# Patient Record
Sex: Male | Born: 1955 | Race: White | Hispanic: No | Marital: Married | Smoking: Never smoker
Health system: Southern US, Community
[De-identification: ages and names within clinical notes are randomized; demographics above are authoritative.]

## PROBLEM LIST (undated history)

## (undated) DIAGNOSIS — T7840XA Allergy, unspecified, initial encounter: Secondary | ICD-10-CM

## (undated) DIAGNOSIS — C801 Malignant (primary) neoplasm, unspecified: Secondary | ICD-10-CM

## (undated) DIAGNOSIS — E785 Hyperlipidemia, unspecified: Secondary | ICD-10-CM

## (undated) DIAGNOSIS — T8859XA Other complications of anesthesia, initial encounter: Secondary | ICD-10-CM

## (undated) DIAGNOSIS — H9191 Unspecified hearing loss, right ear: Secondary | ICD-10-CM

## (undated) DIAGNOSIS — M5137 Other intervertebral disc degeneration, lumbosacral region: Secondary | ICD-10-CM

## (undated) DIAGNOSIS — K219 Gastro-esophageal reflux disease without esophagitis: Secondary | ICD-10-CM

## (undated) DIAGNOSIS — G8929 Other chronic pain: Secondary | ICD-10-CM

## (undated) DIAGNOSIS — T4145XA Adverse effect of unspecified anesthetic, initial encounter: Secondary | ICD-10-CM

## (undated) DIAGNOSIS — R011 Cardiac murmur, unspecified: Secondary | ICD-10-CM

## (undated) DIAGNOSIS — Z85828 Personal history of other malignant neoplasm of skin: Secondary | ICD-10-CM

## (undated) DIAGNOSIS — M1990 Unspecified osteoarthritis, unspecified site: Secondary | ICD-10-CM

## (undated) DIAGNOSIS — N401 Enlarged prostate with lower urinary tract symptoms: Secondary | ICD-10-CM

## (undated) DIAGNOSIS — H9190 Unspecified hearing loss, unspecified ear: Secondary | ICD-10-CM

## (undated) DIAGNOSIS — I499 Cardiac arrhythmia, unspecified: Secondary | ICD-10-CM

## (undated) DIAGNOSIS — I10 Essential (primary) hypertension: Secondary | ICD-10-CM

## (undated) DIAGNOSIS — M545 Low back pain, unspecified: Secondary | ICD-10-CM

## (undated) DIAGNOSIS — J302 Other seasonal allergic rhinitis: Secondary | ICD-10-CM

## (undated) DIAGNOSIS — E782 Mixed hyperlipidemia: Secondary | ICD-10-CM

## (undated) DIAGNOSIS — Z87442 Personal history of urinary calculi: Secondary | ICD-10-CM

## (undated) DIAGNOSIS — E876 Hypokalemia: Secondary | ICD-10-CM

## (undated) DIAGNOSIS — M2550 Pain in unspecified joint: Secondary | ICD-10-CM

## (undated) DIAGNOSIS — M48062 Spinal stenosis, lumbar region with neurogenic claudication: Secondary | ICD-10-CM

## (undated) DIAGNOSIS — Z7901 Long term (current) use of anticoagulants: Secondary | ICD-10-CM

## (undated) DIAGNOSIS — N1330 Unspecified hydronephrosis: Secondary | ICD-10-CM

## (undated) DIAGNOSIS — M4807 Spinal stenosis, lumbosacral region: Secondary | ICD-10-CM

## (undated) DIAGNOSIS — M51379 Other intervertebral disc degeneration, lumbosacral region without mention of lumbar back pain or lower extremity pain: Secondary | ICD-10-CM

## (undated) HISTORY — DX: Hypokalemia: E87.6

## (undated) HISTORY — PX: LITHOTRIPSY: SUR834

## (undated) HISTORY — PX: JOINT REPLACEMENT: SHX530

## (undated) HISTORY — PX: COLONOSCOPY: SHX5424

## (undated) HISTORY — PX: EXTRACORPOREAL SHOCK WAVE LITHOTRIPSY: SHX1557

## (undated) HISTORY — DX: Spinal stenosis, lumbar region with neurogenic claudication: M48.062

## (undated) HISTORY — PX: LUMBAR DISC SURGERY: SHX700

## (undated) HISTORY — PX: HERNIA REPAIR: SHX51

## (undated) HISTORY — DX: Allergy, unspecified, initial encounter: T78.40XA

## (undated) HISTORY — DX: Hyperlipidemia, unspecified: E78.5

## (undated) HISTORY — DX: Pain in unspecified joint: M25.50

## (undated) HISTORY — PX: BACK SURGERY: SHX140

---
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
## 1990-10-24 HISTORY — PX: UMBILICAL HERNIA REPAIR: SHX196

## 2006-01-02 ENCOUNTER — Other Ambulatory Visit: Payer: Self-pay

## 2006-01-05 ENCOUNTER — Ambulatory Visit: Payer: Self-pay | Admitting: Orthopaedic Surgery

## 2006-09-05 ENCOUNTER — Ambulatory Visit: Payer: Self-pay | Admitting: Family Medicine

## 2006-12-04 ENCOUNTER — Ambulatory Visit: Payer: Self-pay | Admitting: Orthopaedic Surgery

## 2007-03-16 ENCOUNTER — Ambulatory Visit: Payer: Self-pay | Admitting: Family Medicine

## 2007-06-13 ENCOUNTER — Ambulatory Visit: Payer: Self-pay | Admitting: Orthopaedic Surgery

## 2007-07-09 ENCOUNTER — Ambulatory Visit: Payer: Self-pay | Admitting: Orthopaedic Surgery

## 2007-07-12 ENCOUNTER — Ambulatory Visit: Payer: Self-pay | Admitting: Orthopaedic Surgery

## 2007-12-12 ENCOUNTER — Ambulatory Visit: Payer: Self-pay

## 2008-01-02 ENCOUNTER — Other Ambulatory Visit: Payer: Self-pay

## 2008-01-02 ENCOUNTER — Ambulatory Visit: Payer: Self-pay | Admitting: Orthopaedic Surgery

## 2008-01-08 ENCOUNTER — Ambulatory Visit: Payer: Self-pay | Admitting: Orthopaedic Surgery

## 2008-06-02 ENCOUNTER — Ambulatory Visit: Payer: Self-pay | Admitting: Family Medicine

## 2008-09-09 ENCOUNTER — Ambulatory Visit: Payer: Self-pay | Admitting: Unknown Physician Specialty

## 2008-11-03 ENCOUNTER — Ambulatory Visit: Payer: Self-pay | Admitting: General Practice

## 2008-11-18 ENCOUNTER — Inpatient Hospital Stay: Payer: Self-pay | Admitting: General Practice

## 2009-10-09 ENCOUNTER — Ambulatory Visit: Payer: Self-pay | Admitting: General Practice

## 2010-04-05 ENCOUNTER — Ambulatory Visit: Payer: Self-pay | Admitting: Pain Medicine

## 2010-04-13 ENCOUNTER — Ambulatory Visit: Payer: Self-pay | Admitting: Pain Medicine

## 2010-04-29 ENCOUNTER — Ambulatory Visit: Payer: Self-pay | Admitting: Pain Medicine

## 2010-05-17 ENCOUNTER — Ambulatory Visit: Payer: Self-pay | Admitting: Pain Medicine

## 2010-05-26 ENCOUNTER — Ambulatory Visit: Payer: Self-pay | Admitting: Pain Medicine

## 2010-06-14 ENCOUNTER — Ambulatory Visit: Payer: Self-pay | Admitting: Pain Medicine

## 2010-06-15 ENCOUNTER — Ambulatory Visit: Payer: Self-pay | Admitting: Pain Medicine

## 2010-06-30 ENCOUNTER — Ambulatory Visit: Payer: Self-pay | Admitting: Pain Medicine

## 2010-07-01 ENCOUNTER — Ambulatory Visit: Payer: Self-pay | Admitting: Pain Medicine

## 2010-07-15 ENCOUNTER — Other Ambulatory Visit: Payer: Self-pay

## 2010-07-20 ENCOUNTER — Ambulatory Visit: Payer: Self-pay | Admitting: Pain Medicine

## 2010-10-09 ENCOUNTER — Ambulatory Visit: Payer: Self-pay | Admitting: Internal Medicine

## 2010-10-24 HISTORY — PX: TOTAL KNEE ARTHROPLASTY: SHX125

## 2011-02-19 ENCOUNTER — Inpatient Hospital Stay: Payer: Self-pay | Admitting: *Deleted

## 2011-02-28 ENCOUNTER — Ambulatory Visit: Payer: Self-pay | Admitting: Internal Medicine

## 2013-04-29 ENCOUNTER — Ambulatory Visit: Payer: Self-pay

## 2013-07-26 ENCOUNTER — Emergency Department: Payer: Self-pay | Admitting: Emergency Medicine

## 2013-10-24 HISTORY — PX: REVISION TOTAL KNEE ARTHROPLASTY: SUR1280

## 2013-12-04 ENCOUNTER — Ambulatory Visit: Payer: Self-pay | Admitting: Surgery

## 2013-12-04 LAB — CREATININE, SERUM
CREATININE: 1.31 mg/dL — AB (ref 0.60–1.30)
EGFR (African American): >60
GFR CALC NON AF AMER: 59 — AB

## 2013-12-15 DIAGNOSIS — M19049 Primary osteoarthritis, unspecified hand: Secondary | ICD-10-CM | POA: Insufficient documentation

## 2014-06-13 ENCOUNTER — Ambulatory Visit: Payer: Self-pay | Admitting: Unknown Physician Specialty

## 2014-06-16 LAB — PATHOLOGY REPORT

## 2015-04-30 DIAGNOSIS — E782 Mixed hyperlipidemia: Secondary | ICD-10-CM | POA: Insufficient documentation

## 2015-05-01 ENCOUNTER — Other Ambulatory Visit: Payer: Self-pay

## 2015-05-01 DIAGNOSIS — R066 Hiccough: Secondary | ICD-10-CM

## 2015-05-01 MED ORDER — SUCRALFATE 1 G PO TABS: 1.0000 g | ORAL_TABLET | Freq: Four times a day (QID) | ORAL | Status: DC

## 2015-05-26 ENCOUNTER — Other Ambulatory Visit: Payer: Self-pay | Admitting: Family Medicine

## 2015-05-26 DIAGNOSIS — J302 Other seasonal allergic rhinitis: Secondary | ICD-10-CM

## 2015-06-10 DIAGNOSIS — M7742 Metatarsalgia, left foot: Secondary | ICD-10-CM | POA: Insufficient documentation

## 2015-06-14 ENCOUNTER — Ambulatory Visit
Admission: EM | Admit: 2015-06-14 | Discharge: 2015-06-14 | Disposition: A | Discharge status: Home / Self Care | Payer: BC Managed Care – PPO | Attending: Family Medicine | Admitting: Family Medicine

## 2015-06-14 DIAGNOSIS — J189 Pneumonia, unspecified organism: Secondary | ICD-10-CM

## 2015-06-14 HISTORY — DX: Essential (primary) hypertension: I10

## 2015-06-14 HISTORY — DX: Unspecified osteoarthritis, unspecified site: M19.90

## 2015-06-14 MED ORDER — HYDROCOD POLST-CPM POLST ER 10-8 MG/5ML PO SUER: 5.0000 mL | Freq: Every evening | ORAL | Status: DC | PRN

## 2015-06-14 MED ORDER — LEVOFLOXACIN 500 MG PO TABS: 500.0000 mg | ORAL_TABLET | Freq: Every day | ORAL | Status: DC

## 2015-06-14 NOTE — ED Notes (Signed)
Pt has been sick with cough for the past few days. Having lots of chest congestion. Weak and tired.

## 2015-06-14 NOTE — ED Provider Notes (Signed)
CSN: [id number]     Arrival date & time 06/14/15  1247 History   First MD Initiated Contact with Patient 06/14/15 1418     Chief Complaint  Patient presents with  . Cough  . Fever   (Consider location/radiation/quality/duration/timing/severity/associated sxs/prior Treatment) Patient is a 59 y.o. male presenting with URI. The history is provided by the patient.  URI Presenting symptoms: congestion, cough and fever   Severity:  Moderate Onset quality:  Sudden Duration:  1 week Timing:  Constant Progression:  Worsening Chronicity:  New Relieved by:  Nothing   Past Medical History  Diagnosis Date  . Hypertension   . Arthritis    Past Surgical History  Procedure Laterality Date  . Joint replacement     History reviewed. No pertinent family history. Social History  Substance Use Topics  . Smoking status: Never Smoker   . Smokeless tobacco: Never Used  . Alcohol Use: No    Review of Systems  Constitutional: Positive for fever.  HENT: Positive for congestion.   Respiratory: Positive for cough.     Allergies  Mobic  Home Medications   Prior to Admission medications   Medication Sig Start Date End Date Taking? Authorizing Provider  aspirin 325 MG tablet Take 325 mg by mouth daily.   Yes Historical Provider, MD  celecoxib (CELEBREX) 200 MG capsule Take 200 mg by mouth 2 (two) times daily.   Yes Historical Provider, MD  fexofenadine (ALLEGRA) 180 MG tablet Take 180 mg by mouth daily.   Yes Historical Provider, MD  labetalol (NORMODYNE) 200 MG tablet Take 200 mg by mouth 2 (two) times daily.   Yes Historical Provider, MD  lisinopril (PRINIVIL,ZESTRIL) 20 MG tablet Take 20 mg by mouth daily.   Yes Historical Provider, MD  montelukast (SINGULAIR) 10 MG tablet Take 10 mg by mouth at bedtime.   Yes Historical Provider, MD  potassium chloride (K-DUR,KLOR-CON) 10 MEQ tablet Take 10 mEq by mouth 2 (two) times daily.   Yes Historical Provider, MD  spironolactone (ALDACTONE) 25 MG  tablet Take 25 mg by mouth daily.   Yes Historical Provider, MD  chlorpheniramine-HYDROcodone (TUSSIONEX PENNKINETIC ER) 10-8 MG/5ML SUER Take 5 mLs by mouth at bedtime as needed for cough. 06/14/15   Norval Gable, MD  fluticasone (FLONASE) 50 MCG/ACT nasal spray ONE SPRAY IN NOSTRIL ONCE DAILY AS DIRECTED BY PHYSICIAN 05/26/15   Juline Patch, MD  levofloxacin (LEVAQUIN) 500 MG tablet Take 1 tablet (500 mg total) by mouth daily. 06/14/15   Norval Gable, MD  sucralfate (CARAFATE) 1 G tablet Take 1 tablet (1 g total) by mouth 4 (four) times daily. 05/01/15   Juline Patch, MD   BP 133/100 mmHg  Pulse 81  Temp(Src) 98.3 F (36.8 C) (Tympanic)  Resp 20  Ht 6\' 1"  (1.854 m)  Wt 238 lb (107.956 kg)  BMI 31.41 kg/m2  SpO2 96% Physical Exam  Constitutional: He is oriented to person, place, and time. He appears well-developed and well-nourished. No distress.  HENT:  Right Ear: External ear normal.  Left Ear: External ear normal.  Nose: Nose normal.  Mouth/Throat: Oropharynx is clear and moist. No oropharyngeal exudate.  Eyes: Conjunctivae are normal. Right eye exhibits no discharge.  Neck: Normal range of motion. Neck supple.  Cardiovascular: Normal rate, regular rhythm and normal heart sounds.   Pulmonary/Chest: Effort normal. He has rales (left base).  Musculoskeletal: He exhibits no edema.  Lymphadenopathy:    He has no cervical adenopathy.  Neurological: He is  alert and oriented to person, place, and time.  Skin: No rash noted. He is not diaphoretic.  Nursing note and vitals reviewed.   ED Course  Procedures (including critical care time) Labs Review Labs Reviewed - No data to display  Imaging Review No results found.   MDM   1. Community acquired pneumonia    Discharge Medication List as of 06/14/2015  2:36 PM    START taking these medications   Details  chlorpheniramine-HYDROcodone (TUSSIONEX PENNKINETIC ER) 10-8 MG/5ML SUER Take 5 mLs by mouth at bedtime as needed for  cough., Starting 06/14/2015, Until Discontinued, Normal    levofloxacin (LEVAQUIN) 500 MG tablet Take 1 tablet (500 mg total) by mouth daily., Starting 06/14/2015, Until Discontinued, Normal      Plan: 1.  diagnosis reviewed with patient 2. rx as per orders; risks, benefits, potential side effects reviewed with patient 3. Recommend supportive treatment with rest, increased fluids 4. F/u prn if symptoms worsen or don't improve    Norval Gable, MD 06/15/15 1500

## 2015-06-19 ENCOUNTER — Ambulatory Visit
Admission: RE | Admit: 2015-06-19 | Discharge: 2015-06-19 | Disposition: A | Discharge status: Home / Self Care | Payer: BC Managed Care – PPO | Source: Ambulatory Visit | Attending: Family Medicine | Admitting: Family Medicine

## 2015-06-19 ENCOUNTER — Encounter: Payer: Self-pay | Admitting: Family Medicine

## 2015-06-19 ENCOUNTER — Ambulatory Visit (INDEPENDENT_AMBULATORY_CARE_PROVIDER_SITE_OTHER): Payer: BC Managed Care – PPO | Admitting: Family Medicine

## 2015-06-19 VITALS — BP 124/80 | HR 72 | Temp 98.8°F | Ht 73.0 in | Wt 231.0 lb

## 2015-06-19 DIAGNOSIS — J4520 Mild intermittent asthma, uncomplicated: Secondary | ICD-10-CM | POA: Diagnosis not present

## 2015-06-19 DIAGNOSIS — J301 Allergic rhinitis due to pollen: Secondary | ICD-10-CM

## 2015-06-19 DIAGNOSIS — J189 Pneumonia, unspecified organism: Secondary | ICD-10-CM | POA: Insufficient documentation

## 2015-06-19 MED ORDER — PREDNISONE 10 MG PO TABS: 10.0000 mg | ORAL_TABLET | Freq: Every day | ORAL | Status: DC

## 2015-06-19 MED ORDER — AZITHROMYCIN 250 MG PO TABS: ORAL_TABLET | ORAL | Status: DC

## 2015-06-19 MED ORDER — ALBUTEROL SULFATE HFA 108 (90 BASE) MCG/ACT IN AERS: 2.0000 | INHALATION_SPRAY | Freq: Four times a day (QID) | RESPIRATORY_TRACT | Status: DC | PRN

## 2015-06-19 NOTE — Progress Notes (Signed)
Name: [name]   MRN: [id number]    DOB: [birth year]   Date:06/19/2015       Progress Note  Subjective  Chief Complaint  Chief Complaint  Patient presents with  . Follow-up    [facility]- heard pneumonia in L) lung- tomorrow is last day of Levaquin and cough syrup    Cough This is a new problem. The current episode started in the past 7 days. The problem has been unchanged. The problem occurs constantly. The cough is productive of purulent sputum. Associated symptoms include chest pain, a fever, nasal congestion, postnasal drip, shortness of breath and wheezing. Pertinent negatives include no chills, headaches, heartburn or sore throat. The symptoms are aggravated by pollens. Risk factors for lung disease include travel. He has tried prescription cough suppressant (levaquin 500mg ) for the symptoms. The treatment provided no relief. His past medical history is significant for asthma. There is no history of bronchiectasis, bronchitis, COPD, emphysema, environmental allergies or pneumonia.    No problem-specific assessment & plan notes found for this encounter.   Past Medical History  Diagnosis Date  . Hypertension   . Arthritis   . Allergy   . Hyperlipidemia   . Hypokalemia   . Joint pain     Past Surgical History  Procedure Laterality Date  . Joint replacement      Family History  Problem Relation Age of Onset  . Cancer Mother   . Cancer Father   . Cancer Brother   . Hyperlipidemia Brother     Social History   Social History  . Marital Status: Married    Spouse Name: N/A  . Number of Children: N/A  . Years of Education: N/A   Occupational History  . Not on file.   Social History Main Topics  . Smoking status: Never Smoker   . Smokeless tobacco: Never Used  . Alcohol Use: No  . Drug Use: Not on file  . Sexual Activity: Yes   Other Topics Concern  . Not on file   Social History Narrative    Allergies  Allergen Reactions  . Mobic [Meloxicam] Palpitations      Review of Systems  Constitutional: Positive for fever. Negative for chills.  HENT: Positive for postnasal drip. Negative for sore throat.   Respiratory: Positive for cough, shortness of breath and wheezing.   Cardiovascular: Positive for chest pain.  Gastrointestinal: Negative for heartburn.  Neurological: Negative for headaches.  Endo/Heme/Allergies: Negative for environmental allergies.     Objective  Filed Vitals:   06/19/15 1008  BP: 124/80  Pulse: 72  Temp: 98.8 F (37.1 C)  Height: 6\' 1"  (1.854 m)  Weight: 231 lb (104.781 kg)    Physical Exam    Assessment & Plan  Problem List Items Addressed This Visit    None    Visit Diagnoses    Pneumonia, organism unspecified    -  Primary    partially treated    Relevant Medications    azithromycin (ZITHROMAX) 250 MG tablet    albuterol (PROVENTIL HFA;VENTOLIN HFA) 108 (90 BASE) MCG/ACT inhaler    Other Relevant Orders    DG Chest 2 View    Reactive airway disease, mild intermittent, uncomplicated        Relevant Medications    predniSONE (DELTASONE) 10 MG tablet    albuterol (PROVENTIL HFA;VENTOLIN HFA) 108 (90 BASE) MCG/ACT inhaler    Allergic rhinitis due to pollen  Dr. [facility]  06/19/2015

## 2015-06-29 ENCOUNTER — Other Ambulatory Visit: Payer: Self-pay | Admitting: Family Medicine

## 2015-06-29 DIAGNOSIS — E876 Hypokalemia: Secondary | ICD-10-CM

## 2015-07-01 ENCOUNTER — Ambulatory Visit (INDEPENDENT_AMBULATORY_CARE_PROVIDER_SITE_OTHER): Payer: BC Managed Care – PPO | Admitting: Family Medicine

## 2015-07-01 ENCOUNTER — Encounter: Payer: Self-pay | Admitting: Family Medicine

## 2015-07-01 VITALS — BP 140/96 | HR 80 | Temp 98.4°F | Ht 73.0 in | Wt 234.0 lb

## 2015-07-01 DIAGNOSIS — J309 Allergic rhinitis, unspecified: Secondary | ICD-10-CM | POA: Insufficient documentation

## 2015-07-01 DIAGNOSIS — J45909 Unspecified asthma, uncomplicated: Secondary | ICD-10-CM | POA: Insufficient documentation

## 2015-07-01 DIAGNOSIS — J40 Bronchitis, not specified as acute or chronic: Secondary | ICD-10-CM | POA: Diagnosis not present

## 2015-07-01 DIAGNOSIS — I10 Essential (primary) hypertension: Secondary | ICD-10-CM | POA: Insufficient documentation

## 2015-07-01 DIAGNOSIS — I38 Endocarditis, valve unspecified: Secondary | ICD-10-CM

## 2015-07-01 DIAGNOSIS — J4531 Mild persistent asthma with (acute) exacerbation: Secondary | ICD-10-CM

## 2015-07-01 MED ORDER — HYDROCOD POLST-CPM POLST ER 10-8 MG/5ML PO SUER: 5.0000 mL | Freq: Two times a day (BID) | ORAL | Status: DC

## 2015-07-01 MED ORDER — LEVOFLOXACIN 500 MG PO TABS: 500.0000 mg | ORAL_TABLET | Freq: Every day | ORAL | Status: DC

## 2015-07-01 NOTE — Progress Notes (Signed)
Name: [name]   MRN: [id number]    DOB: 1956-10-11   Date:07/01/2015       Progress Note  Subjective  Chief Complaint  Chief Complaint  Patient presents with  . Sinusitis    completed course of ZPAck- still taking 10mg  prednisone daily and still has cough with light green production, congestion and stuffy nose    Sinusitis This is a recurrent problem. The current episode started 1 to 4 weeks ago. The problem has been waxing and waning since onset. There has been no fever. Associated symptoms include congestion, coughing, a hoarse voice and sneezing. Pertinent negatives include no chills, diaphoresis, ear pain, headaches, neck pain, shortness of breath, sinus pressure, sore throat or swollen glands. Past treatments include antibiotics (singulair/alledra/flucticasone/levaquin/azith). The treatment provided no relief.    No problem-specific assessment & plan notes found for this encounter.   Past Medical History  Diagnosis Date  . Hypertension   . Arthritis   . Allergy   . Hyperlipidemia   . Hypokalemia   . Joint pain     Past Surgical History  Procedure Laterality Date  . Joint replacement      Family History  Problem Relation Age of Onset  . Cancer Mother   . Cancer Father   . Cancer Brother   . Hyperlipidemia Brother     Social History   Social History  . Marital Status: Married    Spouse Name: N/A  . Number of Children: N/A  . Years of Education: N/A   Occupational History  . Not on file.   Social History Main Topics  . Smoking status: Never Smoker   . Smokeless tobacco: Never Used  . Alcohol Use: No  . Drug Use: Not on file  . Sexual Activity: Yes   Other Topics Concern  . Not on file   Social History Narrative    Allergies  Allergen Reactions  . Mobic [Meloxicam] Palpitations     Review of Systems  Constitutional: Negative for fever, chills, weight loss, malaise/fatigue and diaphoresis.  HENT: Positive for congestion, hoarse voice  and sneezing. Negative for ear discharge, ear pain, sinus pressure and sore throat.   Eyes: Negative for blurred vision.  Respiratory: Positive for cough. Negative for sputum production, shortness of breath and wheezing.   Cardiovascular: Negative for chest pain, palpitations and leg swelling.  Gastrointestinal: Negative for heartburn, nausea, abdominal pain, diarrhea, constipation, blood in stool and melena.  Genitourinary: Negative for dysuria, urgency, frequency and hematuria.  Musculoskeletal: Negative for myalgias, back pain, joint pain and neck pain.  Skin: Negative for rash.  Neurological: Negative for dizziness, tingling, sensory change, focal weakness and headaches.  Endo/Heme/Allergies: Negative for environmental allergies and polydipsia. Does not bruise/bleed easily.  Psychiatric/Behavioral: Negative for depression and suicidal ideas. The patient is not nervous/anxious and does not have insomnia.      Objective  Filed Vitals:   07/01/15 1116  BP: 140/96  Pulse: 80  Temp: 98.4 F (36.9 C)  TempSrc: Oral  Height: 6\' 1"  (1.854 m)  Weight: 234 lb (106.142 kg)  SpO2: 98%    Physical Exam  Constitutional: He is oriented to person, place, and time and well-developed, well-nourished, and in no distress.  HENT:  Head: Normocephalic.  Right Ear: External ear normal.  Left Ear: External ear normal.  Nose: Nose normal.  Mouth/Throat: Oropharynx is clear and moist.  Eyes: Conjunctivae and EOM are normal. Pupils are equal, round, and reactive to light. Right eye exhibits no discharge.  Left eye exhibits no discharge. No scleral icterus.  Neck: Normal range of motion. Neck supple. No JVD present. No tracheal deviation present. No thyromegaly present.  Cardiovascular: Normal rate, regular rhythm, normal heart sounds and intact distal pulses.  Exam reveals no gallop and no friction rub.   No murmur heard. Pulmonary/Chest: Breath sounds normal. No respiratory distress. He has no  wheezes. He has no rales.  Abdominal: Soft. Bowel sounds are normal. He exhibits no mass. There is no hepatosplenomegaly. There is no tenderness. There is no rebound, no guarding and no CVA tenderness.  Musculoskeletal: Normal range of motion. He exhibits no edema or tenderness.  Lymphadenopathy:    He has no cervical adenopathy.  Neurological: He is alert and oriented to person, place, and time. He has normal sensation, normal strength, normal reflexes and intact cranial nerves. No cranial nerve deficit.  Skin: Skin is warm. No rash noted.  Psychiatric: Mood and affect normal.      Assessment & Plan  Problem List Items Addressed This Visit      Cardiovascular and Mediastinum   Essential hypertension   Valvular heart disease     Respiratory   Bronchitis - Primary   Relevant Medications   levofloxacin (LEVAQUIN) 500 MG tablet   chlorpheniramine-HYDROcodone (TUSSIONEX PENNKINETIC ER) 10-8 MG/5ML SUER   Reactive airway disease   Allergic sinusitis   Relevant Medications   levofloxacin (LEVAQUIN) 500 MG tablet   chlorpheniramine-HYDROcodone (TUSSIONEX PENNKINETIC ER) 10-8 MG/5ML SUER        Dr. Gwenette Wellons [facility]  07/01/2015

## 2015-07-01 NOTE — Patient Instructions (Signed)

## 2015-07-14 ENCOUNTER — Ambulatory Visit: Payer: BC Managed Care – PPO | Admitting: Family Medicine

## 2015-07-28 ENCOUNTER — Other Ambulatory Visit: Payer: Self-pay | Admitting: Family Medicine

## 2015-07-28 ENCOUNTER — Ambulatory Visit: Payer: BC Managed Care – PPO | Admitting: Family Medicine

## 2015-07-28 DIAGNOSIS — J4530 Mild persistent asthma, uncomplicated: Secondary | ICD-10-CM

## 2015-07-30 ENCOUNTER — Encounter: Payer: Self-pay | Admitting: Family Medicine

## 2015-07-30 ENCOUNTER — Ambulatory Visit (INDEPENDENT_AMBULATORY_CARE_PROVIDER_SITE_OTHER): Payer: BC Managed Care – PPO | Admitting: Family Medicine

## 2015-07-30 VITALS — BP 120/64 | HR 76 | Ht 73.0 in | Wt 240.0 lb

## 2015-07-30 DIAGNOSIS — J309 Allergic rhinitis, unspecified: Secondary | ICD-10-CM

## 2015-07-30 DIAGNOSIS — R69 Illness, unspecified: Secondary | ICD-10-CM | POA: Diagnosis not present

## 2015-07-30 DIAGNOSIS — J4531 Mild persistent asthma with (acute) exacerbation: Secondary | ICD-10-CM | POA: Diagnosis not present

## 2015-07-30 DIAGNOSIS — Z23 Encounter for immunization: Secondary | ICD-10-CM | POA: Diagnosis not present

## 2015-07-30 NOTE — Progress Notes (Signed)
Name: RANFERI CLINGAN   MRN: [id number]    DOB: 02-13-1956   Date:07/30/2015       Progress Note  Subjective  Chief Complaint  Chief Complaint  Patient presents with  . Cough    better- no congestion    Cough This is a chronic problem. The current episode started more than 1 month ago. The problem has been gradually improving. The cough is non-productive. Pertinent negatives include no chest pain, chills, ear congestion, ear pain, fever, headaches, heartburn, hemoptysis, myalgias, nasal congestion, postnasal drip, rash, rhinorrhea, sore throat, shortness of breath, sweats, weight loss or wheezing. The symptoms are aggravated by pollens. Risk factors for lung disease include occupational exposure. He has tried a beta-agonist inhaler for the symptoms. There is no history of asthma, bronchiectasis, COPD, environmental allergies or pneumonia.    No problem-specific assessment & plan notes found for this encounter.   Past Medical History  Diagnosis Date  . Hypertension   . Arthritis   . Allergy   . Hyperlipidemia   . Hypokalemia   . Joint pain     Past Surgical History  Procedure Laterality Date  . Joint replacement      Family History  Problem Relation Age of Onset  . Cancer Mother   . Cancer Father   . Cancer Brother   . Hyperlipidemia Brother     Social History   Social History  . Marital Status: Married    Spouse Name: N/A  . Number of Children: N/A  . Years of Education: N/A   Occupational History  . Not on file.   Social History Main Topics  . Smoking status: Never Smoker   . Smokeless tobacco: Never Used  . Alcohol Use: No  . Drug Use: Not on file  . Sexual Activity: Yes   Other Topics Concern  . Not on file   Social History Narrative    Allergies  Allergen Reactions  . Mobic [Meloxicam] Palpitations     Review of Systems  Constitutional: Negative for fever, chills, weight loss and malaise/fatigue.  HENT: Negative for ear discharge, ear  pain, postnasal drip, rhinorrhea and sore throat.   Eyes: Negative for blurred vision.  Respiratory: Positive for cough. Negative for hemoptysis, sputum production, shortness of breath and wheezing.   Cardiovascular: Negative for chest pain, palpitations and leg swelling.  Gastrointestinal: Negative for heartburn, nausea, abdominal pain, diarrhea, constipation, blood in stool and melena.  Genitourinary: Negative for dysuria, urgency, frequency and hematuria.  Musculoskeletal: Negative for myalgias, back pain, joint pain and neck pain.  Skin: Negative for rash.  Neurological: Negative for dizziness, tingling, sensory change, focal weakness and headaches.  Endo/Heme/Allergies: Negative for environmental allergies and polydipsia. Does not bruise/bleed easily.  Psychiatric/Behavioral: Negative for depression and suicidal ideas. The patient is not nervous/anxious and does not have insomnia.      Objective  Filed Vitals:   07/30/15 0758  BP: 120/64  Pulse: 76  Height: 6\' 1"  (1.854 m)  Weight: 240 lb (108.863 kg)    Physical Exam  Constitutional: He is oriented to person, place, and time and well-developed, well-nourished, and in no distress.  HENT:  Head: Normocephalic.  Right Ear: External ear normal.  Left Ear: External ear normal.  Nose: Nose normal.  Mouth/Throat: Oropharynx is clear and moist.  Eyes: Conjunctivae and EOM are normal. Pupils are equal, round, and reactive to light. Right eye exhibits no discharge. Left eye exhibits no discharge. No scleral icterus.  Neck: Normal range of motion.  Neck supple. No JVD present. No tracheal deviation present. No thyromegaly present.  Cardiovascular: Normal rate, regular rhythm, normal heart sounds and intact distal pulses.  Exam reveals no gallop and no friction rub.   No murmur heard. Pulmonary/Chest: Breath sounds normal. No respiratory distress. He has no wheezes. He has no rales.  Abdominal: Soft. Bowel sounds are normal. There is no  hepatosplenomegaly. There is no CVA tenderness.  Musculoskeletal: Normal range of motion.  Lymphadenopathy:    He has no cervical adenopathy.  Neurological: He is alert and oriented to person, place, and time. He has normal sensation, normal strength and intact cranial nerves.  Skin: Skin is warm.  Psychiatric: Mood and affect normal.      Assessment & Plan  Problem List Items Addressed This Visit      Respiratory   Reactive airway disease - Primary   Allergic sinusitis     Other   Taking multiple medications for chronic disease   Relevant Orders   Renal Function Panel        Dr. Otilio Miu [facility]  07/30/2015

## 2015-07-30 NOTE — Addendum Note (Signed)
Addended by: Fredderick Severance on: 07/30/2015 11:28 AM   Modules accepted: Orders

## 2015-07-31 LAB — RENAL FUNCTION PANEL
Albumin: 4.2 g/dL (ref 3.6–4.8)
BUN / CREAT RATIO: 14 (ref 10–22)
BUN: 14 mg/dL (ref 8–27)
CO2: 26 mmol/L (ref 18–29)
CREATININE: 1.02 mg/dL (ref 0.76–1.27)
Calcium: 9.7 mg/dL (ref 8.6–10.2)
Chloride: 102 mmol/L (ref 97–108)
GFR, EST AFRICAN AMERICAN: 91 mL/min/{1.73_m2} (ref >59)
GFR, EST NON AFRICAN AMERICAN: 79 mL/min/{1.73_m2} (ref >59)
Glucose: 100 mg/dL — ABNORMAL HIGH (ref 65–99)
Phosphorus: 4.1 mg/dL (ref 2.5–4.5)
Potassium: 4.7 mmol/L (ref 3.5–5.2)
SODIUM: 142 mmol/L (ref 134–144)

## 2015-08-06 ENCOUNTER — Other Ambulatory Visit: Payer: Self-pay | Admitting: Family Medicine

## 2015-08-06 DIAGNOSIS — E876 Hypokalemia: Secondary | ICD-10-CM

## 2015-09-08 ENCOUNTER — Other Ambulatory Visit: Payer: Self-pay | Admitting: Family Medicine

## 2015-10-05 ENCOUNTER — Other Ambulatory Visit: Payer: Self-pay | Admitting: Family Medicine

## 2015-12-14 ENCOUNTER — Other Ambulatory Visit: Payer: Self-pay

## 2015-12-14 DIAGNOSIS — J302 Other seasonal allergic rhinitis: Secondary | ICD-10-CM

## 2015-12-14 MED ORDER — PREDNISONE 10 MG PO TABS: 10.0000 mg | ORAL_TABLET | Freq: Every day | ORAL | Status: DC

## 2016-01-04 ENCOUNTER — Other Ambulatory Visit: Payer: Self-pay | Admitting: Family Medicine

## 2016-02-02 ENCOUNTER — Other Ambulatory Visit: Payer: Self-pay | Admitting: Family Medicine

## 2016-02-11 ENCOUNTER — Ambulatory Visit (INDEPENDENT_AMBULATORY_CARE_PROVIDER_SITE_OTHER): Payer: BC Managed Care – PPO | Admitting: Family Medicine

## 2016-02-11 ENCOUNTER — Encounter: Payer: Self-pay | Admitting: Family Medicine

## 2016-02-11 VITALS — BP 120/84 | HR 64 | Ht 73.0 in | Wt 235.0 lb

## 2016-02-11 DIAGNOSIS — B029 Zoster without complications: Secondary | ICD-10-CM

## 2016-02-11 MED ORDER — PREDNISONE 10 MG PO TABS: ORAL_TABLET | ORAL | Status: DC

## 2016-02-11 MED ORDER — HYDROCODONE-ACETAMINOPHEN 5-325 MG PO TABS: 1.0000 | ORAL_TABLET | Freq: Four times a day (QID) | ORAL | Status: DC | PRN

## 2016-02-11 MED ORDER — VALACYCLOVIR HCL 1 G PO TABS: 1000.0000 mg | ORAL_TABLET | Freq: Three times a day (TID) | ORAL | Status: DC

## 2016-02-11 NOTE — Progress Notes (Signed)
Name: [name]   MRN: [id number]    DOB: November 06, 1955   Date:02/11/2016       Progress Note  Subjective  Chief Complaint  Chief Complaint  Patient presents with  . Rash    had shingles the beginning of last year. Had aching in L) side and then a small bump came up in that area    Rash This is a new problem. The current episode started in the past 7 days. The problem has been waxing and waning since onset. The affected locations include the torso. The rash is characterized by blistering and pain. He was exposed to nothing. Pertinent negatives include no anorexia, congestion, cough, diarrhea, eye pain, facial edema, fatigue, fever, joint pain, nail changes, rhinorrhea, shortness of breath, sore throat or vomiting. Past treatments include nothing. The treatment provided no relief.    No problem-specific assessment & plan notes found for this encounter.   Past Medical History  Diagnosis Date  . Hypertension   . Arthritis   . Allergy   . Hyperlipidemia   . Hypokalemia   . Joint pain     Past Surgical History  Procedure Laterality Date  . Joint replacement      Family History  Problem Relation Age of Onset  . Cancer Mother   . Cancer Father   . Cancer Brother   . Hyperlipidemia Brother     Social History   Social History  . Marital Status: Married    Spouse Name: N/A  . Number of Children: N/A  . Years of Education: N/A   Occupational History  . Not on file.   Social History Main Topics  . Smoking status: Never Smoker   . Smokeless tobacco: Never Used  . Alcohol Use: No  . Drug Use: Not on file  . Sexual Activity: Yes   Other Topics Concern  . Not on file   Social History Narrative    Allergies  Allergen Reactions  . Mobic [Meloxicam] Palpitations     Review of Systems  Constitutional: Negative for fever, chills, weight loss, malaise/fatigue and fatigue.  HENT: Negative for congestion, ear discharge, ear pain, rhinorrhea and sore throat.    Eyes: Negative for blurred vision and pain.  Respiratory: Negative for cough, sputum production, shortness of breath and wheezing.   Cardiovascular: Negative for chest pain, palpitations and leg swelling.  Gastrointestinal: Negative for heartburn, nausea, vomiting, abdominal pain, diarrhea, constipation, blood in stool, melena and anorexia.  Genitourinary: Negative for dysuria, urgency, frequency and hematuria.  Musculoskeletal: Negative for myalgias, back pain, joint pain and neck pain.  Skin: Positive for rash. Negative for nail changes.  Neurological: Negative for dizziness, tingling, sensory change, focal weakness and headaches.  Endo/Heme/Allergies: Negative for environmental allergies and polydipsia. Does not bruise/bleed easily.  Psychiatric/Behavioral: Negative for depression and suicidal ideas. The patient is not nervous/anxious and does not have insomnia.      Objective  Filed Vitals:   02/11/16 0900  BP: 120/84  Pulse: 64  Height: 6\' 1"  (1.854 m)  Weight: 235 lb (106.595 kg)    Physical Exam  Constitutional: He is oriented to person, place, and time and well-developed, well-nourished, and in no distress.  HENT:  Head: Normocephalic.  Right Ear: External ear normal.  Left Ear: External ear normal.  Nose: Nose normal.  Mouth/Throat: Oropharynx is clear and moist.  Eyes: Conjunctivae and EOM are normal. Pupils are equal, round, and reactive to light. Right eye exhibits no discharge. Left eye exhibits no  discharge. No scleral icterus.  Neck: Normal range of motion. Neck supple. No JVD present. No tracheal deviation present. No thyromegaly present.  Cardiovascular: Normal rate, regular rhythm, normal heart sounds and intact distal pulses.  Exam reveals no gallop and no friction rub.   No murmur heard. Pulmonary/Chest: Breath sounds normal. No respiratory distress. He has no wheezes. He has no rales.  Abdominal: Soft. Bowel sounds are normal. He exhibits no mass. There is  no hepatosplenomegaly. There is no tenderness. There is no rebound, no guarding and no CVA tenderness.  Musculoskeletal: Normal range of motion. He exhibits no edema or tenderness.  Lymphadenopathy:    He has no cervical adenopathy.  Neurological: He is alert and oriented to person, place, and time. He has normal sensation, normal strength and intact cranial nerves. No cranial nerve deficit.  Skin: Skin is warm. No rash noted.  Psychiatric: Mood and affect normal.  Nursing note and vitals reviewed.     Assessment & Plan  Problem List Items Addressed This Visit    None    Visit Diagnoses    Zoster    -  Primary    Relevant Medications    predniSONE (DELTASONE) 10 MG tablet    valACYclovir (VALTREX) 1000 MG tablet    HYDROcodone-acetaminophen (NORCO/VICODIN) 5-325 MG tablet         Dr. [facility]  02/11/2016

## 2016-03-06 ENCOUNTER — Other Ambulatory Visit: Payer: Self-pay | Admitting: Family Medicine

## 2016-03-07 ENCOUNTER — Other Ambulatory Visit: Payer: Self-pay

## 2016-03-14 ENCOUNTER — Other Ambulatory Visit: Payer: Self-pay | Admitting: Family Medicine

## 2016-03-29 ENCOUNTER — Other Ambulatory Visit: Payer: Self-pay | Admitting: Family Medicine

## 2016-04-04 ENCOUNTER — Other Ambulatory Visit: Payer: Self-pay | Admitting: Family Medicine

## 2016-04-08 ENCOUNTER — Encounter: Payer: Self-pay | Admitting: Family Medicine

## 2016-04-08 ENCOUNTER — Ambulatory Visit (INDEPENDENT_AMBULATORY_CARE_PROVIDER_SITE_OTHER): Payer: BC Managed Care – PPO | Admitting: Family Medicine

## 2016-04-08 VITALS — BP 120/80 | HR 84 | Ht 72.0 in | Wt 238.0 lb

## 2016-04-08 DIAGNOSIS — B0229 Other postherpetic nervous system involvement: Secondary | ICD-10-CM | POA: Diagnosis not present

## 2016-04-08 MED ORDER — GABAPENTIN 100 MG PO CAPS: 100.0000 mg | ORAL_CAPSULE | Freq: Every day | ORAL | Status: DC

## 2016-04-08 MED ORDER — VALACYCLOVIR HCL 500 MG PO TABS: 500.0000 mg | ORAL_TABLET | Freq: Every day | ORAL | Status: DC

## 2016-04-08 NOTE — Patient Instructions (Signed)
Postherpetic Neuralgia   Postherpetic neuralgia (PHN) is nerve pain that occurs after a shingles infection. Shingles is a painful rash that appears on one side of the body, usually on your trunk or face. Shingles is caused by the varicella-zoster virus. This is the same virus that causes chickenpox. In people who have had chickenpox, the virus can resurface years later and cause shingles.   You may have PHN if you continue to have pain for 3 months after your shingles rash has gone away. PHN appears in the same area where you had the shingles rash. For most people, PHN goes away within 1 year.   Getting a vaccination for shingles can prevent PHN. This vaccine is recommended for people older than 50. It may prevent shingles and may also lower your risk of PHN if you do get shingles.   CAUSES   PHN is caused by damage to your nerves from the varicella-zoster virus. This damage makes your nerves overly sensitive.   RISK FACTORS   Aging is the biggest risk factor for developing PHN. Most people who get PHN are older than 60. Other risk factors include:   Having very bad pain before your shingles rash starts.   Having a very bad rash.   Having shingles in the nerve that supplies your face and eye (trigeminal nerve).  SIGNS AND SYMPTOMS   Pain is the main symptom of PHN. The pain is often very bad and may be described as stabbing, burning, or feeling like an electric shock. The pain may come and go or may be there all the time. Pain may be triggered by light touches on the skin or changes in temperature. You may have itching along with the pain.   DIAGNOSIS   Your health care provider may diagnose PHN based on your symptoms and your history of shingles. Lab studies and other diagnostic tests are usually not needed.   TREATMENT   There is no cure for PHN. Treatment for PHN will focus on pain relief. Over-the-counter pain relievers do not usually relieve PHN pain. You may need to work with a pain specialist. Treatment may  include:   Antidepressant medicines to help with pain and improve sleep.   Antiseizure medicines to relieve nerve pain.   Strong pain relievers (opioids).   A numbing patch worn on the skin (lidocaine patch).  HOME CARE INSTRUCTIONS   It may take a long time to recover from PHN. Work closely with your health care provider, and have a good support system at home.   Take all medicines as directed by your health care provider.   Wear loose, comfortable clothing.   Cover sensitive areas with a dressing to reduce friction from clothing rubbing on the area.   If cold does not make your pain worse, try applying a cool compress or cooling gel pack to the area.   Talk to your health care provider if you feel depressed or desperate. Living with long-term pain can be depressing.  SEEK MEDICAL CARE IF:   Your medicine is not helping.   You are struggling to manage your pain at home.  This information is not intended to replace advice given to you by your health care provider. Make sure you discuss any questions you have with your health care provider.   Document Released: 12/31/2002 Document Revised: 10/31/2014 Document Reviewed: 10/01/2013   Elsevier Interactive Patient Education 2016 [employer]

## 2016-04-08 NOTE — Progress Notes (Signed)
Name: [name]   MRN: [id number]    DOB: [birth year]   Date:04/08/2016       Progress Note  Subjective  Chief Complaint  Chief Complaint  Patient presents with  . Rash    L) side "blisters"- side is tender    Rash This is a recurrent problem. The current episode started in the past 7 days. The problem has been waxing and waning since onset. The affected locations include the torso. The rash is characterized by burning and redness. Associated with: previous zoster. Pertinent negatives include no anorexia, congestion, cough, diarrhea, eye pain, facial edema, fatigue, fever, joint pain, nail changes, rhinorrhea, shortness of breath, sore throat or vomiting. Past treatments include analgesics and oral steroids. The treatment provided mild relief.    No problem-specific assessment & plan notes found for this encounter.   Past Medical History  Diagnosis Date  . Hypertension   . Arthritis   . Allergy   . Hyperlipidemia   . Hypokalemia   . Joint pain     Past Surgical History  Procedure Laterality Date  . Joint replacement      Family History  Problem Relation Age of Onset  . Cancer Mother   . Cancer Father   . Cancer Brother   . Hyperlipidemia Brother     Social History   Social History  . Marital Status: Married    Spouse Name: N/A  . Number of Children: N/A  . Years of Education: N/A   Occupational History  . Not on file.   Social History Main Topics  . Smoking status: Never Smoker   . Smokeless tobacco: Never Used  . Alcohol Use: No  . Drug Use: Not on file  . Sexual Activity: Yes   Other Topics Concern  . Not on file   Social History Narrative    Allergies  Allergen Reactions  . Mobic [Meloxicam] Palpitations     Review of Systems  Constitutional: Negative for fever, chills, weight loss, malaise/fatigue and fatigue.  HENT: Negative for congestion, ear discharge, ear pain, rhinorrhea and sore throat.   Eyes: Negative for blurred vision and  pain.  Respiratory: Negative for cough, sputum production, shortness of breath and wheezing.   Cardiovascular: Negative for chest pain, palpitations and leg swelling.  Gastrointestinal: Negative for heartburn, nausea, vomiting, abdominal pain, diarrhea, constipation, blood in stool, melena and anorexia.  Genitourinary: Negative for dysuria, urgency, frequency and hematuria.  Musculoskeletal: Negative for myalgias, back pain, joint pain and neck pain.  Skin: Positive for rash. Negative for nail changes.  Neurological: Negative for dizziness, tingling, sensory change, focal weakness and headaches.  Endo/Heme/Allergies: Negative for environmental allergies and polydipsia. Does not bruise/bleed easily.  Psychiatric/Behavioral: Negative for depression and suicidal ideas. The patient is not nervous/anxious and does not have insomnia.      Objective  Filed Vitals:   04/08/16 0802  BP: 120/80  Pulse: 84  Height: 6' (1.829 m)  Weight: 238 lb (107.956 kg)    Physical Exam  Constitutional: He is oriented to person, place, and time and well-developed, well-nourished, and in no distress.  HENT:  Head: Normocephalic.  Right Ear: External ear normal.  Left Ear: External ear normal.  Nose: Nose normal.  Mouth/Throat: Oropharynx is clear and moist.  Eyes: Conjunctivae and EOM are normal. Pupils are equal, round, and reactive to light. Right eye exhibits no discharge. Left eye exhibits no discharge. No scleral icterus.  Neck: Normal range of motion. Neck supple. No JVD present.  No tracheal deviation present. No thyromegaly present.  Cardiovascular: Normal rate, regular rhythm, normal heart sounds and intact distal pulses.  Exam reveals no gallop and no friction rub.   No murmur heard. Pulmonary/Chest: Breath sounds normal. No respiratory distress. He has no wheezes. He has no rales.  Abdominal: Soft. Bowel sounds are normal. He exhibits no mass. There is no hepatosplenomegaly. There is no  tenderness. There is no rebound, no guarding and no CVA tenderness.  Musculoskeletal: Normal range of motion. He exhibits no edema or tenderness.  Lymphadenopathy:    He has no cervical adenopathy.  Neurological: He is alert and oriented to person, place, and time. He has normal sensation, normal strength and intact cranial nerves. No cranial nerve deficit.  Skin: Skin is warm. Rash noted. Rash is vesicular.  Erythematous base  Psychiatric: Mood and affect normal.  Nursing note and vitals reviewed.     Assessment & Plan  Problem List Items Addressed This Visit    None    Visit Diagnoses    Postherpetic neuralgia    -  Primary    Relevant Medications    valACYclovir (VALTREX) 500 MG tablet    gabapentin (NEURONTIN) 100 MG capsule         Dr. Wess [facility]  04/08/2016

## 2016-04-28 ENCOUNTER — Other Ambulatory Visit: Payer: Self-pay | Admitting: Sports Medicine

## 2016-04-28 DIAGNOSIS — M5416 Radiculopathy, lumbar region: Secondary | ICD-10-CM

## 2016-05-09 ENCOUNTER — Ambulatory Visit
Admission: RE | Admit: 2016-05-09 | Discharge: 2016-05-09 | Disposition: A | Discharge status: Home / Self Care | Payer: BC Managed Care – PPO | Source: Ambulatory Visit | Attending: Sports Medicine | Admitting: Sports Medicine

## 2016-05-09 DIAGNOSIS — M5136 Other intervertebral disc degeneration, lumbar region: Secondary | ICD-10-CM | POA: Diagnosis not present

## 2016-05-09 DIAGNOSIS — M4806 Spinal stenosis, lumbar region: Secondary | ICD-10-CM | POA: Diagnosis not present

## 2016-05-09 DIAGNOSIS — M5416 Radiculopathy, lumbar region: Secondary | ICD-10-CM

## 2016-05-10 ENCOUNTER — Other Ambulatory Visit: Payer: Self-pay | Admitting: Family Medicine

## 2016-05-13 ENCOUNTER — Ambulatory Visit: Payer: BC Managed Care – PPO

## 2016-07-05 ENCOUNTER — Other Ambulatory Visit: Payer: Self-pay | Admitting: Family Medicine

## 2016-07-13 ENCOUNTER — Other Ambulatory Visit: Payer: Self-pay | Admitting: Family Medicine

## 2016-07-15 ENCOUNTER — Other Ambulatory Visit: Payer: Self-pay | Admitting: Family Medicine

## 2016-07-26 ENCOUNTER — Ambulatory Visit (INDEPENDENT_AMBULATORY_CARE_PROVIDER_SITE_OTHER): Payer: BC Managed Care – PPO | Admitting: Family Medicine

## 2016-07-26 ENCOUNTER — Encounter: Payer: Self-pay | Admitting: Family Medicine

## 2016-07-26 VITALS — BP 120/82 | HR 64 | Ht 72.0 in | Wt 242.0 lb

## 2016-07-26 DIAGNOSIS — E876 Hypokalemia: Secondary | ICD-10-CM

## 2016-07-26 DIAGNOSIS — Z23 Encounter for immunization: Secondary | ICD-10-CM | POA: Diagnosis not present

## 2016-07-26 DIAGNOSIS — M1712 Unilateral primary osteoarthritis, left knee: Secondary | ICD-10-CM

## 2016-07-26 DIAGNOSIS — J302 Other seasonal allergic rhinitis: Secondary | ICD-10-CM | POA: Diagnosis not present

## 2016-07-26 DIAGNOSIS — M5136 Other intervertebral disc degeneration, lumbar region: Secondary | ICD-10-CM | POA: Diagnosis not present

## 2016-07-26 DIAGNOSIS — J4520 Mild intermittent asthma, uncomplicated: Secondary | ICD-10-CM | POA: Diagnosis not present

## 2016-07-26 MED ORDER — FEXOFENADINE HCL 180 MG PO TABS: 180.0000 mg | ORAL_TABLET | Freq: Every day | ORAL | 8 refills | Status: DC

## 2016-07-26 MED ORDER — POTASSIUM CHLORIDE CRYS ER 20 MEQ PO TBCR: EXTENDED_RELEASE_TABLET | ORAL | 8 refills | Status: DC

## 2016-07-26 MED ORDER — FLUTICASONE PROPIONATE 50 MCG/ACT NA SUSP: NASAL | 8 refills | Status: DC

## 2016-07-26 MED ORDER — CELECOXIB 200 MG PO CAPS: 200.0000 mg | ORAL_CAPSULE | ORAL | 8 refills | Status: DC

## 2016-07-26 MED ORDER — MONTELUKAST SODIUM 10 MG PO TABS: 10.0000 mg | ORAL_TABLET | Freq: Every day | ORAL | 8 refills | Status: DC

## 2016-07-26 NOTE — Progress Notes (Signed)
Name: [name]   MRN: [id number]    DOB: Dec 23, 1955   Date:07/26/2016       Progress Note  Subjective  Chief Complaint  Chief Complaint  Patient presents with  . Allergic Rhinitis   . Arthritis  . hypokalemia    Arthritis  Presents for follow-up visit. He complains of pain. He reports no stiffness, joint swelling or joint warmth. Affected locations include the right knee (thumbs). Pertinent negatives include no diarrhea, dry eyes, dry mouth, dysuria, fatigue, fever, pain at night, pain while resting, rash, Raynaud's syndrome, uveitis or weight loss. Side effects of treatment include headaches.  Back Pain  This is a chronic problem. The current episode started more than 1 year ago. The problem occurs intermittently. The problem has been waxing and waning since onset. The pain is moderate. The symptoms are aggravated by bending and twisting. Pertinent negatives include no abdominal pain, bladder incontinence, bowel incontinence, chest pain, dysuria, fever, headaches, leg pain, numbness, paresis, paresthesias, pelvic pain, perianal numbness, tingling, weakness or weight loss. He has tried NSAIDs for the symptoms.    No problem-specific Assessment & Plan notes found for this encounter.   Past Medical History:  Diagnosis Date  . Allergy   . Arthritis   . Hyperlipidemia   . Hypertension   . Hypokalemia   . Joint pain     Past Surgical History:  Procedure Laterality Date  . JOINT REPLACEMENT      Family History  Problem Relation Age of Onset  . Cancer Mother   . Cancer Father   . Cancer Brother   . Hyperlipidemia Brother     Social History   Social History  . Marital status: Married    Spouse name: N/A  . Number of children: N/A  . Years of education: N/A   Occupational History  . Not on file.   Social History Main Topics  . Smoking status: Never Smoker  . Smokeless tobacco: Never Used  . Alcohol use No  . Drug use: Unknown  . Sexual activity: Yes    Other Topics Concern  . Not on file   Social History Narrative  . No narrative on file    Allergies  Allergen Reactions  . Mobic [Meloxicam] Palpitations     Review of Systems  Constitutional: Negative for chills, fatigue, fever, malaise/fatigue and weight loss.  HENT: Negative for ear discharge, ear pain and sore throat.   Eyes: Negative for blurred vision.  Respiratory: Negative for cough, sputum production, shortness of breath and wheezing.   Cardiovascular: Negative for chest pain, palpitations and leg swelling.  Gastrointestinal: Negative for abdominal pain, blood in stool, bowel incontinence, constipation, diarrhea, heartburn, melena and nausea.  Genitourinary: Negative for bladder incontinence, dysuria, frequency, hematuria, pelvic pain and urgency.  Musculoskeletal: Positive for arthritis and back pain. Negative for joint pain, joint swelling, myalgias, neck pain and stiffness.  Skin: Negative for rash.  Neurological: Negative for dizziness, tingling, sensory change, focal weakness, weakness, numbness, headaches and paresthesias.  Endo/Heme/Allergies: Negative for environmental allergies and polydipsia. Does not bruise/bleed easily.  Psychiatric/Behavioral: Negative for depression and suicidal ideas. The patient is not nervous/anxious and does not have insomnia.      Objective  Vitals:   07/26/16 1603  BP: 120/82  Pulse: 64  Weight: 242 lb (109.8 kg)  Height: 6' (1.829 m)    Physical Exam  Constitutional: He is oriented to person, place, and time and well-developed, well-nourished, and in no distress.  HENT:  Head:  Normocephalic.  Right Ear: External ear normal.  Left Ear: External ear normal.  Nose: Nose normal.  Mouth/Throat: Oropharynx is clear and moist.  Eyes: Conjunctivae and EOM are normal. Pupils are equal, round, and reactive to light. Right eye exhibits no discharge. Left eye exhibits no discharge. No scleral icterus.  Neck: Normal range of  motion. Neck supple. No JVD present. No tracheal deviation present. No thyromegaly present.  Cardiovascular: Normal rate, regular rhythm, normal heart sounds and intact distal pulses.  Exam reveals no gallop and no friction rub.   No murmur heard. Pulmonary/Chest: Breath sounds normal. No respiratory distress. He has no wheezes. He has no rales.  Abdominal: Soft. Bowel sounds are normal. He exhibits no mass. There is no hepatosplenomegaly. There is no tenderness. There is no rebound, no guarding and no CVA tenderness.  Musculoskeletal: Normal range of motion. He exhibits no edema or tenderness.  Lymphadenopathy:    He has no cervical adenopathy.  Neurological: He is alert and oriented to person, place, and time. He has normal sensation, normal strength, normal reflexes and intact cranial nerves. No cranial nerve deficit.  Skin: Skin is warm. No rash noted.  Psychiatric: Mood and affect normal.      Assessment & Plan  Problem List Items Addressed This Visit      Respiratory   Reactive airway disease   Relevant Medications   montelukast (SINGULAIR) 10 MG tablet    Other Visit Diagnoses    Primary osteoarthritis of left knee    -  Primary   Relevant Medications   celecoxib (CELEBREX) 200 MG capsule   Degenerative disc disease, lumbar       Relevant Medications   celecoxib (CELEBREX) 200 MG capsule   Hypokalemia       Relevant Medications   potassium chloride SA (KLOR-CON M20) 20 MEQ tablet   Other seasonal allergic rhinitis       Relevant Medications   fluticasone (FLONASE) 50 MCG/ACT nasal spray   fexofenadine (ALLEGRA) 180 MG tablet   montelukast (SINGULAIR) 10 MG tablet   Flu vaccine need       Relevant Orders   Flu Vaccine QUAD 36+ mos PF IM (Fluarix & Fluzone Quad PF) (Completed)        Dr. Latecia Miler [facility]  07/26/16

## 2016-10-24 HISTORY — PX: LUMBAR DISC SURGERY: SHX700

## 2016-10-24 HISTORY — PX: REPAIR DURAL / CSF LEAK: SUR1169

## 2016-12-01 ENCOUNTER — Other Ambulatory Visit: Payer: Self-pay

## 2016-12-01 DIAGNOSIS — J302 Other seasonal allergic rhinitis: Secondary | ICD-10-CM

## 2016-12-01 MED ORDER — FLUTICASONE PROPIONATE 50 MCG/ACT NA SUSP: NASAL | 2 refills | Status: DC

## 2016-12-14 ENCOUNTER — Other Ambulatory Visit: Payer: Self-pay | Admitting: Family Medicine

## 2016-12-14 ENCOUNTER — Ambulatory Visit (INDEPENDENT_AMBULATORY_CARE_PROVIDER_SITE_OTHER): Payer: BC Managed Care – PPO | Admitting: Family Medicine

## 2016-12-14 ENCOUNTER — Encounter: Payer: Self-pay | Admitting: Family Medicine

## 2016-12-14 VITALS — BP 120/80 | HR 80 | Ht 72.0 in | Wt 231.0 lb

## 2016-12-14 DIAGNOSIS — J029 Acute pharyngitis, unspecified: Secondary | ICD-10-CM

## 2016-12-14 DIAGNOSIS — E876 Hypokalemia: Secondary | ICD-10-CM

## 2016-12-14 DIAGNOSIS — Z1159 Encounter for screening for other viral diseases: Secondary | ICD-10-CM

## 2016-12-14 MED ORDER — POTASSIUM CHLORIDE CRYS ER 20 MEQ PO TBCR: EXTENDED_RELEASE_TABLET | ORAL | 8 refills | Status: DC

## 2016-12-14 MED ORDER — AMOXICILLIN 500 MG PO CAPS: 500.0000 mg | ORAL_CAPSULE | Freq: Three times a day (TID) | ORAL | 0 refills | Status: DC

## 2016-12-14 NOTE — Progress Notes (Signed)
Name: [name]   MRN: [id number]    DOB: [birth year]   Date:12/14/2016       Progress Note  Subjective  Chief Complaint  Chief Complaint  Patient presents with  . Sore Throat    drainage, "have to clear throat"- using chloraceptic spray- helps some  . hypokalemia    Sore Throat   This is a new problem. The current episode started in the past 7 days. The problem has been waxing and waning. Maximum temperature: low grade. Associated symptoms include congestion, a hoarse voice and trouble swallowing. Pertinent negatives include no abdominal pain, coughing, diarrhea, ear discharge, ear pain, headaches, plugged ear sensation, neck pain, shortness of breath, swollen glands or vomiting. He has had no exposure to strep or mono. Treatments tried: chloroceptic. The treatment provided no relief.    No problem-specific Assessment & Plan notes found for this encounter.   Past Medical History:  Diagnosis Date  . Allergy   . Arthritis   . Hyperlipidemia   . Hypertension   . Hypokalemia   . Joint pain     Past Surgical History:  Procedure Laterality Date  . JOINT REPLACEMENT      Family History  Problem Relation Age of Onset  . Cancer Mother   . Cancer Father   . Cancer Brother   . Hyperlipidemia Brother     Social History   Social History  . Marital status: Married    Spouse name: N/A  . Number of children: N/A  . Years of education: N/A   Occupational History  . Not on file.   Social History Main Topics  . Smoking status: Never Smoker  . Smokeless tobacco: Never Used  . Alcohol use No  . Drug use: Unknown  . Sexual activity: Yes   Other Topics Concern  . Not on file   Social History Narrative  . No narrative on file    Allergies  Allergen Reactions  . Mobic [Meloxicam] Palpitations    Outpatient Medications Prior to Visit  Medication Sig Dispense Refill  . aspirin 325 MG tablet Take 325 mg by mouth daily.    . celecoxib (CELEBREX) 200 MG capsule  Take 1 capsule (200 mg total) by mouth 1 day or 1 dose. 30 capsule 8  . fexofenadine (ALLEGRA) 180 MG tablet Take 1 tablet (180 mg total) by mouth daily. 30 tablet 8  . fluticasone (FLONASE) 50 MCG/ACT nasal spray ONE SPRAY IN NOSTRIL ONCE DAILY AS DIRECTED BY PHYSICIAN 16 g 2  . labetalol (NORMODYNE) 200 MG tablet Take 200 mg by mouth 2 (two) times daily. Dr Nehemiah Massed    . lisinopril (PRINIVIL,ZESTRIL) 20 MG tablet Take 20 mg by mouth 2 (two) times daily. Dr Nehemiah Massed    . montelukast (SINGULAIR) 10 MG tablet Take 1 tablet (10 mg total) by mouth daily. 30 tablet 8  . Multiple Vitamins-Minerals (MULTIVITAMIN WITH MINERALS) tablet Take 1 tablet by mouth daily.    . Omega 3 1000 MG CAPS Take 1 capsule by mouth daily at 6 (six) AM.    . spironolactone (ALDACTONE) 25 MG tablet Take 25 mg by mouth daily.    . sucralfate (CARAFATE) 1 G tablet Take 1 tablet (1 g total) by mouth 4 (four) times daily. 60 tablet 2  . potassium chloride SA (KLOR-CON M20) 20 MEQ tablet TAKE THREE TABLETS BY MOUTH ONCE DAILY 30 tablet 8  . valACYclovir (VALTREX) 500 MG tablet Take 1 tablet (500 mg total) by mouth daily. (Patient not  taking: Reported on 07/26/2016) 14 tablet 0   No facility-administered medications prior to visit.     Review of Systems  Constitutional: Positive for malaise/fatigue. Negative for chills, fever and weight loss.  HENT: Positive for congestion, hoarse voice and trouble swallowing. Negative for ear discharge, ear pain, nosebleeds and sore throat.   Eyes: Negative for blurred vision.  Respiratory: Negative for cough, sputum production, shortness of breath and wheezing.   Cardiovascular: Negative for chest pain, palpitations and leg swelling.  Gastrointestinal: Negative for abdominal pain, blood in stool, constipation, diarrhea, heartburn, melena, nausea and vomiting.  Genitourinary: Negative for dysuria, frequency, hematuria and urgency.  Musculoskeletal: Negative for back pain, joint pain, myalgias  and neck pain.  Skin: Negative for itching and rash.  Neurological: Negative for dizziness, tingling, sensory change, focal weakness and headaches.  Endo/Heme/Allergies: Negative for environmental allergies and polydipsia. Does not bruise/bleed easily.  Psychiatric/Behavioral: Negative for depression and suicidal ideas. The patient is not nervous/anxious and does not have insomnia.      Objective  Vitals:   12/14/16 1007  BP: 120/80  Pulse: 80  Weight: 231 lb (104.8 kg)  Height: 6' (1.829 m)    Physical Exam  Constitutional: He is oriented to person, place, and time and well-developed, well-nourished, and in no distress.  HENT:  Head: Normocephalic.  Right Ear: External ear normal.  Left Ear: External ear normal.  Nose: Nose normal.  Mouth/Throat: Oropharynx is clear and moist. No oropharyngeal exudate, posterior oropharyngeal edema, posterior oropharyngeal erythema or tonsillar abscesses.  Eyes: Conjunctivae and EOM are normal. Pupils are equal, round, and reactive to light. Right eye exhibits no discharge. Left eye exhibits no discharge. No scleral icterus.  Neck: Normal range of motion. Neck supple. No JVD present. No tracheal deviation present. No thyromegaly present.  Cardiovascular: Normal rate, regular rhythm, normal heart sounds and intact distal pulses.  Exam reveals no gallop and no friction rub.   No murmur heard. Pulmonary/Chest: Breath sounds normal. No respiratory distress. He has no wheezes. He has no rales.  Abdominal: Soft. Bowel sounds are normal. He exhibits no mass. There is no hepatosplenomegaly. There is no tenderness. There is no rebound, no guarding and no CVA tenderness.  Musculoskeletal: Normal range of motion. He exhibits no edema or tenderness.  Lymphadenopathy:       Head (right side): Submandibular adenopathy present.       Head (left side): Submandibular adenopathy present.    He has no cervical adenopathy.  Neurological: He is alert and oriented to  person, place, and time. He has normal sensation, normal strength, normal reflexes and intact cranial nerves. No cranial nerve deficit.  Skin: Skin is warm. No rash noted.  Psychiatric: Mood and affect normal.  Nursing note and vitals reviewed.     Assessment & Plan  Problem List Items Addressed This Visit    None    Visit Diagnoses    Pharyngitis, unspecified etiology    -  Primary   Relevant Medications   amoxicillin (AMOXIL) 500 MG capsule   Hypokalemia       Relevant Medications   potassium chloride SA (KLOR-CON M20) 20 MEQ tablet   Need for hepatitis C screening test       Relevant Orders   Hepatitis C antibody      Meds ordered this encounter  Medications  . DISCONTD: potassium chloride SA (KLOR-CON M20) 20 MEQ tablet    Sig: TAKE THREE TABLETS BY MOUTH ONCE DAILY    Dispense:  30 tablet    Refill:  8    sched appt for "med refill"  . amoxicillin (AMOXIL) 500 MG capsule    Sig: Take 1 capsule (500 mg total) by mouth 3 (three) times daily.    Dispense:  30 capsule    Refill:  0  . potassium chloride SA (KLOR-CON M20) 20 MEQ tablet    Sig: TAKE THREE TABLETS BY MOUTH ONCE DAILY    Dispense:  90 tablet    Refill:  8      Dr. Babara [facility]  12/14/16

## 2016-12-15 LAB — HEPATITIS C ANTIBODY: Hep C Virus Ab: <0.1 s/co ratio (ref 0.0–0.9)

## 2016-12-19 LAB — HEPATITIS C ANTIBODY

## 2016-12-23 ENCOUNTER — Other Ambulatory Visit: Payer: Self-pay

## 2017-01-13 ENCOUNTER — Encounter: Payer: Self-pay | Admitting: Family Medicine

## 2017-01-13 ENCOUNTER — Ambulatory Visit (INDEPENDENT_AMBULATORY_CARE_PROVIDER_SITE_OTHER): Payer: BC Managed Care – PPO | Admitting: Family Medicine

## 2017-01-13 VITALS — BP 120/80 | HR 68 | Temp 98.4°F | Ht 72.0 in | Wt 239.0 lb

## 2017-01-13 DIAGNOSIS — J0100 Acute maxillary sinusitis, unspecified: Secondary | ICD-10-CM | POA: Diagnosis not present

## 2017-01-13 DIAGNOSIS — J40 Bronchitis, not specified as acute or chronic: Secondary | ICD-10-CM

## 2017-01-13 DIAGNOSIS — K219 Gastro-esophageal reflux disease without esophagitis: Secondary | ICD-10-CM | POA: Diagnosis not present

## 2017-01-13 MED ORDER — GUAIFENESIN-CODEINE 100-10 MG/5ML PO SYRP: 5.0000 mL | ORAL_SOLUTION | Freq: Three times a day (TID) | ORAL | 0 refills | Status: DC | PRN

## 2017-01-13 MED ORDER — RANITIDINE HCL 150 MG PO CAPS: 150.0000 mg | ORAL_CAPSULE | Freq: Every evening | ORAL | 11 refills | Status: DC

## 2017-01-13 MED ORDER — AMOXICILLIN 500 MG PO CAPS: 500.0000 mg | ORAL_CAPSULE | Freq: Three times a day (TID) | ORAL | 1 refills | Status: DC

## 2017-01-13 NOTE — Progress Notes (Signed)
Name: [name]   MRN: [id number]    DOB: 1956/10/10   Date:01/13/2017       Progress Note  Subjective  Chief Complaint  Chief Complaint  Patient presents with  . Sinusitis    taking Sinutab and mucinex D for 5 days- helped with cough during the day but gets worse at night. Cough and cong- green/ yellow production    Sinusitis  This is a recurrent problem. The current episode started in the past 7 days. The problem has been gradually worsening since onset. The pain is mild. Associated symptoms include congestion, coughing and sinus pressure. Pertinent negatives include no chills, diaphoresis, ear pain, headaches, hoarse voice, neck pain, shortness of breath, sneezing, sore throat or swollen glands. Past treatments include nothing. The treatment provided mild relief.  Gastroesophageal Reflux  He complains of coughing and heartburn. He reports no abdominal pain, no chest pain, no dysphagia, no hoarse voice, no nausea, no sore throat or no wheezing. This is a recurrent problem. The problem has been waxing and waning. The symptoms are aggravated by certain foods (cough). Pertinent negatives include no melena or weight loss. He has tried nothing for the symptoms.    No problem-specific Assessment & Plan notes found for this encounter.   Past Medical History:  Diagnosis Date  . Allergy   . Arthritis   . Hyperlipidemia   . Hypertension   . Hypokalemia   . Joint pain     Past Surgical History:  Procedure Laterality Date  . JOINT REPLACEMENT      Family History  Problem Relation Age of Onset  . Cancer Mother   . Cancer Father   . Cancer Brother   . Hyperlipidemia Brother     Social History   Social History  . Marital status: Married    Spouse name: N/A  . Number of children: N/A  . Years of education: N/A   Occupational History  . Not on file.   Social History Main Topics  . Smoking status: Never Smoker  . Smokeless tobacco: Never Used  . Alcohol use No  .  Drug use: Unknown  . Sexual activity: Yes   Other Topics Concern  . Not on file   Social History Narrative  . No narrative on file    Allergies  Allergen Reactions  . Mobic [Meloxicam] Palpitations    Outpatient Medications Prior to Visit  Medication Sig Dispense Refill  . aspirin 325 MG tablet Take 325 mg by mouth daily.    . celecoxib (CELEBREX) 200 MG capsule Take 1 capsule (200 mg total) by mouth 1 day or 1 dose. 30 capsule 8  . fexofenadine (ALLEGRA) 180 MG tablet Take 1 tablet (180 mg total) by mouth daily. 30 tablet 8  . fluticasone (FLONASE) 50 MCG/ACT nasal spray ONE SPRAY IN NOSTRIL ONCE DAILY AS DIRECTED BY PHYSICIAN 16 g 2  . labetalol (NORMODYNE) 200 MG tablet Take 200 mg by mouth 2 (two) times daily. Dr Nehemiah Massed    . lisinopril (PRINIVIL,ZESTRIL) 20 MG tablet Take 20 mg by mouth 2 (two) times daily. Dr Nehemiah Massed    . montelukast (SINGULAIR) 10 MG tablet Take 1 tablet (10 mg total) by mouth daily. 30 tablet 8  . Multiple Vitamins-Minerals (MULTIVITAMIN WITH MINERALS) tablet Take 1 tablet by mouth daily.    . Omega 3 1000 MG CAPS Take 1 capsule by mouth daily at 6 (six) AM.    . potassium chloride SA (KLOR-CON M20) 20 MEQ tablet TAKE THREE  TABLETS BY MOUTH ONCE DAILY 90 tablet 8  . spironolactone (ALDACTONE) 25 MG tablet Take 25 mg by mouth daily.    . sucralfate (CARAFATE) 1 G tablet Take 1 tablet (1 g total) by mouth 4 (four) times daily. 60 tablet 2  . amoxicillin (AMOXIL) 500 MG capsule Take 1 capsule (500 mg total) by mouth 3 (three) times daily. 30 capsule 0   No facility-administered medications prior to visit.     Review of Systems  Constitutional: Negative for chills, diaphoresis, fever, malaise/fatigue and weight loss.  HENT: Positive for congestion and sinus pressure. Negative for ear discharge, ear pain, hoarse voice, sneezing and sore throat.   Eyes: Negative for blurred vision.  Respiratory: Positive for cough. Negative for sputum production, shortness  of breath and wheezing.   Cardiovascular: Negative for chest pain, palpitations and leg swelling.  Gastrointestinal: Positive for heartburn. Negative for abdominal pain, blood in stool, constipation, diarrhea, dysphagia, melena and nausea.  Genitourinary: Negative for dysuria, frequency, hematuria and urgency.  Musculoskeletal: Negative for back pain, joint pain, myalgias and neck pain.  Skin: Negative for rash.  Neurological: Negative for dizziness, tingling, sensory change, focal weakness and headaches.  Endo/Heme/Allergies: Negative for environmental allergies and polydipsia. Does not bruise/bleed easily.  Psychiatric/Behavioral: Negative for depression and suicidal ideas. The patient is not nervous/anxious and does not have insomnia.      Objective  Vitals:   01/13/17 1601  BP: 120/80  Pulse: 68  Temp: 98.4 F (36.9 C)  TempSrc: Oral  Weight: 239 lb (108.4 kg)  Height: 6' (1.829 m)    Physical Exam  Constitutional: He is oriented to person, place, and time and well-developed, well-nourished, and in no distress.  HENT:  Head: Normocephalic.  Right Ear: External ear normal.  Left Ear: External ear normal.  Nose: Nose normal.  Mouth/Throat: Oropharynx is clear and moist.  Eyes: Conjunctivae and EOM are normal. Pupils are equal, round, and reactive to light. Right eye exhibits no discharge. Left eye exhibits no discharge. No scleral icterus.  Neck: Normal range of motion. Neck supple. No JVD present. No tracheal deviation present. No thyromegaly present.  Cardiovascular: Normal rate, regular rhythm, normal heart sounds and intact distal pulses.  Exam reveals no gallop and no friction rub.   No murmur heard. Pulmonary/Chest: Breath sounds normal. No respiratory distress. He has no wheezes. He has no rales.  Abdominal: Soft. Bowel sounds are normal. He exhibits no mass. There is no hepatosplenomegaly. There is no tenderness. There is no rebound, no guarding and no CVA tenderness.   Musculoskeletal: Normal range of motion. He exhibits no edema or tenderness.  Lymphadenopathy:    He has no cervical adenopathy.  Neurological: He is alert and oriented to person, place, and time. He has normal sensation, normal strength, normal reflexes and intact cranial nerves. No cranial nerve deficit.  Skin: Skin is warm. No rash noted.  Psychiatric: Mood and affect normal.  Nursing note and vitals reviewed.     Assessment & Plan  Problem List Items Addressed This Visit      Respiratory   Bronchitis   Relevant Medications   amoxicillin (AMOXIL) 500 MG capsule   guaiFENesin-codeine (ROBITUSSIN AC) 100-10 MG/5ML syrup    Other Visit Diagnoses    Acute maxillary sinusitis, recurrence not specified    -  Primary   Relevant Medications   amoxicillin (AMOXIL) 500 MG capsule   guaiFENesin-codeine (ROBITUSSIN AC) 100-10 MG/5ML syrup   Gastroesophageal reflux disease, esophagitis presence not specified  Relevant Medications   ranitidine (ZANTAC) 150 MG capsule      Meds ordered this encounter  Medications  . amoxicillin (AMOXIL) 500 MG capsule    Sig: Take 1 capsule (500 mg total) by mouth 3 (three) times daily.    Dispense:  30 capsule    Refill:  1  . ranitidine (ZANTAC) 150 MG capsule    Sig: Take 1 capsule (150 mg total) by mouth every evening.    Dispense:  30 capsule    Refill:  11  . guaiFENesin-codeine (ROBITUSSIN AC) 100-10 MG/5ML syrup    Sig: Take 5 mLs by mouth 3 (three) times daily as needed for cough.    Dispense:  150 mL    Refill:  0      Dr. Otilio Miu [facility]  01/13/17

## 2017-01-25 ENCOUNTER — Other Ambulatory Visit: Payer: Self-pay

## 2017-01-25 ENCOUNTER — Telehealth: Payer: Self-pay

## 2017-01-25 DIAGNOSIS — R066 Hiccough: Secondary | ICD-10-CM

## 2017-01-25 MED ORDER — SUCRALFATE 1 G PO TABS: 1.0000 g | ORAL_TABLET | Freq: Four times a day (QID) | ORAL | 2 refills | Status: DC

## 2017-01-25 NOTE — Telephone Encounter (Signed)
E, Vickie called, wanted refill on sucralfate sent to [facility] in [place]- done

## 2017-01-28 ENCOUNTER — Encounter: Payer: Self-pay | Admitting: Emergency Medicine

## 2017-01-28 ENCOUNTER — Emergency Department: Payer: BC Managed Care – PPO

## 2017-01-28 ENCOUNTER — Emergency Department
Admission: EM | Admit: 2017-01-28 | Discharge: 2017-01-29 | Disposition: A | Discharge status: Home / Self Care | Payer: BC Managed Care – PPO | Attending: Emergency Medicine | Admitting: Emergency Medicine

## 2017-01-28 DIAGNOSIS — Z7982 Long term (current) use of aspirin: Secondary | ICD-10-CM | POA: Diagnosis not present

## 2017-01-28 DIAGNOSIS — I10 Essential (primary) hypertension: Secondary | ICD-10-CM | POA: Insufficient documentation

## 2017-01-28 DIAGNOSIS — F17220 Nicotine dependence, chewing tobacco, uncomplicated: Secondary | ICD-10-CM | POA: Insufficient documentation

## 2017-01-28 DIAGNOSIS — I480 Paroxysmal atrial fibrillation: Secondary | ICD-10-CM

## 2017-01-28 DIAGNOSIS — Z79899 Other long term (current) drug therapy: Secondary | ICD-10-CM | POA: Insufficient documentation

## 2017-01-28 DIAGNOSIS — I4891 Unspecified atrial fibrillation: Secondary | ICD-10-CM | POA: Diagnosis not present

## 2017-01-28 DIAGNOSIS — R0789 Other chest pain: Secondary | ICD-10-CM | POA: Diagnosis present

## 2017-01-28 DIAGNOSIS — R079 Chest pain, unspecified: Secondary | ICD-10-CM

## 2017-01-28 HISTORY — DX: Paroxysmal atrial fibrillation: I48.0

## 2017-01-28 LAB — BASIC METABOLIC PANEL
ANION GAP: 7 (ref 5–15)
BUN: 22 mg/dL — ABNORMAL HIGH (ref 6–20)
CO2: 24 mmol/L (ref 22–32)
Calcium: 8.6 mg/dL — ABNORMAL LOW (ref 8.9–10.3)
Chloride: 110 mmol/L (ref 101–111)
Creatinine, Ser: 0.75 mg/dL (ref 0.61–1.24)
GFR calc Af Amer: >60 mL/min (ref >60)
GLUCOSE: 113 mg/dL — AB (ref 65–99)
POTASSIUM: 3.3 mmol/L — AB (ref 3.5–5.1)
Sodium: 141 mmol/L (ref 135–145)

## 2017-01-28 LAB — HEPATIC FUNCTION PANEL
ALBUMIN: 3.7 g/dL (ref 3.5–5.0)
ALK PHOS: 39 U/L (ref 38–126)
ALT: 29 U/L (ref 17–63)
AST: 27 U/L (ref 15–41)
Bilirubin, Direct: <0.1 mg/dL — ABNORMAL LOW (ref 0.1–0.5)
TOTAL PROTEIN: 6.4 g/dL — AB (ref 6.5–8.1)
Total Bilirubin: 0.5 mg/dL (ref 0.3–1.2)

## 2017-01-28 LAB — CBC
HEMATOCRIT: 39.1 % — AB (ref 40.0–52.0)
HEMOGLOBIN: 13.3 g/dL (ref 13.0–18.0)
MCH: 29.7 pg (ref 26.0–34.0)
MCHC: 34.1 g/dL (ref 32.0–36.0)
MCV: 87.3 fL (ref 80.0–100.0)
Platelets: 162 10*3/uL (ref 150–440)
RBC: 4.48 MIL/uL (ref 4.40–5.90)
RDW: 15.2 % — ABNORMAL HIGH (ref 11.5–14.5)
WBC: 6.4 10*3/uL (ref 3.8–10.6)

## 2017-01-28 LAB — TROPONIN I
Troponin I: <0.03 ng/mL (ref <0.03)
Troponin I: <0.03 ng/mL (ref <0.03)

## 2017-01-28 MED ORDER — ASPIRIN 81 MG PO CHEW
162.0000 mg | CHEWABLE_TABLET | Freq: Once | ORAL | Status: AC
  Administered 2017-01-28: 162 mg via ORAL
  Filled 2017-01-28: qty 2

## 2017-01-28 MED ORDER — DILTIAZEM HCL ER COATED BEADS 120 MG PO CP24: 120.0000 mg | ORAL_CAPSULE | Freq: Every day | ORAL | 0 refills | Status: DC

## 2017-01-28 MED ORDER — APIXABAN 5 MG PO TABS
5.0000 mg | ORAL_TABLET | Freq: Once | ORAL | Status: AC
  Administered 2017-01-28: 5 mg via ORAL
  Filled 2017-01-28: qty 1

## 2017-01-28 MED ORDER — DILTIAZEM HCL ER COATED BEADS 120 MG PO CP24
120.0000 mg | ORAL_CAPSULE | Freq: Once | ORAL | Status: AC
  Administered 2017-01-28: 120 mg via ORAL
  Filled 2017-01-28: qty 1

## 2017-01-28 MED ORDER — APIXABAN 5 MG PO TABS: 5.0000 mg | ORAL_TABLET | Freq: Two times a day (BID) | ORAL | 0 refills | Status: DC

## 2017-01-28 NOTE — ED Notes (Signed)
Per EDP ok for patient to ambulate to restroom.

## 2017-01-28 NOTE — ED Triage Notes (Signed)
Pt presents to ED via [facility] from [place] c/o afib. EMS reports pt initial HE 140-180. Given 20mg  cardizem IV with HR reduced to 80-100. Pt states the last time this happened his potassium level was 1.5. States he takes 23mEq potassium every day. Denies chest pain, SOB, lightheadedness, or dizziness.

## 2017-01-28 NOTE — ED Provider Notes (Signed)
[facility] Emergency Department Provider Note  ____________________________________________   First MD Initiated Contact with Patient 01/28/17 1840     (approximate)  I have reviewed the triage vital signs and the nursing notes.   HISTORY  Chief Complaint Irregular Heart Beat    HPI [name] is a 61 y.o. male who comes to the emergency department via EMS for irregular heartbeat and chest pain. He said he was at home watching the Masters when he began to feel his heart flutter and he checked his pulse and noted it was in the 140s. He also began to feel left-sided chest tightness that he initially thought was indigestion but it quickly radiated to his left jaw and became pressure and then drove to the local fire department where they ran an EKG and noted he was in atrial fibrillation in the 140s and so he was given 20 mg of intravenous diltiazem and he was escorted to the emergency department.   Past Medical History:  Diagnosis Date  . Allergy   . Arthritis   . Hyperlipidemia   . Hypertension   . Hypokalemia   . Joint pain     Patient Active Problem List   Diagnosis Date Noted  . Taking multiple medications for chronic disease 07/30/2015  . Bronchitis 07/01/2015  . Reactive airway disease 07/01/2015  . Allergic sinusitis 07/01/2015  . Essential hypertension 07/01/2015  . Valvular heart disease 07/01/2015    Past Surgical History:  Procedure Laterality Date  . JOINT REPLACEMENT      Prior to Admission medications   Medication Sig Start Date End Date Taking? Authorizing Provider  aspirin 325 MG tablet Take 325 mg by mouth at bedtime.    Yes Historical Provider, MD  celecoxib (CELEBREX) 200 MG capsule Take 1 capsule (200 mg total) by mouth 1 day or 1 dose. 07/26/16  Yes Juline Patch, MD  fexofenadine (ALLEGRA) 180 MG tablet Take 1 tablet (180 mg total) by mouth daily. 07/26/16  Yes Juline Patch, MD  fluticasone (FLONASE) 50 MCG/ACT  nasal spray ONE SPRAY IN NOSTRIL ONCE DAILY AS DIRECTED BY PHYSICIAN 12/01/16  Yes Juline Patch, MD  labetalol (NORMODYNE) 200 MG tablet Take 200 mg by mouth 2 (two) times daily. Dr Nehemiah Massed   Yes Historical Provider, MD  lisinopril (PRINIVIL,ZESTRIL) 20 MG tablet Take 20 mg by mouth 2 (two) times daily. Dr Nehemiah Massed   Yes Historical Provider, MD  montelukast (SINGULAIR) 10 MG tablet Take 1 tablet (10 mg total) by mouth daily. 07/26/16  Yes Juline Patch, MD  Multiple Vitamins-Minerals (MULTIVITAMIN WITH MINERALS) tablet Take 1 tablet by mouth daily.   Yes Historical Provider, MD  Omega 3 1000 MG CAPS Take 1 capsule by mouth every evening.    Yes Historical Provider, MD  potassium chloride SA (KLOR-CON M20) 20 MEQ tablet TAKE THREE TABLETS BY MOUTH ONCE DAILY 12/14/16  Yes Juline Patch, MD  predniSONE (STERAPRED UNI-PAK 21 TAB) 10 MG (21) TBPK tablet Take 10 mg by mouth daily. 6, 5, 4, 3,  2, 1   Yes Historical Provider, MD  spironolactone (ALDACTONE) 25 MG tablet Take 25 mg by mouth daily.   Yes Historical Provider, MD  sucralfate (CARAFATE) 1 g tablet Take 1 tablet (1 g total) by mouth 4 (four) times daily. 01/25/17  Yes Juline Patch, MD  apixaban (ELIQUIS) 5 MG TABS tablet Take 1 tablet (5 mg total) by mouth 2 (two) times daily. 01/28/17   Milta Deiters  Karis Rilling, MD  diltiazem (CARDIZEM CD) 120 MG 24 hr capsule Take 1 capsule (120 mg total) by mouth daily. 01/28/17   Darel Hong, MD  ranitidine (ZANTAC) 150 MG capsule Take 1 capsule (150 mg total) by mouth every evening. Patient not taking: Reported on 01/28/2017 01/13/17   Juline Patch, MD    Allergies Mobic [meloxicam]  Family History  Problem Relation Age of Onset  . Cancer Mother   . Cancer Father   . Cancer Brother   . Hyperlipidemia Brother     Social History Social History  Substance Use Topics  . Smoking status: Never Smoker  . Smokeless tobacco: Current User    Types: Chew  . Alcohol use No    Review of Systems Constitutional:  No fever/chills Eyes: No visual changes. ENT: No sore throat. Cardiovascular: Positive chest pain. Respiratory: Denies shortness of breath. Gastrointestinal: No abdominal pain.  No nausea, no vomiting.  No diarrhea.  No constipation. Genitourinary: Negative for dysuria. Musculoskeletal: Negative for back pain. Skin: Negative for rash. Neurological: Negative for headaches, focal weakness or numbness.  10-point ROS otherwise negative.  ____________________________________________   PHYSICAL EXAM:  VITAL SIGNS: ED Triage Vitals [01/28/17 1820]  Enc Vitals Group     BP (!) 128/96     Pulse Rate 93     Resp 20     Temp 98 F (36.7 C)     Temp Source Oral     SpO2 97 %     Weight      Height      Head Circumference      Peak Flow      Pain Score      Pain Loc      Pain Edu?      Excl. in [place]?     Constitutional: Alert and oriented x 4 well appearing nontoxic no diaphoresis speaks in full, clear sentences Eyes: PERRL EOMI. Head: Atraumatic. Nose: No congestion/rhinnorhea. Mouth/Throat: No trismus Neck: No stridor.   Cardiovascular: Normal rate and irregularly irregular rhythm. Grossly normal heart sounds.  Good peripheral circulation. Respiratory: Normal respiratory effort.  No retractions. Lungs CTAB and moving good air Gastrointestinal: Soft nondistended nontender no rebound no guarding no peritonitis no McBurney's tenderness negative Rovsing's no costovertebral tenderness negative Murphy's Musculoskeletal: No lower extremity edema   Neurologic:  Normal speech and language. No gross focal neurologic deficits are appreciated. Skin:  Skin is warm, dry and intact. No rash noted. Psychiatric: Mood and affect are normal. Speech and behavior are normal.    ____________________________________________   DIFFERENTIAL  Atrial fibrillation, acute coronary syndrome, musculoskeletal chest pain ____________________________________________   LABS (all labs ordered are  listed, but only abnormal results are displayed)  Labs Reviewed  BASIC METABOLIC PANEL - Abnormal; Notable for the following:       Result Value   Potassium 3.3 (*)    Glucose, Bld 113 (*)    BUN 22 (*)    Calcium 8.6 (*)    All other components within normal limits  CBC - Abnormal; Notable for the following:    HCT 39.1 (*)    RDW 15.2 (*)    All other components within normal limits  HEPATIC FUNCTION PANEL - Abnormal; Notable for the following:    Total Protein 6.4 (*)    Bilirubin, Direct <0.1 (*)    All other components within normal limits  TROPONIN I  TROPONIN I    Troponin negative 2 __________________________________________  EKG  ED ECG REPORT I,  Darel Hong, the attending physician, personally viewed and interpreted this ECG.  Date: 01/29/2017 Rate: 83 Rhythm: Atrial fibrillation rate controlled QRS Axis: normal Intervals: normal ST/T Wave abnormalities: normal Conduction Disturbances: none Narrative Interpretation: Abnormal  ____________________________________________  RADIOLOGY  Chest x-ray with no acute disease ____________________________________________   PROCEDURES  Procedure(s) performed: no  Procedures  Critical Care performed: no  ____________________________________________   INITIAL IMPRESSION / ASSESSMENT AND PLAN / ED COURSE  Pertinent labs & imaging results that were available during my care of the patient were reviewed by me and considered in my medical decision making (see chart for details).  On arrival the patient is rate controlled after receiving 20 mg of diltiazem the field. I will touch base with his cardiologist Dr. Nehemiah Massed once his first set of labs is back regarding possible anticoagulation and oral nodal blockers.Marland Kitchen     ----------------------------------------- 7:37 PM on 01/28/2017 -----------------------------------------  Fortunately the patient's first troponin is negative. He is still in atrial  fibrillation although rate controlled. I will touch base with Dr. Nehemiah Massed regarding medication selection. ____________________________________________   ----------------------------------------- 7:45 PM on 01/28/2017 -----------------------------------------  Spoke with Dr. Nehemiah Massed who recommends 120 mg of long-acting diltiazem daily, 5 mg of Eliquis twice a day, and if the second troponin is negative he can follow up at 3:30 PM on Monday, April 9.  The patient's second troponin is negative and she has already received his oral dose of diltiazem and Eliquis. At this point he is medically stable for outpatient management understands and agrees with the plan.  FINAL CLINICAL IMPRESSION(S) / ED DIAGNOSES  Final diagnoses:  Atrial fibrillation, unspecified type ([place])  Chest pain, unspecified type      NEW MEDICATIONS STARTED DURING THIS VISIT:  Discharge Medication List as of 01/28/2017 11:45 PM    START taking these medications   Details  apixaban (ELIQUIS) 5 MG TABS tablet Take 1 tablet (5 mg total) by mouth 2 (two) times daily., Starting Sat 01/28/2017, Print    diltiazem (CARDIZEM CD) 120 MG 24 hr capsule Take 1 capsule (120 mg total) by mouth daily., Starting Sat 01/28/2017, Print         Note:  This document was prepared using Dragon voice recognition software and may include unintentional dictation errors.     Darel Hong, MD 01/29/17 1438

## 2017-01-28 NOTE — Discharge Instructions (Signed)
Please keep your appointment on Monday at 3:30 PM with Dr. Nehemiah Massed.  Return to the emergency department sooner for any new or worsening symptoms such as chest pain, shortness of breath, or for any other concerns.  It was a pleasure to take care of you today, and thank you for coming to our emergency department.  If you have any questions or concerns before leaving please ask the nurse to grab me and I'm more than happy to go through your aftercare instructions again.  If you were prescribed any opioid pain medication today such as Norco, Vicodin, Percocet, morphine, hydrocodone, or oxycodone please make sure you do not drive when you are taking this medication as it can alter your ability to drive safely.  If you have any concerns once you are home that you are not improving or are in fact getting worse before you can make it to your follow-up appointment, please do not hesitate to call 911 and come back for further evaluation.  Darel Hong MD  Results for orders placed or performed during the hospital encounter of 96/78/93  Basic metabolic panel  Result Value Ref Range   Sodium 141 135 - 145 mmol/L   Potassium 3.3 (L) 3.5 - 5.1 mmol/L   Chloride 110 101 - 111 mmol/L   CO2 24 22 - 32 mmol/L   Glucose, Bld 113 (H) 65 - 99 mg/dL   BUN 22 (H) 6 - 20 mg/dL   Creatinine, Ser 0.75 0.61 - 1.24 mg/dL   Calcium 8.6 (L) 8.9 - 10.3 mg/dL   GFR calc non Af Amer >60 >60 mL/min   GFR calc Af Amer >60 >60 mL/min   Anion gap 7 5 - 15  CBC  Result Value Ref Range   WBC 6.4 3.8 - 10.6 K/uL   RBC 4.48 4.40 - 5.90 MIL/uL   Hemoglobin 13.3 13.0 - 18.0 g/dL   HCT 39.1 (L) 40.0 - 52.0 %   MCV 87.3 80.0 - 100.0 fL   MCH 29.7 26.0 - 34.0 pg   MCHC 34.1 32.0 - 36.0 g/dL   RDW 15.2 (H) 11.5 - 14.5 %   Platelets 162 150 - 440 K/uL  Troponin I  Result Value Ref Range   Troponin I <0.03 <0.03 ng/mL  Hepatic function panel  Result Value Ref Range   Total Protein 6.4 (L) 6.5 - 8.1 g/dL   Albumin 3.7 3.5  - 5.0 g/dL   AST 27 15 - 41 U/L   ALT 29 17 - 63 U/L   Alkaline Phosphatase 39 38 - 126 U/L   Total Bilirubin 0.5 0.3 - 1.2 mg/dL   Bilirubin, Direct <0.1 (L) 0.1 - 0.5 mg/dL   Indirect Bilirubin NOT CALCULATED 0.3 - 0.9 mg/dL  Troponin I  Result Value Ref Range   Troponin I <0.03 <0.03 ng/mL   Dg Chest Port 1 View  Result Date: 01/28/2017 CLINICAL DATA:  Chest pain, atrial fibrillation EXAM: PORTABLE CHEST 1 VIEW COMPARISON:  06/19/2015 FINDINGS: Lungs are clear.  No pleural effusion or pneumothorax. The heart is normal in size. IMPRESSION: No evidence of acute cardiopulmonary disease. Electronically Signed   By: Julian Hy M.D.   On: 01/28/2017 19:02

## 2017-02-08 ENCOUNTER — Ambulatory Visit
Admission: RE | Admit: 2017-02-08 | Payer: BC Managed Care – PPO | Source: Ambulatory Visit | Admitting: Internal Medicine

## 2017-02-08 ENCOUNTER — Encounter: Admission: RE | Payer: Self-pay | Source: Ambulatory Visit

## 2017-02-08 SURGERY — CARDIOVERSION (CATH LAB)
Anesthesia: General

## 2017-04-04 ENCOUNTER — Ambulatory Visit
Admission: EM | Admit: 2017-04-04 | Discharge: 2017-04-04 | Discharge status: Absent without leave | Payer: BC Managed Care – PPO

## 2017-06-15 ENCOUNTER — Other Ambulatory Visit: Payer: Self-pay | Admitting: Sports Medicine

## 2017-06-15 DIAGNOSIS — M5416 Radiculopathy, lumbar region: Secondary | ICD-10-CM

## 2017-06-20 ENCOUNTER — Ambulatory Visit
Admission: RE | Admit: 2017-06-20 | Discharge: 2017-06-20 | Disposition: A | Discharge status: Home / Self Care | Payer: BC Managed Care – PPO | Source: Ambulatory Visit | Attending: Sports Medicine | Admitting: Sports Medicine

## 2017-06-20 DIAGNOSIS — M48061 Spinal stenosis, lumbar region without neurogenic claudication: Secondary | ICD-10-CM | POA: Insufficient documentation

## 2017-06-20 DIAGNOSIS — M5116 Intervertebral disc disorders with radiculopathy, lumbar region: Secondary | ICD-10-CM | POA: Diagnosis not present

## 2017-06-20 DIAGNOSIS — M5416 Radiculopathy, lumbar region: Secondary | ICD-10-CM

## 2017-06-27 ENCOUNTER — Ambulatory Visit: Payer: BC Managed Care – PPO

## 2017-07-03 ENCOUNTER — Other Ambulatory Visit: Payer: Self-pay | Admitting: Family Medicine

## 2017-07-18 ENCOUNTER — Other Ambulatory Visit: Payer: Self-pay | Admitting: Family Medicine

## 2017-07-18 DIAGNOSIS — J302 Other seasonal allergic rhinitis: Secondary | ICD-10-CM

## 2017-07-31 ENCOUNTER — Other Ambulatory Visit: Payer: Self-pay | Admitting: Family Medicine

## 2017-07-31 DIAGNOSIS — M1712 Unilateral primary osteoarthritis, left knee: Secondary | ICD-10-CM

## 2017-07-31 DIAGNOSIS — M5136 Other intervertebral disc degeneration, lumbar region: Secondary | ICD-10-CM

## 2017-09-01 ENCOUNTER — Other Ambulatory Visit: Payer: Self-pay | Admitting: Family Medicine

## 2017-09-01 DIAGNOSIS — M1712 Unilateral primary osteoarthritis, left knee: Secondary | ICD-10-CM

## 2017-09-01 DIAGNOSIS — M5136 Other intervertebral disc degeneration, lumbar region: Secondary | ICD-10-CM

## 2017-09-05 ENCOUNTER — Other Ambulatory Visit: Payer: Self-pay

## 2017-09-09 ENCOUNTER — Other Ambulatory Visit: Payer: Self-pay | Admitting: Family Medicine

## 2017-09-25 ENCOUNTER — Other Ambulatory Visit (HOSPITAL_COMMUNITY): Payer: Self-pay | Admitting: Neurosurgery

## 2017-09-25 ENCOUNTER — Other Ambulatory Visit: Payer: Self-pay | Admitting: Neurosurgery

## 2017-09-25 DIAGNOSIS — M5126 Other intervertebral disc displacement, lumbar region: Secondary | ICD-10-CM

## 2017-10-04 ENCOUNTER — Other Ambulatory Visit: Payer: Self-pay | Admitting: Family Medicine

## 2017-10-04 ENCOUNTER — Ambulatory Visit
Admission: RE | Admit: 2017-10-04 | Discharge: 2017-10-04 | Disposition: A | Discharge status: Home / Self Care | Payer: BC Managed Care – PPO | Source: Ambulatory Visit | Attending: Neurosurgery | Admitting: Neurosurgery

## 2017-10-04 ENCOUNTER — Other Ambulatory Visit
Admission: RE | Admit: 2017-10-04 | Discharge: 2017-10-04 | Disposition: A | Discharge status: Home / Self Care | Payer: BC Managed Care – PPO | Source: Ambulatory Visit | Attending: Neurosurgery | Admitting: Neurosurgery

## 2017-10-04 DIAGNOSIS — M5127 Other intervertebral disc displacement, lumbosacral region: Secondary | ICD-10-CM | POA: Diagnosis not present

## 2017-10-04 DIAGNOSIS — M48061 Spinal stenosis, lumbar region without neurogenic claudication: Secondary | ICD-10-CM | POA: Diagnosis not present

## 2017-10-04 DIAGNOSIS — M5126 Other intervertebral disc displacement, lumbar region: Secondary | ICD-10-CM | POA: Insufficient documentation

## 2017-10-04 DIAGNOSIS — J302 Other seasonal allergic rhinitis: Secondary | ICD-10-CM

## 2017-10-04 LAB — CREATININE, SERUM
Creatinine, Ser: 1.1 mg/dL (ref 0.61–1.24)
GFR calc Af Amer: >60 mL/min (ref >60)
GFR calc non Af Amer: >60 mL/min (ref >60)

## 2017-10-04 LAB — BUN: BUN: 15 mg/dL (ref 6–20)

## 2017-10-04 MED ORDER — GADOBENATE DIMEGLUMINE 529 MG/ML IV SOLN
20.0000 mL | Freq: Once | INTRAVENOUS | Status: AC | PRN
  Administered 2017-10-04: 20 mL via INTRAVENOUS

## 2017-10-08 ENCOUNTER — Other Ambulatory Visit: Payer: Self-pay | Admitting: Family Medicine

## 2017-10-08 DIAGNOSIS — M1712 Unilateral primary osteoarthritis, left knee: Secondary | ICD-10-CM

## 2017-10-08 DIAGNOSIS — M5136 Other intervertebral disc degeneration, lumbar region: Secondary | ICD-10-CM

## 2017-10-12 DIAGNOSIS — M1711 Unilateral primary osteoarthritis, right knee: Secondary | ICD-10-CM | POA: Insufficient documentation

## 2017-11-07 ENCOUNTER — Other Ambulatory Visit: Payer: Self-pay | Admitting: Family Medicine

## 2017-11-07 DIAGNOSIS — M1712 Unilateral primary osteoarthritis, left knee: Secondary | ICD-10-CM

## 2017-11-07 DIAGNOSIS — M5136 Other intervertebral disc degeneration, lumbar region: Secondary | ICD-10-CM

## 2017-11-14 DIAGNOSIS — M5440 Lumbago with sciatica, unspecified side: Secondary | ICD-10-CM | POA: Insufficient documentation

## 2017-12-02 ENCOUNTER — Other Ambulatory Visit: Payer: Self-pay | Admitting: Family Medicine

## 2017-12-02 DIAGNOSIS — J302 Other seasonal allergic rhinitis: Secondary | ICD-10-CM

## 2017-12-05 ENCOUNTER — Encounter: Payer: BC Managed Care – PPO | Admitting: Family Medicine

## 2017-12-14 ENCOUNTER — Encounter: Payer: Self-pay | Admitting: Family Medicine

## 2017-12-14 ENCOUNTER — Ambulatory Visit (INDEPENDENT_AMBULATORY_CARE_PROVIDER_SITE_OTHER): Payer: BC Managed Care – PPO | Admitting: Family Medicine

## 2017-12-14 VITALS — BP 140/100 | HR 76 | Ht 72.0 in | Wt 236.0 lb

## 2017-12-14 DIAGNOSIS — R351 Nocturia: Secondary | ICD-10-CM

## 2017-12-14 DIAGNOSIS — J302 Other seasonal allergic rhinitis: Secondary | ICD-10-CM | POA: Diagnosis not present

## 2017-12-14 DIAGNOSIS — M51369 Other intervertebral disc degeneration, lumbar region without mention of lumbar back pain or lower extremity pain: Secondary | ICD-10-CM

## 2017-12-14 DIAGNOSIS — E876 Hypokalemia: Secondary | ICD-10-CM

## 2017-12-14 DIAGNOSIS — Z0000 Encounter for general adult medical examination without abnormal findings: Secondary | ICD-10-CM | POA: Diagnosis not present

## 2017-12-14 DIAGNOSIS — M1712 Unilateral primary osteoarthritis, left knee: Secondary | ICD-10-CM | POA: Diagnosis not present

## 2017-12-14 DIAGNOSIS — N401 Enlarged prostate with lower urinary tract symptoms: Secondary | ICD-10-CM

## 2017-12-14 DIAGNOSIS — K219 Gastro-esophageal reflux disease without esophagitis: Secondary | ICD-10-CM

## 2017-12-14 DIAGNOSIS — J069 Acute upper respiratory infection, unspecified: Secondary | ICD-10-CM

## 2017-12-14 DIAGNOSIS — M48062 Spinal stenosis, lumbar region with neurogenic claudication: Secondary | ICD-10-CM | POA: Diagnosis present

## 2017-12-14 DIAGNOSIS — M5136 Other intervertebral disc degeneration, lumbar region: Secondary | ICD-10-CM

## 2017-12-14 MED ORDER — MONTELUKAST SODIUM 10 MG PO TABS: 10.0000 mg | ORAL_TABLET | Freq: Every day | ORAL | 1 refills | Status: DC

## 2017-12-14 MED ORDER — RANITIDINE HCL 150 MG PO CAPS: 150.0000 mg | ORAL_CAPSULE | Freq: Every evening | ORAL | 1 refills | Status: DC

## 2017-12-14 MED ORDER — POTASSIUM CHLORIDE CRYS ER 20 MEQ PO TBCR: EXTENDED_RELEASE_TABLET | ORAL | 1 refills | Status: DC

## 2017-12-14 MED ORDER — CELECOXIB 200 MG PO CAPS: 200.0000 mg | ORAL_CAPSULE | Freq: Every day | ORAL | 1 refills | Status: DC

## 2017-12-14 NOTE — Progress Notes (Signed)
Name: [name]   MRN: [id number]    DOB: [birth year]   Date:12/14/2017       Progress Note  Subjective  Chief Complaint  Chief Complaint  Patient presents with  . Gastroesophageal Reflux  . Allergic Rhinitis   . Arthritis  . hypokalemia  . Annual Exam    Patient presents for annual physical exam.   Gastroesophageal Reflux  He reports no abdominal pain, no belching, no chest pain, no choking, no coughing, no early satiety, no heartburn, no hoarse voice, no nausea, no sore throat, no water brash or no wheezing. This is a recurrent problem. The current episode started more than 1 year ago. The problem occurs occasionally. The problem has been gradually worsening. The symptoms are aggravated by certain foods. Pertinent negatives include no anemia, fatigue, melena, muscle weakness, orthopnea or weight loss. There are no known risk factors. He has tried a histamine-2 antagonist for the symptoms. The treatment provided moderate relief.  Arthritis  Presents for follow-up visit. He complains of pain and stiffness. The symptoms have been stable. Affected locations include the neck (back). Pertinent negatives include no diarrhea, dysuria, fatigue, fever, rash or weight loss.  Hyperlipidemia  This is a chronic problem. The problem is uncontrolled. Recent lipid tests were reviewed and are low. Pertinent negatives include no chest pain, focal weakness, myalgias or shortness of breath. The current treatment provides mild improvement of lipids.  Benign Prostatic Hypertrophy  This is a recurrent problem. The problem has been waxing and waning since onset. Irritative symptoms include frequency and nocturia. Irritative symptoms do not include urgency. occupation expsure/ prostatitis . Obstructive symptoms include dribbling, a slower stream and a weak stream. Obstructive symptoms do not include incomplete emptying, an intermittent stream or straining. Pertinent negatives include no chills, dysuria,  genital pain, hematuria, hesitancy or nausea.    No problem-specific Assessment & Plan notes found for this encounter.   Past Medical History:  Diagnosis Date  . Allergy   . Arthritis   . Hyperlipidemia   . Hypertension   . Hypokalemia   . Joint pain     Past Surgical History:  Procedure Laterality Date  . JOINT REPLACEMENT      Family History  Problem Relation Age of Onset  . Cancer Mother   . Cancer Father   . Cancer Brother   . Hyperlipidemia Brother     Social History   Socioeconomic History  . Marital status: Married    Spouse name: Not on file  . Number of children: Not on file  . Years of education: Not on file  . Highest education level: Not on file  Social Needs  . Financial resource strain: Not on file  . Food insecurity - worry: Not on file  . Food insecurity - inability: Not on file  . Transportation needs - medical: Not on file  . Transportation needs - non-medical: Not on file  Occupational History  . Not on file  Tobacco Use  . Smoking status: Never Smoker  . Smokeless tobacco: Current User    Types: Chew  Substance and Sexual Activity  . Alcohol use: No  . Drug use: Not on file  . Sexual activity: Yes  Other Topics Concern  . Not on file  Social History Narrative  . Not on file    Allergies  Allergen Reactions  . Mobic [Meloxicam] Palpitations    Outpatient Medications Prior to Visit  Medication Sig Dispense Refill  . aspirin 325 MG tablet Take  325 mg by mouth at bedtime.     Marland Kitchen diltiazem (CARDIZEM CD) 120 MG 24 hr capsule Take 1 capsule (120 mg total) by mouth daily. 30 capsule 0  . fexofenadine (ALLEGRA) 180 MG tablet Take 1 tablet (180 mg total) by mouth daily. 30 tablet 8  . fluticasone (FLONASE) 50 MCG/ACT nasal spray 1 SPRAY IN EACH NOSTRIL ONCE DAILY 16 g 0  . lisinopril (PRINIVIL,ZESTRIL) 20 MG tablet Take 20 mg by mouth 2 (two) times daily. Dr Nehemiah Massed    . Multiple Vitamins-Minerals (MULTIVITAMIN WITH MINERALS) tablet  Take 1 tablet by mouth daily.    Marland Kitchen spironolactone (ALDACTONE) 25 MG tablet Take 25 mg by mouth daily.    . sucralfate (CARAFATE) 1 g tablet Take 1 tablet (1 g total) by mouth 4 (four) times daily. 60 tablet 2  . vitamin C (ASCORBIC ACID) 500 MG tablet Take 500 mg by mouth daily.    . celecoxib (CELEBREX) 200 MG capsule TAKE 1 CAPSULE BY MOUTH ONCE DAILY 15 capsule 0  . montelukast (SINGULAIR) 10 MG tablet TAKE 1 TABLET BY MOUTH ONCE DAILY 30 tablet 0  . potassium chloride SA (KLOR-CON M20) 20 MEQ tablet TAKE THREE TABLETS BY MOUTH ONCE DAILY 90 tablet 8  . ranitidine (ZANTAC) 150 MG capsule Take 1 capsule (150 mg total) by mouth every evening. 30 capsule 11  . Omega 3 1000 MG CAPS Take 1 capsule by mouth every evening.     Marland Kitchen apixaban (ELIQUIS) 5 MG TABS tablet Take 1 tablet (5 mg total) by mouth 2 (two) times daily. 60 tablet 0  . labetalol (NORMODYNE) 200 MG tablet Take 200 mg by mouth 2 (two) times daily. Dr Nehemiah Massed    . montelukast (SINGULAIR) 10 MG tablet Take 1 tablet (10 mg total) by mouth daily. 30 tablet 8  . predniSONE (STERAPRED UNI-PAK 21 TAB) 10 MG (21) TBPK tablet Take 10 mg by mouth daily. 6, 5, 4, 3,  2, 1     No facility-administered medications prior to visit.     Review of Systems  Constitutional: Negative for chills, fatigue, fever, malaise/fatigue and weight loss.  HENT: Negative for ear discharge, ear pain, hoarse voice and sore throat.   Eyes: Negative for blurred vision.  Respiratory: Negative for cough, sputum production, choking, shortness of breath and wheezing.   Cardiovascular: Negative for chest pain, palpitations and leg swelling.  Gastrointestinal: Negative for abdominal pain, blood in stool, constipation, diarrhea, heartburn, melena and nausea.  Genitourinary: Positive for frequency and nocturia. Negative for dysuria, hematuria, hesitancy, incomplete emptying and urgency.  Musculoskeletal: Positive for arthritis and stiffness. Negative for back pain, joint  pain, myalgias, muscle weakness and neck pain.  Skin: Negative for rash.  Neurological: Negative for dizziness, tingling, sensory change, focal weakness and headaches.  Endo/Heme/Allergies: Negative for environmental allergies and polydipsia. Does not bruise/bleed easily.  Psychiatric/Behavioral: Negative for depression and suicidal ideas. The patient is not nervous/anxious and does not have insomnia.      Objective  Vitals:   12/14/17 0954  BP: (!) 140/100  Pulse: 76  Weight: 236 lb (107 kg)  Height: 6' (1.829 m)    Physical Exam  Constitutional: He is oriented to person, place, and time and well-developed, well-nourished, and in no distress. Vital signs are normal.  HENT:  Head: Normocephalic and atraumatic.  Right Ear: Tympanic membrane, external ear and ear canal normal.  Left Ear: Tympanic membrane, external ear and ear canal normal.  Nose: Nose normal.  Mouth/Throat: Uvula is  midline, oropharynx is clear and moist and mucous membranes are normal. Abnormal dentition. No oropharyngeal exudate, posterior oropharyngeal edema or posterior oropharyngeal erythema.  Eyes: Conjunctivae, EOM and lids are normal. Pupils are equal, round, and reactive to light. Lids are everted and swept, no foreign bodies found. Right eye exhibits no discharge. Left eye exhibits no discharge. No scleral icterus.  Fundoscopic exam:      The right eye shows no arteriolar narrowing and no AV nicking.       The left eye shows no arteriolar narrowing and no AV nicking.  Neck: Trachea normal, normal range of motion and full passive range of motion without pain. Neck supple. Normal carotid pulses, no hepatojugular reflux and no JVD present. Carotid bruit is not present. No tracheal deviation present. No thyromegaly present.  Cardiovascular: Normal rate, regular rhythm, S1 normal, S2 normal, normal heart sounds and intact distal pulses. PMI is not displaced. Exam reveals no gallop, no S3, no S4, no friction rub and  no decreased pulses.  No murmur heard. Pulmonary/Chest: Breath sounds normal. No respiratory distress. He has no wheezes. He has no rales. Right breast exhibits no mass. Left breast exhibits no mass.  Abdominal: Soft. Normal aorta and bowel sounds are normal. He exhibits no mass. There is no hepatosplenomegaly. There is no tenderness. There is no rigidity, no rebound, no guarding and no CVA tenderness.  Genitourinary: Prostate normal, testes/scrotum normal and penis normal. Rectal exam shows no tenderness and guaiac negative stool. No discharge found.  Musculoskeletal: Normal range of motion. He exhibits no edema or tenderness.  Lymphadenopathy:       Head (right side): No submental and no submandibular adenopathy present.       Head (left side): No submental and no submandibular adenopathy present.    He has no cervical adenopathy.  Neurological: He is alert and oriented to person, place, and time. He has normal sensation, normal strength, normal reflexes and intact cranial nerves. No cranial nerve deficit.  Skin: Skin is warm. No rash noted.  Psychiatric: Mood and affect normal.  Nursing note and vitals reviewed.     Assessment & Plan  Problem List Items Addressed This Visit    None    Visit Diagnoses    Annual physical exam    -  Primary   Relevant Orders   Lipid Panel With LDL/HDL Ratio   Primary osteoarthritis of left knee       Relevant Medications   celecoxib (CELEBREX) 200 MG capsule   Degenerative disc disease, lumbar       Relevant Medications   celecoxib (CELEBREX) 200 MG capsule   Gastroesophageal reflux disease, esophagitis presence not specified       Relevant Medications   ranitidine (ZANTAC) 150 MG capsule   Hypokalemia       Relevant Medications   potassium chloride SA (KLOR-CON M20) 20 MEQ tablet   Other Relevant Orders   Renal Function Panel   Seasonal allergic rhinitis, unspecified trigger       Relevant Medications   montelukast (SINGULAIR) 10 MG tablet    Viral upper respiratory tract infection       otc suggested   Benign prostatic hyperplasia with nocturia       Relevant Orders   PSA      Meds ordered this encounter  Medications  . celecoxib (CELEBREX) 200 MG capsule    Sig: Take 1 capsule (200 mg total) by mouth daily.    Dispense:  90 capsule  Refill:  1  . montelukast (SINGULAIR) 10 MG tablet    Sig: Take 1 tablet (10 mg total) by mouth daily.    Dispense:  90 tablet    Refill:  1  . ranitidine (ZANTAC) 150 MG capsule    Sig: Take 1 capsule (150 mg total) by mouth every evening.    Dispense:  90 capsule    Refill:  1  . potassium chloride SA (KLOR-CON M20) 20 MEQ tablet    Sig: TAKE THREE TABLETS BY MOUTH ONCE DAILY    Dispense:  270 tablet    Refill:  1      Dr. Joc[facility]  12/14/17

## 2017-12-15 LAB — LIPID PANEL WITH LDL/HDL RATIO
CHOLESTEROL TOTAL: 218 mg/dL — AB (ref 100–199)
HDL: 50 mg/dL (ref >39)
LDL Calculated: 143 mg/dL — ABNORMAL HIGH (ref 0–99)
LDL/HDL RATIO: 2.9 ratio (ref 0.0–3.6)
Triglycerides: 123 mg/dL (ref 0–149)
VLDL Cholesterol Cal: 25 mg/dL (ref 5–40)

## 2017-12-15 LAB — RENAL FUNCTION PANEL
ALBUMIN: 4.4 g/dL (ref 3.6–4.8)
BUN / CREAT RATIO: 12 (ref 10–24)
BUN: 13 mg/dL (ref 8–27)
CHLORIDE: 103 mmol/L (ref 96–106)
CO2: 24 mmol/L (ref 20–29)
CREATININE: 1.06 mg/dL (ref 0.76–1.27)
Calcium: 9.4 mg/dL (ref 8.6–10.2)
GFR calc Af Amer: 86 mL/min/{1.73_m2} (ref >59)
GFR calc non Af Amer: 74 mL/min/{1.73_m2} (ref >59)
Glucose: 77 mg/dL (ref 65–99)
PHOSPHORUS: 3.5 mg/dL (ref 2.5–4.5)
Potassium: 4.8 mmol/L (ref 3.5–5.2)
Sodium: 142 mmol/L (ref 134–144)

## 2017-12-15 LAB — PSA: Prostate Specific Ag, Serum: 1.5 ng/mL (ref 0.0–4.0)

## 2017-12-26 ENCOUNTER — Telehealth: Payer: Self-pay

## 2017-12-26 ENCOUNTER — Other Ambulatory Visit: Payer: Self-pay

## 2017-12-26 NOTE — Telephone Encounter (Signed)
Wife called requesting something "for nerves to help him fly"- called in Alprazolam 0.25mg  take 1 qday PRN anxiety # 6 with no refills to [name]

## 2018-01-26 ENCOUNTER — Ambulatory Visit: Payer: BC Managed Care – PPO | Admitting: Family Medicine

## 2018-01-26 ENCOUNTER — Encounter: Payer: Self-pay | Admitting: Family Medicine

## 2018-01-26 VITALS — BP 136/88 | HR 100 | Temp 98.6°F | Ht 72.0 in | Wt 239.0 lb

## 2018-01-26 DIAGNOSIS — J40 Bronchitis, not specified as acute or chronic: Secondary | ICD-10-CM

## 2018-01-26 DIAGNOSIS — J0100 Acute maxillary sinusitis, unspecified: Secondary | ICD-10-CM | POA: Diagnosis not present

## 2018-01-26 MED ORDER — AMOXICILLIN-POT CLAVULANATE 875-125 MG PO TABS: 1.0000 | ORAL_TABLET | Freq: Two times a day (BID) | ORAL | 0 refills | Status: DC

## 2018-01-26 MED ORDER — GUAIFENESIN-CODEINE 100-10 MG/5ML PO SYRP: 5.0000 mL | ORAL_SOLUTION | Freq: Three times a day (TID) | ORAL | 0 refills | Status: DC | PRN

## 2018-01-26 NOTE — Progress Notes (Signed)
Name: [name]   MRN: [id number]    DOB: [birth year]   Date:01/26/2018       Progress Note  Subjective  Chief Complaint  Chief Complaint  Patient presents with  . Sinusitis    started with a tickle in the throat, felt feverish and has productive cough    Sinusitis  This is a new problem. The current episode started in the past 7 days (sinus discharge/yellow/green). The problem has been gradually worsening since onset. There has been no fever. The fever has been present for 1 to 2 days. Associated symptoms include congestion, coughing, ear pain, headaches, a hoarse voice, sinus pressure, sneezing and a sore throat. Pertinent negatives include no chills, diaphoresis, neck pain, shortness of breath or swollen glands. Past treatments include oral decongestants (allegra). The treatment provided moderate relief.    No problem-specific Assessment & Plan notes found for this encounter.   Past Medical History:  Diagnosis Date  . Allergy   . Arthritis   . Hyperlipidemia   . Hypertension   . Hypokalemia   . Joint pain     Past Surgical History:  Procedure Laterality Date  . JOINT REPLACEMENT      Family History  Problem Relation Age of Onset  . Cancer Mother   . Cancer Father   . Cancer Brother   . Hyperlipidemia Brother     Social History   Socioeconomic History  . Marital status: Married    Spouse name: Not on file  . Number of children: Not on file  . Years of education: Not on file  . Highest education level: Not on file  Occupational History  . Not on file  Social Needs  . Financial resource strain: Not on file  . Food insecurity:    Worry: Not on file    Inability: Not on file  . Transportation needs:    Medical: Not on file    Non-medical: Not on file  Tobacco Use  . Smoking status: Never Smoker  . Smokeless tobacco: Current User    Types: Chew  Substance and Sexual Activity  . Alcohol use: No  . Drug use: Not on file  . Sexual activity: Yes   Lifestyle  . Physical activity:    Days per week: Not on file    Minutes per session: Not on file  . Stress: Not on file  Relationships  . Social connections:    Talks on phone: Not on file    Gets together: Not on file    Attends religious service: Not on file    Active member of club or organization: Not on file    Attends meetings of clubs or organizations: Not on file    Relationship status: Not on file  . Intimate partner violence:    Fear of current or ex partner: Not on file    Emotionally abused: Not on file    Physically abused: Not on file    Forced sexual activity: Not on file  Other Topics Concern  . Not on file  Social History Narrative  . Not on file    Allergies  Allergen Reactions  . Mobic [Meloxicam] Palpitations    Outpatient Medications Prior to Visit  Medication Sig Dispense Refill  . aspirin 325 MG tablet Take 325 mg by mouth at bedtime.     . celecoxib (CELEBREX) 200 MG capsule Take 1 capsule (200 mg total) by mouth daily. 90 capsule 1  . diltiazem (CARDIZEM CD) 120 MG  24 hr capsule Take 1 capsule (120 mg total) by mouth daily. 30 capsule 0  . fexofenadine (ALLEGRA) 180 MG tablet Take 1 tablet (180 mg total) by mouth daily. 30 tablet 8  . fluticasone (FLONASE) 50 MCG/ACT nasal spray 1 SPRAY IN EACH NOSTRIL ONCE DAILY 16 g 0  . lisinopril (PRINIVIL,ZESTRIL) 20 MG tablet Take 20 mg by mouth 2 (two) times daily. Dr Nehemiah Massed    . montelukast (SINGULAIR) 10 MG tablet Take 1 tablet (10 mg total) by mouth daily. 90 tablet 1  . Multiple Vitamins-Minerals (MULTIVITAMIN WITH MINERALS) tablet Take 1 tablet by mouth daily.    . potassium chloride SA (KLOR-CON M20) 20 MEQ tablet TAKE THREE TABLETS BY MOUTH ONCE DAILY 270 tablet 1  . ranitidine (ZANTAC) 150 MG capsule Take 1 capsule (150 mg total) by mouth every evening. 90 capsule 1  . spironolactone (ALDACTONE) 25 MG tablet Take 25 mg by mouth daily.    . sucralfate (CARAFATE) 1 g tablet Take 1 tablet (1 g total)  by mouth 4 (four) times daily. 60 tablet 2  . vitamin C (ASCORBIC ACID) 500 MG tablet Take 500 mg by mouth daily.    . Omega 3 1000 MG CAPS Take 1 capsule by mouth every evening.      No facility-administered medications prior to visit.     Review of Systems  Constitutional: Negative for chills, diaphoresis, fever, malaise/fatigue and weight loss.  HENT: Positive for congestion, ear pain, hoarse voice, sinus pressure, sneezing and sore throat. Negative for ear discharge.   Eyes: Negative for blurred vision.  Respiratory: Positive for cough. Negative for sputum production, shortness of breath and wheezing.   Cardiovascular: Negative for chest pain, palpitations and leg swelling.  Gastrointestinal: Negative for abdominal pain, blood in stool, constipation, diarrhea, heartburn, melena and nausea.  Genitourinary: Negative for dysuria, frequency, hematuria and urgency.  Musculoskeletal: Negative for back pain, joint pain, myalgias and neck pain.  Skin: Negative for rash.  Neurological: Positive for headaches. Negative for dizziness, tingling, sensory change and focal weakness.  Endo/Heme/Allergies: Negative for environmental allergies and polydipsia. Does not bruise/bleed easily.  Psychiatric/Behavioral: Negative for depression and suicidal ideas. The patient is not nervous/anxious and does not have insomnia.      Objective  Vitals:   01/26/18 1437  BP: 136/88  Pulse: 100  Temp: 98.6 F (37 C)  TempSrc: Oral  Weight: 239 lb (108.4 kg)  Height: 6' (1.829 m)    Physical Exam  Constitutional: He is oriented to person, place, and time and well-developed, well-nourished, and in no distress.  HENT:  Head: Normocephalic.  Right Ear: External ear normal.  Left Ear: External ear normal.  Nose: Nose normal.  Mouth/Throat: Oropharynx is clear and moist.  Eyes: Pupils are equal, round, and reactive to light. Conjunctivae and EOM are normal. Right eye exhibits no discharge. Left eye  exhibits no discharge. No scleral icterus.  Neck: Normal range of motion. Neck supple. No JVD present. No tracheal deviation present. No thyromegaly present.  Cardiovascular: Normal rate, regular rhythm, normal heart sounds and intact distal pulses. Exam reveals no gallop and no friction rub.  No murmur heard. Pulmonary/Chest: Breath sounds normal. No respiratory distress. He has no wheezes. He has no rales.  Abdominal: Soft. Bowel sounds are normal. He exhibits no mass. There is no hepatosplenomegaly. There is no tenderness. There is no rebound, no guarding and no CVA tenderness.  Musculoskeletal: Normal range of motion. He exhibits no edema or tenderness.  Lymphadenopathy:  He has no cervical adenopathy.  Neurological: He is alert and oriented to person, place, and time. He has normal sensation, normal strength, normal reflexes and intact cranial nerves. No cranial nerve deficit.  Skin: Skin is warm. No rash noted.  Psychiatric: Mood and affect normal.  Nursing note and vitals reviewed.     Assessment & Plan  Problem List Items Addressed This Visit      Respiratory   Bronchitis    Other Visit Diagnoses    Acute maxillary sinusitis, recurrence not specified    -  Primary   Relevant Medications   amoxicillin-clavulanate (AUGMENTIN) 875-125 MG tablet   guaiFENesin-codeine (ROBITUSSIN AC) 100-10 MG/5ML syrup      Meds ordered this encounter  Medications  . amoxicillin-clavulanate (AUGMENTIN) 875-125 MG tablet    Sig: Take 1 tablet by mouth 2 (two) times daily.    Dispense:  20 tablet    Refill:  0  . guaiFENesin-codeine (ROBITUSSIN AC) 100-10 MG/5ML syrup    Sig: Take 5 mLs by mouth 3 (three) times daily as needed for cough.    Dispense:  150 mL    Refill:  0      Dr. [facility]  01/26/18

## 2018-02-19 ENCOUNTER — Other Ambulatory Visit: Payer: Self-pay | Admitting: Family Medicine

## 2018-02-19 DIAGNOSIS — J302 Other seasonal allergic rhinitis: Secondary | ICD-10-CM

## 2018-06-03 ENCOUNTER — Other Ambulatory Visit: Payer: Self-pay | Admitting: Family Medicine

## 2018-06-03 DIAGNOSIS — M5136 Other intervertebral disc degeneration, lumbar region: Secondary | ICD-10-CM

## 2018-06-03 DIAGNOSIS — M1712 Unilateral primary osteoarthritis, left knee: Secondary | ICD-10-CM

## 2018-06-10 ENCOUNTER — Other Ambulatory Visit: Payer: Self-pay | Admitting: Family Medicine

## 2018-06-10 DIAGNOSIS — J302 Other seasonal allergic rhinitis: Secondary | ICD-10-CM

## 2018-06-10 DIAGNOSIS — K219 Gastro-esophageal reflux disease without esophagitis: Secondary | ICD-10-CM

## 2018-06-27 ENCOUNTER — Other Ambulatory Visit: Payer: Self-pay | Admitting: Family Medicine

## 2018-06-27 DIAGNOSIS — E876 Hypokalemia: Secondary | ICD-10-CM

## 2018-07-27 DIAGNOSIS — M7521 Bicipital tendinitis, right shoulder: Secondary | ICD-10-CM | POA: Insufficient documentation

## 2018-07-28 ENCOUNTER — Ambulatory Visit
Admission: EM | Admit: 2018-07-28 | Discharge: 2018-07-28 | Disposition: A | Discharge status: Home / Self Care | Payer: BC Managed Care – PPO | Attending: Family Medicine | Admitting: Family Medicine

## 2018-07-28 ENCOUNTER — Other Ambulatory Visit: Payer: Self-pay

## 2018-07-28 DIAGNOSIS — R350 Frequency of micturition: Secondary | ICD-10-CM | POA: Diagnosis not present

## 2018-07-28 DIAGNOSIS — R300 Dysuria: Secondary | ICD-10-CM | POA: Diagnosis not present

## 2018-07-28 DIAGNOSIS — N410 Acute prostatitis: Secondary | ICD-10-CM

## 2018-07-28 LAB — URINALYSIS, COMPLETE (UACMP) WITH MICROSCOPIC
BACTERIA UA: NONE SEEN
BILIRUBIN URINE: NEGATIVE
Glucose, UA: NEGATIVE mg/dL
Ketones, ur: NEGATIVE mg/dL
LEUKOCYTES UA: NEGATIVE
Nitrite: NEGATIVE
Protein, ur: NEGATIVE mg/dL
SPECIFIC GRAVITY, URINE: 1.015 (ref 1.005–1.030)
Squamous Epithelial / LPF: NONE SEEN (ref 0–5)
pH: 6 (ref 5.0–8.0)

## 2018-07-28 MED ORDER — SULFAMETHOXAZOLE-TRIMETHOPRIM 800-160 MG PO TABS: 1.0000 | ORAL_TABLET | Freq: Two times a day (BID) | ORAL | 0 refills | Status: DC

## 2018-07-28 MED ORDER — ONDANSETRON 8 MG PO TBDP: 8.0000 mg | ORAL_TABLET | Freq: Three times a day (TID) | ORAL | 0 refills | Status: DC | PRN

## 2018-07-28 NOTE — ED Provider Notes (Signed)
[facility]    CSN: [id number] Arrival date & time: 07/28/18  1258     History   Chief Complaint Chief Complaint  Patient presents with  . Urinary Frequency    HPI [name] is a 62 y.o. male.   62 yo male with a c/o urinary frequency, weak stream,mild discomfort when urinating and intermittent right flank pain for the past 2-3 days. States he felt feverish yesterday and nauseated today.   The history is provided by the patient.    Past Medical History:  Diagnosis Date  . Allergy   . Arthritis   . Hyperlipidemia   . Hypertension   . Hypokalemia   . Joint pain     Patient Active Problem List   Diagnosis Date Noted  . Taking multiple medications for chronic disease 07/30/2015  . Bronchitis 07/01/2015  . Reactive airway disease 07/01/2015  . Allergic sinusitis 07/01/2015  . Essential hypertension 07/01/2015  . Valvular heart disease 07/01/2015    Past Surgical History:  Procedure Laterality Date  . BACK SURGERY    . JOINT REPLACEMENT         Home Medications    Prior to Admission medications   Medication Sig Start Date End Date Taking? Authorizing Provider  amoxicillin-clavulanate (AUGMENTIN) 875-125 MG tablet Take 1 tablet by mouth 2 (two) times daily. 01/26/18   Juline Patch, MD  aspirin 325 MG tablet Take 325 mg by mouth at bedtime.     [provider]  celecoxib (CELEBREX) 200 MG capsule TAKE 1 CAPSULE BY MOUTH ONCE DAILY 06/04/18   Juline Patch, MD  diltiazem (CARDIZEM CD) 120 MG 24 hr capsule Take 1 capsule (120 mg total) by mouth daily. 01/28/17   Darel Hong, MD  fexofenadine (ALLEGRA) 180 MG tablet Take 1 tablet (180 mg total) by mouth daily. 07/26/16   Juline Patch, MD  fluticasone (FLONASE) 50 MCG/ACT nasal spray INSTILL 1 SPRAY INTO EACH NOSTRIL ONCE DAILY 02/20/18   Juline Patch, MD  guaiFENesin-codeine (ROBITUSSIN AC) 100-10 MG/5ML syrup Take 5 mLs by mouth 3 (three) times daily as needed for cough. 01/26/18    Juline Patch, MD  lisinopril (PRINIVIL,ZESTRIL) 20 MG tablet Take 20 mg by mouth 2 (two) times daily. Dr Nehemiah Massed    [provider]  montelukast (SINGULAIR) 10 MG tablet TAKE 1 TABLET BY MOUTH ONCE DAILY 06/11/18   Juline Patch, MD  Multiple Vitamins-Minerals (MULTIVITAMIN WITH MINERALS) tablet Take 1 tablet by mouth daily.    [provider]  ondansetron (ZOFRAN ODT) 8 MG disintegrating tablet Take 1 tablet (8 mg total) by mouth every 8 (eight) hours as needed. 07/28/18   Norval Gable, MD  potassium chloride SA (K-DUR,KLOR-CON) 20 MEQ tablet TAKE 3 TABLETS BY MOUTH ONCE DAILY 06/28/18   Juline Patch, MD  ranitidine (ZANTAC) 150 MG tablet TAKE 1 TABLET BY MOUTH IN THE EVENING 06/11/18   Juline Patch, MD  spironolactone (ALDACTONE) 25 MG tablet Take 25 mg by mouth daily.    [provider]  sucralfate (CARAFATE) 1 g tablet Take 1 tablet (1 g total) by mouth 4 (four) times daily. 01/25/17   Juline Patch, MD  sulfamethoxazole-trimethoprim (BACTRIM DS,SEPTRA DS) 800-160 MG tablet Take 1 tablet by mouth 2 (two) times daily. 07/28/18   Norval Gable, MD  vitamin C (ASCORBIC ACID) 500 MG tablet Take 500 mg by mouth daily.    [provider]    Family History Family History  Problem Relation Age of Onset  . Cancer Mother   . Cancer Father   . Cancer Brother   . Hyperlipidemia Brother     Social History Social History   Tobacco Use  . Smoking status: Never Smoker  . Smokeless tobacco: Current User    Types: Chew  Substance Use Topics  . Alcohol use: No  . Drug use: Not on file     Allergies   Mobic [meloxicam]   Review of Systems Review of Systems   Physical Exam Triage Vital Signs ED Triage Vitals  Enc Vitals Group     BP 07/28/18 1313 (!) 110/91     Pulse Rate 07/28/18 1313 77     Resp 07/28/18 1313 18     Temp 07/28/18 1313 99.1 F (37.3 C)     Temp Source 07/28/18 1313 Oral     SpO2 07/28/18 1313 100 %     Weight  07/28/18 1314 235 lb (106.6 kg)     Height 07/28/18 1314 6\' 1"  (1.854 m)     Head Circumference --      Peak Flow --      Pain Score 07/28/18 1314 4     Pain Loc --      Pain Edu? --      Excl. in [place]? --    No data found.  Updated Vital Signs BP (!) 110/91 (BP Location: Left Arm)   Pulse 77   Temp 99.1 F (37.3 C) (Oral)   Resp 18   Ht 6\' 1"  (1.854 m)   Wt 106.6 kg   SpO2 100%   BMI 31.00 kg/m   Visual Acuity Right Eye Distance:   Left Eye Distance:   Bilateral Distance:    Right Eye Near:   Left Eye Near:    Bilateral Near:     Physical Exam  Constitutional: He appears well-developed and well-nourished. No distress.  Abdominal: Soft. Bowel sounds are normal. He exhibits no distension and no mass. There is no tenderness. There is no rebound and no guarding. No hernia.  Genitourinary: Prostate is enlarged and tender.  Skin: He is not diaphoretic.  Nursing note and vitals reviewed.    UC Treatments / Results  Labs (all labs ordered are listed, but only abnormal results are displayed) Labs Reviewed  URINALYSIS, COMPLETE (UACMP) WITH MICROSCOPIC - Abnormal; Notable for the following components:      Result Value   Hgb urine dipstick TRACE (*)    All other components within normal limits    EKG None  Radiology No results found.  Procedures Procedures (including critical care time)  Medications Ordered in UC Medications - No data to display  Initial Impression / Assessment and Plan / UC Course  I have reviewed the triage vital signs and the nursing notes.  Pertinent labs & imaging results that were available during my care of the patient were reviewed by me and considered in my medical decision making (see chart for details).      Final Clinical Impressions(s) / UC Diagnoses   Final diagnoses:  Acute prostatitis     Discharge Instructions     Increase water intake    ED Prescriptions    Medication Sig Dispense Auth. Provider    sulfamethoxazole-trimethoprim (BACTRIM DS,SEPTRA DS) 800-160 MG tablet Take 1 tablet by mouth 2 (two) times daily. 56 tablet Kengo Sturges, MD   ondansetron (ZOFRAN ODT) 8 MG disintegrating tablet Take 1 tablet (8 mg total) by mouth every 8 (eight)  hours as needed. 6 tablet Norval Gable, MD     1. Lab results and diagnosis reviewed with patient 2. rx as per orders above; reviewed possible side effects, interactions, risks and benefits  3. Recommend supportive treatment as above 4. Follow-up prn if symptoms worsen or don't improve  Controlled Substance Prescriptions [place] Controlled Substance Registry consulted? Not Applicable   Norval Gable, MD 07/28/18 [phone]

## 2018-07-28 NOTE — Discharge Instructions (Signed)
Increase water intake

## 2018-07-28 NOTE — ED Triage Notes (Signed)
Pt with right flank pain and urinary frequency x past few days. "I think I had a fever last night." Pain 4/10

## 2018-08-01 ENCOUNTER — Ambulatory Visit: Payer: BC Managed Care – PPO | Admitting: Family Medicine

## 2018-08-01 ENCOUNTER — Encounter: Payer: Self-pay | Admitting: Family Medicine

## 2018-08-01 VITALS — BP 122/83 | HR 78 | Temp 98.3°F | Resp 16 | Ht 73.0 in | Wt 237.5 lb

## 2018-08-01 DIAGNOSIS — N410 Acute prostatitis: Secondary | ICD-10-CM

## 2018-08-01 LAB — POCT URINALYSIS DIPSTICK
Bilirubin, UA: NEGATIVE
Blood, UA: NEGATIVE
GLUCOSE UA: NEGATIVE
Ketones, UA: NEGATIVE
Nitrite, UA: NEGATIVE
Protein, UA: NEGATIVE
SPEC GRAV UA: 1.01 (ref 1.010–1.025)
Urobilinogen, UA: 0.2 E.U./dL
pH, UA: 6 (ref 5.0–8.0)

## 2018-08-01 MED ORDER — CIPROFLOXACIN HCL 500 MG PO TABS: 500.0000 mg | ORAL_TABLET | Freq: Two times a day (BID) | ORAL | 0 refills | Status: DC

## 2018-08-01 NOTE — Progress Notes (Signed)
Date:  08/01/2018   Name:  [name]   DOB:  April 27, 1956   MRN:  [id number]   Chief Complaint: Prostatitis (seen [place] Saturday and given Bactrim but no better )  Urinary Tract Infection   This is a new problem. The current episode started in the past 7 days (8 days ago). The problem occurs every urination. The problem has been unchanged. The quality of the pain is described as aching and burning. The pain is at a severity of 2/10. The pain is mild. There has been no fever. He is not sexually active. Associated symptoms include chills, flank pain, frequency and sweats. Pertinent negatives include no discharge, hematuria, hesitancy, nausea, urgency or vomiting. He has tried nothing for the symptoms. The treatment provided mild relief. His past medical history is significant for kidney stones. previouas prostatitis     Review of Systems  Constitutional: Positive for chills. Negative for fever.  HENT: Negative for drooling, ear discharge, ear pain and sore throat.   Respiratory: Negative for cough, shortness of breath and wheezing.   Cardiovascular: Negative for chest pain, palpitations and leg swelling.  Gastrointestinal: Negative for abdominal pain, blood in stool, constipation, diarrhea, nausea and vomiting.  Endocrine: Negative for polydipsia.  Genitourinary: Positive for flank pain and frequency. Negative for difficulty urinating, dysuria, hematuria, hesitancy, testicular pain and urgency.  Musculoskeletal: Negative for back pain, myalgias and neck pain.  Skin: Negative for rash.  Allergic/Immunologic: Negative for environmental allergies.  Neurological: Negative for dizziness and headaches.  Hematological: Does not bruise/bleed easily.  Psychiatric/Behavioral: Negative for suicidal ideas. The patient is not nervous/anxious.     Patient Active Problem List   Diagnosis Date Noted  . Taking multiple medications for chronic disease 07/30/2015  . Bronchitis 07/01/2015  . Reactive  airway disease 07/01/2015  . Allergic sinusitis 07/01/2015  . Essential hypertension 07/01/2015  . Valvular heart disease 07/01/2015    Allergies  Allergen Reactions  . Mobic [Meloxicam] Palpitations    Past Surgical History:  Procedure Laterality Date  . BACK SURGERY    . JOINT REPLACEMENT      Social History   Tobacco Use  . Smoking status: Never Smoker  . Smokeless tobacco: Current User    Types: Chew  Substance Use Topics  . Alcohol use: No  . Drug use: Not on file     Medication list has been reviewed and updated.  Current Meds  Medication Sig  . aspirin 325 MG tablet Take 325 mg by mouth at bedtime.   . celecoxib (CELEBREX) 200 MG capsule TAKE 1 CAPSULE BY MOUTH ONCE DAILY  . diltiazem (CARDIZEM CD) 120 MG 24 hr capsule Take 1 capsule (120 mg total) by mouth daily.  . fexofenadine (ALLEGRA) 180 MG tablet Take 1 tablet (180 mg total) by mouth daily.  . fluticasone (FLONASE) 50 MCG/ACT nasal spray INSTILL 1 SPRAY INTO EACH NOSTRIL ONCE DAILY  . lisinopril (PRINIVIL,ZESTRIL) 20 MG tablet Take 20 mg by mouth 2 (two) times daily. Dr Nehemiah Massed  . montelukast (SINGULAIR) 10 MG tablet TAKE 1 TABLET BY MOUTH ONCE DAILY  . Multiple Vitamins-Minerals (MULTIVITAMIN WITH MINERALS) tablet Take 1 tablet by mouth daily.  . ondansetron (ZOFRAN ODT) 8 MG disintegrating tablet Take 1 tablet (8 mg total) by mouth every 8 (eight) hours as needed.  . potassium chloride SA (K-DUR,KLOR-CON) 20 MEQ tablet TAKE 3 TABLETS BY MOUTH ONCE DAILY  . ranitidine (ZANTAC) 150 MG tablet TAKE 1 TABLET BY MOUTH IN THE EVENING  .  spironolactone (ALDACTONE) 25 MG tablet Take 25 mg by mouth daily.  . sucralfate (CARAFATE) 1 g tablet Take 1 tablet (1 g total) by mouth 4 (four) times daily.  Marland Kitchen sulfamethoxazole-trimethoprim (BACTRIM DS,SEPTRA DS) 800-160 MG tablet Take 1 tablet by mouth 2 (two) times daily.  . [DISCONTINUED] vitamin C (ASCORBIC ACID) 500 MG tablet Take 500 mg by mouth daily.    PHQ 2/9  Scores 12/14/2017 04/08/2016 02/11/2016 06/19/2015  PHQ - 2 Score 0 0 0 0  PHQ- 9 Score 0 - - -    Physical Exam  Constitutional: He is oriented to person, place, and time.  HENT:  Head: Normocephalic.  Right Ear: External ear normal.  Left Ear: External ear normal.  Nose: Nose normal.  Mouth/Throat: Oropharynx is clear and moist.  Eyes: Pupils are equal, round, and reactive to light. Conjunctivae and EOM are normal. Right eye exhibits no discharge. Left eye exhibits no discharge. No scleral icterus.  Neck: Normal range of motion. Neck supple. No JVD present. No tracheal deviation present. No thyromegaly present.  Cardiovascular: Normal rate, regular rhythm, normal heart sounds and intact distal pulses. Exam reveals no gallop and no friction rub.  No murmur heard. Pulmonary/Chest: Breath sounds normal. No respiratory distress. He has no wheezes. He has no rales.  Abdominal: Soft. Bowel sounds are normal. He exhibits no mass. There is no hepatosplenomegaly. There is no tenderness. There is no rebound, no guarding and no CVA tenderness.  Genitourinary: Rectum normal. Prostate is enlarged and tender.  Genitourinary Comments: boggy  Musculoskeletal: Normal range of motion. He exhibits no edema or tenderness.  Lymphadenopathy:    He has no cervical adenopathy.  Neurological: He is alert and oriented to person, place, and time. He has normal strength and normal reflexes. No cranial nerve deficit.  Skin: Skin is warm. No rash noted.  Nursing note and vitals reviewed.   BP 122/83   Pulse 78   Temp 98.3 F (36.8 C)   Resp 16   Ht 6\' 1"  (1.854 m)   Wt 237 lb 8 oz (107.7 kg)   SpO2 98%   BMI 31.33 kg/m   Assessment and Plan:  1. Acute prostatitis Acute. Persistent vs partially treated on Septra DS. Will initiate Cipro 500mg  bid for 2 weeks. Discuss monitir for tendon discomfort. If not improved by Thursday, next step Urology consult - ciprofloxacin (CIPRO) 500 MG tablet; Take 1 tablet  (500 mg total) by mouth 2 (two) times daily.  Dispense: 28 tablet; Refill: 0 - POCT urinalysis dipstick   Dr. Otilio Miu [facility]  08/01/2018

## 2018-08-07 ENCOUNTER — Encounter: Payer: Self-pay | Admitting: Family Medicine

## 2018-08-07 ENCOUNTER — Ambulatory Visit: Payer: BC Managed Care – PPO | Admitting: Family Medicine

## 2018-08-07 VITALS — BP 120/80 | HR 76 | Ht 73.0 in | Wt 236.0 lb

## 2018-08-07 DIAGNOSIS — N410 Acute prostatitis: Secondary | ICD-10-CM | POA: Diagnosis not present

## 2018-08-07 DIAGNOSIS — K219 Gastro-esophageal reflux disease without esophagitis: Secondary | ICD-10-CM

## 2018-08-07 MED ORDER — PANTOPRAZOLE SODIUM 40 MG PO TBEC: 40.0000 mg | DELAYED_RELEASE_TABLET | Freq: Every day | ORAL | 3 refills | Status: DC

## 2018-08-07 NOTE — Progress Notes (Signed)
Date:  08/07/2018   Name:  [name]   DOB:  [birth year]   MRN:  [id number]   Chief Complaint: Follow-up (prostatitis recheck) Male GU Problem  The patient's pertinent negatives include no genital injury, genital itching, genital lesions, pelvic pain, penile discharge, penile pain, priapism, scrotal swelling or testicular pain. This is a new problem. The current episode started in the past 7 days. The problem occurs daily. The problem has been gradually improving (plateau). The pain is mild. Pertinent negatives include no abdominal pain, anorexia, chest pain, chills, constipation, coughing, diarrhea, discolored urine, dysuria, fever, flank pain, frequency, headaches, hematuria, hesitancy, joint pain, joint swelling, nausea, painful intercourse, rash, shortness of breath, sore throat, urgency, urinary retention or vomiting. He has tried antibiotics (cipro) for the symptoms. The treatment provided moderate relief. His past medical history is significant for prostatitis. There is no history of BPH, chlamydia or HIV.     Review of Systems  Constitutional: Negative for appetite change, chills, fatigue, fever and unexpected weight change.  HENT: Negative for ear pain, facial swelling, hearing loss, nosebleeds, sneezing, sore throat and trouble swallowing.   Eyes: Negative for photophobia, pain, discharge, redness, itching and visual disturbance.  Respiratory: Negative for cough, choking, chest tightness, shortness of breath and wheezing.   Cardiovascular: Negative for chest pain, palpitations and leg swelling.  Gastrointestinal: Negative for abdominal pain, anorexia, blood in stool, constipation, diarrhea, nausea, rectal pain and vomiting.  Endocrine: Negative for cold intolerance, heat intolerance, polydipsia, polyphagia and polyuria.  Genitourinary: Negative for decreased urine volume, difficulty urinating, discharge, dysuria, flank pain, frequency, hematuria, hesitancy, pelvic pain, penile  pain, penile swelling, scrotal swelling, testicular pain and urgency.  Musculoskeletal: Negative for arthralgias, back pain, joint pain, joint swelling, myalgias, neck pain and neck stiffness.  Skin: Negative for color change and rash.  Allergic/Immunologic: Negative for immunocompromised state.  Neurological: Negative for dizziness, tremors, seizures, syncope, speech difficulty, weakness, light-headedness, numbness and headaches.  Hematological: Does not bruise/bleed easily.  Psychiatric/Behavioral: Negative for agitation, behavioral problems, confusion, dysphoric mood, hallucinations, self-injury and suicidal ideas. The patient is not nervous/anxious.     Patient Active Problem List   Diagnosis Date Noted  . Taking multiple medications for chronic disease 07/30/2015  . Bronchitis 07/01/2015  . Reactive airway disease 07/01/2015  . Allergic sinusitis 07/01/2015  . Essential hypertension 07/01/2015  . Valvular heart disease 07/01/2015    Allergies  Allergen Reactions  . Mobic [Meloxicam] Palpitations    Past Surgical History:  Procedure Laterality Date  . BACK SURGERY    . JOINT REPLACEMENT      Social History   Tobacco Use  . Smoking status: Never Smoker  . Smokeless tobacco: Current User    Types: Chew  Substance Use Topics  . Alcohol use: No  . Drug use: Not on file     Medication list has been reviewed and updated.  Current Meds  Medication Sig  . aspirin 325 MG tablet Take 325 mg by mouth at bedtime.   . celecoxib (CELEBREX) 200 MG capsule TAKE 1 CAPSULE BY MOUTH ONCE DAILY  . diltiazem (CARDIZEM CD) 120 MG 24 hr capsule Take 1 capsule (120 mg total) by mouth daily.  . fexofenadine (ALLEGRA) 180 MG tablet Take 1 tablet (180 mg total) by mouth daily.  . fluticasone (FLONASE) 50 MCG/ACT nasal spray INSTILL 1 SPRAY INTO EACH NOSTRIL ONCE DAILY  . lisinopril (PRINIVIL,ZESTRIL) 20 MG tablet Take 20 mg by mouth 2 (two) times daily. Dr Nehemiah Massed  .  montelukast  (SINGULAIR) 10 MG tablet TAKE 1 TABLET BY MOUTH ONCE DAILY  . Multiple Vitamins-Minerals (MULTIVITAMIN WITH MINERALS) tablet Take 1 tablet by mouth daily.  . potassium chloride SA (K-DUR,KLOR-CON) 20 MEQ tablet TAKE 3 TABLETS BY MOUTH ONCE DAILY  . spironolactone (ALDACTONE) 25 MG tablet Take 25 mg by mouth daily.  . sucralfate (CARAFATE) 1 g tablet Take 1 tablet (1 g total) by mouth 4 (four) times daily.  . [DISCONTINUED] ciprofloxacin (CIPRO) 500 MG tablet Take 1 tablet (500 mg total) by mouth 2 (two) times daily.    PHQ 2/9 Scores 12/14/2017 04/08/2016 02/11/2016 06/19/2015  PHQ - 2 Score 0 0 0 0  PHQ- 9 Score 0 - - -    Physical Exam  Constitutional: He is oriented to person, place, and time.  HENT:  Head: Normocephalic.  Right Ear: External ear normal.  Left Ear: External ear normal.  Nose: Nose normal.  Mouth/Throat: Oropharynx is clear and moist.  Eyes: Pupils are equal, round, and reactive to light. Conjunctivae and EOM are normal. Right eye exhibits no discharge. Left eye exhibits no discharge. No scleral icterus.  Neck: Normal range of motion. Neck supple. No JVD present. No tracheal deviation present. No thyromegaly present.  Cardiovascular: Normal rate, regular rhythm, normal heart sounds and intact distal pulses. Exam reveals no gallop and no friction rub.  No murmur heard. Pulmonary/Chest: Breath sounds normal. No respiratory distress. He has no wheezes. He has no rales.  Abdominal: Soft. Bowel sounds are normal. He exhibits no mass. There is no hepatosplenomegaly. There is no tenderness. There is no rebound, no guarding and no CVA tenderness.  Genitourinary: Rectum normal and prostate normal. Rectal exam shows no mass and no tenderness. Prostate is not enlarged and not tender.  Musculoskeletal: Normal range of motion. He exhibits no edema or tenderness.  Lymphadenopathy:    He has no cervical adenopathy.  Neurological: He is alert and oriented to person, place, and time. He  has normal strength and normal reflexes. No cranial nerve deficit.  Skin: Skin is warm. No rash noted.  Nursing note and vitals reviewed.   BP 120/80   Pulse 76   Ht 6\' 1"  (1.854 m)   Wt 236 lb (107 kg)   BMI 31.14 kg/m   Assessment and Plan: 1. Acute prostatitis Continue Cipro 500 mg/ bid (error on discontiuance)/Exan c/w improvement on cipro  2. Gastroesophageal reflux disease, esophagitis presence not specified Chronic. Recurrence since stoppage of zantac. Will discontinue ranitidine and temporarily replace with pantoprazol 40 mg daily for 30 days then resume Ranitidine. - pantoprazole (PROTONIX) 40 MG tablet; Take 1 tablet (40 mg total) by mouth daily.  Dispense: 30 tablet; Refill: 3  Dr. Mac[facility]  08/07/2018

## 2018-09-03 ENCOUNTER — Other Ambulatory Visit: Payer: Self-pay | Admitting: Family Medicine

## 2018-09-03 DIAGNOSIS — M1712 Unilateral primary osteoarthritis, left knee: Secondary | ICD-10-CM

## 2018-09-03 DIAGNOSIS — M51369 Other intervertebral disc degeneration, lumbar region without mention of lumbar back pain or lower extremity pain: Secondary | ICD-10-CM

## 2018-09-03 DIAGNOSIS — M5136 Other intervertebral disc degeneration, lumbar region: Secondary | ICD-10-CM

## 2018-09-16 ENCOUNTER — Other Ambulatory Visit: Payer: Self-pay | Admitting: Family Medicine

## 2018-09-16 DIAGNOSIS — J302 Other seasonal allergic rhinitis: Secondary | ICD-10-CM

## 2018-09-18 ENCOUNTER — Encounter: Payer: Self-pay | Admitting: Family Medicine

## 2018-09-18 ENCOUNTER — Ambulatory Visit: Payer: BC Managed Care – PPO | Admitting: Family Medicine

## 2018-09-18 VITALS — BP 130/80 | HR 76 | Ht 73.0 in | Wt 237.0 lb

## 2018-09-18 DIAGNOSIS — Z23 Encounter for immunization: Secondary | ICD-10-CM | POA: Diagnosis not present

## 2018-09-18 DIAGNOSIS — J0100 Acute maxillary sinusitis, unspecified: Secondary | ICD-10-CM

## 2018-09-18 MED ORDER — PREDNISONE 10 MG PO TABS: ORAL_TABLET | ORAL | 0 refills | Status: DC

## 2018-09-18 MED ORDER — AMOXICILLIN-POT CLAVULANATE 875-125 MG PO TABS: 1.0000 | ORAL_TABLET | Freq: Two times a day (BID) | ORAL | 0 refills | Status: DC

## 2018-09-18 NOTE — Progress Notes (Signed)
Date:  09/18/2018   Name:  [name]   DOB:  09/10/1956   MRN:  [id number]   Chief Complaint: Sinusitis (sinus pressure and drainage, no cough. Taking allergy meds and Nyquil at night- helps to keep it from getting worse, but it isn't better. ) and Flu Vaccine  Sinusitis  This is a new problem. The current episode started 1 to 4 weeks ago. The problem has been gradually worsening since onset. There has been no fever. The pain is mild. Associated symptoms include congestion, sinus pressure, sneezing and a sore throat. Pertinent negatives include no chills, coughing, diaphoresis, ear pain, headaches, hoarse voice, neck pain, shortness of breath or swollen glands. Past treatments include oral decongestants. The treatment provided no relief.     Review of Systems  Constitutional: Negative for chills, diaphoresis and fever.  HENT: Positive for congestion, sinus pressure, sneezing and sore throat. Negative for drooling, ear discharge, ear pain and hoarse voice.   Respiratory: Negative for cough, shortness of breath and wheezing.   Cardiovascular: Negative for chest pain, palpitations and leg swelling.  Gastrointestinal: Negative for abdominal pain, blood in stool, constipation, diarrhea and nausea.  Endocrine: Negative for polydipsia.  Genitourinary: Negative for dysuria, frequency, hematuria and urgency.  Musculoskeletal: Negative for back pain, myalgias and neck pain.  Skin: Negative for rash.  Allergic/Immunologic: Negative for environmental allergies.  Neurological: Negative for dizziness and headaches.  Hematological: Does not bruise/bleed easily.  Psychiatric/Behavioral: Negative for suicidal ideas. The patient is not nervous/anxious.     Patient Active Problem List   Diagnosis Date Noted  . Taking multiple medications for chronic disease 07/30/2015  . Bronchitis 07/01/2015  . Reactive airway disease 07/01/2015  . Allergic sinusitis 07/01/2015  . Essential hypertension  07/01/2015  . Valvular heart disease 07/01/2015    Allergies  Allergen Reactions  . Mobic [Meloxicam] Palpitations    Past Surgical History:  Procedure Laterality Date  . BACK SURGERY    . JOINT REPLACEMENT      Social History   Tobacco Use  . Smoking status: Never Smoker  . Smokeless tobacco: Current User    Types: Chew  Substance Use Topics  . Alcohol use: No  . Drug use: Not on file     Medication list has been reviewed and updated.  Current Meds  Medication Sig  . aspirin 325 MG tablet Take 325 mg by mouth at bedtime.   . celecoxib (CELEBREX) 200 MG capsule TAKE 1 CAPSULE BY MOUTH ONCE DAILY  . diltiazem (CARDIZEM CD) 120 MG 24 hr capsule Take 1 capsule (120 mg total) by mouth daily.  . fexofenadine (ALLEGRA) 180 MG tablet Take 1 tablet (180 mg total) by mouth daily.  . fluticasone (FLONASE) 50 MCG/ACT nasal spray INSTILL 1 SPRAY INTO EACH NOSTRIL ONCE DAILY  . lisinopril (PRINIVIL,ZESTRIL) 20 MG tablet Take 20 mg by mouth 2 (two) times daily. Dr [name]  . montelukast (SINGULAIR) 10 MG tablet TAKE 1 TABLET BY MOUTH ONCE DAILY  . Multiple Vitamins-Minerals (MULTIVITAMIN WITH MINERALS) tablet Take 1 tablet by mouth daily.  . pantoprazole (PROTONIX) 40 MG tablet Take 1 tablet (40 mg total) by mouth daily.  . potassium chloride SA (K-DUR,KLOR-CON) 20 MEQ tablet TAKE 3 TABLETS BY MOUTH ONCE DAILY  . spironolactone (ALDACTONE) 25 MG tablet Take 25 mg by mouth daily.  . sucralfate (CARAFATE) 1 g tablet Take 1 tablet (1 g total) by mouth 4 (four) times daily.    PHQ 2/9 Scores 12/14/2017 04/08/2016 02/11/2016  06/19/2015  PHQ - 2 Score 0 0 0 0  PHQ- 9 Score 0 - - -    Physical Exam  Constitutional: He is oriented to person, place, and time.  HENT:  Head: Normocephalic.  Right Ear: External ear normal.  Left Ear: External ear normal.  Nose: Nose normal.  Mouth/Throat: Oropharynx is clear and moist.  Eyes: Pupils are equal, round, and reactive to light. Conjunctivae  and EOM are normal. Right eye exhibits no discharge. Left eye exhibits no discharge. No scleral icterus.  Neck: Normal range of motion. Neck supple. No JVD present. No tracheal deviation present. No thyromegaly present.  Cardiovascular: Normal rate, regular rhythm, normal heart sounds and intact distal pulses. Exam reveals no gallop and no friction rub.  No murmur heard. Pulmonary/Chest: Breath sounds normal. No respiratory distress. He has no wheezes. He has no rales.  Abdominal: Soft. Bowel sounds are normal. He exhibits no mass. There is no hepatosplenomegaly. There is no tenderness. There is no rebound, no guarding and no CVA tenderness.  Musculoskeletal: Normal range of motion. He exhibits no edema or tenderness.  Lymphadenopathy:    He has no cervical adenopathy.  Neurological: He is alert and oriented to person, place, and time. He has normal strength and normal reflexes. No cranial nerve deficit.  Skin: Skin is warm. No rash noted.  Nursing note and vitals reviewed.   BP 130/80   Pulse 76   Ht 6\' 1"  (1.854 m)   Wt 237 lb (107.5 kg)   BMI 31.27 kg/m   Assessment and Plan:  1. Acute maxillary sinusitis, recurrence not specified Acute/ start Augmentin- RTC as needed - amoxicillin-clavulanate (AUGMENTIN) 875-125 MG tablet; Take 1 tablet by mouth 2 (two) times daily.  Dispense: 20 tablet; Refill: 0  2. Flu vaccine need Discussed and administered - Flu Vaccine QUAD 6+ mos PF IM (Fluarix Quad PF)  Dr. Otilio York [facility]  09/18/2018

## 2018-10-05 ENCOUNTER — Other Ambulatory Visit: Payer: Self-pay | Admitting: Family Medicine

## 2018-10-05 DIAGNOSIS — E876 Hypokalemia: Secondary | ICD-10-CM

## 2018-11-27 ENCOUNTER — Ambulatory Visit: Payer: BC Managed Care – PPO | Admitting: Family Medicine

## 2018-11-27 ENCOUNTER — Other Ambulatory Visit: Payer: Self-pay

## 2018-11-27 ENCOUNTER — Encounter: Payer: Self-pay | Admitting: Family Medicine

## 2018-11-27 VITALS — BP 128/81 | HR 77 | Temp 99.2°F | Resp 16 | Ht 73.0 in | Wt 233.0 lb

## 2018-11-27 DIAGNOSIS — R05 Cough: Secondary | ICD-10-CM | POA: Diagnosis not present

## 2018-11-27 DIAGNOSIS — R059 Cough, unspecified: Secondary | ICD-10-CM

## 2018-11-27 DIAGNOSIS — J0100 Acute maxillary sinusitis, unspecified: Secondary | ICD-10-CM | POA: Diagnosis not present

## 2018-11-27 MED ORDER — GUAIFENESIN-CODEINE 100-10 MG/5ML PO SYRP: 5.0000 mL | ORAL_SOLUTION | Freq: Four times a day (QID) | ORAL | 0 refills | Status: DC | PRN

## 2018-11-27 MED ORDER — AMOXICILLIN-POT CLAVULANATE 875-125 MG PO TABS: 1.0000 | ORAL_TABLET | Freq: Two times a day (BID) | ORAL | 0 refills | Status: DC

## 2018-11-27 NOTE — Progress Notes (Signed)
Date:  11/27/2018   Name:  [name]   DOB:  24-Dec-1955   MRN:  [id number]   Chief Complaint: Cough (hot and cold a lot but coughing and feels like bronchitis is here. )  Cough  This is a new problem. The current episode started yesterday. The problem has been waxing and waning. The cough is productive of purulent sputum (yellow/green). Associated symptoms include chest pain, chills, a fever, nasal congestion, postnasal drip, rhinorrhea, a sore throat and sweats. Pertinent negatives include no ear congestion, ear pain, headaches, heartburn, hemoptysis, myalgias, rash, shortness of breath, weight loss or wheezing. He has tried nothing for the symptoms. His past medical history is significant for bronchitis. There is no history of environmental allergies.  Sinusitis  This is a new problem. The current episode started in the past 7 days. The maximum temperature recorded prior to his arrival was 100.4 - 100.9 F. The pain is mild. Associated symptoms include chills, congestion, coughing and a sore throat. Pertinent negatives include no ear pain, headaches, neck pain or shortness of breath.  Fever   This is a new problem. The current episode started yesterday. The maximum temperature noted was 100 to 100.9 F. The temperature was taken using an oral thermometer. Associated symptoms include chest pain, congestion, coughing, muscle aches and a sore throat. Pertinent negatives include no abdominal pain, diarrhea, ear pain, headaches, nausea, rash, sleepiness, urinary pain, vomiting or wheezing. The treatment provided mild relief.    Review of Systems  Constitutional: Positive for chills and fever. Negative for weight loss.  HENT: Positive for congestion, postnasal drip, rhinorrhea and sore throat. Negative for drooling, ear discharge and ear pain.   Respiratory: Positive for cough. Negative for hemoptysis, shortness of breath and wheezing.   Cardiovascular: Positive for chest pain. Negative for  palpitations and leg swelling.  Gastrointestinal: Negative for abdominal pain, blood in stool, constipation, diarrhea, heartburn, nausea and vomiting.  Endocrine: Negative for polydipsia.  Genitourinary: Negative for dysuria, frequency, hematuria and urgency.  Musculoskeletal: Negative for back pain, myalgias and neck pain.  Skin: Negative for rash.  Allergic/Immunologic: Negative for environmental allergies.  Neurological: Negative for dizziness and headaches.  Hematological: Does not bruise/bleed easily.  Psychiatric/Behavioral: Negative for suicidal ideas. The patient is not nervous/anxious.     Patient Active Problem List   Diagnosis Date Noted  . Taking multiple medications for chronic disease 07/30/2015  . Bronchitis 07/01/2015  . Reactive airway disease 07/01/2015  . Allergic sinusitis 07/01/2015  . Essential hypertension 07/01/2015  . Valvular heart disease 07/01/2015    Allergies  Allergen Reactions  . Mobic [Meloxicam] Palpitations    Past Surgical History:  Procedure Laterality Date  . BACK SURGERY    . JOINT REPLACEMENT      Social History   Tobacco Use  . Smoking status: Never Smoker  . Smokeless tobacco: Current User    Types: Chew  Substance Use Topics  . Alcohol use: No  . Drug use: Not on file     Medication list has been reviewed and updated.  Current Meds  Medication Sig  . aspirin 325 MG tablet Take 325 mg by mouth at bedtime.   . celecoxib (CELEBREX) 200 MG capsule TAKE 1 CAPSULE BY MOUTH ONCE DAILY  . diltiazem (CARDIZEM CD) 120 MG 24 hr capsule Take 1 capsule (120 mg total) by mouth daily.  . fexofenadine (ALLEGRA) 180 MG tablet Take 1 tablet (180 mg total) by mouth daily.  . fluticasone (FLONASE) 50  MCG/ACT nasal spray INSTILL 1 SPRAY INTO EACH NOSTRIL ONCE DAILY  . lisinopril (PRINIVIL,ZESTRIL) 20 MG tablet Take 20 mg by mouth 2 (two) times daily. Dr Nehemiah Massed  . montelukast (SINGULAIR) 10 MG tablet TAKE 1 TABLET BY MOUTH ONCE DAILY  .  Multiple Vitamins-Minerals (MULTIVITAMIN WITH MINERALS) tablet Take 1 tablet by mouth daily.  . potassium chloride SA (K-DUR,KLOR-CON) 20 MEQ tablet TAKE 3 TABLETS BY MOUTH ONCE DAILY  . spironolactone (ALDACTONE) 25 MG tablet Take 25 mg by mouth daily.  . [DISCONTINUED] predniSONE (DELTASONE) 10 MG tablet Taper 6,6,6,5,5,5,4,4,3,3,2,2,1,1    PHQ 2/9 Scores 12/14/2017 04/08/2016 02/11/2016 06/19/2015  PHQ - 2 Score 0 0 0 0  PHQ- 9 Score 0 - - -    Physical Exam Vitals signs and nursing note reviewed.  HENT:     Head: Normocephalic.     Jaw: There is normal jaw occlusion.     Right Ear: Hearing, tympanic membrane, ear canal and external ear normal.     Left Ear: Hearing, tympanic membrane, ear canal and external ear normal.     Nose: Nose normal.     Right Turbinates: Swollen.     Left Turbinates: Swollen.     Mouth/Throat:     Pharynx: Posterior oropharyngeal erythema present.  Eyes:     General: No scleral icterus.       Right eye: No discharge.        Left eye: No discharge.     Conjunctiva/sclera: Conjunctivae normal.     Pupils: Pupils are equal, round, and reactive to light.  Neck:     Musculoskeletal: Normal range of motion and neck supple.     Thyroid: No thyromegaly.     Vascular: No JVD.     Trachea: No tracheal deviation.  Cardiovascular:     Rate and Rhythm: Normal rate and regular rhythm.     Heart sounds: Normal heart sounds. No murmur. No friction rub. No gallop.   Pulmonary:     Effort: No respiratory distress.     Breath sounds: Normal breath sounds. No transmitted upper airway sounds. No decreased breath sounds, wheezing, rhonchi or rales.  Abdominal:     General: Bowel sounds are normal.     Palpations: Abdomen is soft. There is no mass.     Tenderness: There is no abdominal tenderness. There is no guarding or rebound.  Musculoskeletal: Normal range of motion.        General: No tenderness.  Lymphadenopathy:     Cervical: No cervical adenopathy.  Skin:     General: Skin is warm.     Findings: No rash.  Neurological:     Mental Status: He is alert and oriented to person, place, and time.     Cranial Nerves: No cranial nerve deficit.     Deep Tendon Reflexes: Reflexes are normal and symmetric.     BP 128/81   Pulse 77   Temp 99.2 F (37.3 C) (Oral)   Resp 16   Ht 6\' 1"  (1.854 m)   Wt 233 lb (105.7 kg)   SpO2 98%   BMI 30.74 kg/m   Assessment and Plan: 1. Acute maxillary sinusitis, recurrence not specified Patient had upper respiratory symptoms consistent with an exam consistent with maxillary sinusitis.  Patient was started on Augmentin 875-125 mg a day.  Because of the fever patient was checked for influenza which was negative. - amoxicillin-clavulanate (AUGMENTIN) 875-125 MG tablet; Take 1 tablet by mouth 2 (two) times daily.  Dispense:  20 tablet; Refill: 0  2. Cough Acute.  Persistent.  Initiated Robitussin-AC teaspoon 4 times a day as needed cough. - guaiFENesin-codeine (ROBITUSSIN AC) 100-10 MG/5ML syrup; Take 5 mLs by mouth 4 (four) times daily as needed for cough.  Dispense: 118 mL; Refill: 0

## 2018-12-03 ENCOUNTER — Ambulatory Visit
Admission: RE | Admit: 2018-12-03 | Discharge: 2018-12-03 | Disposition: A | Discharge status: Home / Self Care | Payer: BC Managed Care – PPO | Source: Ambulatory Visit | Attending: Family Medicine | Admitting: Family Medicine

## 2018-12-03 ENCOUNTER — Ambulatory Visit
Admission: RE | Admit: 2018-12-03 | Discharge: 2018-12-03 | Disposition: A | Discharge status: Home / Self Care | Payer: BC Managed Care – PPO | Attending: Family Medicine | Admitting: Family Medicine

## 2018-12-03 ENCOUNTER — Other Ambulatory Visit: Payer: Self-pay | Admitting: Family Medicine

## 2018-12-03 ENCOUNTER — Ambulatory Visit: Payer: BC Managed Care – PPO | Admitting: Family Medicine

## 2018-12-03 ENCOUNTER — Encounter: Payer: Self-pay | Admitting: Family Medicine

## 2018-12-03 VITALS — BP 100/60 | HR 84 | Temp 98.3°F | Ht 73.0 in | Wt 231.0 lb

## 2018-12-03 DIAGNOSIS — R05 Cough: Secondary | ICD-10-CM

## 2018-12-03 DIAGNOSIS — J0100 Acute maxillary sinusitis, unspecified: Secondary | ICD-10-CM | POA: Diagnosis not present

## 2018-12-03 DIAGNOSIS — M5136 Other intervertebral disc degeneration, lumbar region: Secondary | ICD-10-CM

## 2018-12-03 DIAGNOSIS — M1712 Unilateral primary osteoarthritis, left knee: Secondary | ICD-10-CM

## 2018-12-03 DIAGNOSIS — E860 Dehydration: Secondary | ICD-10-CM

## 2018-12-03 DIAGNOSIS — R053 Chronic cough: Secondary | ICD-10-CM

## 2018-12-03 NOTE — Progress Notes (Signed)
Date:  12/03/2018   Name:  [name]   DOB:  09-27-1956   MRN:  [id number]   Chief Complaint: Follow-up (on day 6 of Augmentin- not feeling any better/ not having any energy, gets dizzy with cough- green production, no fever)  Cough  This is a recurrent problem. The current episode started in the past 7 days. The problem has been unchanged. The problem occurs every few minutes. The cough is productive of purulent sputum (yellow/green). Associated symptoms include a fever, weight loss and wheezing. Pertinent negatives include no chest pain, chills, ear congestion, ear pain, headaches, heartburn, hemoptysis, myalgias, nasal congestion, postnasal drip, rash, rhinorrhea, sore throat, shortness of breath or sweats. Associated symptoms comments: "felt warm". Risk factors: deep inspiration. Treatments tried: augmentin  The treatment provided no relief. His past medical history is significant for bronchitis and pneumonia. There is no history of asthma, bronchiectasis, COPD, emphysema or environmental allergies.    Review of Systems  Constitutional: Positive for fever and weight loss. Negative for chills.  HENT: Negative for drooling, ear discharge, ear pain, postnasal drip, rhinorrhea and sore throat.   Respiratory: Positive for cough and wheezing. Negative for hemoptysis and shortness of breath.   Cardiovascular: Negative for chest pain, palpitations and leg swelling.  Gastrointestinal: Negative for abdominal pain, blood in stool, constipation, diarrhea, heartburn and nausea.  Endocrine: Negative for polydipsia.  Genitourinary: Negative for dysuria, frequency, hematuria and urgency.  Musculoskeletal: Negative for back pain, myalgias and neck pain.  Skin: Negative for rash.  Allergic/Immunologic: Negative for environmental allergies.  Neurological: Negative for dizziness and headaches.  Hematological: Does not bruise/bleed easily.  Psychiatric/Behavioral: Negative for suicidal ideas. The  patient is not nervous/anxious.     Patient Active Problem List   Diagnosis Date Noted  . Taking multiple medications for chronic disease 07/30/2015  . Bronchitis 07/01/2015  . Reactive airway disease 07/01/2015  . Allergic sinusitis 07/01/2015  . Essential hypertension 07/01/2015  . Valvular heart disease 07/01/2015    Allergies  Allergen Reactions  . Mobic [Meloxicam] Palpitations    Past Surgical History:  Procedure Laterality Date  . BACK SURGERY    . JOINT REPLACEMENT      Social History   Tobacco Use  . Smoking status: Never Smoker  . Smokeless tobacco: Current User    Types: Chew  Substance Use Topics  . Alcohol use: No  . Drug use: Not on file     Medication list has been reviewed and updated.  Current Meds  Medication Sig  . amoxicillin-clavulanate (AUGMENTIN) 875-125 MG tablet Take 1 tablet by mouth 2 (two) times daily.  Marland Kitchen aspirin 325 MG tablet Take 325 mg by mouth at bedtime.   . celecoxib (CELEBREX) 200 MG capsule TAKE 1 CAPSULE BY MOUTH ONCE DAILY  . diltiazem (CARDIZEM CD) 120 MG 24 hr capsule Take 1 capsule (120 mg total) by mouth daily.  . fexofenadine (ALLEGRA) 180 MG tablet Take 1 tablet (180 mg total) by mouth daily.  . fluticasone (FLONASE) 50 MCG/ACT nasal spray INSTILL 1 SPRAY INTO EACH NOSTRIL ONCE DAILY  . guaiFENesin-codeine (ROBITUSSIN AC) 100-10 MG/5ML syrup Take 5 mLs by mouth 4 (four) times daily as needed for cough.  Marland Kitchen lisinopril (PRINIVIL,ZESTRIL) 20 MG tablet Take 20 mg by mouth 2 (two) times daily. Dr Nehemiah Massed  . montelukast (SINGULAIR) 10 MG tablet TAKE 1 TABLET BY MOUTH ONCE DAILY  . Multiple Vitamins-Minerals (MULTIVITAMIN WITH MINERALS) tablet Take 1 tablet by mouth daily.  . pantoprazole (PROTONIX)  40 MG tablet Take 1 tablet (40 mg total) by mouth daily.  . potassium chloride SA (K-DUR,KLOR-CON) 20 MEQ tablet TAKE 3 TABLETS BY MOUTH ONCE DAILY  . spironolactone (ALDACTONE) 25 MG tablet Take 25 mg by mouth daily.    PHQ 2/9  Scores 12/14/2017 04/08/2016 02/11/2016 06/19/2015  PHQ - 2 Score 0 0 0 0  PHQ- 9 Score 0 - - -    Physical Exam Vitals signs and nursing note reviewed.  HENT:     Head: Normocephalic.     Right Ear: External ear normal.     Left Ear: External ear normal.     Nose: Nose normal.     Mouth/Throat:     Mouth: Mucous membranes are dry.  Eyes:     General: No scleral icterus.       Right eye: No discharge.        Left eye: No discharge.     Conjunctiva/sclera: Conjunctivae normal.     Pupils: Pupils are equal, round, and reactive to light.  Neck:     Musculoskeletal: Normal range of motion and neck supple.     Thyroid: No thyromegaly.     Vascular: No JVD.     Trachea: No tracheal deviation.  Cardiovascular:     Rate and Rhythm: Normal rate and regular rhythm.     Heart sounds: Normal heart sounds. No murmur. No friction rub. No gallop.   Pulmonary:     Effort: No respiratory distress.     Breath sounds: Normal breath sounds. No wheezing or rales.  Abdominal:     General: Bowel sounds are normal.     Palpations: Abdomen is soft. There is no mass.     Tenderness: There is no abdominal tenderness. There is no guarding or rebound.  Musculoskeletal: Normal range of motion.        General: No tenderness.  Lymphadenopathy:     Cervical: No cervical adenopathy.  Skin:    General: Skin is warm.     Findings: No rash.  Neurological:     Mental Status: He is alert and oriented to person, place, and time.     Cranial Nerves: No cranial nerve deficit.     Deep Tendon Reflexes: Reflexes are normal and symmetric.     BP 100/60   Pulse 84   Temp 98.3 F (36.8 C) (Oral)   Ht 6\' 1"  (1.854 m)   Wt 231 lb (104.8 kg)   SpO2 97%   BMI 30.48 kg/m   Assessment and Plan: 1. Acute maxillary sinusitis, recurrence not specified Persistent, without relief on current antibiotic regimen.  Patient is having  fatigue for the past several days with continued cough.  Patient does have dehydration  at this time.  Will check a CBC at this time to see if there is increased of her white count. - CBC  2. Persistent cough And has had a persistent cough and will continue his current cough regimen will check a CBC and rule out any bacterial progression will check a chest film at this time to make sure there is not a pneumonia involvement - CBC - DG Chest 2 View; Future  3. Dehydration Patient's been instructed to significantly increase his fluid  oral intake.

## 2018-12-04 LAB — CBC
HEMATOCRIT: 43.9 % (ref 37.5–51.0)
HEMOGLOBIN: 15.2 g/dL (ref 13.0–17.7)
MCH: 28.8 pg (ref 26.6–33.0)
MCHC: 34.6 g/dL (ref 31.5–35.7)
MCV: 83 fL (ref 79–97)
Platelets: 165 10*3/uL (ref 150–450)
RBC: 5.27 x10E6/uL (ref 4.14–5.80)
RDW: 14 % (ref 11.6–15.4)
WBC: 5.3 10*3/uL (ref 3.4–10.8)

## 2018-12-06 ENCOUNTER — Other Ambulatory Visit: Payer: Self-pay | Admitting: Sports Medicine

## 2018-12-06 ENCOUNTER — Other Ambulatory Visit (HOSPITAL_COMMUNITY): Payer: Self-pay | Admitting: Sports Medicine

## 2018-12-06 DIAGNOSIS — M5416 Radiculopathy, lumbar region: Secondary | ICD-10-CM

## 2018-12-18 ENCOUNTER — Ambulatory Visit
Admission: RE | Admit: 2018-12-18 | Discharge: 2018-12-18 | Disposition: A | Discharge status: Home / Self Care | Payer: BC Managed Care – PPO | Source: Ambulatory Visit | Attending: Sports Medicine | Admitting: Sports Medicine

## 2018-12-18 DIAGNOSIS — M5416 Radiculopathy, lumbar region: Secondary | ICD-10-CM | POA: Diagnosis present

## 2018-12-18 LAB — POCT I-STAT CREATININE: Creatinine, Ser: 1.1 mg/dL (ref 0.61–1.24)

## 2018-12-18 MED ORDER — GADOBUTROL 1 MMOL/ML IV SOLN
10.0000 mL | Freq: Once | INTRAVENOUS | Status: AC | PRN
  Administered 2018-12-18: 10 mL via INTRAVENOUS

## 2018-12-22 ENCOUNTER — Other Ambulatory Visit: Payer: Self-pay | Admitting: Family Medicine

## 2018-12-22 DIAGNOSIS — E876 Hypokalemia: Secondary | ICD-10-CM

## 2019-01-05 ENCOUNTER — Other Ambulatory Visit: Payer: Self-pay | Admitting: Family Medicine

## 2019-01-05 DIAGNOSIS — K219 Gastro-esophageal reflux disease without esophagitis: Secondary | ICD-10-CM

## 2019-02-04 ENCOUNTER — Other Ambulatory Visit: Payer: Self-pay

## 2019-02-04 ENCOUNTER — Ambulatory Visit
Admission: RE | Admit: 2019-02-04 | Discharge: 2019-02-04 | Disposition: A | Discharge status: Home / Self Care | Payer: BC Managed Care – PPO | Source: Ambulatory Visit | Attending: Neurosurgery | Admitting: Neurosurgery

## 2019-02-04 ENCOUNTER — Other Ambulatory Visit: Payer: Self-pay | Admitting: Neurosurgery

## 2019-02-04 DIAGNOSIS — M5126 Other intervertebral disc displacement, lumbar region: Secondary | ICD-10-CM

## 2019-02-04 MED ORDER — GADOBENATE DIMEGLUMINE 529 MG/ML IV SOLN
20.0000 mL | Freq: Once | INTRAVENOUS | Status: AC | PRN
  Administered 2019-02-04: 17:00:00 20 mL via INTRAVENOUS

## 2019-02-11 MED ORDER — SODIUM CHLORIDE 0.9 % IV SOLN
125.00 | INTRAVENOUS | Status: DC
Start: ? — End: 2019-02-11

## 2019-02-12 ENCOUNTER — Encounter: Payer: Self-pay | Admitting: Family Medicine

## 2019-02-12 ENCOUNTER — Ambulatory Visit (INDEPENDENT_AMBULATORY_CARE_PROVIDER_SITE_OTHER): Payer: BC Managed Care – PPO | Admitting: Family Medicine

## 2019-02-12 ENCOUNTER — Other Ambulatory Visit: Payer: Self-pay

## 2019-02-12 VITALS — Temp 98.6°F | Ht 73.0 in | Wt 231.0 lb

## 2019-02-12 DIAGNOSIS — N390 Urinary tract infection, site not specified: Secondary | ICD-10-CM

## 2019-02-12 DIAGNOSIS — T83511A Infection and inflammatory reaction due to indwelling urethral catheter, initial encounter: Secondary | ICD-10-CM

## 2019-02-12 DIAGNOSIS — N401 Enlarged prostate with lower urinary tract symptoms: Secondary | ICD-10-CM

## 2019-02-12 DIAGNOSIS — R3914 Feeling of incomplete bladder emptying: Secondary | ICD-10-CM | POA: Diagnosis not present

## 2019-02-12 MED ORDER — CEPHALEXIN 500 MG PO CAPS: 500.0000 mg | ORAL_CAPSULE | Freq: Three times a day (TID) | ORAL | 0 refills | Status: DC

## 2019-02-12 NOTE — Progress Notes (Signed)
Date:  02/12/2019   Name:  [name]   DOB:  04-Aug-1956   MRN:  [id number]   Chief Complaint: Follow-up (has a foley- feels like he has to go all the time, but it will go away. Had a fever 100.1 yesterday. Has been back on Bactrim since Friday with the exception of 1 day. Has about 7 days of med left)  I connected withthis patient, [name], by telephoneat the patient's home.  I verified that I am speaking with the correct person using two identifiers. This visit was conducted via telephone due to the Covid-19 outbreak from my office at [facility] in [place], [place]. I discussed the limitations, risks, security and privacy concerns of performing an evaluation and management service by telephone. I also discussed with the patient that there may be a patient responsible charge related to this service. The patient expressed understanding and agreed to proceed.  Benign Prostatic Hypertrophy  This is a new problem. The problem has been waxing and waning since onset. Irritative symptoms include frequency, nocturia and urgency. 'bashful kidney". Obstructive symptoms include dribbling, incomplete emptying, an intermittent stream, a slower stream, straining and a weak stream. diaphoresis/fever/chills. Associated symptoms include chills. Pertinent negatives include no dysuria, genital pain, hematuria, hesitancy, nausea or vomiting. Exacerbated by: no opiate,bendryl/  taking allegra. Treatments tried: cath in place.    Review of Systems  Constitutional: Positive for chills, diaphoresis and fever.  Gastrointestinal: Negative for nausea and vomiting.  Genitourinary: Positive for frequency, incomplete emptying, nocturia and urgency. Negative for dysuria, flank pain, hematuria, hesitancy and scrotal swelling.    Patient Active Problem List   Diagnosis Date Noted  . Taking multiple medications for chronic disease 07/30/2015  . Bronchitis 07/01/2015  . Reactive airway disease 07/01/2015   . Allergic sinusitis 07/01/2015  . Essential hypertension 07/01/2015  . Valvular heart disease 07/01/2015    Allergies  Allergen Reactions  . Mobic [Meloxicam] Palpitations    Past Surgical History:  Procedure Laterality Date  . BACK SURGERY    . JOINT REPLACEMENT      Social History   Tobacco Use  . Smoking status: Never Smoker  . Smokeless tobacco: Current User    Types: Chew  Substance Use Topics  . Alcohol use: No  . Drug use: Not on file     Medication list has been reviewed and updated.  Current Meds  Medication Sig  . aspirin 325 MG tablet Take 325 mg by mouth at bedtime.   . celecoxib (CELEBREX) 200 MG capsule TAKE 1 CAPSULE BY MOUTH ONCE DAILY  . diltiazem (CARDIZEM CD) 120 MG 24 hr capsule Take 1 capsule (120 mg total) by mouth daily. (Patient taking differently: Take 120 mg by mouth 2 (two) times daily. Dr Nehemiah Massed)  . fexofenadine (ALLEGRA) 180 MG tablet Take 1 tablet (180 mg total) by mouth daily.  . fluticasone (FLONASE) 50 MCG/ACT nasal spray INSTILL 1 SPRAY INTO EACH NOSTRIL ONCE DAILY  . lisinopril (PRINIVIL,ZESTRIL) 20 MG tablet Take 20 mg by mouth daily. Dr Nehemiah Massed  . montelukast (SINGULAIR) 10 MG tablet TAKE 1 TABLET BY MOUTH ONCE DAILY  . Multiple Vitamins-Minerals (MULTIVITAMIN WITH MINERALS) tablet Take 1 tablet by mouth daily.  . pantoprazole (PROTONIX) 40 MG tablet Take 1 tablet by mouth once daily  . potassium chloride SA (K-DUR,KLOR-CON) 20 MEQ tablet Take 3 tablets by mouth once daily  . spironolactone (ALDACTONE) 25 MG tablet Take 25 mg by mouth daily.    PHQ 2/9  Scores 12/14/2017 04/08/2016 02/11/2016 06/19/2015  PHQ - 2 Score 0 0 0 0  PHQ- 9 Score 0 - - -    BP Readings from Last 3 Encounters:  12/03/18 100/60  11/27/18 128/81  09/18/18 130/80    Physical Exam Vitals signs and nursing note reviewed.  Constitutional:      General: He is not in acute distress.    Appearance: Normal appearance.     Comments: limited  Pulmonary:      Breath sounds: No wheezing.  Neurological:     Mental Status: He is alert.     Wt Readings from Last 3 Encounters:  02/12/19 231 lb (104.8 kg)  12/03/18 231 lb (104.8 kg)  11/27/18 233 lb (105.7 kg)    Temp 98.6 F (37 C) (Oral)   Ht 6\' 1"  (1.854 m)   Wt 231 lb (104.8 kg)   BMI 30.48 kg/m   Assessment and Plan: 1. Urinary tract infection associated with indwelling urethral catheter, initial encounter [facility]) Relatively new onset but this is a recheck from a recent evaluation in [place].  Patient was treated with catheter placement, antibiotics, and alpha-blocker.  Patient recently had surgery on his back.  Therefore we will avoid using a Malone.  Patient was started on cephalexin 500 mg 3 times a day for 10 days until he is to be evaluated by his urologist.  Patient was instructed if there is any fever chills diaphoresis the patient is to call back and at that time will be seeking early evaluation for change of catheter and possible urine culture. - cephALEXin (KEFLEX) 500 MG capsule; Take 1 capsule (500 mg total) by mouth 3 (three) times daily.  Dispense: 30 capsule; Refill: 0  2. Benign prostatic hyperplasia with incomplete bladder emptying Patient has been having earlier symptoms of BPH which is been relatively controlled by the patient.  This may have been exacerbated by medication dehydration and/or recent inactivity from the surgery.  Patient will be seen by urology and evaluated for further treatment if necessary.  I spent 10 minutes with this patient, More than 50% of that time was spent in voice to voice education, counseling and care coordination.

## 2019-02-17 NOTE — Progress Notes (Signed)
02/18/2019 8:56 AM   [name] 05/29/1956 [id number]  Referring provider: Juline Patch, MD [address] [place], [place] [address]  Chief Complaint  Patient presents with  . Urinary Retention    HPI: [name] is a 63 year old Caucasian male with BPH with LU TS who went into urinary retention after lumbar surgery on 02/07/2019.    He was seen in [facility] ED and had a Foley placed with the return of 1L of urine on 02/10/2019.    He states prior to this he has not had issues with urinary retention.  He had seen Dr. Madelin Headings in the past for kidney stones and had two bouts of prostatitis last year which cleared with antibiotics.    His baseline urinary symptoms consist of nocturia x 1.  Patient denies any gross hematuria, dysuria or suprapubic/flank pain.  Patient denies any fevers, chills, nausea or vomiting.     PMH: Past Medical History:  Diagnosis Date  . Allergy   . Arthritis   . Hyperlipidemia   . Hypertension   . Hypokalemia   . Joint pain     Surgical History: Past Surgical History:  Procedure Laterality Date  . BACK SURGERY    . JOINT REPLACEMENT      Home Medications:  Allergies as of 02/18/2019      Reactions   Mobic [meloxicam] Palpitations      Medication List       Accurate as of February 18, 2019  8:56 AM. Always use your most recent med list.        aspirin 325 MG tablet Take 325 mg by mouth at bedtime.   celecoxib 200 MG capsule Commonly known as:  CELEBREX TAKE 1 CAPSULE BY MOUTH ONCE DAILY   cephALEXin 500 MG capsule Commonly known as:  KEFLEX Take 1 capsule (500 mg total) by mouth 3 (three) times daily.   diltiazem 120 MG 24 hr capsule Commonly known as:  Cardizem CD Take 1 capsule (120 mg total) by mouth daily.   doxycycline 100 MG capsule Commonly known as:  VIBRAMYCIN   fexofenadine 180 MG tablet Commonly known as:  ALLEGRA Take 1 tablet (180 mg total) by mouth daily.   fluorouracil 5 % cream Commonly  known as:  EFUDEX APPLY TOPICALLY TO THE AFFECTED AREA EVERY WEEK   fluticasone 50 MCG/ACT nasal spray Commonly known as:  FLONASE INSTILL 1 SPRAY INTO EACH NOSTRIL ONCE DAILY   HYDROcodone-acetaminophen 5-325 MG tablet Commonly known as:  NORCO/VICODIN   lisinopril 20 MG tablet Commonly known as:  ZESTRIL Take 20 mg by mouth daily. Dr Nehemiah Massed   methocarbamol 750 MG tablet Commonly known as:  ROBAXIN   methylPREDNISolone 4 MG Tbpk tablet Commonly known as:  [place] See admin instructions.   montelukast 10 MG tablet Commonly known as:  SINGULAIR TAKE 1 TABLET BY MOUTH ONCE DAILY   multivitamin with minerals tablet Take 1 tablet by mouth daily.   pantoprazole 40 MG tablet Commonly known as:  PROTONIX Take 1 tablet by mouth once daily   potassium chloride SA 20 MEQ tablet Commonly known as:  K-DUR Take 3 tablets by mouth once daily   spironolactone 25 MG tablet Commonly known as:  ALDACTONE Take 25 mg by mouth daily.   sucralfate 1 g tablet Commonly known as:  Carafate Take 1 tablet (1 g total) by mouth 4 (four) times daily.       Allergies:  Allergies  Allergen Reactions  .  Mobic [Meloxicam] Palpitations    Family History: Family History  Problem Relation Age of Onset  . Cancer Mother   . Cancer Father   . Cancer Brother   . Hyperlipidemia Brother     Social History:  reports that he has never smoked. His smokeless tobacco use includes chew. He reports that he does not drink alcohol or use drugs.  ROS: UROLOGY Frequent Urination?: No Hard to postpone urination?: No Burning/pain with urination?: No Get up at night to urinate?: No Leakage of urine?: No Urine stream starts and stops?: No Trouble starting stream?: No Do you have to strain to urinate?: No Blood in urine?: No Urinary tract infection?: No Sexually transmitted disease?: No Injury to kidneys or bladder?: No Painful intercourse?: No Weak stream?: No Erection problems?: No  Penile pain?: No  Gastrointestinal Nausea?: No Vomiting?: No Indigestion/heartburn?: No Diarrhea?: No Constipation?: No  Constitutional Fever: No Night sweats?: No Weight loss?: No Fatigue?: No  Skin Skin rash/lesions?: No Itching?: No  Eyes Blurred vision?: No Double vision?: No  Ears/Nose/Throat Sore throat?: No Sinus problems?: No  Hematologic/Lymphatic Swollen glands?: No Easy bruising?: No  Cardiovascular Leg swelling?: No Chest pain?: No  Respiratory Cough?: No Shortness of breath?: No  Endocrine Excessive thirst?: No  Musculoskeletal Back pain?: Yes Joint pain?: Yes  Neurological Headaches?: No Dizziness?: No  Psychologic Depression?: No Anxiety?: No  Physical Exam: BP 116/81 (BP Location: Left Arm, Patient Position: Sitting, Cuff Size: Normal)   Pulse 99   Ht 6\' 1"  (1.854 m)   Wt 230 lb (104.3 kg)   BMI 30.34 kg/m   Constitutional:  Well nourished. Alert and oriented, No acute distress. HEENT: [place] AT, moist mucus membranes.  Trachea midline, no masses. Cardiovascular: No clubbing, cyanosis, or edema. Respiratory: Normal respiratory effort, no increased work of breathing. GI: Abdomen is soft, non tender, non distended, no abdominal masses. Liver and spleen not palpable.  No hernias appreciated.  Stool sample for occult testing is not indicated.   GU: No CVA tenderness.  No bladder fullness or masses.  Patient with circumcised phallus.  Urethral meatus is patent.  No penile discharge. No penile lesions or rashes. Scrotum without lesions, cysts, rashes and/or edema.  Testicles are located scrotally bilaterally. No masses are appreciated in the testicles. Left and right epididymis are normal. Rectal: Patient with  normal sphincter tone. Anus and perineum without scarring or rashes. No rectal masses are appreciated. Prostate is approximately 55 grams, right lobe firmer than left lobe, no nodules are appreciated. Seminal vesicles are normal. Skin:  No rashes, bruises or suspicious lesions. Lymph: No cervical or inguinal adenopathy. Neurologic: Grossly intact, no focal deficits, moving all 4 extremities. Psychiatric: Normal mood and affect.  Laboratory Data: PSA 1.5 in 11/2017 Lab Results  Component Value Date   WBC 5.3 12/03/2018   HGB 15.2 12/03/2018   HCT 43.9 12/03/2018   MCV 83 12/03/2018   PLT 165 12/03/2018    Lab Results  Component Value Date   CREATININE 1.10 12/18/2018    No results found for: PSA  No results found for: TESTOSTERONE  No results found for: HGBA1C  No results found for: TSH     Component Value Date/Time   CHOL 218 (H) 12/14/2017 1110   HDL 50 12/14/2017 1110   LDLCALC 143 (H) 12/14/2017 1110    Lab Results  Component Value Date   AST 27 01/28/2017   Lab Results  Component Value Date   ALT 29 01/28/2017   No  components found for: ALKALINEPHOPHATASE No components found for: BILIRUBINTOTAL  No results found for: ESTRADIOL  Urinalysis    Component Value Date/Time   COLORURINE YELLOW 07/28/2018 [place] 07/28/2018 1319   LABSPEC 1.015 07/28/2018 1319   PHURINE 6.0 07/28/2018 1319   GLUCOSEU NEGATIVE 07/28/2018 1319   HGBUR TRACE (A) 07/28/2018 1319   BILIRUBINUR NEG 08/01/2018 [place] 07/28/2018 1319   PROTEINUR Negative 08/01/2018 1218   PROTEINUR NEGATIVE 07/28/2018 1319   UROBILINOGEN 0.2 08/01/2018 1218   NITRITE NEG 08/01/2018 1218   NITRITE NEGATIVE 07/28/2018 1319   LEUKOCYTESUR Trace (A) 08/01/2018 1218    I have reviewed the labs.   Pertinent Imaging: - n/a  Catheter Removal Patient is present today for a catheter removal.  10 ml of water was drained from the balloon. A 16 FR foley cath was removed from the bladder no complications were noted . Patient tolerated well.  Performed by: Zara Council, PA-C   Assessment & Plan:    1. Acute urinary retention:    - foley catheter removed - voiding trial today   - patient  instructed on self cathing procedures today - he is to call if he needs to CIC - otherwise, he is to follow-up in one month for I PSS score, PVR, PSA and exam.  2. Abnormal prostate exam - right lobe is firmer on today's exam, may be secondary to catheter irritation, will RTC in one month for repeat exam and PSA   3. BPH with LUTS Continue conservative management, avoiding bladder irritants and timed voiding's Most bothersome symptoms is/are urinary retention Initiate alpha-blocker (tamsulosin 0.4 mg daily) RTC in one month for IPSS, PSA, PVR and exam   Return in about 1 month (around 03/20/2019) for IPSS, exam, PSA and PVR.  These notes generated with voice recognition software. I apologize for typographical errors.  Zara Council, PA-C  [facility] [address]  [place], [place] [address] [phone]

## 2019-02-18 ENCOUNTER — Ambulatory Visit (INDEPENDENT_AMBULATORY_CARE_PROVIDER_SITE_OTHER): Payer: BC Managed Care – PPO | Admitting: Urology

## 2019-02-18 ENCOUNTER — Other Ambulatory Visit: Payer: Self-pay

## 2019-02-18 ENCOUNTER — Encounter: Payer: Self-pay | Admitting: Urology

## 2019-02-18 VITALS — BP 116/81 | HR 99 | Ht 73.0 in | Wt 230.0 lb

## 2019-02-18 DIAGNOSIS — N401 Enlarged prostate with lower urinary tract symptoms: Secondary | ICD-10-CM

## 2019-02-18 DIAGNOSIS — N138 Other obstructive and reflux uropathy: Secondary | ICD-10-CM

## 2019-02-18 DIAGNOSIS — Z87898 Personal history of other specified conditions: Secondary | ICD-10-CM | POA: Diagnosis not present

## 2019-02-18 DIAGNOSIS — R3989 Other symptoms and signs involving the genitourinary system: Secondary | ICD-10-CM | POA: Diagnosis not present

## 2019-02-18 MED ORDER — TAMSULOSIN HCL 0.4 MG PO CAPS: 0.4000 mg | ORAL_CAPSULE | Freq: Every day | ORAL | 3 refills | Status: DC

## 2019-02-18 NOTE — Patient Instructions (Signed)
    Step 1 Get all of your supplies ready and place near you. Step 2 Wash your hands, or put on gloves. Step 3 Wash around the tip of your penis with warm antibacterial soapy water. Step 4 Take catheter out of package and drain the lubricant over toilet. Step 5 While holding the penis at a 45 degree angle from the stomach in one hand and the catheter in the other hand  Step 6 Insert the catheter slowly into your urethra. If there is resistance when the catheter reaches the sphincter muscle,              take a deep breath and gently apply steady pressure.              DO NOT FORCE THE CATHETER Step 7 When the urine begins to flow insert another inch and lower penis. Allow the urine to flow into the toilet. Step 8 When the flow of urine stops, slowly remove the catheter.  Acute Urinary Retention, Male  Acute urinary retention means that you cannot pee (urinate) at all, or that you pee too little and your bladder is not emptied completely. If it is not treated, it can lead to kidney damage or other serious problems. Follow these instructions at home:  Take over-the-counter and prescription medicines only as told by your doctor. Ask your doctor what medicines you should stay away from. Do not take any medicine unless your doctor says it is okay to do so.  If you were sent home with a tube that drains the bladder (catheter), take care of it as told by your doctor.  Drink enough fluid to keep your pee clear or pale yellow.  If you were given an antibiotic, take it as told by your doctor. Do not stop taking the antibiotic even if you start to feel better.  Do not use any products that contain nicotine or tobacco, such as cigarettes and e-cigarettes. If you need help quitting, ask your doctor.  Watch for changes in your symptoms. Tell your doctor about them.  If told, track changes in your blood pressure at home. Tell your doctor about them.  Keep all follow-up visits as told by your doctor.  This is important. Contact a doctor if:  You have spasms or you leak pee when you have spasms. Get help right away if:  You have chills or a fever.  You have a tube that drains the bladder and: ? The tube stops draining pee. ? The tube falls out.  You have blood in your pee. Summary  Acute urinary retention means that you cannot pee at all, or that you pee too little and your bladder is not emptied completely. If it is not treated, it can result in kidney damage or other serious problems.  If you were sent home with a tube that drains the bladder, take care of it as told by your doctor.  Monitor any changes in your symptoms. Tell your doctor about any changes. This information is not intended to replace advice given to you by your health care provider. Make sure you discuss any questions you have with your health care provider. Document Released: 03/28/2008 Document Revised: 11/11/2016 Document Reviewed: 11/11/2016 Elsevier Interactive Patient Education  2019 [employer].

## 2019-02-18 NOTE — Progress Notes (Signed)
Continuous Intermittent Catheterization  Due to urinary retention patient is present today for a teaching of self I & O Catheterization. Patient was given detailed verbal and printed instructions of self catheterization. Patient was cleaned and prepped in a sterile fashion.  With instruction and assistance patient inserted a 14FR and urine return was noted 100 ml, urine was pale yellow in color. Patient tolerated well, no complications were noted Patient was given a sample bag with supplies to take home.  Instructions were given per Zara Council patient is only to self cath when and if necessary.    Preformed by: Gordy Clement, [place] ([place])

## 2019-02-21 ENCOUNTER — Telehealth: Payer: Self-pay | Admitting: Urology

## 2019-02-21 DIAGNOSIS — R399 Unspecified symptoms and signs involving the genitourinary system: Secondary | ICD-10-CM

## 2019-02-21 MED ORDER — SULFAMETHOXAZOLE-TRIMETHOPRIM 800-160 MG PO TABS: 1.0000 | ORAL_TABLET | Freq: Two times a day (BID) | ORAL | 0 refills | Status: AC

## 2019-02-21 NOTE — Telephone Encounter (Signed)
Called pt informed him of the information below. Pt gave verbal understanding. RX sent in.  °

## 2019-02-21 NOTE — Telephone Encounter (Signed)
Pt saw [name] this past Monday and is still having burning w/urination and frequency.  Please give pt a call to discuss.

## 2019-02-21 NOTE — Telephone Encounter (Signed)
We can send in a prescription for Bactrim DS, BID x seven days.  He needs to seek treatment in the ED if he should develop fevers, chills, gross hematuria or pain becomes intolerable.  If he does not feel better by Sunday, he should call Monday morning so that we can get him on the schedule to be seen.

## 2019-02-21 NOTE — Telephone Encounter (Signed)
Called pt he states that he is urinating every hour, he states that the burning is severe with urination. Also notes that he is uncomfortable while sitting. He believes that this is due to his prostate which he was told likely was inflamed. Please advise.

## 2019-02-21 NOTE — Telephone Encounter (Signed)
He was in urinary retention and we took his catheter out on Monday.  Is he able to urinate and does it feel like his bladder is emptying?  If not, he has catheters with him and he should go ahead and cath himself and let us know how much he gets out.   If he is urinating well on his own, we can send in a script for Septra DS, BID x seven days.  If he is still having symptoms after he completes his antibiotic, he needs to be seen in the office.

## 2019-02-21 NOTE — Telephone Encounter (Signed)
Spoke with patient he has been having burning with urination since Monday-he feels like it has increased.

## 2019-02-25 ENCOUNTER — Ambulatory Visit (INDEPENDENT_AMBULATORY_CARE_PROVIDER_SITE_OTHER): Payer: Medicare Other | Admitting: Urology

## 2019-02-25 ENCOUNTER — Encounter: Payer: Self-pay | Admitting: Urology

## 2019-02-25 ENCOUNTER — Other Ambulatory Visit: Payer: Self-pay

## 2019-02-25 VITALS — BP 104/73 | HR 106 | Ht 73.0 in | Wt 225.0 lb

## 2019-02-25 DIAGNOSIS — N401 Enlarged prostate with lower urinary tract symptoms: Secondary | ICD-10-CM

## 2019-02-25 DIAGNOSIS — R3989 Other symptoms and signs involving the genitourinary system: Secondary | ICD-10-CM

## 2019-02-25 DIAGNOSIS — N410 Acute prostatitis: Secondary | ICD-10-CM

## 2019-02-25 DIAGNOSIS — N138 Other obstructive and reflux uropathy: Secondary | ICD-10-CM | POA: Diagnosis not present

## 2019-02-25 DIAGNOSIS — Z87898 Personal history of other specified conditions: Secondary | ICD-10-CM | POA: Diagnosis not present

## 2019-02-25 LAB — URINALYSIS, COMPLETE
Bilirubin, UA: NEGATIVE
Glucose, UA: NEGATIVE
Ketones, UA: NEGATIVE
Leukocytes,UA: NEGATIVE
Nitrite, UA: POSITIVE — AB
Protein,UA: NEGATIVE
RBC, UA: NEGATIVE
Specific Gravity, UA: 1.02 (ref 1.005–1.030)
Urobilinogen, Ur: 0.2 mg/dL (ref 0.2–1.0)
pH, UA: 6 (ref 5.0–7.5)

## 2019-02-25 LAB — MICROSCOPIC EXAMINATION
Bacteria, UA: NONE SEEN
Epithelial Cells (non renal): NONE SEEN /hpf (ref 0–10)
RBC: NONE SEEN /hpf (ref 0–2)

## 2019-02-25 LAB — BLADDER SCAN AMB NON-IMAGING

## 2019-02-25 MED ORDER — CEFTRIAXONE SODIUM 1 G IJ SOLR
1.0000 g | Freq: Once | INTRAMUSCULAR | Status: AC
  Administered 2019-02-25: 1 g via INTRAMUSCULAR

## 2019-02-25 MED ORDER — AMOXICILLIN-POT CLAVULANATE 875-125 MG PO TABS: 1.0000 | ORAL_TABLET | Freq: Two times a day (BID) | ORAL | 0 refills | Status: DC

## 2019-02-25 NOTE — Progress Notes (Signed)
IM Injection  Patient is present today for an IM Injection for treatment of prostatitis Drug: Rocephin Dose:1GM Location:Right upper outer buttocks Lot: [id number] Exp:07/2019 Patient tolerated well, no complications were noted  Preformed by: Fonnie Jarvis , CMA

## 2019-02-25 NOTE — Progress Notes (Signed)
02/25/2019 4:29 PM   [name] 08/18/1956 [id number]  Referring provider: Juline Patch, MD [address] [place], [place] [address]  Chief Complaint  Patient presents with   Prostatitis    HPI: [name] is a 63 year old Caucasian male with a history of BPH with LU TS, urinary retention and prostatitis who presents today for symptoms of prostatitis.    He was seen in our office on 02/18/2019 for catheter removal after experiencing urinary retention after lumbar surgery on 02/10/2019.  He contacted our office on 02/21/2019 with symptoms of dysuria and frequency.  A prescription for Bactrim was sent in and he was instructed to contact us if his symptoms did not improve.    His symptoms did not improve and he presents today for further evaluation.  He is complaining of dysuria, perineal pain and urinating small amount every hour day and night.  He did even cath himself at home as he thought he might not be emptying his bladder and he did not have any residual.  He states he has not had a high fever or chills.  He denies any nausea or vomiting.    His UA today is clear and his PVR is 10 mL.    He was seen in [facility] ED and had a Foley placed with the return of 1L of urine on 02/10/2019.    He states prior to this he has not had issues with urinary retention.  He had seen Dr. Madelin Headings in the past for kidney stones and had two bouts of prostatitis last year which cleared with antibiotics.    His baseline urinary symptoms consist of nocturia x 1.    PMH: Past Medical History:  Diagnosis Date   Allergy    Arthritis    Hyperlipidemia    Hypertension    Hypokalemia    Joint pain     Surgical History: Past Surgical History:  Procedure Laterality Date   BACK SURGERY     JOINT REPLACEMENT      Home Medications:  Allergies as of 02/25/2019      Reactions   Mobic [meloxicam] Palpitations      Medication List       Accurate as of Feb 25, 2019  4:29  PM. Always use your most recent med list.        amoxicillin-clavulanate 875-125 MG tablet Commonly known as:  AUGMENTIN Take 1 tablet by mouth every 12 (twelve) hours.   aspirin 325 MG tablet Take 325 mg by mouth at bedtime.   celecoxib 200 MG capsule Commonly known as:  CELEBREX TAKE 1 CAPSULE BY MOUTH ONCE DAILY   cephALEXin 500 MG capsule Commonly known as:  KEFLEX Take 1 capsule (500 mg total) by mouth 3 (three) times daily.   diltiazem 120 MG 24 hr capsule Commonly known as:  Cardizem CD Take 1 capsule (120 mg total) by mouth daily.   doxycycline 100 MG capsule Commonly known as:  VIBRAMYCIN   fexofenadine 180 MG tablet Commonly known as:  ALLEGRA Take 1 tablet (180 mg total) by mouth daily.   fluorouracil 5 % cream Commonly known as:  EFUDEX APPLY TOPICALLY TO THE AFFECTED AREA EVERY WEEK   fluticasone 50 MCG/ACT nasal spray Commonly known as:  FLONASE INSTILL 1 SPRAY INTO EACH NOSTRIL ONCE DAILY   HYDROcodone-acetaminophen 5-325 MG tablet Commonly known as:  NORCO/VICODIN   lisinopril 20 MG tablet Commonly known as:  ZESTRIL Take 20 mg by mouth  daily. Dr Nehemiah Massed   methocarbamol 750 MG tablet Commonly known as:  ROBAXIN   methylPREDNISolone 4 MG Tbpk tablet Commonly known as:  [place]afael Capo See admin instructions.   montelukast 10 MG tablet Commonly known as:  SINGULAIR TAKE 1 TABLET BY MOUTH ONCE DAILY   multivitamin with minerals tablet Take 1 tablet by mouth daily.   pantoprazole 40 MG tablet Commonly known as:  PROTONIX Take 1 tablet by mouth once daily   potassium chloride SA 20 MEQ tablet Commonly known as:  K-DUR Take 3 tablets by mouth once daily   spironolactone 25 MG tablet Commonly known as:  ALDACTONE Take 25 mg by mouth daily.   sucralfate 1 g tablet Commonly known as:  Carafate Take 1 tablet (1 g total) by mouth 4 (four) times daily.   sulfamethoxazole-trimethoprim 800-160 MG tablet Commonly known as:  BACTRIM DS Take  1 tablet by mouth 2 (two) times daily for 7 days.   tamsulosin 0.4 MG Caps capsule Commonly known as:  FLOMAX Take 1 capsule (0.4 mg total) by mouth daily.       Allergies:  Allergies  Allergen Reactions   Mobic [Meloxicam] Palpitations    Family History: Family History  Problem Relation Age of Onset   Cancer Mother    Cancer Father    Cancer Brother    Hyperlipidemia Brother     Social History:  reports that he has never smoked. His smokeless tobacco use includes chew. He reports that he does not drink alcohol or use drugs.  ROS: UROLOGY Frequent Urination?: Yes Hard to postpone urination?: No Burning/pain with urination?: Yes Get up at night to urinate?: Yes Leakage of urine?: No Urine stream starts and stops?: No Trouble starting stream?: No Do you have to strain to urinate?: No Blood in urine?: No Urinary tract infection?: No Sexually transmitted disease?: No Injury to kidneys or bladder?: No Painful intercourse?: No Weak stream?: No Erection problems?: No Penile pain?: No  Gastrointestinal Nausea?: Yes Vomiting?: No Indigestion/heartburn?: No Diarrhea?: No Constipation?: No  Constitutional Fever: No Night sweats?: Yes Weight loss?: No Fatigue?: No  Skin Skin rash/lesions?: No Itching?: No  Eyes Blurred vision?: No Double vision?: No  Ears/Nose/Throat Sore throat?: No Sinus problems?: No  Hematologic/Lymphatic Swollen glands?: No Easy bruising?: No  Cardiovascular Leg swelling?: No Chest pain?: No  Respiratory Cough?: No Shortness of breath?: No  Endocrine Excessive thirst?: No  Musculoskeletal Back pain?: No Joint pain?: No  Neurological Headaches?: No Dizziness?: No  Psychologic Depression?: No Anxiety?: No  Physical Exam: BP 104/73    Pulse (!) 106    Ht 6\' 1"  (1.854 m)    Wt 225 lb (102.1 kg)    BMI 29.69 kg/m   Constitutional:  Well nourished. Alert and oriented, No acute distress. HEENT: [place] AT, moist  mucus membranes.  Trachea midline, no masses. Cardiovascular: No clubbing, cyanosis, or edema. Respiratory: Normal respiratory effort, no increased work of breathing. GI: Abdomen is soft, non tender, non distended, no abdominal masses. Liver and spleen not palpable.  No hernias appreciated.  Stool sample for occult testing is not indicated.   GU: No CVA tenderness.  No bladder fullness or masses.  Patient with circumcised phallus.  Urethral meatus is patent.  No penile discharge. No penile lesions or rashes. Scrotum without lesions, cysts, rashes and/or edema.  Testicles are located scrotally bilaterally. No masses are appreciated in the testicles. Left and right epididymis are normal. Rectal: Patient with  normal sphincter tone. Anus and  perineum without scarring or rashes. No rectal masses are appreciated. Prostate is approximately 55 grams, tender, right lobe firmer than the left lobe, no nodules are appreciated. Seminal vesicles are normal. Skin: No rashes, bruises or suspicious lesions. Lymph: No cervical or inguinal adenopathy. Neurologic: Grossly intact, no focal deficits, moving all 4 extremities. Psychiatric: Normal mood and affect.  Laboratory Data: PSA 1.5 in 11/2017 Lab Results  Component Value Date   WBC 5.3 12/03/2018   HGB 15.2 12/03/2018   HCT 43.9 12/03/2018   MCV 83 12/03/2018   PLT 165 12/03/2018    Lab Results  Component Value Date   CREATININE 1.10 12/18/2018    No results found for: PSA  No results found for: TESTOSTERONE  No results found for: HGBA1C  No results found for: TSH     Component Value Date/Time   CHOL 218 (H) 12/14/2017 1110   HDL 50 12/14/2017 1110   LDLCALC 143 (H) 12/14/2017 1110    Lab Results  Component Value Date   AST 27 01/28/2017   Lab Results  Component Value Date   ALT 29 01/28/2017   No components found for: ALKALINEPHOPHATASE No components found for: BILIRUBINTOTAL  No results found for: ESTRADIOL  Urinalysis      Component Value Date/Time   COLORURINE YELLOW 07/28/2018 [place] 07/28/2018 1319   LABSPEC 1.015 07/28/2018 1319   PHURINE 6.0 07/28/2018 1319   GLUCOSEU NEGATIVE 07/28/2018 1319   HGBUR TRACE (A) 07/28/2018 1319   BILIRUBINUR NEG 08/01/2018 [place] 07/28/2018 1319   PROTEINUR Negative 08/01/2018 [place] 07/28/2018 1319   UROBILINOGEN 0.2 08/01/2018 1218   NITRITE NEG 08/01/2018 1218   NITRITE NEGATIVE 07/28/2018 1319   LEUKOCYTESUR Trace (A) 08/01/2018 1218    I have reviewed the labs.   Pertinent Imaging: Results for WLADYSLAW, HENRICHS (MRN [id number]) as of 02/25/2019 16:24  Ref. Range 02/25/2019 15:10  Scan Result Unknown 70ml    Assessment & Plan:    1. Prostatitis - given 1 gram of Rocephin in the office - script sent for Augmentin 875/125, # 60 BID to pharmacy - instructed to call by Wednesday if not improved Patient is advised that if they should start to experience pain that is not able to be controlled with pain medication, intractable nausea and/or vomiting and/or fevers greater than 103 or shaking chills to contact the office immediately or seek treatment in the emergency department for emergent intervention.    2. Acute urinary retention:    - resolved   3. Abnormal prostate exam - will RTC in one month for repeat exam and PSA   3. BPH with LUTS - Continue conservative management, avoiding bladder irritants and timed voiding's - Most bothersome symptoms is/are urinary retention - Continue alpha-blocker (tamsulosin 0.4 mg daily) - RTC in one month for IPSS, PSA, PVR and exam   Return for patient to check in on Wednesday .  These notes generated with voice recognition software. I apologize for typographical errors.  Zara Council, PA-C  [facility] [address]  [place], [place] [address] [phone]

## 2019-02-28 ENCOUNTER — Telehealth: Payer: Self-pay | Admitting: Urology

## 2019-02-28 NOTE — Telephone Encounter (Signed)
Please have him continue the Augmentin and have him follow up in the office with me on Monday.  If he should start to experience fevers/chills or worsening of his symptoms, he should be seen in the ED.

## 2019-02-28 NOTE — Telephone Encounter (Signed)
Patient was seen in the office on Monday and told to call the office today with an update:  Patient reports that he is feeling a little better.  He is still experiencing frequent urination, no fever, and still feels like he is sitting on a golf ball.  Please advise.

## 2019-03-09 ENCOUNTER — Other Ambulatory Visit: Payer: Self-pay | Admitting: Family Medicine

## 2019-03-09 DIAGNOSIS — J302 Other seasonal allergic rhinitis: Secondary | ICD-10-CM

## 2019-03-14 ENCOUNTER — Other Ambulatory Visit: Payer: Self-pay

## 2019-03-14 DIAGNOSIS — N138 Other obstructive and reflux uropathy: Secondary | ICD-10-CM

## 2019-03-14 DIAGNOSIS — N401 Enlarged prostate with lower urinary tract symptoms: Secondary | ICD-10-CM

## 2019-03-19 ENCOUNTER — Other Ambulatory Visit: Payer: Medicare Other

## 2019-03-19 ENCOUNTER — Other Ambulatory Visit: Payer: Self-pay

## 2019-03-19 DIAGNOSIS — N138 Other obstructive and reflux uropathy: Secondary | ICD-10-CM

## 2019-03-19 DIAGNOSIS — N401 Enlarged prostate with lower urinary tract symptoms: Secondary | ICD-10-CM

## 2019-03-20 ENCOUNTER — Ambulatory Visit: Payer: BC Managed Care – PPO | Admitting: Urology

## 2019-03-20 LAB — PSA: Prostate Specific Ag, Serum: 11.2 ng/mL — ABNORMAL HIGH (ref 0.0–4.0)

## 2019-03-24 ENCOUNTER — Other Ambulatory Visit: Payer: Self-pay | Admitting: Family Medicine

## 2019-03-24 DIAGNOSIS — K219 Gastro-esophageal reflux disease without esophagitis: Secondary | ICD-10-CM

## 2019-03-24 DIAGNOSIS — J302 Other seasonal allergic rhinitis: Secondary | ICD-10-CM

## 2019-03-27 NOTE — Progress Notes (Signed)
03/28/2019 4:32 PM   [name] September 29, 1956 [id number]  Referring provider: Juline Patch, MD [address] [place], [place] [address]  Chief Complaint  Patient presents with  . Benign Prostatic Hypertrophy    HPI: [name] is a 63 year old Caucasian male with a history of BPH with LU TS, urinary retention and prostatitis who presents today for follow up.    He was seen in our office on 02/18/2019 for catheter removal after experiencing urinary retention after lumbar surgery on 02/10/2019.  He presented to [facility] ED and had a Foley placed with the return of 1L of urine on 02/10/2019.    He contacted our office on 02/21/2019 with symptoms of dysuria and frequency.  A prescription for Bactrim was sent in and he was instructed to contact us if his symptoms did not improve.    His symptoms did not improve and he presents today for further evaluation.  He is complaining of dysuria, perineal pain and urinating small amount every hour day and night.  He did even cath himself at home as he thought he might not be emptying his bladder and he did not have any residual.  He states he has not had a high fever or chills.  He denies any nausea or vomiting.    On 02/25/2019, his symptoms did not improve.  He was given 1 gram of Rocephin in the office and given a script for Augmentin.     He states prior to this he has not had issues with urinary retention.  He had seen Dr. Madelin Headings in the past for kidney stones and had two bouts of prostatitis last year which cleared with antibiotics.    His baseline urinary symptoms consist of nocturia x 1.    BPH WITH LUTS  (prostate and/or bladder) IPSS score: 16/4       PVR: 32 mL    Major complaint(s):  Frequency, urgency and nocturia, since Foley was placed on 02/10/2019.  Denies any dysuria, hematuria or suprapubic pain.   Currently taking: tamsulosin 0.4 mg   Denies any recent fevers, chills, nausea or vomiting.  IPSS    Row Name  03/28/19 1000         International Prostate Symptom Score   How often have you had the sensation of not emptying your bladder?  Not at All     How often have you had to urinate less than every two hours?  More than half the time     How often have you found you stopped and started again several times when you urinated?  About half the time     How often have you found it difficult to postpone urination?  Less than half the time     How often have you had a weak urinary stream?  Less than half the time     How often have you had to strain to start urination?  Not at All     How many times did you typically get up at night to urinate?  4 Times     Total IPSS Score  15       Quality of Life due to urinary symptoms   If you were to spend the rest of your life with your urinary condition just the way it is now how would you feel about that?  Mostly Disatisfied        Score:  1-7 Mild 8-19 Moderate 20-35 Severe  PMH: Past Medical History:  Diagnosis Date  . Allergy   . Arthritis   . Hyperlipidemia   . Hypertension   . Hypokalemia   . Joint pain     Surgical History: Past Surgical History:  Procedure Laterality Date  . BACK SURGERY    . JOINT REPLACEMENT      Home Medications:  Allergies as of 03/28/2019      Reactions   Mobic [meloxicam] Palpitations      Medication List       Accurate as of March 28, 2019 11:59 PM. If you have any questions, ask your nurse or doctor.        STOP taking these medications   cephALEXin 500 MG capsule Commonly known as:  KEFLEX Stopped by:  Roxsana Riding, PA-C   HYDROcodone-acetaminophen 5-325 MG tablet Commonly known as:  NORCO/VICODIN Stopped by:  Katelynn Heidler, PA-C   methylPREDNISolone 4 MG Tbpk tablet Commonly known as:  MEDROL DOSEPAK Stopped by:  Zara Council, PA-C     TAKE these medications   amoxicillin-clavulanate 875-125 MG tablet Commonly known as:  AUGMENTIN Take 1 tablet by mouth every 12 (twelve)  hours.   aspirin 325 MG tablet Take 325 mg by mouth at bedtime.   celecoxib 200 MG capsule Commonly known as:  CELEBREX TAKE 1 CAPSULE BY MOUTH ONCE DAILY   diltiazem 120 MG 24 hr capsule Commonly known as:  Cardizem CD Take 1 capsule (120 mg total) by mouth daily. What changed:    when to take this  additional instructions   doxycycline 100 MG capsule Commonly known as:  VIBRAMYCIN   fexofenadine 180 MG tablet Commonly known as:  ALLEGRA Take 1 tablet (180 mg total) by mouth daily.   fluorouracil 5 % cream Commonly known as:  EFUDEX APPLY TOPICALLY TO THE AFFECTED AREA EVERY WEEK   fluticasone 50 MCG/ACT nasal spray Commonly known as:  FLONASE Use 1 spray(s) in each nostril once daily   lisinopril 20 MG tablet Commonly known as:  ZESTRIL Take 20 mg by mouth daily. Dr Nehemiah Massed   methocarbamol 750 MG tablet Commonly known as:  ROBAXIN   montelukast 10 MG tablet Commonly known as:  SINGULAIR Take 1 tablet by mouth once daily   multivitamin with minerals tablet Take 1 tablet by mouth daily.   pantoprazole 40 MG tablet Commonly known as:  PROTONIX Take 1 tablet by mouth once daily   potassium chloride SA 20 MEQ tablet Commonly known as:  K-DUR Take 3 tablets by mouth once daily   spironolactone 25 MG tablet Commonly known as:  ALDACTONE Take 25 mg by mouth daily.   sucralfate 1 g tablet Commonly known as:  Carafate Take 1 tablet (1 g total) by mouth 4 (four) times daily.   tamsulosin 0.4 MG Caps capsule Commonly known as:  FLOMAX Take 1 capsule (0.4 mg total) by mouth daily.       Allergies:  Allergies  Allergen Reactions  . Mobic [Meloxicam] Palpitations    Family History: Family History  Problem Relation Age of Onset  . Cancer Mother   . Cancer Father   . Cancer Brother   . Hyperlipidemia Brother     Social History:  reports that he has never smoked. His smokeless tobacco use includes chew. He reports that he does not drink alcohol or  use drugs.  ROS: UROLOGY Frequent Urination?: Yes Hard to postpone urination?: Yes Burning/pain with urination?: No Get up at night to urinate?: Yes Leakage of urine?:  No Urine stream starts and stops?: No Trouble starting stream?: No Do you have to strain to urinate?: No Blood in urine?: No Urinary tract infection?: Yes Sexually transmitted disease?: No Injury to kidneys or bladder?: No Painful intercourse?: No Weak stream?: No Erection problems?: No Penile pain?: No  Gastrointestinal Nausea?: No Vomiting?: No Indigestion/heartburn?: No Diarrhea?: No Constipation?: No  Constitutional Fever: No Night sweats?: No Weight loss?: No Fatigue?: No  Skin Skin rash/lesions?: No Itching?: No  Eyes Blurred vision?: No Double vision?: No  Ears/Nose/Throat Sore throat?: No Sinus problems?: No  Hematologic/Lymphatic Swollen glands?: No Easy bruising?: No  Cardiovascular Leg swelling?: No Chest pain?: No  Respiratory Cough?: No Shortness of breath?: No  Endocrine Excessive thirst?: No  Musculoskeletal Back pain?: No Joint pain?: No  Neurological Headaches?: No Dizziness?: No  Psychologic Depression?: No Anxiety?: No  Physical Exam: BP 118/77 (BP Location: Left Arm, Patient Position: Sitting, Cuff Size: Normal)   Pulse (!) 106   Ht 6\' 1"  (1.854 m)   Wt 233 lb (105.7 kg)   BMI 30.74 kg/m   Constitutional:  Well nourished. Alert and oriented, No acute distress. HEENT: [place] AT, moist mucus membranes.  Trachea midline, no masses. Cardiovascular: No clubbing, cyanosis, or edema. Respiratory: Normal respiratory effort, no increased work of breathing. GI: Abdomen is soft, non tender, non distended, no abdominal masses. Liver and spleen not palpable.  No hernias appreciated.  Stool sample for occult testing is not indicated.   GU: No CVA tenderness.  No bladder fullness or masses.  Patient with circumcised phallus. Urethral meatus is patent.  No penile  discharge. No penile lesions or rashes. Scrotum without lesions, cysts, rashes and/or edema.  Testicles are located scrotally bilaterally. No masses are appreciated in the testicles. Left and right epididymis are normal. Rectal: Patient with  normal sphincter tone. Anus and perineum without scarring or rashes. No rectal masses are appreciated. Prostate is approximately 50 grams, right lobe very firm and indurated, no nodules are appreciated. Skin: No rashes, bruises or suspicious lesions. Lymph: No inguinal adenopathy. Neurologic: Grossly intact, no focal deficits, moving all 4 extremities. Psychiatric: Normal mood and affect.  Laboratory Data: PSA 1.5 in 11/2017 PSA 11.2 in 02/2019 Lab Results  Component Value Date   WBC 5.3 12/03/2018   HGB 15.2 12/03/2018   HCT 43.9 12/03/2018   MCV 83 12/03/2018   PLT 165 12/03/2018    Lab Results  Component Value Date   CREATININE 1.10 12/18/2018    No results found for: PSA  No results found for: TESTOSTERONE  No results found for: HGBA1C  No results found for: TSH     Component Value Date/Time   CHOL 218 (H) 12/14/2017 1110   HDL 50 12/14/2017 1110   LDLCALC 143 (H) 12/14/2017 1110    Lab Results  Component Value Date   AST 27 01/28/2017   Lab Results  Component Value Date   ALT 29 01/28/2017   No components found for: ALKALINEPHOPHATASE No components found for: BILIRUBINTOTAL  No results found for: ESTRADIOL  Urinalysis 6-10 WBC's and 0-5 RBC's. See Epic.   I have reviewed the labs.   Pertinent Imaging: Results for BOY, DELAMATER (MRN [id number]) as of 03/28/2019 13:15  Ref. Range 03/28/2019 13:15  Scan Result Unknown 32 mL     Assessment & Plan:    1. Prostatitis Will continue Augmentin for another 30 days Patient is advised that if they should start to experience pain that is not able to be  controlled with pain medication, intractable nausea and/or vomiting and/or fevers greater than 103 or shaking chills  to contact the office immediately or seek treatment in the emergency department for emergent intervention.    2. Acute urinary retention:    - resolved   3. Elevated PSA Likely the result of prostatitis Will have patient RTC in one month for repeat PSA   4. Abnormal prostate exam - will RTC in one month for repeat exam and PSA   5. BPH with LU TS I PSS 15/4 - Continue conservative management, avoiding bladder irritants and timed voiding's - Continue tamsulosin 0.4 mg daily - RTC in one month for IPSS, PSA, PVR and exam   Return in about 1 month (around 04/27/2019) for PSA and exam .  These notes generated with voice recognition software. I apologize for typographical errors.  Zara Council, PA-C  [facility] [address]  [place], [place] [address] [phone]

## 2019-03-28 ENCOUNTER — Encounter: Payer: Self-pay | Admitting: Urology

## 2019-03-28 ENCOUNTER — Other Ambulatory Visit: Payer: Self-pay

## 2019-03-28 ENCOUNTER — Other Ambulatory Visit
Admission: RE | Admit: 2019-03-28 | Discharge: 2019-03-28 | Disposition: A | Discharge status: Home / Self Care | Payer: Medicare Other | Attending: Urology | Admitting: Urology

## 2019-03-28 ENCOUNTER — Ambulatory Visit (INDEPENDENT_AMBULATORY_CARE_PROVIDER_SITE_OTHER): Payer: Medicare Other | Admitting: Urology

## 2019-03-28 VITALS — BP 118/77 | HR 106 | Ht 73.0 in | Wt 233.0 lb

## 2019-03-28 DIAGNOSIS — Z87898 Personal history of other specified conditions: Secondary | ICD-10-CM

## 2019-03-28 DIAGNOSIS — N410 Acute prostatitis: Secondary | ICD-10-CM | POA: Insufficient documentation

## 2019-03-28 DIAGNOSIS — R972 Elevated prostate specific antigen [PSA]: Secondary | ICD-10-CM

## 2019-03-28 DIAGNOSIS — R3989 Other symptoms and signs involving the genitourinary system: Secondary | ICD-10-CM | POA: Diagnosis not present

## 2019-03-28 DIAGNOSIS — N401 Enlarged prostate with lower urinary tract symptoms: Secondary | ICD-10-CM

## 2019-03-28 DIAGNOSIS — N138 Other obstructive and reflux uropathy: Secondary | ICD-10-CM

## 2019-03-28 LAB — URINALYSIS, COMPLETE (UACMP) WITH MICROSCOPIC
Bacteria, UA: NONE SEEN
Bilirubin Urine: NEGATIVE
Glucose, UA: NEGATIVE mg/dL
Ketones, ur: NEGATIVE mg/dL
Leukocytes,Ua: NEGATIVE
Nitrite: NEGATIVE
Protein, ur: NEGATIVE mg/dL
Specific Gravity, Urine: 1.01 (ref 1.005–1.030)
Squamous Epithelial / LPF: NONE SEEN (ref 0–5)
pH: 5.5 (ref 5.0–8.0)

## 2019-03-28 LAB — BLADDER SCAN AMB NON-IMAGING: Scan Result: 32

## 2019-03-29 MED ORDER — AMOXICILLIN-POT CLAVULANATE 875-125 MG PO TABS: 1.0000 | ORAL_TABLET | Freq: Two times a day (BID) | ORAL | 0 refills | Status: DC

## 2019-03-30 LAB — URINE CULTURE: Culture: NO GROWTH

## 2019-04-02 ENCOUNTER — Other Ambulatory Visit: Payer: Self-pay | Admitting: Family Medicine

## 2019-04-02 DIAGNOSIS — E876 Hypokalemia: Secondary | ICD-10-CM

## 2019-04-05 ENCOUNTER — Ambulatory Visit: Payer: BC Managed Care – PPO | Admitting: Urology

## 2019-04-08 ENCOUNTER — Ambulatory Visit: Payer: BC Managed Care – PPO | Admitting: Urology

## 2019-04-14 ENCOUNTER — Other Ambulatory Visit: Payer: Self-pay | Admitting: Family Medicine

## 2019-04-14 DIAGNOSIS — M5136 Other intervertebral disc degeneration, lumbar region: Secondary | ICD-10-CM

## 2019-04-14 DIAGNOSIS — J302 Other seasonal allergic rhinitis: Secondary | ICD-10-CM

## 2019-04-14 DIAGNOSIS — M1712 Unilateral primary osteoarthritis, left knee: Secondary | ICD-10-CM

## 2019-04-17 ENCOUNTER — Other Ambulatory Visit: Payer: Self-pay

## 2019-04-17 ENCOUNTER — Ambulatory Visit: Payer: Medicare Other | Admitting: Family Medicine

## 2019-04-17 ENCOUNTER — Encounter: Payer: Self-pay | Admitting: Family Medicine

## 2019-04-17 VITALS — BP 120/70 | HR 100 | Ht 73.0 in | Wt 238.0 lb

## 2019-04-17 DIAGNOSIS — L739 Follicular disorder, unspecified: Secondary | ICD-10-CM | POA: Diagnosis not present

## 2019-04-17 DIAGNOSIS — B349 Viral infection, unspecified: Secondary | ICD-10-CM | POA: Diagnosis not present

## 2019-04-17 MED ORDER — MUPIROCIN 2 % EX OINT: 1.0000 "application " | TOPICAL_OINTMENT | Freq: Two times a day (BID) | CUTANEOUS | 0 refills | Status: DC

## 2019-04-17 MED ORDER — VALACYCLOVIR HCL 1 G PO TABS: 1000.0000 mg | ORAL_TABLET | Freq: Three times a day (TID) | ORAL | 0 refills | Status: DC

## 2019-04-17 NOTE — Progress Notes (Signed)
Date:  04/17/2019   Name:  [name]   DOB:  10/09/1956   MRN:  [id number]   Chief Complaint: Herpes Zoster (place on R) side of back that is red and sore- is currently on a pred taper started yesterday)  Rash This is a new problem. The current episode started yesterday. The problem has been waxing and waning since onset. The affected locations include the torso. The rash is characterized by redness. Associated with: prednisone. Pertinent negatives include no anorexia, congestion, cough, diarrhea, eye pain, facial edema, fatigue, fever, joint pain, nail changes, rhinorrhea, shortness of breath, sore throat or vomiting.    Review of Systems  Constitutional: Negative for chills, fatigue and fever.  HENT: Negative for congestion, drooling, ear discharge, ear pain, rhinorrhea and sore throat.   Eyes: Negative for pain.  Respiratory: Negative for cough, shortness of breath and wheezing.   Cardiovascular: Negative for chest pain, palpitations and leg swelling.  Gastrointestinal: Negative for abdominal pain, anorexia, blood in stool, constipation, diarrhea, nausea and vomiting.  Endocrine: Negative for polydipsia.  Genitourinary: Negative for dysuria, frequency, hematuria and urgency.  Musculoskeletal: Negative for back pain, joint pain, myalgias and neck pain.  Skin: Positive for rash. Negative for nail changes.  Allergic/Immunologic: Negative for environmental allergies.  Neurological: Negative for dizziness and headaches.  Hematological: Does not bruise/bleed easily.  Psychiatric/Behavioral: Negative for suicidal ideas. The patient is not nervous/anxious.     Patient Active Problem List   Diagnosis Date Noted  . Biceps tendonitis on right 07/27/2018  . Primary osteoarthritis of right knee 10/12/2017  . Taking multiple medications for chronic disease 07/30/2015  . Bronchitis 07/01/2015  . Reactive airway disease 07/01/2015  . Allergic sinusitis 07/01/2015  . Essential  hypertension 07/01/2015  . Valvular heart disease 07/01/2015  . Metatarsalgia of left foot 06/10/2015  . Roundup arthritis 12/15/2013    Allergies  Allergen Reactions  . Mobic [Meloxicam] Palpitations    Past Surgical History:  Procedure Laterality Date  . BACK SURGERY    . JOINT REPLACEMENT      Social History   Tobacco Use  . Smoking status: Never Smoker  . Smokeless tobacco: Current User    Types: Chew  Substance Use Topics  . Alcohol use: No  . Drug use: Never     Medication list has been reviewed and updated.  Current Meds  Medication Sig  . aspirin 325 MG tablet Take 325 mg by mouth at bedtime.   . celecoxib (CELEBREX) 200 MG capsule Take 1 capsule by mouth once daily  . diltiazem (CARDIZEM CD) 120 MG 24 hr capsule Take 1 capsule (120 mg total) by mouth daily. (Patient taking differently: Take 120 mg by mouth 2 (two) times daily. Dr Nehemiah Massed)  . fexofenadine (ALLEGRA) 180 MG tablet Take 1 tablet (180 mg total) by mouth daily.  . fluorouracil (EFUDEX) 5 % cream APPLY TOPICALLY TO THE AFFECTED AREA EVERY WEEK  . fluticasone (FLONASE) 50 MCG/ACT nasal spray Use 1 spray(s) in each nostril once daily  . lisinopril (PRINIVIL,ZESTRIL) 20 MG tablet Take 20 mg by mouth daily. Dr Nehemiah Massed  . methocarbamol (ROBAXIN) 750 MG tablet   . montelukast (SINGULAIR) 10 MG tablet Take 1 tablet by mouth once daily  . Multiple Vitamins-Minerals (MULTIVITAMIN WITH MINERALS) tablet Take 1 tablet by mouth daily.  . pantoprazole (PROTONIX) 40 MG tablet Take 1 tablet by mouth once daily  . potassium chloride SA (K-DUR) 20 MEQ tablet Take 3 tablets by mouth once daily  .  spironolactone (ALDACTONE) 25 MG tablet Take 25 mg by mouth daily.  . sucralfate (CARAFATE) 1 g tablet Take 1 tablet (1 g total) by mouth 4 (four) times daily.  . tamsulosin (FLOMAX) 0.4 MG CAPS capsule Take 1 capsule (0.4 mg total) by mouth daily.  . [DISCONTINUED] amoxicillin-clavulanate (AUGMENTIN) 875-125 MG tablet Take 1  tablet by mouth every 12 (twelve) hours.    PHQ 2/9 Scores 12/14/2017 04/08/2016 02/11/2016 06/19/2015  PHQ - 2 Score 0 0 0 0  PHQ- 9 Score 0 - - -    BP Readings from Last 3 Encounters:  04/17/19 120/70  03/28/19 118/77  02/25/19 104/73    Physical Exam Vitals signs and nursing note reviewed.  HENT:     Head: Normocephalic.     Right Ear: External ear normal.     Left Ear: External ear normal.     Nose: Nose normal.  Eyes:     General: No scleral icterus.       Right eye: No discharge.        Left eye: No discharge.     Conjunctiva/sclera: Conjunctivae normal.     Pupils: Pupils are equal, round, and reactive to light.  Neck:     Musculoskeletal: Normal range of motion and neck supple.     Thyroid: No thyromegaly.     Vascular: No JVD.     Trachea: No tracheal deviation.  Cardiovascular:     Rate and Rhythm: Normal rate and regular rhythm.     Heart sounds: Normal heart sounds. No murmur. No friction rub. No gallop.   Pulmonary:     Effort: No respiratory distress.     Breath sounds: Normal breath sounds. No wheezing or rales.  Abdominal:     General: Bowel sounds are normal.     Palpations: Abdomen is soft. There is no mass.     Tenderness: There is no abdominal tenderness. There is no guarding or rebound.  Musculoskeletal: Normal range of motion.        General: No tenderness.  Lymphadenopathy:     Cervical: No cervical adenopathy.  Skin:    General: Skin is warm and dry.     Coloration: Skin is not jaundiced or pale.     Findings: Erythema present. No bruising, lesion or rash.  Neurological:     Mental Status: He is alert and oriented to person, place, and time.     Cranial Nerves: No cranial nerve deficit.     Deep Tendon Reflexes: Reflexes are normal and symmetric.     Wt Readings from Last 3 Encounters:  04/17/19 238 lb (108 kg)  03/28/19 233 lb (105.7 kg)  02/25/19 225 lb (102.1 kg)    BP 120/70   Pulse 100   Ht 6\' 1"  (1.854 m)   Wt 238 lb (108  kg)   BMI 31.40 kg/m   Assessment and Plan: 1. Viral syndrome Acute.  New onset.  Patient has early evidence of possible zoster.  Will initiate Valtrex 1 g 3 times a day for 7 days. - valACYclovir (VALTREX) 1000 MG tablet; Take 1 tablet (1,000 mg total) by mouth 3 (three) times daily.  Dispense: 21 tablet; Refill: 0  2. Folliculitis There is an area that is suggestive of an early folliculitis and we will treat this with Bactroban ointment twice a day. - mupirocin ointment (BACTROBAN) 2 %; Apply 1 application topically 2 (two) times daily.  Dispense: 22 g; Refill: 0

## 2019-04-18 ENCOUNTER — Ambulatory Visit: Payer: Medicare Other | Admitting: Family Medicine

## 2019-04-23 ENCOUNTER — Other Ambulatory Visit: Payer: Self-pay | Admitting: Family Medicine

## 2019-04-23 DIAGNOSIS — K219 Gastro-esophageal reflux disease without esophagitis: Secondary | ICD-10-CM

## 2019-04-24 ENCOUNTER — Other Ambulatory Visit: Payer: Self-pay

## 2019-04-24 DIAGNOSIS — R972 Elevated prostate specific antigen [PSA]: Secondary | ICD-10-CM

## 2019-04-25 ENCOUNTER — Other Ambulatory Visit: Payer: Medicare Other

## 2019-04-25 ENCOUNTER — Other Ambulatory Visit: Payer: Self-pay

## 2019-04-25 DIAGNOSIS — R972 Elevated prostate specific antigen [PSA]: Secondary | ICD-10-CM

## 2019-04-26 LAB — PSA: Prostate Specific Ag, Serum: 10.4 ng/mL — ABNORMAL HIGH (ref 0.0–4.0)

## 2019-05-01 NOTE — Progress Notes (Signed)
05/02/2019 9:12 AM   [name] Apr 18, 1956 [id number]  Referring provider: Juline Patch, MD [address] [place],  [place] [address]  Chief Complaint  Patient presents with  . Benign Prostatic Hypertrophy    HPI: Mr. Goynes is a 63 year old Caucasian male with a history of BPH with LU TS, urinary retention, prostatitis and an elevated PSA who presents today for follow up.    He was seen in our office on 02/18/2019 for catheter removal after experiencing urinary retention after lumbar surgery on 02/10/2019.  He presented to [facility] ED and had a Foley placed with the return of 1L of urine on 02/10/2019.    He contacted our office on 02/21/2019 with symptoms of dysuria and frequency.  A prescription for Bactrim was sent in and he was instructed to contact us if his symptoms did not improve.    His symptoms did not improve and he presents today for further evaluation.  He is complaining of dysuria, perineal pain and urinating small amount every hour day and night.  He did even cath himself at home as he thought he might not be emptying his bladder and he did not have any residual.  He states he has not had a high fever or chills.  He denies any nausea or vomiting.    On 02/25/2019, his symptoms did not improve.  He was given 1 gram of Rocephin in the office and given a script for Augmentin.     He states prior to this he has not had issues with urinary retention.  He had seen Dr. Madelin Headings in the past for kidney stones and had two bouts of prostatitis last year which cleared with antibiotics.    His baseline urinary symptoms consist of nocturia x 1.    Today, he states he is having a burning with urination.  He describes it as one spot inside his urethra that gets hot when urine hits it.  He had finished his antibiotic about one week ago.  Patient denies any gross hematuria or suprapubic/flank pain.  Patient denies any fevers, chills, nausea or vomiting.   BPH WITH LUTS   (prostate and/or bladder) I PSS score:   6/2     PVR: 7 mL            Previous  IPSS score: 16/4       Previous PVR: 32 mL      Major complaint(s):  Frequency, dysuria and nocturia, since Foley was placed on 02/10/2019.  Denies any  hematuria or suprapubic pain.   Currently taking: tamsulosin 0.4 mg    Denies any recent fevers, chills, nausea or vomiting. IPSS    Row Name 05/02/19 0800         International Prostate Symptom Score   How often have you had the sensation of not emptying your bladder?  Less than 1 in 5     How often have you had to urinate less than every two hours?  Less than half the time     How often have you found you stopped and started again several times when you urinated?  Not at All     How often have you found it difficult to postpone urination?  Not at All     How often have you had a weak urinary stream?  Not at All     How often have you had to strain to start urination?  Not at All  How many times did you typically get up at night to urinate?  3 Times     Total IPSS Score  6       Quality of Life due to urinary symptoms   If you were to spend the rest of your life with your urinary condition just the way it is now how would you feel about that?  Mostly Satisfied        Score:  1-7 Mild 8-19 Moderate 20-35 Severe   PMH: Past Medical History:  Diagnosis Date  . Allergy   . Arthritis   . Hyperlipidemia   . Hypertension   . Hypokalemia   . Joint pain     Surgical History: Past Surgical History:  Procedure Laterality Date  . BACK SURGERY    . JOINT REPLACEMENT      Home Medications:  Allergies as of 05/02/2019      Reactions   Mobic [meloxicam] Palpitations      Medication List       Accurate as of May 02, 2019  9:12 AM. If you have any questions, ask your nurse or doctor.        STOP taking these medications   mupirocin ointment 2 % Commonly known as: Bactroban Stopped by: Zara Council, PA-C   valACYclovir 1000 MG tablet  Commonly known as: VALTREX Stopped by: Zara Council, PA-C     TAKE these medications   aspirin 325 MG tablet Take 325 mg by mouth at bedtime.   celecoxib 200 MG capsule Commonly known as: CELEBREX Take 1 capsule by mouth once daily   ciprofloxacin 500 MG tablet Commonly known as: CIPRO Take 1 tablet (500 mg total) by mouth every 12 (twelve) hours. Started by: Zara Council, PA-C   diltiazem 120 MG 24 hr capsule Commonly known as: Cardizem CD Take 1 capsule (120 mg total) by mouth daily. What changed:   when to take this  additional instructions   fexofenadine 180 MG tablet Commonly known as: ALLEGRA Take 1 tablet (180 mg total) by mouth daily.   fluorouracil 5 % cream Commonly known as: EFUDEX APPLY TOPICALLY TO THE AFFECTED AREA EVERY WEEK   fluticasone 50 MCG/ACT nasal spray Commonly known as: FLONASE Use 1 spray(s) in each nostril once daily   lisinopril 20 MG tablet Commonly known as: ZESTRIL Take 20 mg by mouth daily. Dr Nehemiah Massed   methocarbamol 750 MG tablet Commonly known as: ROBAXIN   montelukast 10 MG tablet Commonly known as: SINGULAIR Take 1 tablet by mouth once daily   multivitamin with minerals tablet Take 1 tablet by mouth daily.   pantoprazole 40 MG tablet Commonly known as: PROTONIX Take 1 tablet by mouth once daily   potassium chloride SA 20 MEQ tablet Commonly known as: K-DUR Take 3 tablets by mouth once daily   spironolactone 25 MG tablet Commonly known as: ALDACTONE Take 25 mg by mouth daily.   sucralfate 1 g tablet Commonly known as: Carafate Take 1 tablet (1 g total) by mouth 4 (four) times daily.   tamsulosin 0.4 MG Caps capsule Commonly known as: FLOMAX Take 1 capsule (0.4 mg total) by mouth daily.       Allergies:  Allergies  Allergen Reactions  . Mobic [Meloxicam] Palpitations    Family History: Family History  Problem Relation Age of Onset  . Cancer Mother   . Cancer Father   . Cancer Brother   .  Hyperlipidemia Brother     Social History:  reports that he  has never smoked. His smokeless tobacco use includes chew. He reports that he does not drink alcohol or use drugs.  ROS: UROLOGY Frequent Urination?: Yes Hard to postpone urination?: No Burning/pain with urination?: Yes Get up at night to urinate?: Yes Leakage of urine?: No Urine stream starts and stops?: No Trouble starting stream?: No Do you have to strain to urinate?: No Blood in urine?: No Urinary tract infection?: No Sexually transmitted disease?: No Injury to kidneys or bladder?: No Painful intercourse?: No Weak stream?: No Erection problems?: No Penile pain?: No  Gastrointestinal Nausea?: No Vomiting?: No Indigestion/heartburn?: No Diarrhea?: No Constipation?: No  Constitutional Fever: No Night sweats?: No Weight loss?: No Fatigue?: No  Skin Skin rash/lesions?: No Itching?: No  Eyes Blurred vision?: No Double vision?: No  Ears/Nose/Throat Sore throat?: No Sinus problems?: No  Hematologic/Lymphatic Swollen glands?: No Easy bruising?: No  Cardiovascular Leg swelling?: No Chest pain?: No  Respiratory Cough?: No Shortness of breath?: No  Endocrine Excessive thirst?: No  Musculoskeletal Back pain?: No Joint pain?: No  Neurological Headaches?: No Dizziness?: No  Psychologic Depression?: No Anxiety?: No  Physical Exam: BP 130/83 (BP Location: Left Arm, Patient Position: Sitting, Cuff Size: Normal)   Pulse 87   Ht 6\' 1"  (1.854 m)   Wt 235 lb (106.6 kg)   BMI 31.00 kg/m   Constitutional:  Well nourished. Alert and oriented, No acute distress. HEENT: [place] AT, moist mucus membranes.  Trachea midline, no masses. Cardiovascular: No clubbing, cyanosis, or edema. Respiratory: Normal respiratory effort, no increased work of breathing. GI: Abdomen is soft, non tender, non distended, no abdominal masses. Liver and spleen not palpable.  No hernias appreciated.  Stool sample for occult  testing is not indicated.   GU: No CVA tenderness.  No bladder fullness or masses.  Patient with circumcised phallus. Urethral meatus is patent.  No penile discharge. No penile lesions or rashes. Scrotum without lesions, cysts, rashes and/or edema.  Testicles are located scrotally bilaterally. No masses are appreciated in the testicles. Left and right epididymis are normal. Rectal: Patient with  normal sphincter tone. Anus and perineum without scarring or rashes. No rectal masses are appreciated. Prostate is approximately 50 grams, right lobe is indurated but non tender, no nodules are appreciated.  Neurologic: Grossly intact, no focal deficits, moving all 4 extremities. Psychiatric: Normal mood and affect.  Laboratory Data: PSA 1.5 in 11/2017 PSA 11.2 in 02/2019 PSA 10.4 in 04/2019 Lab Results  Component Value Date   WBC 5.3 12/03/2018   HGB 15.2 12/03/2018   HCT 43.9 12/03/2018   MCV 83 12/03/2018   PLT 165 12/03/2018    Lab Results  Component Value Date   CREATININE 1.10 12/18/2018    No results found for: PSA  No results found for: TESTOSTERONE  No results found for: HGBA1C  No results found for: TSH     Component Value Date/Time   CHOL 218 (H) 12/14/2017 1110   HDL 50 12/14/2017 1110   LDLCALC 143 (H) 12/14/2017 1110    Lab Results  Component Value Date   AST 27 01/28/2017   Lab Results  Component Value Date   ALT 29 01/28/2017   No components found for: ALKALINEPHOPHATASE No components found for: BILIRUBINTOTAL  No results found for: ESTRADIOL  Urinalysis > 30 WBC's, > 30 RBC's, many bacteria and nitrite positive.  See Epic.  I have reviewed the labs.   Pertinent Imaging: Results for NILO, FALLIN "TIM" (MRN [id number]) as of 05/02/2019 08:35  Ref. Range 05/02/2019 08:26  Scan Result Unknown 7      Assessment & Plan:    1. Prostatitis/Possible prostate abscess  Prostate ultrasound did not demonstrate an abscess ~ prostate volume 70 cc   2.  Acute urinary retention:    - resolved   3. Elevated PSA Likely the result of prostatitis - will need to repeat once infection has cleared    4. Abnormal prostate exam See above  5. BPH with LU TS I PSS 6/2, it is improved Continue conservative management, avoiding bladder irritants and timed voiding's Continue tamsulosin 0.4 mg daily  6. UTI Positive UA - will send for culture - start Cipro 500 mg - may need to adjust once cultures are available  7. Microscopic hematuria Will need to recheck UA once infection has been treated to ensure microscopic hematuria has cleared   Return for pending urine culture .  These notes generated with voice recognition software. I apologize for typographical errors.  Zara Council, PA-C  [facility] [address]  [place], [place] [address] [phone]

## 2019-05-02 ENCOUNTER — Ambulatory Visit: Payer: Medicare Other | Admitting: Urology

## 2019-05-02 ENCOUNTER — Encounter: Payer: Self-pay | Admitting: Urology

## 2019-05-02 ENCOUNTER — Other Ambulatory Visit: Payer: Self-pay

## 2019-05-02 VITALS — BP 130/83 | HR 87 | Ht 73.0 in | Wt 235.0 lb

## 2019-05-02 DIAGNOSIS — N411 Chronic prostatitis: Secondary | ICD-10-CM | POA: Diagnosis not present

## 2019-05-02 DIAGNOSIS — R972 Elevated prostate specific antigen [PSA]: Secondary | ICD-10-CM | POA: Diagnosis not present

## 2019-05-02 DIAGNOSIS — R3989 Other symptoms and signs involving the genitourinary system: Secondary | ICD-10-CM

## 2019-05-02 DIAGNOSIS — Z8042 Family history of malignant neoplasm of prostate: Secondary | ICD-10-CM

## 2019-05-02 DIAGNOSIS — Z87898 Personal history of other specified conditions: Secondary | ICD-10-CM

## 2019-05-02 LAB — URINALYSIS, COMPLETE
Bilirubin, UA: NEGATIVE
Glucose, UA: NEGATIVE
Ketones, UA: NEGATIVE
Nitrite, UA: POSITIVE — AB
Specific Gravity, UA: 1.02 (ref 1.005–1.030)
Urobilinogen, Ur: 0.2 mg/dL (ref 0.2–1.0)
pH, UA: 6.5 (ref 5.0–7.5)

## 2019-05-02 LAB — BLADDER SCAN AMB NON-IMAGING: Scan Result: 7

## 2019-05-02 LAB — MICROSCOPIC EXAMINATION
RBC, Urine: >30 /hpf — AB (ref 0–2)
WBC, UA: >30 /hpf — AB (ref 0–5)

## 2019-05-02 MED ORDER — CIPROFLOXACIN HCL 500 MG PO TABS: 500.0000 mg | ORAL_TABLET | Freq: Two times a day (BID) | ORAL | 0 refills | Status: DC

## 2019-05-05 LAB — CULTURE, URINE COMPREHENSIVE

## 2019-05-06 ENCOUNTER — Telehealth: Payer: Self-pay

## 2019-05-06 NOTE — Telephone Encounter (Signed)
-----   Message from Nori Riis, PA-C sent at 05/06/2019  6:09 AM EDT ----- Please let [name] know his urine culture was positive and that Cipro is the correct antibiotic.  I would like to check an UA next week to make sure he doesn't continue to have any microscopic blood after being on the antibiotic.

## 2019-05-06 NOTE — Telephone Encounter (Signed)
mychart sent.

## 2019-05-08 ENCOUNTER — Other Ambulatory Visit: Payer: Self-pay

## 2019-05-08 DIAGNOSIS — N390 Urinary tract infection, site not specified: Secondary | ICD-10-CM | POA: Insufficient documentation

## 2019-05-08 DIAGNOSIS — B9689 Other specified bacterial agents as the cause of diseases classified elsewhere: Secondary | ICD-10-CM | POA: Insufficient documentation

## 2019-05-08 MED ORDER — SULFAMETHOXAZOLE-TRIMETHOPRIM 800-160 MG PO TABS: 1.0000 | ORAL_TABLET | Freq: Two times a day (BID) | ORAL | 0 refills | Status: DC

## 2019-05-08 NOTE — Progress Notes (Signed)
.  mychart

## 2019-05-17 ENCOUNTER — Other Ambulatory Visit: Payer: Self-pay

## 2019-05-17 DIAGNOSIS — R3129 Other microscopic hematuria: Secondary | ICD-10-CM

## 2019-05-20 ENCOUNTER — Other Ambulatory Visit: Payer: Self-pay

## 2019-05-20 ENCOUNTER — Other Ambulatory Visit: Payer: Medicare Other

## 2019-05-20 ENCOUNTER — Other Ambulatory Visit: Payer: Self-pay | Admitting: Urology

## 2019-05-20 DIAGNOSIS — R3129 Other microscopic hematuria: Secondary | ICD-10-CM

## 2019-05-20 DIAGNOSIS — R109 Unspecified abdominal pain: Secondary | ICD-10-CM

## 2019-05-20 DIAGNOSIS — Z87442 Personal history of urinary calculi: Secondary | ICD-10-CM

## 2019-05-20 LAB — URINALYSIS, COMPLETE
Bilirubin, UA: NEGATIVE
Glucose, UA: NEGATIVE
Ketones, UA: NEGATIVE
Leukocytes,UA: NEGATIVE
Nitrite, UA: NEGATIVE
Protein,UA: NEGATIVE
RBC, UA: NEGATIVE
Specific Gravity, UA: 1.01 (ref 1.005–1.030)
Urobilinogen, Ur: 0.2 mg/dL (ref 0.2–1.0)
pH, UA: 5.5 (ref 5.0–7.5)

## 2019-05-20 LAB — MICROSCOPIC EXAMINATION
Bacteria, UA: NONE SEEN
Epithelial Cells (non renal): NONE SEEN /hpf (ref 0–10)
RBC, Urine: NONE SEEN /hpf (ref 0–2)

## 2019-05-24 ENCOUNTER — Telehealth: Payer: Self-pay

## 2019-05-24 MED ORDER — CIPROFLOXACIN HCL 500 MG PO TABS: 500.0000 mg | ORAL_TABLET | Freq: Two times a day (BID) | ORAL | 0 refills | Status: DC

## 2019-05-24 NOTE — Telephone Encounter (Signed)
Script refill sent per [employer]

## 2019-05-28 ENCOUNTER — Other Ambulatory Visit: Payer: Self-pay | Admitting: Neurosurgery

## 2019-05-31 ENCOUNTER — Other Ambulatory Visit: Payer: Self-pay | Admitting: Neurosurgery

## 2019-05-31 DIAGNOSIS — M5126 Other intervertebral disc displacement, lumbar region: Secondary | ICD-10-CM

## 2019-06-03 ENCOUNTER — Other Ambulatory Visit: Payer: Self-pay

## 2019-06-03 ENCOUNTER — Ambulatory Visit
Admission: RE | Admit: 2019-06-03 | Discharge: 2019-06-03 | Disposition: A | Discharge status: Home / Self Care | Payer: Medicare Other | Source: Ambulatory Visit | Attending: Urology | Admitting: Urology

## 2019-06-03 DIAGNOSIS — Z87442 Personal history of urinary calculi: Secondary | ICD-10-CM

## 2019-06-03 DIAGNOSIS — R109 Unspecified abdominal pain: Secondary | ICD-10-CM | POA: Insufficient documentation

## 2019-06-04 ENCOUNTER — Other Ambulatory Visit: Payer: Self-pay | Admitting: Family Medicine

## 2019-06-04 DIAGNOSIS — R972 Elevated prostate specific antigen [PSA]: Secondary | ICD-10-CM

## 2019-06-04 DIAGNOSIS — R3129 Other microscopic hematuria: Secondary | ICD-10-CM

## 2019-06-05 ENCOUNTER — Other Ambulatory Visit: Payer: Self-pay

## 2019-06-05 ENCOUNTER — Other Ambulatory Visit: Payer: Medicare Other

## 2019-06-05 ENCOUNTER — Ambulatory Visit
Admission: RE | Admit: 2019-06-05 | Discharge: 2019-06-05 | Disposition: A | Discharge status: Home / Self Care | Payer: BC Managed Care – PPO | Source: Ambulatory Visit | Attending: Neurosurgery | Admitting: Neurosurgery

## 2019-06-05 DIAGNOSIS — R3129 Other microscopic hematuria: Secondary | ICD-10-CM

## 2019-06-05 DIAGNOSIS — M5126 Other intervertebral disc displacement, lumbar region: Secondary | ICD-10-CM

## 2019-06-05 DIAGNOSIS — R972 Elevated prostate specific antigen [PSA]: Secondary | ICD-10-CM

## 2019-06-06 ENCOUNTER — Telehealth: Payer: Self-pay | Admitting: Urology

## 2019-06-06 LAB — URINALYSIS, COMPLETE
Bilirubin, UA: NEGATIVE
Glucose, UA: NEGATIVE
Ketones, UA: NEGATIVE
Leukocytes,UA: NEGATIVE
Nitrite, UA: NEGATIVE
Protein,UA: NEGATIVE
Specific Gravity, UA: 1.01 (ref 1.005–1.030)
Urobilinogen, Ur: 0.2 mg/dL (ref 0.2–1.0)
pH, UA: 5.5 (ref 5.0–7.5)

## 2019-06-06 LAB — MICROSCOPIC EXAMINATION
Bacteria, UA: NONE SEEN
Epithelial Cells (non renal): NONE SEEN /hpf (ref 0–10)
RBC, Urine: NONE SEEN /hpf (ref 0–2)

## 2019-06-06 LAB — PSA: Prostate Specific Ag, Serum: 8.1 ng/mL — ABNORMAL HIGH (ref 0.0–4.0)

## 2019-06-06 NOTE — Telephone Encounter (Signed)
Labs reviewed, see additional documentation.

## 2019-06-06 NOTE — Telephone Encounter (Signed)
Would you check on [name] UA and PSA results?  He came in yesterday, but the results have not posted.

## 2019-06-08 LAB — CULTURE, URINE COMPREHENSIVE

## 2019-06-10 ENCOUNTER — Encounter (HOSPITAL_COMMUNITY): Payer: Self-pay

## 2019-06-10 ENCOUNTER — Other Ambulatory Visit: Payer: Self-pay

## 2019-06-10 ENCOUNTER — Other Ambulatory Visit (HOSPITAL_COMMUNITY)
Admission: RE | Admit: 2019-06-10 | Discharge: 2019-06-10 | Disposition: A | Discharge status: Home / Self Care | Payer: Medicare Other | Source: Ambulatory Visit | Attending: Neurosurgery | Admitting: Neurosurgery

## 2019-06-10 ENCOUNTER — Encounter (HOSPITAL_COMMUNITY)
Admission: RE | Admit: 2019-06-10 | Discharge: 2019-06-10 | Disposition: A | Discharge status: Home / Self Care | Payer: Medicare Other | Source: Ambulatory Visit | Attending: Neurosurgery | Admitting: Neurosurgery

## 2019-06-10 DIAGNOSIS — Z01812 Encounter for preprocedural laboratory examination: Secondary | ICD-10-CM | POA: Insufficient documentation

## 2019-06-10 DIAGNOSIS — Z20828 Contact with and (suspected) exposure to other viral communicable diseases: Secondary | ICD-10-CM | POA: Insufficient documentation

## 2019-06-10 DIAGNOSIS — M5126 Other intervertebral disc displacement, lumbar region: Secondary | ICD-10-CM | POA: Insufficient documentation

## 2019-06-10 HISTORY — DX: Other complications of anesthesia, initial encounter: T88.59XA

## 2019-06-10 HISTORY — DX: Unspecified hearing loss, unspecified ear: H91.90

## 2019-06-10 HISTORY — DX: Personal history of urinary calculi: Z87.442

## 2019-06-10 LAB — BASIC METABOLIC PANEL
Anion gap: 8 (ref 5–15)
BUN: 11 mg/dL (ref 8–23)
CO2: 26 mmol/L (ref 22–32)
Calcium: 9 mg/dL (ref 8.9–10.3)
Chloride: 105 mmol/L (ref 98–111)
Creatinine, Ser: 1.08 mg/dL (ref 0.61–1.24)
GFR calc Af Amer: >60 mL/min (ref >60)
GFR calc non Af Amer: >60 mL/min (ref >60)
Glucose, Bld: 108 mg/dL — ABNORMAL HIGH (ref 70–99)
Potassium: 3.8 mmol/L (ref 3.5–5.1)
Sodium: 139 mmol/L (ref 135–145)

## 2019-06-10 LAB — SURGICAL PCR SCREEN
MRSA, PCR: NEGATIVE
Staphylococcus aureus: NEGATIVE

## 2019-06-10 LAB — CBC
HCT: 42 % (ref 39.0–52.0)
Hemoglobin: 13.3 g/dL (ref 13.0–17.0)
MCH: 26.4 pg (ref 26.0–34.0)
MCHC: 31.7 g/dL (ref 30.0–36.0)
MCV: 83.3 fL (ref 80.0–100.0)
Platelets: 197 10*3/uL (ref 150–400)
RBC: 5.04 MIL/uL (ref 4.22–5.81)
RDW: 13.4 % (ref 11.5–15.5)
WBC: 6.1 10*3/uL (ref 4.0–10.5)
nRBC: 0 % (ref 0.0–0.2)

## 2019-06-10 LAB — ABO/RH: ABO/RH(D): O NEG

## 2019-06-10 LAB — SARS CORONAVIRUS 2 (TAT 6-24 HRS): SARS Coronavirus 2: NEGATIVE

## 2019-06-10 NOTE — Progress Notes (Signed)
PCP - Dr. Richmond Campbell  Cardiologist - Dr Idamae Lusher  Chest x-ray - 12/03/2018  EKG - 01/09/2019  Stress Test -   ECHO -   Cardiac Cath -   Sleep Study - no CPAP - no  LABS-CBC, BMP, T/S, PCR  ASA- stopped 06/05/2019-"PCP prescribed years ago"  ERAS-no  HA1C-na Fasting Blood Sugar - na Checks Blood Sugar na__ times a day  Anesthesia- Mr Beaver went into Afib, some years ago, patient reported that it was caused by a cold milkshake; Cardizem was increased for 2 days and patient converted to NSR- per patient  Pt denies having chest pain, sob, or fever at this time. All instructions explained to the pt, with a verbal understanding of the material. Pt agrees to go over the instructions while at home for a better understanding. Pt also instructed to self quarantine after being tested for COVID-19. The opportunity to ask questions was provided.  I instructed patient the cell phone should be turned off when staff is with patient; to avoid distraction of patient of staff.

## 2019-06-10 NOTE — Pre-Procedure Instructions (Signed)
DAYMEN HASSEBROCK  06/10/2019     Your procedure is scheduled on Wednesday,. August 19..  Report to [facility], Main Entrance or Entrance "A" at 11:30 AM                Your surgery or procedure is scheduled for 1:30 PM   Call this number if you have problems the morning of surgery:[phone]  This is the number for the Pre- Surgical Desk.   Remember:  Do not eat or drink after midnight Tuesday, August  18                   Take these medicines the morning of surgery with A SIP OF WATER:             diltiazem (CARDIZEM CD)             Docusate Sodium (COLACE PO)             tamsulosin (FLOMAX)             Take if needed:           fexofenadine (ALLEGRA)           fluticasone (FLONASE)                Follow your surgeon's Instructions regarding holding or continuing Aspirin  STOP taking Aspirin, Aspirin Products (Goody Powder, Excedrin Migraine), Ibuprofen (Advil), Naproxen (Aleve), celecoxib (CELEBREX) Vitamins) Vitamins and Herbal Products (ie Fish Oil).     Special instructions:   [facility]- Preparing For Surgery  Before surgery, you can play an important role. Because skin is not sterile, your skin needs to be as free of germs as possible. You can reduce the number of germs on your skin by washing with CHG (chlorahexidine gluconate) Soap before surgery.  CHG is an antiseptic cleaner which kills germs and bonds with the skin to continue killing germs even after washing.    Oral Hygiene is also important to reduce your risk of infection.  Remember - BRUSH YOUR TEETH THE MORNING OF SURGERY WITH YOUR REGULAR TOOTHPASTE  Please do not use if you have an allergy to CHG or antibacterial soaps. If your skin becomes reddened/irritated stop using the CHG.  Do not shave (including legs and underarms) for at least 48 hours prior to first CHG shower. It is OK to shave your face.  Please follow these instructions carefully.   1. Shower the NIGHT BEFORE SURGERY and the  MORNING OF SURGERY with CHG.   2. If you chose to wash your hair, wash your hair first as usual with your normal shampoo. 3.  3.   After you shampoo, wash your face and private area with the soap you use at home, then rinse your hair and body thoroughly to remove the shampoo and soap.  Use CHG as you would any other liquid soap. You can apply CHG directly  Apply the CHG Soap to your body ONLY FROM THE NECK DOWN.  Do not use on open wounds or open sores. Avoid contact with your eyes, ears, mouth and genitals (private parts).   4. Wash thoroughly, paying special attention to the area where your surgery will be performed.  5. Thoroughly rinse your body with warm water from the neck down.  6. DO NOT shower/wash with your normal soap after using and rinsing off the CHG Soap.  7. Pat yourself dry with a CLEAN TOWEL.  8. Wear CLEAN PAJAMAS to  bed the night before surgery, wear comfortable clothes the morning of surgery  9. Place CLEAN SHEETS on your bed the night of your first shower and DO NOT SLEEP WITH PETS.  Day of Surgery: Shower as instructed above. Do not wear lotions, powders, or perfumes, or deodorant. Please wear clean clothes to the hospital/surgery center.   Remember to brush your teeth WITH YOUR REGULAR TOOTHPASTE.  Do not wear jewelry, make-up or nail polish.             Men may shave face and neck.  Do not bring valuables to the hospital.  [facility] is not responsible for any belongings or valuables.  Contacts, dentures or bridgework may not be worn into surgery.  Leave your suitcase in the car.  After surgery it may be brought to your room.  For patients admitted to the hospital, discharge time will be determined by your treatment team.  Patients discharged the day of surgery will not be allowed to drive home.   Please read over the following fact sheets that you were given. Pain Booklet, Coughing and Deep Breathing, Surgical Site Infections.

## 2019-06-11 LAB — TYPE AND SCREEN
ABO/RH(D): O NEG
Antibody Screen: NEGATIVE

## 2019-06-11 NOTE — Anesthesia Preprocedure Evaluation (Addendum)
Anesthesia Evaluation  Patient identified by MRN, date of birth, ID band Patient awake    Reviewed: Allergy & Precautions, NPO status , Patient's Chart, lab work & pertinent test results  Airway Mallampati: II  TM Distance: >3 FB Neck ROM: Full    Dental  (+) Chipped,    Pulmonary neg pulmonary ROS,    Pulmonary exam normal breath sounds clear to auscultation       Cardiovascular hypertension, Pt. on medications Normal cardiovascular exam+ dysrhythmias Atrial Fibrillation  Rhythm:Regular Rate:Normal     Neuro/Psych negative neurological ROS  negative psych ROS   GI/Hepatic negative GI ROS, Neg liver ROS,   Endo/Other  negative endocrine ROS  Renal/GU negative Renal ROS     Musculoskeletal negative musculoskeletal ROS (+)   Abdominal (+) + obese,   Peds  Hematology negative hematology ROS (+) HLD   Anesthesia Other Findings HNP  Reproductive/Obstetrics                           Anesthesia Physical Anesthesia Plan  ASA: II  Anesthesia Plan: General   Post-op Pain Management:    Induction: Intravenous  PONV Risk Score and Plan: 3 and Ondansetron, Dexamethasone, Midazolam and Treatment may vary due to age or medical condition  Airway Management Planned: Oral ETT  Additional Equipment:   Intra-op Plan:   Post-operative Plan: Extubation in OR  Informed Consent: I have reviewed the patients History and Physical, chart, labs and discussed the procedure including the risks, benefits and alternatives for the proposed anesthesia with the patient or authorized representative who has indicated his/her understanding and acceptance.     Dental advisory given  Plan Discussed with: CRNA  Anesthesia Plan Comments: (Per PA-C: Follows with [facility]ology at [facility], Dr. Nehemiah Massed, for paroxysmal afib and HTN. He was seen and cleared 01/09/19. Per note "Proceed to surgery and/or invasive procedure  without restriction to pre or post operative and/or procedural care. The patient is at lowest risk possible for cardiovascular complications with surgical intervention and/or invasive procedure. Currently has no evidence active and/or significant angina and/or congestive heart failure. The patient may discontinue aspirin 7 days prior to procedure and restart at a safe period thereafter."  )      Anesthesia Quick Evaluation

## 2019-06-12 ENCOUNTER — Encounter (HOSPITAL_COMMUNITY)
Admission: RE | Disposition: A | Discharge status: Home / Self Care | Payer: Self-pay | Source: Home / Self Care | Attending: Neurosurgery

## 2019-06-12 ENCOUNTER — Inpatient Hospital Stay (HOSPITAL_COMMUNITY): Payer: Medicare Other | Admitting: Physician Assistant

## 2019-06-12 ENCOUNTER — Inpatient Hospital Stay (HOSPITAL_COMMUNITY): Payer: Medicare Other

## 2019-06-12 ENCOUNTER — Inpatient Hospital Stay (HOSPITAL_COMMUNITY)
Admission: RE | Admit: 2019-06-12 | Discharge: 2019-06-13 | DRG: 460 | Disposition: A | Discharge status: Home / Self Care | Payer: Medicare Other | Attending: Neurosurgery | Admitting: Neurosurgery

## 2019-06-12 ENCOUNTER — Inpatient Hospital Stay (HOSPITAL_COMMUNITY): Payer: Medicare Other | Admitting: Anesthesiology

## 2019-06-12 ENCOUNTER — Encounter (HOSPITAL_COMMUNITY): Payer: Self-pay | Admitting: Certified Registered Nurse Anesthetist

## 2019-06-12 ENCOUNTER — Other Ambulatory Visit: Payer: Self-pay

## 2019-06-12 DIAGNOSIS — M5116 Intervertebral disc disorders with radiculopathy, lumbar region: Secondary | ICD-10-CM | POA: Diagnosis present

## 2019-06-12 DIAGNOSIS — Z87891 Personal history of nicotine dependence: Secondary | ICD-10-CM | POA: Diagnosis not present

## 2019-06-12 DIAGNOSIS — Z419 Encounter for procedure for purposes other than remedying health state, unspecified: Secondary | ICD-10-CM

## 2019-06-12 DIAGNOSIS — I4891 Unspecified atrial fibrillation: Secondary | ICD-10-CM | POA: Diagnosis present

## 2019-06-12 DIAGNOSIS — E785 Hyperlipidemia, unspecified: Secondary | ICD-10-CM | POA: Diagnosis present

## 2019-06-12 DIAGNOSIS — I10 Essential (primary) hypertension: Secondary | ICD-10-CM | POA: Diagnosis present

## 2019-06-12 DIAGNOSIS — Z7982 Long term (current) use of aspirin: Secondary | ICD-10-CM | POA: Diagnosis not present

## 2019-06-12 DIAGNOSIS — M5126 Other intervertebral disc displacement, lumbar region: Secondary | ICD-10-CM | POA: Diagnosis present

## 2019-06-12 HISTORY — PX: TRANSFORAMINAL LUMBAR INTERBODY FUSION (TLIF) WITH PEDICLE SCREW FIXATION 1 LEVEL: SHX6141

## 2019-06-12 SURGERY — TRANSFORAMINAL LUMBAR INTERBODY FUSION (TLIF) WITH PEDICLE SCREW FIXATION 1 LEVEL
Anesthesia: General | Site: Spine Lumbar

## 2019-06-12 MED ORDER — ONDANSETRON HCL 4 MG/2ML IJ SOLN: 4.0000 mg | Freq: Four times a day (QID) | INTRAMUSCULAR | Status: DC | PRN

## 2019-06-12 MED ORDER — GLYCOPYRROLATE 0.2 MG/ML IJ SOLN
INTRAMUSCULAR | Status: DC | PRN
  Administered 2019-06-12: 0.2 mg via INTRAVENOUS

## 2019-06-12 MED ORDER — POTASSIUM CHLORIDE CRYS ER 20 MEQ PO TBCR: 20.0000 meq | EXTENDED_RELEASE_TABLET | ORAL | Status: DC

## 2019-06-12 MED ORDER — ALBUMIN HUMAN 5 % IV SOLN
INTRAVENOUS | Status: DC | PRN
  Administered 2019-06-12 (×2): via INTRAVENOUS

## 2019-06-12 MED ORDER — PROMETHAZINE HCL 25 MG/ML IJ SOLN: 6.2500 mg | INTRAMUSCULAR | Status: DC | PRN

## 2019-06-12 MED ORDER — BUPIVACAINE LIPOSOME 1.3 % IJ SUSP
20.0000 mL | INTRAMUSCULAR | Status: DC
  Filled 2019-06-12: qty 20

## 2019-06-12 MED ORDER — THROMBIN 20000 UNITS EX KIT
PACK | CUTANEOUS | Status: AC
  Filled 2019-06-12: qty 1

## 2019-06-12 MED ORDER — LIDOCAINE-EPINEPHRINE 1 %-1:100000 IJ SOLN
INTRAMUSCULAR | Status: DC | PRN
  Administered 2019-06-12: 10 mL

## 2019-06-12 MED ORDER — CEFAZOLIN SODIUM-DEXTROSE 2-4 GM/100ML-% IV SOLN
2.0000 g | INTRAVENOUS | Status: AC
  Administered 2019-06-12: 2 g via INTRAVENOUS
  Filled 2019-06-12: qty 100

## 2019-06-12 MED ORDER — DEXAMETHASONE SODIUM PHOSPHATE 10 MG/ML IJ SOLN
INTRAMUSCULAR | Status: DC | PRN
  Administered 2019-06-12: 10 mg via INTRAVENOUS

## 2019-06-12 MED ORDER — DOCUSATE SODIUM 100 MG PO CAPS
100.0000 mg | ORAL_CAPSULE | Freq: Every day | ORAL | Status: DC
  Administered 2019-06-12 – 2019-06-13 (×2): 100 mg via ORAL
  Filled 2019-06-12 (×2): qty 1

## 2019-06-12 MED ORDER — EPHEDRINE SULFATE-NACL 50-0.9 MG/10ML-% IV SOSY
PREFILLED_SYRINGE | INTRAVENOUS | Status: DC | PRN
  Administered 2019-06-12: 10 mg via INTRAVENOUS
  Administered 2019-06-12: 5 mg via INTRAVENOUS

## 2019-06-12 MED ORDER — PANTOPRAZOLE SODIUM 40 MG PO TBEC
40.0000 mg | DELAYED_RELEASE_TABLET | Freq: Every evening | ORAL | Status: DC
  Administered 2019-06-12: 40 mg via ORAL
  Filled 2019-06-12: qty 1

## 2019-06-12 MED ORDER — SODIUM CHLORIDE 0.9% FLUSH: 3.0000 mL | Freq: Two times a day (BID) | INTRAVENOUS | Status: DC

## 2019-06-12 MED ORDER — BUPIVACAINE LIPOSOME 1.3 % IJ SUSP
INTRAMUSCULAR | Status: DC | PRN
  Administered 2019-06-12: 20 mL

## 2019-06-12 MED ORDER — OXYCODONE HCL 5 MG/5ML PO SOLN: 5.0000 mg | Freq: Once | ORAL | Status: AC | PRN

## 2019-06-12 MED ORDER — OXYCODONE HCL 5 MG PO TABS
ORAL_TABLET | ORAL | Status: AC
  Filled 2019-06-12: qty 1

## 2019-06-12 MED ORDER — FLUTICASONE PROPIONATE 50 MCG/ACT NA SUSP
1.0000 | Freq: Every day | NASAL | Status: DC | PRN
  Filled 2019-06-12: qty 16

## 2019-06-12 MED ORDER — LORATADINE 10 MG PO TABS: 10.0000 mg | ORAL_TABLET | Freq: Every day | ORAL | Status: DC

## 2019-06-12 MED ORDER — CEFAZOLIN SODIUM-DEXTROSE 2-4 GM/100ML-% IV SOLN
2.0000 g | Freq: Three times a day (TID) | INTRAVENOUS | Status: AC
  Administered 2019-06-12 – 2019-06-13 (×2): 2 g via INTRAVENOUS
  Filled 2019-06-12 (×2): qty 100

## 2019-06-12 MED ORDER — FENTANYL CITRATE (PF) 250 MCG/5ML IJ SOLN
INTRAMUSCULAR | Status: AC
  Filled 2019-06-12: qty 5

## 2019-06-12 MED ORDER — DEXAMETHASONE SODIUM PHOSPHATE 10 MG/ML IJ SOLN
10.0000 mg | Freq: Once | INTRAMUSCULAR | Status: DC
  Filled 2019-06-12: qty 1

## 2019-06-12 MED ORDER — CHLORHEXIDINE GLUCONATE CLOTH 2 % EX PADS: 6.0000 | MEDICATED_PAD | Freq: Once | CUTANEOUS | Status: DC

## 2019-06-12 MED ORDER — PHENYLEPHRINE 40 MCG/ML (10ML) SYRINGE FOR IV PUSH (FOR BLOOD PRESSURE SUPPORT)
PREFILLED_SYRINGE | INTRAVENOUS | Status: DC | PRN
  Administered 2019-06-12: 80 ug via INTRAVENOUS

## 2019-06-12 MED ORDER — ROCURONIUM BROMIDE 10 MG/ML (PF) SYRINGE
PREFILLED_SYRINGE | INTRAVENOUS | Status: AC
  Filled 2019-06-12: qty 10

## 2019-06-12 MED ORDER — PANTOPRAZOLE SODIUM 40 MG IV SOLR: 40.0000 mg | Freq: Every day | INTRAVENOUS | Status: DC

## 2019-06-12 MED ORDER — LACTATED RINGERS IV SOLN
INTRAVENOUS | Status: DC
  Administered 2019-06-12 (×2): via INTRAVENOUS

## 2019-06-12 MED ORDER — THROMBIN 20000 UNITS EX SOLR
CUTANEOUS | Status: AC
  Filled 2019-06-12: qty 20000

## 2019-06-12 MED ORDER — TAMSULOSIN HCL 0.4 MG PO CAPS
0.4000 mg | ORAL_CAPSULE | Freq: Every day | ORAL | Status: DC
  Administered 2019-06-13: 0.4 mg via ORAL
  Filled 2019-06-12: qty 1

## 2019-06-12 MED ORDER — CIPROFLOXACIN HCL 500 MG PO TABS
500.0000 mg | ORAL_TABLET | Freq: Two times a day (BID) | ORAL | Status: DC
  Administered 2019-06-12 – 2019-06-13 (×2): 500 mg via ORAL
  Filled 2019-06-12 (×2): qty 1

## 2019-06-12 MED ORDER — SPIRONOLACTONE 12.5 MG HALF TABLET
25.0000 mg | ORAL_TABLET | Freq: Every day | ORAL | Status: DC
  Administered 2019-06-12 – 2019-06-13 (×2): 25 mg via ORAL
  Filled 2019-06-12 (×2): qty 2

## 2019-06-12 MED ORDER — POTASSIUM CHLORIDE CRYS ER 20 MEQ PO TBCR
40.0000 meq | EXTENDED_RELEASE_TABLET | Freq: Every day | ORAL | Status: DC
  Administered 2019-06-12: 40 meq via ORAL
  Filled 2019-06-12: qty 2

## 2019-06-12 MED ORDER — CYCLOBENZAPRINE HCL 10 MG PO TABS
10.0000 mg | ORAL_TABLET | Freq: Three times a day (TID) | ORAL | Status: DC | PRN
  Administered 2019-06-12 – 2019-06-13 (×2): 10 mg via ORAL
  Filled 2019-06-12 (×2): qty 1

## 2019-06-12 MED ORDER — SUGAMMADEX SODIUM 200 MG/2ML IV SOLN
INTRAVENOUS | Status: DC | PRN
  Administered 2019-06-12: 450 mg via INTRAVENOUS

## 2019-06-12 MED ORDER — PROPOFOL 10 MG/ML IV BOLUS
INTRAVENOUS | Status: AC
  Filled 2019-06-12: qty 20

## 2019-06-12 MED ORDER — LIDOCAINE-EPINEPHRINE 1 %-1:100000 IJ SOLN
INTRAMUSCULAR | Status: AC
  Filled 2019-06-12: qty 1

## 2019-06-12 MED ORDER — ACETAMINOPHEN 650 MG RE SUPP: 650.0000 mg | RECTAL | Status: DC | PRN

## 2019-06-12 MED ORDER — MIDAZOLAM HCL 2 MG/2ML IJ SOLN
INTRAMUSCULAR | Status: DC | PRN
  Administered 2019-06-12: 2 mg via INTRAVENOUS

## 2019-06-12 MED ORDER — ASPIRIN 325 MG PO TABS
325.0000 mg | ORAL_TABLET | Freq: Every evening | ORAL | Status: DC
  Administered 2019-06-12: 325 mg via ORAL
  Filled 2019-06-12: qty 1

## 2019-06-12 MED ORDER — PHENOL 1.4 % MT LIQD: 1.0000 | OROMUCOSAL | Status: DC | PRN

## 2019-06-12 MED ORDER — OXYCODONE HCL 5 MG PO TABS
5.0000 mg | ORAL_TABLET | Freq: Once | ORAL | Status: AC | PRN
  Administered 2019-06-12: 5 mg via ORAL

## 2019-06-12 MED ORDER — CELECOXIB 200 MG PO CAPS
200.0000 mg | ORAL_CAPSULE | Freq: Every evening | ORAL | Status: DC
  Administered 2019-06-12: 200 mg via ORAL
  Filled 2019-06-12: qty 1

## 2019-06-12 MED ORDER — FLUOROURACIL 5 % EX CREA: 1.0000 "application " | TOPICAL_CREAM | Freq: Every day | CUTANEOUS | Status: DC | PRN

## 2019-06-12 MED ORDER — MULTI-VITAMIN/MINERALS PO TABS: 1.0000 | ORAL_TABLET | Freq: Every day | ORAL | Status: DC

## 2019-06-12 MED ORDER — HYDROMORPHONE HCL 1 MG/ML IJ SOLN: 0.5000 mg | INTRAMUSCULAR | Status: DC | PRN

## 2019-06-12 MED ORDER — FENTANYL CITRATE (PF) 250 MCG/5ML IJ SOLN
INTRAMUSCULAR | Status: DC | PRN
  Administered 2019-06-12: 50 ug via INTRAVENOUS
  Administered 2019-06-12: 100 ug via INTRAVENOUS
  Administered 2019-06-12: 50 ug via INTRAVENOUS
  Administered 2019-06-12: 150 ug via INTRAVENOUS

## 2019-06-12 MED ORDER — MONTELUKAST SODIUM 10 MG PO TABS
10.0000 mg | ORAL_TABLET | Freq: Every day | ORAL | Status: DC
  Administered 2019-06-12 – 2019-06-13 (×2): 10 mg via ORAL
  Filled 2019-06-12 (×2): qty 1

## 2019-06-12 MED ORDER — MIDAZOLAM HCL 2 MG/2ML IJ SOLN
INTRAMUSCULAR | Status: AC
  Filled 2019-06-12: qty 2

## 2019-06-12 MED ORDER — SPIRONOLACTONE 25 MG PO TABS
25.0000 mg | ORAL_TABLET | Freq: Every day | ORAL | Status: DC
  Filled 2019-06-12: qty 1

## 2019-06-12 MED ORDER — DILTIAZEM HCL ER COATED BEADS 120 MG PO CP24
120.0000 mg | ORAL_CAPSULE | Freq: Every day | ORAL | Status: DC
  Administered 2019-06-13: 120 mg via ORAL
  Filled 2019-06-12: qty 1

## 2019-06-12 MED ORDER — HYDROMORPHONE HCL 1 MG/ML IJ SOLN
INTRAMUSCULAR | Status: AC
  Administered 2019-06-12: 0.25 mg via INTRAVENOUS
  Filled 2019-06-12: qty 1

## 2019-06-12 MED ORDER — ROCURONIUM BROMIDE 10 MG/ML (PF) SYRINGE
PREFILLED_SYRINGE | INTRAVENOUS | Status: DC | PRN
  Administered 2019-06-12: 40 mg via INTRAVENOUS
  Administered 2019-06-12: 20 mg via INTRAVENOUS
  Administered 2019-06-12 (×2): 30 mg via INTRAVENOUS

## 2019-06-12 MED ORDER — ACETAMINOPHEN 500 MG PO TABS
1000.0000 mg | ORAL_TABLET | Freq: Once | ORAL | Status: AC
  Administered 2019-06-12: 1000 mg via ORAL
  Filled 2019-06-12: qty 2

## 2019-06-12 MED ORDER — HYDROMORPHONE HCL 1 MG/ML IJ SOLN
0.2500 mg | INTRAMUSCULAR | Status: DC | PRN
  Administered 2019-06-12 (×2): 0.25 mg via INTRAVENOUS

## 2019-06-12 MED ORDER — LISINOPRIL 20 MG PO TABS
20.0000 mg | ORAL_TABLET | Freq: Every evening | ORAL | Status: DC
  Administered 2019-06-12: 20 mg via ORAL
  Filled 2019-06-12: qty 1

## 2019-06-12 MED ORDER — MENTHOL 3 MG MT LOZG: 1.0000 | LOZENGE | OROMUCOSAL | Status: DC | PRN

## 2019-06-12 MED ORDER — THROMBIN 20000 UNITS EX SOLR
CUTANEOUS | Status: DC | PRN
  Administered 2019-06-12: 20 mL via TOPICAL

## 2019-06-12 MED ORDER — PROPOFOL 10 MG/ML IV BOLUS
INTRAVENOUS | Status: DC | PRN
  Administered 2019-06-12: 50 mg via INTRAVENOUS
  Administered 2019-06-12: 100 mg via INTRAVENOUS
  Administered 2019-06-12: 50 mg via INTRAVENOUS

## 2019-06-12 MED ORDER — SODIUM CHLORIDE 0.9 % IV SOLN
INTRAVENOUS | Status: DC | PRN
  Administered 2019-06-12: 20 ug/min via INTRAVENOUS

## 2019-06-12 MED ORDER — ONDANSETRON HCL 4 MG/2ML IJ SOLN
INTRAMUSCULAR | Status: DC | PRN
  Administered 2019-06-12: 4 mg via INTRAVENOUS

## 2019-06-12 MED ORDER — GLYCOPYRROLATE PF 0.2 MG/ML IJ SOSY
PREFILLED_SYRINGE | INTRAMUSCULAR | Status: AC
  Filled 2019-06-12: qty 1

## 2019-06-12 MED ORDER — ALUM & MAG HYDROXIDE-SIMETH 200-200-20 MG/5ML PO SUSP: 30.0000 mL | Freq: Four times a day (QID) | ORAL | Status: DC | PRN

## 2019-06-12 MED ORDER — ACETAMINOPHEN 325 MG PO TABS
650.0000 mg | ORAL_TABLET | ORAL | Status: DC | PRN
  Filled 2019-06-12: qty 2

## 2019-06-12 MED ORDER — ADULT MULTIVITAMIN W/MINERALS CH
1.0000 | ORAL_TABLET | Freq: Every day | ORAL | Status: DC
  Administered 2019-06-13: 1 via ORAL
  Filled 2019-06-12: qty 1

## 2019-06-12 MED ORDER — OXYCODONE HCL 5 MG PO TABS
10.0000 mg | ORAL_TABLET | ORAL | Status: DC | PRN
  Administered 2019-06-12 – 2019-06-13 (×5): 10 mg via ORAL
  Filled 2019-06-12 (×5): qty 2

## 2019-06-12 MED ORDER — LIDOCAINE 2% (20 MG/ML) 5 ML SYRINGE
INTRAMUSCULAR | Status: DC | PRN
  Administered 2019-06-12: 60 mg via INTRAVENOUS

## 2019-06-12 MED ORDER — THROMBIN 5000 UNITS EX SOLR
CUTANEOUS | Status: AC
  Filled 2019-06-12: qty 5000

## 2019-06-12 MED ORDER — POTASSIUM CHLORIDE CRYS ER 20 MEQ PO TBCR
20.0000 meq | EXTENDED_RELEASE_TABLET | Freq: Every morning | ORAL | Status: DC
  Administered 2019-06-13: 20 meq via ORAL
  Filled 2019-06-12: qty 1

## 2019-06-12 MED ORDER — ONDANSETRON HCL 4 MG PO TABS: 4.0000 mg | ORAL_TABLET | Freq: Four times a day (QID) | ORAL | Status: DC | PRN

## 2019-06-12 MED ORDER — SODIUM CHLORIDE 0.9% FLUSH: 3.0000 mL | INTRAVENOUS | Status: DC | PRN

## 2019-06-12 MED ORDER — SODIUM CHLORIDE 0.9 % IV SOLN: 250.0000 mL | INTRAVENOUS | Status: DC

## 2019-06-12 MED ORDER — SODIUM CHLORIDE 0.9 % IV SOLN
INTRAVENOUS | Status: DC | PRN
  Administered 2019-06-12: 500 mL

## 2019-06-12 SURGICAL SUPPLY — 70 items
BAG DECANTER FOR FLEXI CONT (MISCELLANEOUS) ×2 IMPLANT
BENZOIN TINCTURE PRP APPL 2/3 (GAUZE/BANDAGES/DRESSINGS) ×2 IMPLANT
BLADE CLIPPER SURG (BLADE) IMPLANT
BLADE SURG 11 STRL SS (BLADE) ×2 IMPLANT
BONE VIVIGEN FORMABLE 5.4CC (Bone Implant) ×2 IMPLANT
BUR MATCHSTICK NEURO 3.0 LAGG (BURR) ×2 IMPLANT
BUR PRECISION FLUTE 6.0 (BURR) ×2 IMPLANT
CANISTER SUCT 3000ML PPV (MISCELLANEOUS) ×2 IMPLANT
CAP LOCKING (Cap) ×4 IMPLANT
CAP LOCKING 5.5 CREO (Cap) ×4 IMPLANT
CARTRIDGE OIL MAESTRO DRILL (MISCELLANEOUS) ×1 IMPLANT
CONT SPEC 4OZ CLIKSEAL STRL BL (MISCELLANEOUS) ×2 IMPLANT
COVER BACK TABLE 60X90IN (DRAPES) ×2 IMPLANT
COVER WAND RF STERILE (DRAPES) ×2 IMPLANT
DECANTER SPIKE VIAL GLASS SM (MISCELLANEOUS) ×2 IMPLANT
DERMABOND ADVANCED (GAUZE/BANDAGES/DRESSINGS) ×1
DERMABOND ADVANCED .7 DNX12 (GAUZE/BANDAGES/DRESSINGS) ×1 IMPLANT
DIFFUSER DRILL AIR PNEUMATIC (MISCELLANEOUS) ×2 IMPLANT
DRAPE C-ARM 42X72 X-RAY (DRAPES) ×2 IMPLANT
DRAPE C-ARMOR (DRAPES) ×2 IMPLANT
DRAPE HALF SHEET 40X57 (DRAPES) IMPLANT
DRAPE LAPAROTOMY 100X72X124 (DRAPES) ×2 IMPLANT
DRAPE POUCH INSTRU U-SHP 10X18 (DRAPES) ×4 IMPLANT
DRAPE SURG 17X23 STRL (DRAPES) ×2 IMPLANT
DRSG OPSITE POSTOP 4X6 (GAUZE/BANDAGES/DRESSINGS) ×2 IMPLANT
DURAPREP 26ML APPLICATOR (WOUND CARE) ×2 IMPLANT
ELECT BLADE 4.0 EZ CLEAN MEGAD (MISCELLANEOUS) ×2
ELECT REM PT RETURN 9FT ADLT (ELECTROSURGICAL) ×2
ELECTRODE BLDE 4.0 EZ CLN MEGD (MISCELLANEOUS) ×1 IMPLANT
ELECTRODE REM PT RTRN 9FT ADLT (ELECTROSURGICAL) ×1 IMPLANT
EVACUATOR 1/8 PVC DRAIN (DRAIN) ×2 IMPLANT
EVACUATOR 3/16  PVC DRAIN (DRAIN) ×1
EVACUATOR 3/16 PVC DRAIN (DRAIN) ×1 IMPLANT
GAUZE 4X4 16PLY RFD (DISPOSABLE) IMPLANT
GAUZE SPONGE 4X4 12PLY STRL (GAUZE/BANDAGES/DRESSINGS) ×2 IMPLANT
GLOVE BIO SURGEON STRL SZ7 (GLOVE) IMPLANT
GLOVE BIO SURGEON STRL SZ8 (GLOVE) ×4 IMPLANT
GLOVE BIOGEL PI IND STRL 7.0 (GLOVE) IMPLANT
GLOVE BIOGEL PI INDICATOR 7.0 (GLOVE)
GLOVE ECLIPSE 7.5 STRL STRAW (GLOVE) IMPLANT
GLOVE EXAM NITRILE XL STR (GLOVE) IMPLANT
GLOVE INDICATOR 8.5 STRL (GLOVE) ×4 IMPLANT
GOWN STRL REUS W/ TWL LRG LVL3 (GOWN DISPOSABLE) IMPLANT
GOWN STRL REUS W/ TWL XL LVL3 (GOWN DISPOSABLE) ×2 IMPLANT
GOWN STRL REUS W/TWL 2XL LVL3 (GOWN DISPOSABLE) IMPLANT
GOWN STRL REUS W/TWL LRG LVL3 (GOWN DISPOSABLE)
GOWN STRL REUS W/TWL XL LVL3 (GOWN DISPOSABLE) ×2
KIT BASIN OR (CUSTOM PROCEDURE TRAY) ×2 IMPLANT
KIT INFUSE XX SMALL 0.7CC (Orthopedic Implant) ×2 IMPLANT
KIT TURNOVER KIT B (KITS) ×2 IMPLANT
NEEDLE HYPO 25X1 1.5 SAFETY (NEEDLE) ×2 IMPLANT
NS IRRIG 1000ML POUR BTL (IV SOLUTION) ×2 IMPLANT
OIL CARTRIDGE MAESTRO DRILL (MISCELLANEOUS) ×2
PACK LAMINECTOMY NEURO (CUSTOM PROCEDURE TRAY) ×2 IMPLANT
PAD ARMBOARD 7.5X6 YLW CONV (MISCELLANEOUS) ×6 IMPLANT
ROD 40MM SPINAL (Rod) ×4 IMPLANT
SHAFT CREO 30MM (Neuro Prosthesis/Implant) ×8 IMPLANT
SPACER ALTERA 10X31-15 (Spacer) ×2 IMPLANT
SPONGE LAP 4X18 RFD (DISPOSABLE) IMPLANT
SPONGE SURGIFOAM ABS GEL 100 (HEMOSTASIS) ×2 IMPLANT
STRIP CLOSURE SKIN 1/2X4 (GAUZE/BANDAGES/DRESSINGS) ×2 IMPLANT
SUT VIC AB 0 CT1 18XCR BRD8 (SUTURE) ×2 IMPLANT
SUT VIC AB 0 CT1 8-18 (SUTURE) ×2
SUT VIC AB 2-0 CT1 18 (SUTURE) ×2 IMPLANT
SUT VIC AB 4-0 PS2 27 (SUTURE) ×2 IMPLANT
TOWEL GREEN STERILE (TOWEL DISPOSABLE) ×2 IMPLANT
TOWEL GREEN STERILE FF (TOWEL DISPOSABLE) ×2 IMPLANT
TRAY FOLEY MTR SLVR 16FR STAT (SET/KITS/TRAYS/PACK) ×2 IMPLANT
TULIP CREP AMP 5.5MM (Orthopedic Implant) ×8 IMPLANT
WATER STERILE IRR 1000ML POUR (IV SOLUTION) ×2 IMPLANT

## 2019-06-12 NOTE — H&P (Signed)
[name] is an 63 y.o. male.   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  Past Medical History:  Diagnosis Date  . Allergy   . Arthritis   . Complication of anesthesia    "Woke up with knee surgery"  . History of kidney stones    passed 1  . HOH (hard of hearing)    right ear  . Hyperlipidemia   . Hypertension   . Hypokalemia   . Joint pain     Past Surgical History:  Procedure Laterality Date  . BACK SURGERY  2018  . COLONOSCOPY    . JOINT REPLACEMENT Left     x2  . LITHOTRIPSY    . [place] SURGERY  47/14/2020  . Lumbar disectomy  01/18/2019    Family History  Problem Relation Age of Onset  . Cancer Mother   . Cancer Father   . Cancer Brother   . Hyperlipidemia Brother    Social History:  reports that he has never smoked. He quit smokeless tobacco use about 5 months ago.  His smokeless tobacco use included chew. He reports current alcohol use. He reports that he does not use drugs.  Allergies:  Allergies  Allergen Reactions  . Mobic [Meloxicam] Palpitations    Medications Prior to Admission  Medication Sig Dispense Refill  . aspirin 325 MG tablet Take 325 mg by mouth every evening.     . celecoxib (CELEBREX) 200 MG capsule Take 1 capsule by  mouth once daily (Patient taking differently: Take 200 mg by mouth every evening. ) 90 capsule 0  . ciprofloxacin (CIPRO) 500 MG tablet Take 1 tablet (500 mg total) by mouth every 12 (twelve) hours. 28 tablet 0  . diltiazem (CARDIZEM CD) 120 MG 24 hr capsule Take 1 capsule (120 mg total) by mouth daily. (Patient taking differently: Take 120 mg by mouth daily. Dr Nehemiah Massed) 30 capsule 0  . Docusate Sodium (COLACE PO) Take 1 capsule by mouth daily.    . fluorouracil (EFUDEX) 5 % cream Apply 1 application topically daily as needed (precancerous spots).     . fluticasone (FLONASE) 50 MCG/ACT nasal spray Use 1 spray(s) in each nostril once daily (Patient taking differently: Place 1 spray into both nostrils daily as needed for allergies. ) 16 g 0  . lisinopril (PRINIVIL,ZESTRIL) 20 MG tablet Take 20 mg by mouth every evening. Dr Nehemiah Massed    . montelukast (SINGULAIR) 10 MG tablet Take 1 tablet by mouth once daily (Patient taking differently: Take 10 mg by mouth daily. ) 30 tablet 1  . Multiple Vitamins-Minerals (MULTIVITAMIN WITH MINERALS) tablet Take 1 tablet by mouth daily.    . pantoprazole (PROTONIX) 40 MG tablet Take 1 tablet by mouth once daily (Patient taking differently: Take 40 mg by mouth every evening. ) 30 tablet 1  . potassium chloride SA (K-DUR) 20 MEQ tablet Take 3 tablets by mouth once  daily (Patient taking differently: Take 20-40 mEq by mouth See admin instructions. Take 20 meq in the morning and 40 meq in the evening) 270 tablet 0  . spironolactone (ALDACTONE) 25 MG tablet Take 25 mg by mouth daily.    . tamsulosin (FLOMAX) 0.4 MG CAPS capsule Take 1 capsule (0.4 mg total) by mouth daily. 90 capsule 3  . fexofenadine (ALLEGRA) 180 MG tablet Take 1 tablet (180 mg total) by mouth daily. (Patient taking differently: Take 180 mg by mouth daily as needed for allergies. ) 30 tablet 8  . sulfamethoxazole-trimethoprim (BACTRIM DS) 800-160 MG tablet Take 1 tablet by mouth every 12 (twelve) hours.  (Patient not taking: Reported on 06/04/2019) 14 tablet 0    No results found for this or any previous visit (from the past 48 hour(s)). No results found.  Review of Systems  Musculoskeletal: Positive for back pain.  Neurological: Positive for tingling and sensory change.    Blood pressure 133/83, pulse 81, temperature 98.2 F (36.8 C), temperature source Oral, resp. rate 20, height 6\' 1"  (1.854 m), weight 108.9 kg, SpO2 97 %. Physical Exam  Constitutional: He is oriented to person, place, and time. He appears well-developed.  HENT:  Head: Normocephalic.  Eyes: Pupils are equal, round, and reactive to light.  Neck: Normal range of motion.  Cardiovascular: Normal rate.  Respiratory: Effort normal.  GI: Soft. Bowel sounds are normal.  Musculoskeletal: Normal range of motion.  Neurological: He is alert and oriented to person, place, and time. He has normal strength. GCS eye subscore is 4. GCS verbal subscore is 5. GCS motor subscore is 6.  Strength 5 out of 5 iliopsoas, quads, hamstrings, gastrocs, into tibialis, EHL on the right is weak at 4 out of 5 left is 5 out of 5  Skin: Skin is warm and dry.     Assessment/Plan 63 years old presents for decompression stabilization procedure at L4-5  Corvin Sorbo P, MD 06/12/2019, 1:48 PM

## 2019-06-12 NOTE — Transfer of Care (Signed)
Immediate Anesthesia Transfer of Care Note  Patient: [name]  Procedure(s) Performed: LUMBAR FOUR-FIVE TRANSFORAMINAL LUMBAR INTERBODY FUSION (TLIF) WITH PEDICLE SCREW FIXATION (N/A Spine Lumbar)  Patient Location: PACU  Anesthesia Type:Regional  Level of Consciousness: awake, alert , oriented and patient cooperative  Airway & Oxygen Therapy: Patient Spontanous Breathing and Patient connected to nasal cannula oxygen  Post-op Assessment: Report given to RN and Post -op Vital signs reviewed and stable  Post vital signs: Reviewed and stable  Last Vitals:  Vitals Value Taken Time  BP 119/94 06/12/19 1720  Temp 36.8 C 06/12/19 1720  Pulse 90 06/12/19 1722  Resp 13 06/12/19 1722  SpO2 95 % 06/12/19 1722  Vitals shown include unvalidated device data.  Last Pain:  Vitals:   06/12/19 1720  TempSrc:   PainSc: 7       Patients Stated Pain Goal: 2 (16/10/96 0454)  Complications: No apparent anesthesia complications

## 2019-06-12 NOTE — Anesthesia Procedure Notes (Signed)
Procedure Name: Intubation Date/Time: 06/12/2019 2:21 PM Performed by: Julieta Bellini, CRNA Pre-anesthesia Checklist: Patient identified, Emergency Drugs available, Suction available and Patient being monitored Patient Re-evaluated:Patient Re-evaluated prior to induction Oxygen Delivery Method: Circle system utilized Preoxygenation: Pre-oxygenation with 100% oxygen Induction Type: IV induction Ventilation: Mask ventilation without difficulty Laryngoscope Size: Mac and 4 Grade View: Grade I Tube type: Oral Tube size: 7.5 mm Number of attempts: 1 Airway Equipment and Method: Stylet Placement Confirmation: ETT inserted through vocal cords under direct vision,  positive ETCO2 and breath sounds checked- equal and bilateral Secured at: 23 cm Tube secured with: Tape Dental Injury: Teeth and Oropharynx as per pre-operative assessment

## 2019-06-12 NOTE — Op Note (Signed)
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  2.  Transforaminal interbody fusion L4-5 utilizing the globus Altera expandable cage system packed with locally harvested autograft mixed with vivigen and BMP  3.  Cortical screw fixation utilizing the globus Creo amp modular cortical screw set  4.  Open reduction spinal deformity  Surgeon: Dominica Severin Lucilla Petrenko  Assistant: Nash Shearer  Anesthesia: General  EBL: Minimal  HPI: Patient is a very pleasant 62 year old gentleman previously undergone laminectomy microdiscectomy at L4-5 on the right and initially did fairly well however the last several weeks months is a progression worsening back and recurrent right leg pain is been refractory to all forms of conservative treatment with anti-inflammatories physical therapy and epidural steroid injections.  Repeat imaging showed recurrent disc herniation severe degenerative collapse and instability at L4-5 on the right.  Due to this I recommended decompression redo discectomy and fusion at L4-5 extensive 1 of the risks and benefits of the operation with him as well as perioperative course expectations of outcome and alternatives of surgery and he understood and agreed to proceed forward.  Operative procedure: Patient brought into the OR was used in general anesthesia positioned prone on the Wilson frame his back was prepped and draped in routine sterile fashion.  His old incision was opened up and extended slightly cephalad caudally after infiltration of 10 cc lidocaine with epi the scar tissue was dissected free and subperiosteal dissection was carried out lamina of L4 and L5  bilaterally exposing the cortical screw entry points at L4 and L5.  There is extensive scar tissue this was teased off of the residual of the lamina of L4 and the superior aspect lamina of L5.  I remove the spinous process at L4 and began a central and leftward decompression due to complete medial facetectomy and radical foraminotomies of the left side of L4 and L5 nerve root.  Then working underneath the scar tissue dissecting the scar tissue off of the dura I ellipticized around where he had a previous spinal fluid leak by first tracking out the L4 nerve root performing a complete facetectomy and dissecting the L5 nerve root off of the pedicle and marching superiorly to the undersurface of the forward.  There was a large recurrent disc underneath the L4 nerve root at the level of the space this was all removed.  Then this case was cleaned out bilaterally and utilizing sequential distractors with 11 distractor in place prepared and performed a complete central lateral discectomy prepared all the endplates I sized up a 93-81 expandable Altera cage packed with locally harvested autograft mixed and inserted across the midline and kicked it into position and expanded.  I remove the distractor immediately prior to passing the implant across the midline.  Postop fluoroscopy confirmed good positioning implant then placed all 4 cortical screws in routine sterile fashion with 30 mm cortical screws.  Then assembled the heads placed the screws further and after fluoroscopy confirmed good position of the implants.  I then put 40 rods in place anchored down at L5 compressed the L4 screw against L5 inspected the foramina to confirm patency and decompression.  Leg Gelfoam in the dura and then injected Exparel in the fascia placed a medium Hemovac drain and  closed the wound in layers with interrupted Vicryl in a running 4 subcuticular Dermabond benzoin Steri-Strips and a sterile dressing was applied patient recovery room in stable  condition.  At the end the case all needle count sponge counts were correct.

## 2019-06-13 ENCOUNTER — Encounter (HOSPITAL_COMMUNITY): Payer: Self-pay | Admitting: Neurosurgery

## 2019-06-13 MED ORDER — OXYCODONE-ACETAMINOPHEN 5-325 MG PO TABS: 1.0000 | ORAL_TABLET | Freq: Four times a day (QID) | ORAL | 0 refills | Status: DC | PRN

## 2019-06-13 MED ORDER — CYCLOBENZAPRINE HCL 10 MG PO TABS: 10.0000 mg | ORAL_TABLET | Freq: Three times a day (TID) | ORAL | 0 refills | Status: DC | PRN

## 2019-06-13 NOTE — Evaluation (Signed)
Physical Therapy Evaluation Patient Details Name: ARVIND MEXICANO MRN: [id number] DOB: 1955-11-25 Today's Date: 06/13/2019   History of Present Illness  Pt is a 63 year old male presenting s/p TLIF w/ pedicle screw fixation at L4-L5. Relevant PMH includes arthritis, HLD, HTN, and hypokalemia. PSH of extraforaminal and intraforaminal discectomy/laminectomy in March 2020.  Clinical Impression  Pt presents with functional mobility deficits following TLIF including requiring increased time to perform tasks and decreased standing balance. Performed mobility tasks at the min G level. Pt did require extra steadying time for initial sit to stand as pt demonstrated sway in posture. Provided pt education on spinal precautions, car transfers, and exercise program. Pt will benefit from skilled acute physical therapy to promote functional independence and safety upon discharge home with wife.    Follow Up Recommendations No PT follow up    Equipment Recommendations  None recommended by PT    Recommendations for Other Services       Precautions / Restrictions Precautions Precautions: Back Precaution Booklet Issued: Yes (comment) Required Braces or Orthoses: Spinal Brace Spinal Brace: Lumbar corset;Applied in sitting position Restrictions Weight Bearing Restrictions: No      Mobility  Bed Mobility Overal bed mobility: Needs Assistance Bed Mobility: Sidelying to Sit;Rolling;Sit to Sidelying Rolling: Min guard Sidelying to sit: Min guard Supine to sit: Min guard   Sit to sidelying: Min guard General bed mobility comments: Pt requires increased time to complete bed mobility. Pt demonstrates understanding of log roll technique.  Transfers Overall transfer level: Needs assistance   Transfers: Sit to/from Stand Sit to Stand: Min guard         General transfer comment: Pt requires increased time to complete sit to stand transfer. Pt requires two attempts to reach standing, to steady self  prior to ambulation.  Ambulation/Gait Ambulation/Gait assistance: Min guard Gait Distance (Feet): 200 Feet   Gait Pattern/deviations: Step-through pattern;Decreased step length - right;Decreased step length - left Gait velocity: decreased Gait velocity interpretation: 1.31 - 2.62 ft/sec, indicative of limited community ambulator General Gait Details: Pt ambulates with a step through pattern and demonstrates B decreased step length. Pt requires VCs for upright posture.  Stairs Stairs: Yes Stairs assistance: Min guard Stair Management: One rail Left;Alternating pattern;Step to pattern Number of Stairs: 10 General stair comments: Pt completes stairs with min G assist. Pt requires VCs to descend stairs one at a time.  Wheelchair Mobility    Modified Rankin (Stroke Patients Only)       Balance Overall balance assessment: Needs assistance Sitting-balance support: No upper extremity supported;Feet supported Sitting balance-Leahy Scale: Fair     Standing balance support: No upper extremity supported Standing balance-Leahy Scale: Fair Standing balance comment: Pt demonstrates unsteadiness initally with standing, however, once he gains his balance he is able to maintain it.                             Pertinent Vitals/Pain Pain Assessment: Faces Faces Pain Scale: Hurts a little bit Pain Location: low back Pain Descriptors / Indicators: Grimacing Pain Intervention(s): Monitored during session    Home Living Family/patient expects to be discharged to:: Private residence Living Arrangements: Spouse/significant other Available Help at Discharge: Family;Available 24 hours/day Type of Home: House Home Access: Stairs to enter Entrance Stairs-Rails: Left Entrance Stairs-Number of Steps: 5 Home Layout: Two level Home Equipment: Walker - 2 wheels;Cane - single point;Shower seat Additional Comments: Pt must use 2nd level to reach bedroom.  Prior Function Level of  Independence: Independent with assistive device(s)         Comments: Pt was ambulating independently prior to surgery, however, used walking stick for steadying during gait.     Hand Dominance        Extremity/Trunk Assessment   Upper Extremity Assessment Upper Extremity Assessment: Overall WFL for tasks assessed    Lower Extremity Assessment Lower Extremity Assessment: Overall WFL for tasks assessed    Cervical / Trunk Assessment Cervical / Trunk Assessment: Normal  Communication   Communication: HOH  Cognition Arousal/Alertness: Awake/alert Behavior During Therapy: WFL for tasks assessed/performed Overall Cognitive Status: Within Functional Limits for tasks assessed                                        General Comments General comments (skin integrity, edema, etc.): Pt has a surgical drain in low back.    Exercises     Assessment/Plan    PT Assessment Patient needs continued PT services  PT Problem List Decreased strength;Decreased range of motion;Decreased activity tolerance;Decreased balance;Decreased mobility;Decreased knowledge of use of DME;Decreased knowledge of precautions       PT Treatment Interventions DME instruction;Gait training;Stair training;Functional mobility training;Therapeutic activities;Therapeutic exercise;Balance training;Patient/family education    PT Goals (Current goals can be found in the Care Plan section)  Acute Rehab PT Goals Patient Stated Goal: walk without pain PT Goal Formulation: With patient Time For Goal Achievement: 06/27/19 Potential to Achieve Goals: Good    Frequency Min 5X/week   Barriers to discharge        Co-evaluation               AM-PAC PT "6 Clicks" Mobility  Outcome Measure Help needed turning from your back to your side while in a flat bed without using bedrails?: A Little Help needed moving from lying on your back to sitting on the side of a flat bed without using bedrails?:  A Little Help needed moving to and from a bed to a chair (including a wheelchair)?: A Little Help needed standing up from a chair using your arms (e.g., wheelchair or bedside chair)?: A Little Help needed to walk in hospital room?: A Little Help needed climbing 3-5 steps with a railing? : A Little 6 Click Score: 18    End of Session Equipment Utilized During Treatment: Gait belt Activity Tolerance: Patient tolerated treatment well Patient left: in bed;with call bell/phone within reach Nurse Communication: Mobility status PT Visit Diagnosis: Unsteadiness on feet (R26.81);Muscle weakness (generalized) (M62.81)    Time: 1610-9604 PT Time Calculation (min) (ACUTE ONLY): 24 min   Charges:   PT Evaluation $PT Eval Moderate Complexity: 1 Mod PT Treatments $Gait Training: 8-22 mins        Christophe Louis, SPT  Jazzmin Newbold 06/13/2019, 10:27 AM

## 2019-06-13 NOTE — Progress Notes (Signed)
Orthopedic Tech Progress Note Patient Details:  [name] 12-Mar-1956 [id number] Patient has brace was supplied to before surgery Patient ID: [name], male   DOB: [birth year], 63 y.o.   MRN: [id number]   Janit Pagan 06/13/2019, 7:04 AM

## 2019-06-13 NOTE — Discharge Summary (Signed)
Physician Discharge Summary  Patient ID: [name] MRN: [id number] DOB/AGE: 1956-08-31 63 y.o.  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 The patient tolerated the procedure well and was taken to the recovery room and then to the floor in stable condition. The hospital course was routine. There were no complications. The wound remained clean dry and intact. Pt had appropriate back soreness. No complaints of leg pain or new N/T/W. The patient remained afebrile with stable vital signs, and tolerated a regular diet. The patient continued to increase activities, and pain was well controlled with oral pain medications.   Consults: None  Significant Diagnostic Studies:  Results for orders placed or performed during the hospital encounter of 06/10/19  SARS CORONAVIRUS 2 Nasal Swab Aptima Multi Swab   Specimen: Aptima Multi Swab; Nasal Swab  Result Value Ref Range   SARS Coronavirus 2 NEGATIVE NEGATIVE    Dg Lumbar Spine 2-3 Views  Result Date: 06/12/2019 CLINICAL DATA:  Elective lumbar spine surgery EXAM: DG C-ARM 61-120 MIN; LUMBAR SPINE - 2-3 VIEW COMPARISON:  06/05/2019 lumbar spine CT. FINDINGS: Fluoroscopy time 0 minutes 37 seconds. Spot fluoroscopic nondiagnostic intraoperative radiographs of the lower lumbar spine demonstrate postsurgical changes from bilateral posterior spinal fusion at L4-5 with bilateral L4 and L5 pedicle screws and bone cage in the L4-5 disc space. IMPRESSION: Intraoperative fluoroscopic guidance for elective lumbar spine surgery. Electronically Signed   By: Ilona Sorrel M.D.   On: 06/12/2019 17:07   Ct Lumbar Spine Wo Contrast  Result Date: 06/06/2019 CLINICAL DATA:   64 year old male status post lumbar surgery earlier this year. Continued low back pain. EXAM: CT LUMBAR SPINE WITHOUT CONTRAST TECHNIQUE: Multidetector CT imaging of the lumbar spine was performed without intravenous contrast administration. Multiplanar CT image reconstructions were also generated. COMPARISON:  Lumbar MRI 02/04/2019 and earlier. FINDINGS: Segmentation: Normal, the same numbering system used on the April MRI. Alignment: Stable. Relatively preserved lumbar lordosis with mild multilevel retrolisthesis, including L2-L3 and L5-S1. mild dextroconvex lumbar scoliosis. Vertebrae: Postoperative changes to the right lamina at L4-L5. No acute osseous abnormality identified. Intact visible sacrum and SI joints. Paraspinal and other soft tissues: Postoperative changes to the paraspinal soft tissues. Mild Calcified aortic atherosclerosis. Negative visible noncontrast abdominal viscera. Disc levels: There is a degree of congenital spinal canal narrowing related to short pedicle distance from the visible thoracic through the lumbar spine. This also affects the bilateral neural foramina. The following superimposed degenerative changes are noted: T11-T12: Negative. T12-L1:  Negative. L1-L2:  Mild disc bulge. L2-L3: Moderate circumferential disc bulge. Mild to moderate overall spinal stenosis and moderate left L2 foraminal stenosis appears stable from the April MRI. L3-L4: Vacuum disc and disc space loss. Bulky circumferential disc bulge with endplate spurring. Moderate spinal, left lateral recess, and bilateral L3 foraminal stenosis appears stable. L4-L5: Vacuum disc and disc space loss. Bulky circumferential disc bulge. There is a small focus of gas in the ventral canal situated between the midline and the right lateral recess on series 3, image 96. Sagittal images suggest a right paracentral disc extrusion associated with the gas (sagittal image 45). There is also a small amount of gas in the right neural foramen  on series 3, image 92. Previous right laminectomy with moderate posterior  element hypertrophy. Overall moderate spinal stenosis and severe right L4 foraminal stenosis. Moderate left foraminal stenosis appears stable. L5-S1: Disc space loss and vacuum disc. Circumferential disc bulge and endplate spurring. Mild to moderate posterior element hypertrophy. Similar effaced bilateral lateral recesses without significant spinal stenosis. Moderate bilateral L5 foraminal stenosis is stable. IMPRESSION: 1. 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 Multilevel superimposed advanced lumbar disc degeneration and mild retrolisthesis. 2. Postoperative changes on the right at L4-L5 where small right paracentral and right foraminal foci of gas suggest persistent or recurrent disc extrusion. Suspected moderate spinal and moderate to severe right lateral recess and right foraminal stenosis. 3. Other lumbar levels appear stable from the April MRI, including mild to moderate multifactorial spinal stenosis at L2-L3 and L3-L4. Electronically Signed   By: Genevie Ann M.D.   On: 06/06/2019 00:13   Dg C-arm 1-60 Min  Result Date: 06/12/2019 CLINICAL DATA:  Elective lumbar spine surgery EXAM: DG C-ARM 61-120 MIN; LUMBAR SPINE - 2-3 VIEW COMPARISON:  06/05/2019 lumbar spine CT. FINDINGS: Fluoroscopy time 0 minutes 37 seconds. Spot fluoroscopic nondiagnostic intraoperative radiographs of the lower lumbar spine demonstrate postsurgical changes from bilateral posterior spinal fusion at L4-5 with bilateral L4 and L5 pedicle screws and bone cage in the L4-5 disc space. IMPRESSION: Intraoperative fluoroscopic guidance for elective lumbar spine surgery. Electronically Signed   By: Ilona Sorrel M.D.   On: 06/12/2019 17:07   Ct Renal Stone Study  Result Date: 06/03/2019 CLINICAL DATA:  Recurrent urinary tract infections EXAM: CT ABDOMEN AND PELVIS WITHOUT CONTRAST TECHNIQUE: Multidetector CT imaging of the abdomen and pelvis  was performed following the standard protocol without IV contrast. COMPARISON:  None FINDINGS: Lower chest: Lung bases are clear. Hepatobiliary: Small cysts in the RIGHT hepatic lobe. No biliary duct dilatation. Gallbladder normal. Pancreas: Pancreas is normal. No ductal dilatation. No pancreatic inflammation. Spleen: Normal spleen Adrenals/urinary tract: Adrenal glands normal. No nephrolithiasis or ureterolithiasis. No bladder calculi. No obstructive uropathy Stomach/Bowel: Stomach, small bowel, appendix, and cecum are normal. The colon and rectosigmoid colon are normal. Vascular/Lymphatic: Abdominal aorta is normal caliber. Mild intimal calcification of the aorta. No periportal or retroperitoneal adenopathy. No pelvic adenopathy. Reproductive: Prostate normal Other: No free fluid. Musculoskeletal: No aggressive osseous lesion. IMPRESSION: 1. No nephrolithiasis, ureterolithiasis or obstructive uropathy. 2. No bladder calculi. 3.  Aortic Atherosclerosis (ICD10-I70.0). Electronically Signed   By: Suzy Bouchard M.D.   On: 06/03/2019 14:11    Antibiotics:  Anti-infectives (From admission, onward)   Start     Dose/Rate Route Frequency Ordered Stop   06/12/19 2200  ciprofloxacin (CIPRO) tablet 500 mg     500 mg Oral Every 12 hours 06/12/19 1822     06/12/19 1830  ceFAZolin (ANCEF) IVPB 2g/100 mL premix     2 g 200 mL/hr over 30 Minutes Intravenous Every 8 hours 06/12/19 1822 06/13/19 0400   06/12/19 1448  bacitracin 50,000 Units in sodium chloride 0.9 % 500 mL irrigation  Status:  Discontinued       As needed 06/12/19 1448 06/12/19 1715   06/12/19 1145  ceFAZolin (ANCEF) IVPB 2g/100 mL premix     2 g 200 mL/hr over 30 Minutes Intravenous On call to O.R. 06/12/19 1134 06/12/19 1421      Discharge Exam: Blood pressure 108/72, pulse 80, temperature 97.7 F (36.5 C), temperature source Oral, resp. rate 18, height 6\' 1"  (1.854 m), weight 108.9 kg, SpO2 99 %. Neurologic: Grossly normal Ambulating and  voiding well, incision cdi  Discharge Medications:   Allergies as of 06/13/2019      Reactions   Mobic [meloxicam] Palpitations      Medication List    TAKE these medications   aspirin 325 MG tablet Take 325 mg by mouth every evening.   celecoxib 200 MG capsule Commonly known as: CELEBREX Take 1 capsule by mouth once daily What changed: when to take this   ciprofloxacin 500 MG tablet Commonly known as: CIPRO Take 1 tablet (500 mg total) by mouth every 12 (twelve) hours.   COLACE PO Take 1 capsule by mouth daily.   cyclobenzaprine 10 MG tablet Commonly known as: FLEXERIL Take 1 tablet (10 mg total) by mouth 3 (three) times daily as needed for muscle spasms.   diltiazem 120 MG 24 hr capsule Commonly known as: Cardizem CD Take 1 capsule (120 mg total) by mouth daily. What changed: additional instructions   fexofenadine 180 MG tablet Commonly known as: ALLEGRA Take 1 tablet (180 mg total) by mouth daily. What changed:   when to take this  reasons to take this   fluorouracil 5 % cream Commonly known as: EFUDEX Apply 1 application topically daily as needed (precancerous spots).   fluticasone 50 MCG/ACT nasal spray Commonly known as: FLONASE Use 1 spray(s) in each nostril once daily What changed: See the new instructions.   lisinopril 20 MG tablet Commonly known as: ZESTRIL Take 20 mg by mouth every evening. Dr Nehemiah Massed   montelukast 10 MG tablet Commonly known as: SINGULAIR Take 1 tablet by mouth once daily   multivitamin with minerals tablet Take 1 tablet by mouth daily.   oxyCODONE-acetaminophen 5-325 MG tablet Commonly known as: Percocet Take 1-2 tablets by mouth every 6 (six) hours as needed for severe pain.   pantoprazole 40 MG tablet Commonly known as: PROTONIX Take 1 tablet by mouth once daily What changed: when to take this   potassium chloride SA 20 MEQ tablet Commonly known as: K-DUR Take 3 tablets by mouth once daily What changed:    how much to take  when to take this  additional instructions   spironolactone 25 MG tablet Commonly known as: ALDACTONE Take 25 mg by mouth daily.   sulfamethoxazole-trimethoprim 800-160 MG tablet Commonly known as: BACTRIM DS Take 1 tablet by mouth every 12 (twelve) hours.   tamsulosin 0.4 MG Caps capsule Commonly known as: FLOMAX Take 1 capsule (0.4 mg total) by mouth daily.       Disposition: home   Final Dx: PLI fL4-5  Discharge Instructions     Remove dressing in 72 hours   Complete by: As directed    Call MD for:  difficulty breathing, headache or visual disturbances   Complete by: As directed    Call MD for:  hives   Complete by: As directed    Call MD for:  persistant nausea and vomiting   Complete by: As directed    Call MD for:  redness, tenderness, or signs of infection (pain, swelling, redness, odor or green/yellow discharge around incision site)   Complete by: As directed    Call MD for:  severe uncontrolled pain   Complete by: As directed    Call MD for:  temperature >100.4   Complete by: As directed    Diet - low sodium heart healthy   Complete by: As directed    Driving Restrictions   Complete by: As directed    No driving for 2 weeks, no riding in the car for 1 week  Increase activity slowly   Complete by: As directed    Lifting restrictions   Complete by: As directed    No lifting more than 8 lbs\         Signed: Ocie Cornfield Shereese Bonnie 06/13/2019, 7:49 AM

## 2019-06-13 NOTE — Progress Notes (Signed)
Patient is discharged from room 3C03 at this time. Alert and in stable condition. IV site d/c'd and instructions read to patient with understanding verbalized. Left unit via wheelchair with all belongings at side. 

## 2019-06-13 NOTE — Discharge Instructions (Signed)

## 2019-06-13 NOTE — Anesthesia Postprocedure Evaluation (Signed)
Anesthesia Post Note  Patient: LAVARIS SEXSON  Procedure(s) Performed: LUMBAR FOUR-FIVE TRANSFORAMINAL LUMBAR INTERBODY FUSION (TLIF) WITH PEDICLE SCREW FIXATION (N/A Spine Lumbar)     Patient location during evaluation: PACU Anesthesia Type: General Level of consciousness: awake and alert Pain management: pain level controlled Vital Signs Assessment: post-procedure vital signs reviewed and stable Respiratory status: spontaneous breathing, nonlabored ventilation, respiratory function stable and patient connected to nasal cannula oxygen Cardiovascular status: blood pressure returned to baseline and stable Postop Assessment: no apparent nausea or vomiting Anesthetic complications: no    Last Vitals:  Vitals:   06/13/19 0401 06/13/19 0720  BP: 116/79 108/72  Pulse: 88 80  Resp: 20 18  Temp: 36.7 C 36.5 C  SpO2: 96% 99%    Last Pain:  Vitals:   06/13/19 1046  TempSrc:   PainSc: 5                  Takako Minckler DAVID

## 2019-06-22 ENCOUNTER — Other Ambulatory Visit: Payer: Self-pay | Admitting: Family Medicine

## 2019-06-22 DIAGNOSIS — J302 Other seasonal allergic rhinitis: Secondary | ICD-10-CM

## 2019-06-25 DIAGNOSIS — M5126 Other intervertebral disc displacement, lumbar region: Secondary | ICD-10-CM | POA: Insufficient documentation

## 2019-07-04 ENCOUNTER — Other Ambulatory Visit: Payer: Self-pay | Admitting: Family Medicine

## 2019-07-04 DIAGNOSIS — E876 Hypokalemia: Secondary | ICD-10-CM

## 2019-07-08 ENCOUNTER — Other Ambulatory Visit
Admission: RE | Admit: 2019-07-08 | Discharge: 2019-07-08 | Disposition: A | Discharge status: Home / Self Care | Payer: Medicare Other | Attending: Urology | Admitting: Urology

## 2019-07-08 ENCOUNTER — Other Ambulatory Visit: Payer: Self-pay | Admitting: Family Medicine

## 2019-07-08 ENCOUNTER — Other Ambulatory Visit: Payer: Self-pay

## 2019-07-08 ENCOUNTER — Other Ambulatory Visit: Payer: Medicare Other

## 2019-07-08 DIAGNOSIS — R972 Elevated prostate specific antigen [PSA]: Secondary | ICD-10-CM | POA: Diagnosis present

## 2019-07-08 LAB — PSA: Prostatic Specific Antigen: 7.09 ng/mL — ABNORMAL HIGH (ref 0.00–4.00)

## 2019-07-09 ENCOUNTER — Other Ambulatory Visit: Payer: Self-pay | Admitting: Family Medicine

## 2019-07-09 DIAGNOSIS — M1712 Unilateral primary osteoarthritis, left knee: Secondary | ICD-10-CM

## 2019-07-09 DIAGNOSIS — J302 Other seasonal allergic rhinitis: Secondary | ICD-10-CM

## 2019-07-09 DIAGNOSIS — M5136 Other intervertebral disc degeneration, lumbar region: Secondary | ICD-10-CM

## 2019-07-10 ENCOUNTER — Other Ambulatory Visit: Payer: Self-pay | Admitting: Urology

## 2019-07-10 DIAGNOSIS — R972 Elevated prostate specific antigen [PSA]: Secondary | ICD-10-CM

## 2019-07-10 NOTE — Progress Notes (Unsigned)
I have placed an order for [name] to have a prostate MRI for elevated PSA.

## 2019-07-10 NOTE — Telephone Encounter (Signed)
They should be calling him this week to schedule

## 2019-07-23 ENCOUNTER — Ambulatory Visit: Payer: Medicare Other

## 2019-07-25 ENCOUNTER — Other Ambulatory Visit: Payer: Self-pay | Admitting: Family Medicine

## 2019-07-25 ENCOUNTER — Ambulatory Visit
Admission: RE | Admit: 2019-07-25 | Discharge: 2019-07-25 | Disposition: A | Discharge status: Home / Self Care | Payer: Medicare Other | Source: Ambulatory Visit | Attending: Urology | Admitting: Urology

## 2019-07-25 ENCOUNTER — Other Ambulatory Visit: Payer: Self-pay | Admitting: Urology

## 2019-07-25 ENCOUNTER — Other Ambulatory Visit: Payer: Self-pay

## 2019-07-25 DIAGNOSIS — R972 Elevated prostate specific antigen [PSA]: Secondary | ICD-10-CM

## 2019-07-25 LAB — POCT I-STAT CREATININE: Creatinine, Ser: 1 mg/dL (ref 0.61–1.24)

## 2019-07-25 MED ORDER — GADOBUTROL 1 MMOL/ML IV SOLN
10.0000 mL | Freq: Once | INTRAVENOUS | Status: AC | PRN
  Administered 2019-07-25: 09:00:00 10 mL via INTRAVENOUS

## 2019-07-25 NOTE — Progress Notes (Unsigned)
I have placed an referral for a fusion biopsy.

## 2019-08-07 ENCOUNTER — Encounter: Payer: Self-pay | Admitting: Family Medicine

## 2019-08-07 ENCOUNTER — Other Ambulatory Visit: Payer: Self-pay

## 2019-08-07 ENCOUNTER — Ambulatory Visit: Payer: Medicare Other | Admitting: Family Medicine

## 2019-08-07 VITALS — BP 120/80 | HR 64 | Ht 73.0 in | Wt 246.0 lb

## 2019-08-07 DIAGNOSIS — Z1211 Encounter for screening for malignant neoplasm of colon: Secondary | ICD-10-CM

## 2019-08-07 DIAGNOSIS — E876 Hypokalemia: Secondary | ICD-10-CM | POA: Diagnosis not present

## 2019-08-07 DIAGNOSIS — E7801 Familial hypercholesterolemia: Secondary | ICD-10-CM

## 2019-08-07 DIAGNOSIS — J302 Other seasonal allergic rhinitis: Secondary | ICD-10-CM

## 2019-08-07 DIAGNOSIS — K219 Gastro-esophageal reflux disease without esophagitis: Secondary | ICD-10-CM

## 2019-08-07 DIAGNOSIS — I10 Essential (primary) hypertension: Secondary | ICD-10-CM | POA: Diagnosis not present

## 2019-08-07 DIAGNOSIS — R351 Nocturia: Secondary | ICD-10-CM

## 2019-08-07 DIAGNOSIS — Z23 Encounter for immunization: Secondary | ICD-10-CM

## 2019-08-07 DIAGNOSIS — N401 Enlarged prostate with lower urinary tract symptoms: Secondary | ICD-10-CM

## 2019-08-07 MED ORDER — PANTOPRAZOLE SODIUM 40 MG PO TBEC: 40.0000 mg | DELAYED_RELEASE_TABLET | Freq: Every day | ORAL | 1 refills | Status: DC

## 2019-08-07 MED ORDER — DILTIAZEM HCL ER COATED BEADS 120 MG PO CP24: 120.0000 mg | ORAL_CAPSULE | Freq: Every day | ORAL | 1 refills | Status: DC

## 2019-08-07 MED ORDER — MONTELUKAST SODIUM 10 MG PO TABS: 10.0000 mg | ORAL_TABLET | Freq: Every day | ORAL | 1 refills | Status: DC

## 2019-08-07 MED ORDER — LISINOPRIL 20 MG PO TABS: 20.0000 mg | ORAL_TABLET | Freq: Every evening | ORAL | 1 refills | Status: DC

## 2019-08-07 MED ORDER — FEXOFENADINE HCL 180 MG PO TABS: 180.0000 mg | ORAL_TABLET | Freq: Every day | ORAL | 1 refills | Status: DC

## 2019-08-07 MED ORDER — TAMSULOSIN HCL 0.4 MG PO CAPS: 0.4000 mg | ORAL_CAPSULE | Freq: Every day | ORAL | 3 refills | Status: DC

## 2019-08-07 MED ORDER — FLUTICASONE PROPIONATE 50 MCG/ACT NA SUSP: NASAL | 11 refills | Status: DC

## 2019-08-07 MED ORDER — POTASSIUM CHLORIDE CRYS ER 20 MEQ PO TBCR: 60.0000 meq | EXTENDED_RELEASE_TABLET | Freq: Every day | ORAL | 0 refills | Status: DC

## 2019-08-07 MED ORDER — SPIRONOLACTONE 25 MG PO TABS: 25.0000 mg | ORAL_TABLET | Freq: Every day | ORAL | 1 refills | Status: DC

## 2019-08-07 NOTE — Progress Notes (Signed)
Date:  08/07/2019   Name:  [name]   DOB:  1955/11/29   MRN:  [id number]   Chief Complaint: Hypertension, hypokalemia, Gastroesophageal Reflux, Allergic Rhinitis , Asthma, Benign Prostatic Hypertrophy, and prevnar 13  Hypertension This is a chronic problem. The current episode started more than 1 year ago. The problem is unchanged. The problem is controlled. Pertinent negatives include no anxiety, blurred vision, chest pain, headaches, malaise/fatigue, neck pain, orthopnea, palpitations, peripheral edema, PND, shortness of breath or sweats. There are no associated agents to hypertension. There are no known risk factors for coronary artery disease. Past treatments include calcium channel blockers, diuretics and ACE inhibitors. The current treatment provides moderate improvement. There are no compliance problems.  There is no history of angina, kidney disease, CAD/MI, CVA, heart failure, left ventricular hypertrophy, PVD or retinopathy. There is no history of chronic renal disease, a hypertension causing med or renovascular disease.  Gastroesophageal Reflux He reports no abdominal pain, no belching, no chest pain, no choking, no coughing, no dysphagia, no early satiety, no globus sensation, no heartburn, no hoarse voice, no nausea, no sore throat, no stridor, no tooth decay, no water brash or no wheezing. This is a chronic problem. The current episode started more than 1 year ago. The problem occurs constantly. The problem has been waxing and waning. The symptoms are aggravated by certain foods. Pertinent negatives include no anemia, fatigue, melena, muscle weakness, orthopnea or weight loss. He has tried a PPI for the symptoms. The treatment provided moderate relief.  Asthma There is no chest tightness, cough, difficulty breathing, frequent throat clearing, hemoptysis, hoarse voice, shortness of breath, sputum production or wheezing. This is a chronic problem. The problem occurs  intermittently. Pertinent negatives include no chest pain, ear pain, fever, headaches, heartburn, malaise/fatigue, myalgias, orthopnea, PND, sore throat, sweats or weight loss. His past medical history is significant for asthma.  Benign Prostatic Hypertrophy This is a chronic problem. The current episode started more than 1 year ago. The problem has been waxing and waning since onset. Irritative symptoms do not include frequency, nocturia or urgency. Obstructive symptoms do not include dribbling, incomplete emptying, an intermittent stream, a slower stream, straining or a weak stream. Pertinent negatives include no chills, dysuria, hematuria or nausea. Past treatments include tamsulosin. The treatment provided moderate relief.  Hyperlipidemia This is a chronic problem. The current episode started more than 1 year ago. The problem is controlled. Recent lipid tests were reviewed and are normal. He has no history of chronic renal disease. Factors aggravating his hyperlipidemia include thiazides. Pertinent negatives include no chest pain, myalgias or shortness of breath. Treatments tried: unable tolerate statin.    Review of Systems  Constitutional: Negative for chills, fatigue, fever, malaise/fatigue and weight loss.  HENT: Negative for drooling, ear discharge, ear pain, hoarse voice and sore throat.   Eyes: Negative for blurred vision.  Respiratory: Negative for cough, hemoptysis, sputum production, choking, shortness of breath and wheezing.   Cardiovascular: Negative for chest pain, palpitations, orthopnea, leg swelling and PND.  Gastrointestinal: Negative for abdominal pain, blood in stool, constipation, diarrhea, dysphagia, heartburn, melena and nausea.  Endocrine: Negative for polydipsia.  Genitourinary: Negative for dysuria, frequency, hematuria, incomplete emptying, nocturia and urgency.  Musculoskeletal: Negative for back pain, myalgias, muscle weakness and neck pain.  Skin: Negative for rash.   Allergic/Immunologic: Negative for environmental allergies.  Neurological: Negative for dizziness and headaches.  Hematological: Does not bruise/bleed easily.  Psychiatric/Behavioral: Negative for suicidal ideas. The patient is  not nervous/anxious.     Patient Active Problem List   Diagnosis Date Noted  . HNP (herniated nucleus pulposus), lumbar 06/12/2019  . Biceps tendonitis on right 07/27/2018  . Primary osteoarthritis of right knee 10/12/2017  . Taking multiple medications for chronic disease 07/30/2015  . Bronchitis 07/01/2015  . Reactive airway disease 07/01/2015  . Allergic sinusitis 07/01/2015  . Essential hypertension 07/01/2015  . Valvular heart disease 07/01/2015  . Metatarsalgia of left foot 06/10/2015  . [place] arthritis 12/15/2013    Allergies  Allergen Reactions  . Mobic [Meloxicam] Palpitations    Past Surgical History:  Procedure Laterality Date  . BACK SURGERY  2018  . COLONOSCOPY    . JOINT REPLACEMENT Left     x2  . LITHOTRIPSY    . [place] SURGERY  47/14/2020  . Lumbar disectomy  01/18/2019  . TRANSFORAMINAL LUMBAR INTERBODY FUSION (TLIF) WITH PEDICLE SCREW FIXATION 1 LEVEL N/A 06/12/2019   Procedure: LUMBAR FOUR-FIVE TRANSFORAMINAL LUMBAR INTERBODY FUSION (TLIF) WITH PEDICLE SCREW FIXATION;  Surgeon: Kary Kos, MD;  Location: [facility];  Service: Neurosurgery;  Laterality: N/A;    Social History   Tobacco Use  . Smoking status: Never Smoker  . Smokeless tobacco: Former Systems developer    Types: Chew  Substance Use Topics  . Alcohol use: Yes    Comment: 2 drinks a month  . Drug use: Never     Medication list has been reviewed and updated.  Current Meds  Medication Sig  . aspirin 325 MG tablet Take 325 mg by mouth every evening.   . celecoxib (CELEBREX) 200 MG capsule Take 1 capsule by mouth once daily  . diltiazem (CARDIZEM CD) 120 MG 24 hr capsule Take 1 capsule (120 mg total) by mouth daily. (Patient taking differently: Take 120 mg by mouth daily.  Dr Nehemiah Massed)  . Docusate Sodium (COLACE PO) Take 1 capsule by mouth daily.  . fexofenadine (ALLEGRA) 180 MG tablet Take 1 tablet (180 mg total) by mouth daily. (Patient taking differently: Take 180 mg by mouth daily as needed for allergies. )  . fluorouracil (EFUDEX) 5 % cream Apply 1 application topically daily as needed (precancerous spots).   . fluticasone (FLONASE) 50 MCG/ACT nasal spray Use 1 spray(s) in each nostril once daily (Patient taking differently: Place 1 spray into both nostrils daily as needed for allergies. )  . lisinopril (PRINIVIL,ZESTRIL) 20 MG tablet Take 20 mg by mouth every evening. Dr Nehemiah Massed  . montelukast (SINGULAIR) 10 MG tablet Take 1 tablet by mouth once daily  . Multiple Vitamins-Minerals (MULTIVITAMIN WITH MINERALS) tablet Take 1 tablet by mouth daily.  . pantoprazole (PROTONIX) 40 MG tablet Take 1 tablet by mouth once daily (Patient taking differently: Take 40 mg by mouth every evening. )  . potassium chloride SA (K-DUR) 20 MEQ tablet Take 3 tablets by mouth once daily  . spironolactone (ALDACTONE) 25 MG tablet Take 25 mg by mouth daily.  . tamsulosin (FLOMAX) 0.4 MG CAPS capsule Take 1 capsule (0.4 mg total) by mouth daily.    PHQ 2/9 Scores 12/14/2017 04/08/2016 02/11/2016 06/19/2015  PHQ - 2 Score 0 0 0 0  PHQ- 9 Score 0 - - -    BP Readings from Last 3 Encounters:  08/07/19 120/80  06/13/19 108/72  06/10/19 133/84    Physical Exam Vitals signs and nursing note reviewed.  HENT:     Head: Normocephalic.     Right Ear: Tympanic membrane, ear canal and external ear normal.  Left Ear: Tympanic membrane, ear canal and external ear normal.     Nose: Nose normal.  Eyes:     General: No scleral icterus.       Right eye: No discharge.        Left eye: No discharge.     Conjunctiva/sclera: Conjunctivae normal.     Pupils: Pupils are equal, round, and reactive to light.  Neck:     Musculoskeletal: Normal range of motion and neck supple.     Thyroid: No  thyromegaly.     Vascular: No JVD.     Trachea: No tracheal deviation.  Cardiovascular:     Rate and Rhythm: Normal rate and regular rhythm.     Heart sounds: Normal heart sounds. No murmur. No friction rub. No gallop.   Pulmonary:     Effort: No respiratory distress.     Breath sounds: Normal breath sounds. No wheezing, rhonchi or rales.  Abdominal:     General: Bowel sounds are normal.     Palpations: Abdomen is soft. There is no mass.     Tenderness: There is no abdominal tenderness. There is no guarding or rebound.  Musculoskeletal: Normal range of motion.        General: No tenderness.  Lymphadenopathy:     Cervical: No cervical adenopathy.  Skin:    General: Skin is warm.     Findings: No rash.  Neurological:     Mental Status: He is alert and oriented to person, place, and time.     Cranial Nerves: No cranial nerve deficit.     Deep Tendon Reflexes: Reflexes are normal and symmetric.     Wt Readings from Last 3 Encounters:  08/07/19 246 lb (111.6 kg)  06/12/19 240 lb (108.9 kg)  06/10/19 241 lb 6.4 oz (109.5 kg)    BP 120/80   Pulse 64   Ht 6\' 1"  (1.854 m)   Wt 246 lb (111.6 kg)   BMI 32.46 kg/m   Assessment and Plan: 1. Gastroesophageal reflux disease, unspecified whether esophagitis present Chronic.  Controlled.  Stable.  Continue pantoprazole 40 mg once a day. - pantoprazole (PROTONIX) 40 MG tablet; Take 1 tablet (40 mg total) by mouth daily.  Dispense: 90 tablet; Refill: 1  2. Hypokalemia History of elevated potassium patient is currently on renal lactone for blood pressure control in the low kalemia however has noticed some breast tenderness this is probably contributing ABD to by the decrease in testosterone by the Spironolactone.  We will continue KCl 20 mEq daily - potassium chloride SA (KLOR-CON) 20 MEQ tablet; Take 3 tablets (60 mEq total) by mouth daily.  Dispense: 270 tablet; Refill: 0  3. Other seasonal allergic rhinitis Patient has seasonal  allergies which are controlled on fluticasone nasal spray and fexofenadine 180 mg daily - fluticasone (FLONASE) 50 MCG/ACT nasal spray; Use 1 spray(s) in each nostril once daily  Dispense: 16 g; Refill: 11 - fexofenadine (ALLEGRA) 180 MG tablet; Take 1 tablet (180 mg total) by mouth daily.  Dispense: 90 tablet; Refill: 1  4. Seasonal allergic rhinitis, unspecified trigger As noted above.  Will also include Singulair 10 mg once a day - montelukast (SINGULAIR) 10 MG tablet; Take 1 tablet (10 mg total) by mouth daily.  Dispense: 90 tablet; Refill: 1  5. Benign prostatic hyperplasia with nocturia It was noted on MRI of the prostate that there was some area of concern and this is being further investigated by urology.  In the meantime we will  continue tamsulosin 0.4 mg once a day. - tamsulosin (FLOMAX) 0.4 MG CAPS capsule; Take 1 capsule (0.4 mg total) by mouth daily.  Dispense: 90 capsule; Refill: 3 - PSA  6. Essential hypertension Chronic.  Controlled.  Continue diltiazem CD 120 mg once a day, lisinopril 20 mg once a day, Spironolactone 25 mg once a day.  Will check renal function panel.  Will recheck blood pressure in 6 months - diltiazem (CARDIZEM CD) 120 MG 24 hr capsule; Take 1 capsule (120 mg total) by mouth daily.  Dispense: 90 capsule; Refill: 1 - lisinopril (ZESTRIL) 20 MG tablet; Take 1 tablet (20 mg total) by mouth every evening. Dr Nehemiah Massed  Dispense: 90 tablet; Refill: 1 - spironolactone (ALDACTONE) 25 MG tablet; Take 1 tablet (25 mg total) by mouth daily.  Dispense: 90 tablet; Refill: 1 - Renal Function Panel  7. Familial hypercholesterolemia Patient has had elevated LDL in the past but a reduced risk of coronary artery disease on ratio.  Will check lipid panel fasting today. - Lipid panel  8. Need for vaccination with 13-polyvalent pneumococcal conjugate vaccine Discussed and administered - Pneumococcal conjugate vaccine 13-valent  9. Colon cancer screening Patient needs  referral to gastroenterology for follow-up of colonoscopy.  We will refer to gastroenterology/Dr. Alice Reichert of [facility]. - Ambulatory referral to Gastroenterology

## 2019-08-07 NOTE — Patient Instructions (Signed)

## 2019-08-08 LAB — RENAL FUNCTION PANEL
Albumin: 4.4 g/dL (ref 3.8–4.8)
BUN/Creatinine Ratio: 16 (ref 10–24)
BUN: 17 mg/dL (ref 8–27)
CO2: 21 mmol/L (ref 20–29)
Calcium: 9.1 mg/dL (ref 8.6–10.2)
Chloride: 103 mmol/L (ref 96–106)
Creatinine, Ser: 1.06 mg/dL (ref 0.76–1.27)
GFR calc Af Amer: 85 mL/min/{1.73_m2} (ref >59)
GFR calc non Af Amer: 73 mL/min/{1.73_m2} (ref >59)
Glucose: 127 mg/dL — ABNORMAL HIGH (ref 65–99)
Phosphorus: 3.2 mg/dL (ref 2.8–4.1)
Potassium: 4.8 mmol/L (ref 3.5–5.2)
Sodium: 139 mmol/L (ref 134–144)

## 2019-08-08 LAB — LIPID PANEL
Chol/HDL Ratio: 4.1 ratio (ref 0.0–5.0)
Cholesterol, Total: 215 mg/dL — ABNORMAL HIGH (ref 100–199)
HDL: 53 mg/dL (ref >39)
LDL Chol Calc (NIH): 151 mg/dL — ABNORMAL HIGH (ref 0–99)
Triglycerides: 64 mg/dL (ref 0–149)
VLDL Cholesterol Cal: 11 mg/dL (ref 5–40)

## 2019-08-08 LAB — PSA: Prostate Specific Ag, Serum: 4.5 ng/mL — ABNORMAL HIGH (ref 0.0–4.0)

## 2019-08-12 ENCOUNTER — Other Ambulatory Visit: Payer: Self-pay | Admitting: Family Medicine

## 2019-08-12 DIAGNOSIS — M1712 Unilateral primary osteoarthritis, left knee: Secondary | ICD-10-CM

## 2019-08-12 DIAGNOSIS — M5136 Other intervertebral disc degeneration, lumbar region: Secondary | ICD-10-CM

## 2019-08-12 NOTE — Telephone Encounter (Signed)
[facility] called patient to schedule his [facility] and he wanted to wait until he gets back from being out of town. He will call them when he gets back to schedule   Sharyn Lull

## 2019-08-13 ENCOUNTER — Encounter: Payer: Self-pay | Admitting: Family Medicine

## 2019-08-15 ENCOUNTER — Encounter: Payer: Self-pay | Admitting: Family Medicine

## 2019-09-03 ENCOUNTER — Ambulatory Visit: Payer: Medicare Other | Admitting: Family Medicine

## 2019-09-03 ENCOUNTER — Encounter: Payer: Self-pay | Admitting: Family Medicine

## 2019-09-03 ENCOUNTER — Other Ambulatory Visit: Payer: Self-pay

## 2019-09-03 VITALS — BP 140/80 | HR 80 | Ht 73.0 in | Wt 252.0 lb

## 2019-09-03 DIAGNOSIS — R601 Generalized edema: Secondary | ICD-10-CM | POA: Diagnosis not present

## 2019-09-03 DIAGNOSIS — I38 Endocarditis, valve unspecified: Secondary | ICD-10-CM | POA: Diagnosis not present

## 2019-09-03 DIAGNOSIS — Z6833 Body mass index (BMI) 33.0-33.9, adult: Secondary | ICD-10-CM | POA: Insufficient documentation

## 2019-09-03 DIAGNOSIS — I10 Essential (primary) hypertension: Secondary | ICD-10-CM | POA: Diagnosis not present

## 2019-09-03 MED ORDER — HYDROCHLOROTHIAZIDE 12.5 MG PO TABS: 12.5000 mg | ORAL_TABLET | Freq: Every day | ORAL | 1 refills | Status: DC

## 2019-09-03 NOTE — Progress Notes (Signed)
Date:  09/03/2019   Name:  [name]   DOB:  11/18/1955   MRN:  [id number]   Chief Complaint: Follow-up (stopped spiro- recheck b/p) and Leg Swelling (gotten worse since stopping spiro)  Patient is a 63 year old male who presents for a lower edema exam. The patient reports the following problems: edema. Health maintenance has been reviewed up to date.   Lab Results  Component Value Date   CREATININE 1.06 08/07/2019   BUN 17 08/07/2019   NA 139 08/07/2019   K 4.8 08/07/2019   CL 103 08/07/2019   CO2 21 08/07/2019   Lab Results  Component Value Date   CHOL 215 (H) 08/07/2019   HDL 53 08/07/2019   LDLCALC 151 (H) 08/07/2019   TRIG 64 08/07/2019   CHOLHDL 4.1 08/07/2019   No results found for: TSH No results found for: HGBA1C   Review of Systems  Constitutional: Negative for chills and fever.  HENT: Negative for drooling, ear discharge, ear pain and sore throat.   Respiratory: Negative for cough, chest tightness, shortness of breath and wheezing.   Cardiovascular: Positive for leg swelling. Negative for chest pain and palpitations.  Gastrointestinal: Negative for abdominal pain, blood in stool, constipation, diarrhea and nausea.  Endocrine: Negative for polydipsia.  Genitourinary: Negative for dysuria, frequency, hematuria and urgency.  Musculoskeletal: Negative for back pain, myalgias and neck pain.  Skin: Negative for rash.  Allergic/Immunologic: Negative for environmental allergies.  Neurological: Negative for dizziness and headaches.  Hematological: Does not bruise/bleed easily.  Psychiatric/Behavioral: Negative for suicidal ideas. The patient is not nervous/anxious.     Patient Active Problem List   Diagnosis Date Noted  . HNP (herniated nucleus pulposus), lumbar 06/12/2019  . UTI due to Klebsiella species 05/08/2019  . Biceps tendonitis on right 07/27/2018  . Primary osteoarthritis of right knee 10/12/2017  . Taking multiple medications for chronic  disease 07/30/2015  . Bronchitis 07/01/2015  . Reactive airway disease 07/01/2015  . Allergic sinusitis 07/01/2015  . Essential hypertension 07/01/2015  . Valvular heart disease 07/01/2015  . Metatarsalgia of left foot 06/10/2015  . [place] arthritis 12/15/2013    Allergies  Allergen Reactions  . Mobic [Meloxicam] Palpitations    Past Surgical History:  Procedure Laterality Date  . BACK SURGERY  2018  . COLONOSCOPY    . JOINT REPLACEMENT Left     x2  . LITHOTRIPSY    . [place] SURGERY  47/14/2020  . Lumbar disectomy  01/18/2019  . TRANSFORAMINAL LUMBAR INTERBODY FUSION (TLIF) WITH PEDICLE SCREW FIXATION 1 LEVEL N/A 06/12/2019   Procedure: LUMBAR FOUR-FIVE TRANSFORAMINAL LUMBAR INTERBODY FUSION (TLIF) WITH PEDICLE SCREW FIXATION;  Surgeon: Kary Kos, MD;  Location: [facility];  Service: Neurosurgery;  Laterality: N/A;    Social History   Tobacco Use  . Smoking status: Never Smoker  . Smokeless tobacco: Former Systems developer    Types: Chew  Substance Use Topics  . Alcohol use: Yes    Comment: 2 drinks a month  . Drug use: Never     Medication list has been reviewed and updated.  Current Meds  Medication Sig  . aspirin 325 MG tablet Take 325 mg by mouth every evening.   . celecoxib (CELEBREX) 200 MG capsule TAKE 1 CAPSULE BY MOUTH ONCE DAILY - NEED APPOINTMENT FOR REFILLS  . diltiazem (CARDIZEM CD) 120 MG 24 hr capsule Take 1 capsule (120 mg total) by mouth daily.  . fexofenadine (ALLEGRA) 180 MG tablet Take 1 tablet (  180 mg total) by mouth daily.  . fluorouracil (EFUDEX) 5 % cream Apply 1 application topically daily as needed (precancerous spots).   . fluticasone (FLONASE) 50 MCG/ACT nasal spray Use 1 spray(s) in each nostril once daily  . lisinopril (ZESTRIL) 20 MG tablet Take 1 tablet (20 mg total) by mouth every evening. Dr Nehemiah Massed  . montelukast (SINGULAIR) 10 MG tablet Take 1 tablet (10 mg total) by mouth daily.  . Multiple Vitamins-Minerals (MULTIVITAMIN WITH MINERALS)  tablet Take 1 tablet by mouth daily.  . pantoprazole (PROTONIX) 40 MG tablet Take 1 tablet (40 mg total) by mouth daily.  . potassium chloride SA (KLOR-CON) 20 MEQ tablet Take 3 tablets (60 mEq total) by mouth daily.  . tamsulosin (FLOMAX) 0.4 MG CAPS capsule Take 1 capsule (0.4 mg total) by mouth daily.  . [DISCONTINUED] Docusate Sodium (COLACE PO) Take 1 capsule by mouth daily.  . [DISCONTINUED] spironolactone (ALDACTONE) 25 MG tablet Take 1 tablet (25 mg total) by mouth daily.    PHQ 2/9 Scores 09/03/2019 08/07/2019 12/14/2017 04/08/2016  PHQ - 2 Score 0 0 0 0  PHQ- 9 Score 0 0 0 -    BP Readings from Last 3 Encounters:  09/03/19 140/80  08/07/19 120/80  06/13/19 108/72    Physical Exam Vitals signs and nursing note reviewed.  HENT:     Head: Normocephalic.     Right Ear: Tympanic membrane, ear canal and external ear normal.     Left Ear: Tympanic membrane, ear canal and external ear normal.     Nose: Nose normal.  Eyes:     General: No scleral icterus.       Right eye: No discharge.        Left eye: No discharge.     Conjunctiva/sclera: Conjunctivae normal.     Pupils: Pupils are equal, round, and reactive to light.  Neck:     Musculoskeletal: Normal range of motion and neck supple.     Thyroid: No thyromegaly.     Vascular: No JVD.     Trachea: No tracheal deviation.  Cardiovascular:     Rate and Rhythm: Normal rate and regular rhythm.     Pulses: Normal pulses.     Heart sounds: S1 normal and S2 normal. Murmur present. Systolic murmur present with a grade of 2/6. Diastolic murmur present with a grade of 1/4. No friction rub. No gallop. No S3 or S4 sounds.   Pulmonary:     Effort: No respiratory distress.     Breath sounds: Normal breath sounds. No wheezing or rales.  Abdominal:     General: Bowel sounds are normal.     Palpations: Abdomen is soft. There is no mass.     Tenderness: There is no abdominal tenderness. There is no guarding or rebound.  Musculoskeletal:  Normal range of motion.        General: No tenderness.     Right lower leg: 1+ Pitting Edema present.     Left lower leg: 1+ Pitting Edema present.  Lymphadenopathy:     Cervical: No cervical adenopathy.  Skin:    General: Skin is warm.     Findings: No rash.  Neurological:     Mental Status: He is alert and oriented to person, place, and time.     Cranial Nerves: No cranial nerve deficit.     Deep Tendon Reflexes: Reflexes are normal and symmetric.     Wt Readings from Last 3 Encounters:  09/03/19 252 lb (114.3 kg)  08/07/19 246 lb (111.6 kg)  06/12/19 240 lb (108.9 kg)    BP 140/80   Pulse 80   Ht 6\' 1"  (1.854 m)   Wt 252 lb (114.3 kg)   BMI 33.25 kg/m   Assessment and Plan: 1. Generalized edema Since the recent discontinuance of spironolactone due to nipple discharge there is been a subsequent increase in weight of about 6 pounds as well as lower extremity pitting edema.  In fact there is some generalized edema in the hands with a low titer as well we will reinitiate with hydrochlorothiazide 12.5 mg once a day will recheck in 6 weeks.  If patient continues to have lower extremity swelling will consider evaluation by vascular for venous insufficiency. - hydrochlorothiazide (HYDRODIURIL) 12.5 MG tablet; Take 1 tablet (12.5 mg total) by mouth daily.  Dispense: 90 tablet; Refill: 1  2. Valvular heart disease On cardiac evaluation it was noted that he did have a 2/6 systolic murmur with a 1/6 diastolic particularly over the tricuspid area on review of notes patient has a history of tricuspid and mitral insufficiency.  Presumably this is been more evident since the accumulation of fluid in an increase intravascular volume. - hydrochlorothiazide (HYDRODIURIL) 12.5 MG tablet; Take 1 tablet (12.5 mg total) by mouth daily.  Dispense: 90 tablet; Refill: 1  3. Essential hypertension Chronic.  Controlled.  We will add hydrochlorothiazide 12.5 mg to his current antihypertensive regimen.  - hydrochlorothiazide (HYDRODIURIL) 12.5 MG tablet; Take 1 tablet (12.5 mg total) by mouth daily.  Dispense: 90 tablet; Refill: 1

## 2019-09-03 NOTE — Patient Instructions (Signed)

## 2019-09-13 ENCOUNTER — Other Ambulatory Visit: Payer: Self-pay | Admitting: Urology

## 2019-09-17 ENCOUNTER — Other Ambulatory Visit: Payer: Self-pay

## 2019-09-17 ENCOUNTER — Ambulatory Visit: Payer: Medicare Other | Admitting: Urology

## 2019-09-17 ENCOUNTER — Encounter: Payer: Self-pay | Admitting: Urology

## 2019-09-17 VITALS — BP 147/88 | HR 72 | Ht 73.0 in | Wt 240.0 lb

## 2019-09-17 DIAGNOSIS — N138 Other obstructive and reflux uropathy: Secondary | ICD-10-CM

## 2019-09-17 DIAGNOSIS — N390 Urinary tract infection, site not specified: Secondary | ICD-10-CM | POA: Diagnosis not present

## 2019-09-17 DIAGNOSIS — N401 Enlarged prostate with lower urinary tract symptoms: Secondary | ICD-10-CM

## 2019-09-17 LAB — BLADDER SCAN AMB NON-IMAGING

## 2019-09-17 NOTE — Progress Notes (Signed)
   09/17/2019 1:25 PM   [name] Apr 22, 1956 [id number]  Reason for visit: Discuss MRI fusion biopsy results  HPI: I saw [name] in urology clinic today to discuss his MRI fusion biopsy results.  He was previously followed by Zara Council, PA, and I have not met the patient previously.  On chart review, he had an episode of urinary retention after back surgery in the spring which was ultimately complicated by a urinary tract infection.  A CT stone protocol was performed in August 2020 which showed no evidence of stone disease or hydronephrosis.  PSA was elevated at 11, and remained elevated at 10.4 in follow-up.  He underwent a prostate MRI which showed a 70 g prostate with a left transition zone PI-RADS 3 lesion.  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  Reassuringly, his PSA is trending down and was 4.5 on 08/07/2019.  He denies any urinary symptoms at this time of weak stream, feeling of incomplete emptying, urgency, frequency, dysuria, or gross hematuria.  IPSS score in clinic today is 4, with quality of life mostly satisfied.  PVR is 30 mL.  He continues to take Flomax.  We had a long discussion about his PSA history, MRI findings, and biopsy results.  I suspect he may have had a UTI/prostatitis/possible prostate abscess with his UTI associated with his catheter after surgery which caused his elevated PSA and would be consistent with his biopsy findings.  It sounds like his urinary symptoms have all completely resolved and he is emptying his bladder well.  We talked about UTI prevention strategies including cranberry prophylaxis and adequate hydration.  We also briefly reviewed HoLEP as a treatment strategy for patients with bladder outlet obstruction, incomplete emptying, and recurrent UTIs, but it sounds like he has very minimal issues with  urination and is not at the point of needing an outlet procedure.  We discussed return precautions including difficulty urinating, gross hematuria, or recurrent UTIs.  RTC 1 year with PSA, PVR Re-consider follow-up if recurrent UTIs  A total of 25 minutes were spent face-to-face with the patient, greater than 50% was spent in patient education, counseling, and coordination of care regarding PSA findings, MRI prostate biopsy results, recurrent UTIs, and HOLEP.  [name], [facility] [address], [place], [place] [address] [phone]

## 2019-09-17 NOTE — Patient Instructions (Addendum)
Start Cranberry Tablets daily to prevent UTI  Benign Prostatic Hyperplasia  Benign prostatic hyperplasia (BPH) is an enlarged prostate gland that is caused by the normal aging process and not by cancer. The prostate is a walnut-sized gland that is involved in the production of semen. It is located in front of the rectum and below the bladder. The bladder stores urine and the urethra is the tube that carries the urine out of the body. The prostate may get bigger as a man gets older. An enlarged prostate can press on the urethra. This can make it harder to pass urine. The build-up of urine in the bladder can cause infection. Back pressure and infection may progress to bladder damage and kidney (renal) failure. What are the causes? This condition is part of a normal aging process. However, not all men develop problems from this condition. If the prostate enlarges away from the urethra, urine flow will not be blocked. If it enlarges toward the urethra and compresses it, there will be problems passing urine. What increases the risk? This condition is more likely to develop in men over the age of 25 years. What are the signs or symptoms? Symptoms of this condition include:  Getting up often during the night to urinate.  Needing to urinate frequently during the day.  Difficulty starting urine flow.  Decrease in size and strength of your urine stream.  Leaking (dribbling) after urinating.  Inability to pass urine. This needs immediate treatment.  Inability to completely empty your bladder.  Pain when you pass urine. This is more common if there is also an infection.  Urinary tract infection (UTI). How is this diagnosed? This condition is diagnosed based on your medical history, a physical exam, and your symptoms. Tests will also be done, such as:  A post-void bladder scan. This measures any amount of urine that may remain in your bladder after you finish urinating.  A digital rectal exam.  In a rectal exam, your health care provider checks your prostate by putting a lubricated, gloved finger into your rectum to feel the back of your prostate gland. This exam detects the size of your gland and any abnormal lumps or growths.  An exam of your urine (urinalysis).  A prostate specific antigen (PSA) screening. This is a blood test used to screen for prostate cancer.  An ultrasound. This test uses sound waves to electronically produce a picture of your prostate gland. Your health care provider may refer you to a specialist in kidney and prostate diseases (urologist). How is this treated? Once symptoms begin, your health care provider will monitor your condition (active surveillance or watchful waiting). Treatment for this condition will depend on the severity of your condition. Treatment may include:  Observation and yearly exams. This may be the only treatment needed if your condition and symptoms are mild.  Medicines to relieve your symptoms, including: ? Medicines to shrink the prostate. ? Medicines to relax the muscle of the prostate.  Surgery in severe cases. Surgery may include: ? Prostatectomy. In this procedure, the prostate tissue is removed completely through an open incision or with a laparoscope or robotics. ? Transurethral resection of the prostate (TURP). In this procedure, a tool is inserted through the opening at the tip of the penis (urethra). It is used to cut away tissue of the inner core of the prostate. The pieces are removed through the same opening of the penis. This removes the blockage. ? Transurethral incision (TUIP). In this procedure, small  cuts are made in the prostate. This lessens the prostate's pressure on the urethra. ? Transurethral microwave thermotherapy (TUMT). This procedure uses microwaves to create heat. The heat destroys and removes a small amount of prostate tissue. ? Transurethral needle ablation (TUNA). This procedure uses radio frequencies to  destroy and remove a small amount of prostate tissue. ? Interstitial laser coagulation ([place]). This procedure uses a laser to destroy and remove a small amount of prostate tissue. ? Transurethral electrovaporization (TUVP). This procedure uses electrodes to destroy and remove a small amount of prostate tissue. ? Prostatic urethral lift. This procedure inserts an implant to push the lobes of the prostate away from the urethra. Follow these instructions at home:  Take over-the-counter and prescription medicines only as told by your health care provider.  Monitor your symptoms for any changes. Contact your health care provider with any changes.  Avoid drinking large amounts of liquid before going to bed or out in public.  Avoid or reduce how much caffeine or alcohol you drink.  Give yourself time when you urinate.  Keep all follow-up visits as told by your health care provider. This is important. Contact a health care provider if:  You have unexplained back pain.  Your symptoms do not get better with treatment.  You develop side effects from the medicine you are taking.  Your urine becomes very dark or has a bad smell.  Your lower abdomen becomes distended and you have trouble passing your urine. Get help right away if:  You have a fever or chills.  You suddenly cannot urinate.  You feel lightheaded, or very dizzy, or you faint.  There are large amounts of blood or clots in the urine.  Your urinary problems become hard to manage.  You develop moderate to severe low back or flank pain. The flank is the side of your body between the ribs and the hip. These symptoms may represent a serious problem that is an emergency. Do not wait to see if the symptoms will go away. Get medical help right away. Call your local emergency services (911 in the [place] Do not drive yourself to the hospital. Summary  Benign prostatic hyperplasia (BPH) is an enlarged prostate that is caused by the  normal aging process and not by cancer.  An enlarged prostate can press on the urethra. This can make it hard to pass urine.  This condition is part of a normal aging process and is more likely to develop in men over the age of 22 years.  Get help right away if you suddenly cannot urinate. This information is not intended to replace advice given to you by your health care provider. Make sure you discuss any questions you have with your health care provider. Document Released: 10/10/2005 Document Revised: 09/04/2018 Document Reviewed: 11/14/2016 Elsevier Patient Education  2020 [employer].

## 2019-10-02 ENCOUNTER — Ambulatory Visit: Payer: Medicare Other | Admitting: Family Medicine

## 2019-10-02 ENCOUNTER — Encounter: Payer: Self-pay | Admitting: Family Medicine

## 2019-10-02 ENCOUNTER — Other Ambulatory Visit: Payer: Self-pay

## 2019-10-02 VITALS — BP 130/80 | HR 62 | Ht 73.0 in | Wt 248.0 lb

## 2019-10-02 DIAGNOSIS — I872 Venous insufficiency (chronic) (peripheral): Secondary | ICD-10-CM

## 2019-10-02 DIAGNOSIS — E876 Hypokalemia: Secondary | ICD-10-CM | POA: Diagnosis not present

## 2019-10-02 DIAGNOSIS — I10 Essential (primary) hypertension: Secondary | ICD-10-CM | POA: Diagnosis not present

## 2019-10-02 MED ORDER — POTASSIUM CHLORIDE CRYS ER 20 MEQ PO TBCR: 60.0000 meq | EXTENDED_RELEASE_TABLET | Freq: Every day | ORAL | 3 refills | Status: DC

## 2019-10-02 NOTE — Progress Notes (Signed)
Date:  10/02/2019   Name:  [name]   DOB:  Sep 09, 1956   MRN:  [id number]   Chief Complaint: Follow-up (swelling in lower extremities- better, but seems to be worse when sitting alot.) and hypokalemia  Hypertension This is a chronic problem. The current episode started more than 1 year ago. The problem has been gradually improving since onset. The problem is controlled. Pertinent negatives include no anxiety, blurred vision, chest pain, headaches, malaise/fatigue, neck pain, orthopnea, palpitations, peripheral edema, PND, shortness of breath or sweats. There are no associated agents to hypertension. Past treatments include ACE inhibitors and diuretics. The current treatment provides moderate improvement. There are no compliance problems.  There is no history of angina, kidney disease, CAD/MI, CVA, heart failure, left ventricular hypertrophy, PVD or retinopathy. There is no history of chronic renal disease, a hypertension causing med or renovascular disease.    Lab Results  Component Value Date   CREATININE 1.06 08/07/2019   BUN 17 08/07/2019   NA 139 08/07/2019   K 4.8 08/07/2019   CL 103 08/07/2019   CO2 21 08/07/2019   Lab Results  Component Value Date   CHOL 215 (H) 08/07/2019   HDL 53 08/07/2019   LDLCALC 151 (H) 08/07/2019   TRIG 64 08/07/2019   CHOLHDL 4.1 08/07/2019   No results found for: TSH No results found for: HGBA1C   Review of Systems  Constitutional: Negative for chills, fever and malaise/fatigue.  HENT: Negative for drooling, ear discharge, ear pain and sore throat.   Eyes: Negative for blurred vision.  Respiratory: Negative for cough, shortness of breath and wheezing.   Cardiovascular: Negative for chest pain, palpitations, orthopnea, leg swelling and PND.  Gastrointestinal: Negative for abdominal pain, blood in stool, constipation, diarrhea and nausea.  Endocrine: Negative for polydipsia.  Genitourinary: Negative for dysuria, frequency, hematuria  and urgency.  Musculoskeletal: Negative for back pain, myalgias and neck pain.  Skin: Negative for rash.  Allergic/Immunologic: Negative for environmental allergies.  Neurological: Negative for dizziness and headaches.  Hematological: Does not bruise/bleed easily.  Psychiatric/Behavioral: Negative for suicidal ideas. The patient is not nervous/anxious.     Patient Active Problem List   Diagnosis Date Noted  . HNP (herniated nucleus pulposus), lumbar 06/12/2019  . UTI due to Klebsiella species 05/08/2019  . Biceps tendonitis on right 07/27/2018  . Primary osteoarthritis of right knee 10/12/2017  . Taking multiple medications for chronic disease 07/30/2015  . Bronchitis 07/01/2015  . Reactive airway disease 07/01/2015  . Allergic sinusitis 07/01/2015  . Essential hypertension 07/01/2015  . Valvular heart disease 07/01/2015  . Metatarsalgia of left foot 06/10/2015  . [place] arthritis 12/15/2013    Allergies  Allergen Reactions  . Mobic [Meloxicam] Palpitations    Past Surgical History:  Procedure Laterality Date  . BACK SURGERY  2018  . COLONOSCOPY    . JOINT REPLACEMENT Left     x2  . LITHOTRIPSY    . [place] SURGERY  47/14/2020  . Lumbar disectomy  01/18/2019  . TRANSFORAMINAL LUMBAR INTERBODY FUSION (TLIF) WITH PEDICLE SCREW FIXATION 1 LEVEL N/A 06/12/2019   Procedure: LUMBAR FOUR-FIVE TRANSFORAMINAL LUMBAR INTERBODY FUSION (TLIF) WITH PEDICLE SCREW FIXATION;  Surgeon: Kary Kos, MD;  Location: [facility];  Service: Neurosurgery;  Laterality: N/A;    Social History   Tobacco Use  . Smoking status: Never Smoker  . Smokeless tobacco: Former Systems developer    Types: Chew  Substance Use Topics  . Alcohol use: Yes  Comment: 2 drinks a month  . Drug use: Never     Medication list has been reviewed and updated.  Current Meds  Medication Sig  . aspirin 325 MG tablet Take 325 mg by mouth every evening.   . celecoxib (CELEBREX) 200 MG capsule TAKE 1 CAPSULE BY MOUTH ONCE DAILY  - NEED APPOINTMENT FOR REFILLS  . diltiazem (CARDIZEM CD) 120 MG 24 hr capsule Take 1 capsule (120 mg total) by mouth daily.  . fexofenadine (ALLEGRA) 180 MG tablet Take 1 tablet (180 mg total) by mouth daily.  . fluticasone (FLONASE) 50 MCG/ACT nasal spray Use 1 spray(s) in each nostril once daily  . hydrochlorothiazide (HYDRODIURIL) 12.5 MG tablet Take 1 tablet (12.5 mg total) by mouth daily.  Marland Kitchen lisinopril (ZESTRIL) 20 MG tablet Take 1 tablet (20 mg total) by mouth every evening. Dr Nehemiah Massed  . montelukast (SINGULAIR) 10 MG tablet Take 1 tablet (10 mg total) by mouth daily.  . Multiple Vitamins-Minerals (MULTIVITAMIN WITH MINERALS) tablet Take 1 tablet by mouth daily.  . pantoprazole (PROTONIX) 40 MG tablet Take 1 tablet (40 mg total) by mouth daily.  . potassium chloride SA (KLOR-CON) 20 MEQ tablet Take 3 tablets (60 mEq total) by mouth daily.  . tamsulosin (FLOMAX) 0.4 MG CAPS capsule Take 1 capsule (0.4 mg total) by mouth daily.  . [DISCONTINUED] fluorouracil (EFUDEX) 5 % cream Apply 1 application topically daily as needed (precancerous spots).     PHQ 2/9 Scores 10/02/2019 09/03/2019 08/07/2019 12/14/2017  PHQ - 2 Score 0 0 0 0  PHQ- 9 Score 0 0 0 0    BP Readings from Last 3 Encounters:  10/02/19 130/80  09/17/19 (!) 147/88  09/03/19 140/80    Physical Exam  Wt Readings from Last 3 Encounters:  10/02/19 248 lb (112.5 kg)  09/17/19 240 lb (108.9 kg)  09/03/19 252 lb (114.3 kg)    BP 130/80   Pulse 62   Ht 6\' 1"  (1.854 m)   Wt 248 lb (112.5 kg)   BMI 32.72 kg/m   Assessment and Plan:

## 2019-10-02 NOTE — Patient Instructions (Addendum)
Venous Plethysmography Why am I having this test? You may have a venous plethysmography to:  Check for blockage in a vein. Veins are blood vessels that carry blood from organs and other parts of the body back to the heart.  Help diagnose abnormal blood flow caused by damage to the valves in the veins (varicose veins).  Determine whether there is a blood clot in a calf vein (deep vein thrombosis). What is being tested? This test measures the blood flow in the veins of your arms and legs. How do I prepare for this test?  Wear loose clothing.  Do not use any tobacco products, including cigarettes, chewing tobacco, or e-cigarettes, for at least 30 minutes before the test. Tell a health care provider about:  All medicines you are taking, including vitamins, herbs, eye drops, creams, and over-the-counter medicines.  Any blood disorders you have.  Any tobacco or nicotine use.  Any medical conditions you have. What happens during the test?   Your health care provider will attach a blood pressure cuff or sticky patches (electrodes) to your arms or legs. The cuff or electrodes will be used to measure the pressure changes inside your veins before, during, and after blood flow is stopped when the cuff is inflated.  A device called a pulse volume recorder will be used to record the blood volumes and any degree of blockage (obstruction) or backward flow (reflux) that might be present in the veins.  To measure changes in blood volumes in your arm veins, the health care provider may ask you to: ? Do hand grips by squeezing a tennis ball.  To measure changes in blood volumes in your leg veins, the health care provider may ask you to: ? Lie down and bend your knee or raise your leg up. ? Stand on your tiptoes. The test may vary among health care providers and hospitals. How are the results reported? Your test results will be reported as values of the blood volume (venous volume) and the flow in  your veins (venous filling time). Your health care provider will also look at the graph of your pulse readings. Your health care provider will explain what your results mean. Normal values may vary among labs and hospitals. For this test, common reference values are:  Venous volume: 80-150 mL.  Venous filling time: less than 2 mL/second. What do the results mean? Normal results mean that your blood vessels are healthy. Talk with your health care provider about what your results mean. Questions to ask your health care provider Ask your health care provider, or the department that is doing the test:  When will my results be ready?  How will I get my results?  What are my treatment options?  What other tests do I need?  What are my next steps? Summary  Venous plethysmography is a procedure that helps your health care provider check for abnormal blood flow or blockage in your veins. The test can be used to help diagnose deep vein thrombosis.  Do not use any tobacco products for at least 30 minutes before the procedure.  After attaching a blood pressure cuff or electrodes to your arms or legs, your health care provider will measure pressure changes inside your veins.  A pulse volume recorder is used to estimate any degree of blockage or reflux that might be present in the veins. This information is not intended to replace advice given to you by your health care provider. Make sure you discuss any questions  you have with your health care provider. Document Released: 11/12/2004 Document Revised: 10/10/2017 Document Reviewed: 10/10/2017 Elsevier Patient Education  2020 [place]. Chronic Venous Insufficiency Chronic venous insufficiency is a condition where the leg veins cannot effectively pump blood from the legs to the heart. This happens when the vein walls are either stretched, weakened, or damaged, or when the valves inside the vein are damaged. With the right treatment, you should  be able to continue with an active life. This condition is also called venous stasis. What are the causes? Common causes of this condition include:  High blood pressure inside the veins (venous hypertension).  Sitting or standing too long, causing increased blood pressure in the leg veins.  A blood clot that blocks blood flow in a vein (deep vein thrombosis, DVT).  Inflammation of a vein (phlebitis) that causes a blood clot to form.  Tumors in the pelvis that cause blood to back up. What increases the risk? The following factors may make you more likely to develop this condition:  Having a family history of this condition.  Obesity.  Pregnancy.  Living without enough regular physical activity or exercise (sedentary lifestyle).  Smoking.  Having a job that requires long periods of standing or sitting in one place.  Being a certain age. Women in their 46s and 53s and men in their 25s are more likely to develop this condition. What are the signs or symptoms? Symptoms of this condition include:  Veins that are enlarged, bulging, or twisted (varicose veins).  Skin breakdown or ulcers.  Reddened skin or dark discoloration of skin on the leg between the knee and ankle.  Brown, smooth, tight, and painful skin just above the ankle, usually on the inside of the leg (lipodermatosclerosis).  Swelling of the legs. How is this diagnosed? This condition may be diagnosed based on:  Your medical history.  A physical exam.  Tests, such as: ? A procedure that creates an image of a blood vessel and nearby organs and provides information about blood flow through the blood vessel (duplex ultrasound). ? A procedure that tests blood flow (plethysmography). ? A procedure that looks at the veins using X-ray and dye (venogram). How is this treated? The goals of treatment are to help you return to an active life and to minimize pain or disability. Treatment depends on the severity of your  condition, and it may include:  Wearing compression stockings. These can help relieve symptoms and help prevent your condition from getting worse. However, they do not cure the condition.  Sclerotherapy. This procedure involves an injection of a solution that shrinks damaged veins.  Surgery. This may involve: ? Removing a diseased vein (vein stripping). ? Cutting off blood flow through the vein (laser ablation surgery). ? Repairing or reconstructing a valve within the affected vein. Follow these instructions at home:      Wear compression stockings as told by your health care provider. These stockings help to prevent blood clots and reduce swelling in your legs.  Take over-the-counter and prescription medicines only as told by your health care provider.  Stay active by exercising, walking, or doing different activities. Ask your health care provider what activities are safe for you and how much exercise you need.  Drink enough fluid to keep your urine pale yellow.  Do not use any products that contain nicotine or tobacco, such as cigarettes, e-cigarettes, and chewing tobacco. If you need help quitting, ask your health care provider.  Keep all follow-up visits  as told by your health care provider. This is important. Contact a health care provider if you:  Have redness, swelling, or more pain in the affected area.  See a red streak or line that goes up or down from the affected area.  Have skin breakdown or skin loss in the affected area, even if the breakdown is small.  Get an injury in the affected area. Get help right away if:  You get an injury and an open wound in the affected area.  You have: ? Severe pain that does not get better with medicine. ? Sudden numbness or weakness in the foot or ankle below the affected area. ? Trouble moving your foot or ankle. ? A fever. ? Worse or persistent symptoms. ? Chest pain. ? Shortness of breath. Summary  Chronic venous  insufficiency is a condition where the leg veins cannot effectively pump blood from the legs to the heart.  Chronic venous insufficiency occurs when the vein walls become stretched, weakened, or damaged, or when valves within the vein are damaged.  Treatment depends on how severe your condition is. It often involves wearing compression stockings and may involve having a procedure.  Make sure you stay active by exercising, walking, or doing different activities. Ask your health care provider what activities are safe for you and how much exercise you need. This information is not intended to replace advice given to you by your health care provider. Make sure you discuss any questions you have with your health care provider. Document Released: 02/13/2007 Document Revised: 07/03/2018 Document Reviewed: 07/03/2018 Elsevier Patient Education  2020 [employer].

## 2019-10-03 LAB — ELECTROLYTE PANEL
CO2: 23 mmol/L (ref 20–29)
Chloride: 103 mmol/L (ref 96–106)
Potassium: 4.1 mmol/L (ref 3.5–5.2)
Sodium: 140 mmol/L (ref 134–144)

## 2019-10-05 ENCOUNTER — Other Ambulatory Visit: Payer: Self-pay | Admitting: Family Medicine

## 2019-10-05 DIAGNOSIS — M5136 Other intervertebral disc degeneration, lumbar region: Secondary | ICD-10-CM

## 2019-10-05 DIAGNOSIS — M1712 Unilateral primary osteoarthritis, left knee: Secondary | ICD-10-CM

## 2019-10-24 ENCOUNTER — Other Ambulatory Visit: Admission: RE | Admit: 2019-10-24 | Payer: Medicare Other | Source: Ambulatory Visit

## 2019-10-28 ENCOUNTER — Ambulatory Visit: Admission: RE | Admit: 2019-10-28 | Payer: Medicare HMO | Source: Home / Self Care | Admitting: Internal Medicine

## 2019-10-28 ENCOUNTER — Encounter: Admission: RE | Payer: Self-pay | Source: Home / Self Care

## 2019-10-28 SURGERY — CARDIOVERSION
Anesthesia: General

## 2019-11-08 LAB — HM COLONOSCOPY

## 2020-01-20 ENCOUNTER — Other Ambulatory Visit: Payer: Self-pay | Admitting: Family Medicine

## 2020-01-20 DIAGNOSIS — M5136 Other intervertebral disc degeneration, lumbar region: Secondary | ICD-10-CM

## 2020-01-20 DIAGNOSIS — M1712 Unilateral primary osteoarthritis, left knee: Secondary | ICD-10-CM

## 2020-02-05 ENCOUNTER — Other Ambulatory Visit: Payer: Self-pay | Admitting: Family Medicine

## 2020-02-05 DIAGNOSIS — J302 Other seasonal allergic rhinitis: Secondary | ICD-10-CM

## 2020-02-06 ENCOUNTER — Other Ambulatory Visit: Payer: Self-pay | Admitting: Neurosurgery

## 2020-02-06 DIAGNOSIS — M48062 Spinal stenosis, lumbar region with neurogenic claudication: Secondary | ICD-10-CM

## 2020-02-17 ENCOUNTER — Other Ambulatory Visit: Payer: Self-pay | Admitting: Urology

## 2020-02-17 DIAGNOSIS — N401 Enlarged prostate with lower urinary tract symptoms: Secondary | ICD-10-CM

## 2020-02-21 ENCOUNTER — Other Ambulatory Visit: Payer: Self-pay

## 2020-02-21 ENCOUNTER — Ambulatory Visit
Admission: RE | Admit: 2020-02-21 | Discharge: 2020-02-21 | Disposition: A | Discharge status: Home / Self Care | Payer: Medicare PPO | Source: Ambulatory Visit | Attending: Neurosurgery | Admitting: Neurosurgery

## 2020-02-21 DIAGNOSIS — M48062 Spinal stenosis, lumbar region with neurogenic claudication: Secondary | ICD-10-CM | POA: Diagnosis present

## 2020-02-21 MED ORDER — GADOBUTROL 1 MMOL/ML IV SOLN
10.0000 mL | Freq: Once | INTRAVENOUS | Status: AC | PRN
  Administered 2020-02-21: 09:00:00 10 mL via INTRAVENOUS

## 2020-02-26 ENCOUNTER — Other Ambulatory Visit: Payer: Self-pay | Admitting: Family Medicine

## 2020-02-26 DIAGNOSIS — R601 Generalized edema: Secondary | ICD-10-CM

## 2020-02-26 DIAGNOSIS — I10 Essential (primary) hypertension: Secondary | ICD-10-CM

## 2020-02-26 DIAGNOSIS — I38 Endocarditis, valve unspecified: Secondary | ICD-10-CM

## 2020-02-26 NOTE — Telephone Encounter (Signed)
Requested Prescriptions  Pending Prescriptions Disp Refills  . hydrochlorothiazide (HYDRODIURIL) 12.5 MG tablet [Pharmacy Med Name: hydroCHLOROthiazide 12.5 MG Oral Tablet] 90 tablet 0    Sig: Take 1 tablet by mouth once daily     Cardiovascular: Diuretics - Thiazide Passed - 02/26/2020  5:31 AM      Passed - Ca in normal range and within 360 days    Calcium  Date Value Ref Range Status  08/07/2019 9.1 8.6 - 10.2 mg/dL Final         Passed - Cr in normal range and within 360 days    Creatinine  Date Value Ref Range Status  12/04/2013 1.31 (H) 0.60 - 1.30 mg/dL Final   Creatinine, Ser  Date Value Ref Range Status  08/07/2019 1.06 0.76 - 1.27 mg/dL Final         Passed - K in normal range and within 360 days    Potassium  Date Value Ref Range Status  10/02/2019 4.1 3.5 - 5.2 mmol/L Final         Passed - Na in normal range and within 360 days    Sodium  Date Value Ref Range Status  10/02/2019 140 134 - 144 mmol/L Final         Passed - Last BP in normal range    BP Readings from Last 1 Encounters:  10/02/19 130/80         Passed - Valid encounter within last 6 months    Recent Outpatient Visits          4 months ago Essential hypertension   [place], Deanna C, MD   5 months ago Generalized edema   [facility] Juline Patch, MD   6 months ago Essential hypertension   [place], Deanna C, MD   10 months ago Viral syndrome   [facility] Juline Patch, MD   1 year ago Urinary tract infection associated with indwelling urethral catheter, initial encounter [facility])   [facility] Juline Patch, MD      Future Appointments            In 1 month Juline Patch, MD [facility], [place]   In 6 months [facility], [facility], [place]

## 2020-03-02 ENCOUNTER — Other Ambulatory Visit: Payer: Self-pay | Admitting: Neurosurgery

## 2020-03-02 DIAGNOSIS — M5440 Lumbago with sciatica, unspecified side: Secondary | ICD-10-CM

## 2020-03-03 ENCOUNTER — Ambulatory Visit
Admission: RE | Admit: 2020-03-03 | Discharge: 2020-03-03 | Disposition: A | Discharge status: Home / Self Care | Payer: Medicare PPO | Source: Ambulatory Visit | Attending: Neurosurgery | Admitting: Neurosurgery

## 2020-03-03 DIAGNOSIS — M5440 Lumbago with sciatica, unspecified side: Secondary | ICD-10-CM

## 2020-03-09 ENCOUNTER — Other Ambulatory Visit: Payer: Self-pay | Admitting: Family Medicine

## 2020-03-09 DIAGNOSIS — J302 Other seasonal allergic rhinitis: Secondary | ICD-10-CM

## 2020-03-12 NOTE — Progress Notes (Signed)
[place], [place] [address] Phone: [phone] Fax: [phone]      Your procedure is scheduled on Mar 16, 2020.  Report to [facility] Main Entrance "A" at 10:15 A.M., and check in at the Admitting office.  Call this number if you have problems the morning of surgery:  [phone]  Call [phone] if you have any questions prior to your surgery date Monday-Friday 8am-4pm    Remember:  Do not eat or drink after midnight the night before your surgery   Take these medicines the morning of surgery with A SIP OF WATER : Diltiazem (Cardizem CD) Fexofenadine (Allegra) Flecainide (Tambocor) Metoprolol Tartrate (Lopressor) Tamsulosin (Flomax) Eye Drops if needed Tramadol (Ultram) if needed Flonase Nasal spray if needed  FOLLOW YOUR DOCTOR's INSTRUCTIONS WHEN TO [place].  If no instructions were provided, you need to call your doctor.  As of today, STOP taking any Aspirin (unless otherwise instructed by your surgeon) and Aspirin containing products, Celecoxib (Celebrex), Aleve, Naproxen, Ibuprofen, Motrin, Advil, Goody's, BC's, all herbal medications, fish oil, and all vitamins.                     Do not wear jewelry.            Do not wear lotions, powders, perfumes/colognes, or deodorant.            Do not shave 48 hours prior to surgery.  Men may shave face and neck.            Do not bring valuables to the hospital.            [facility] is not responsible for any belongings or valuables.  Do NOT Smoke (Tobacco/Vapping) or drink Alcohol 24 hours prior to your procedure If you use a CPAP at night, you may bring all equipment for your overnight stay.   Contacts, glasses, dentures or bridgework may not be worn into surgery.      For patients admitted to the hospital, discharge time will be determined by your treatment team.   Patients discharged the day of surgery will not be allowed to drive  home, and someone needs to stay with them for 24 hours.    Special instructions:   [facility]- Preparing For Surgery  Before surgery, you can play an important role. Because skin is not sterile, your skin needs to be as free of germs as possible. You can reduce the number of germs on your skin by washing with CHG (chlorahexidine gluconate) Soap before surgery.  CHG is an antiseptic cleaner which kills germs and bonds with the skin to continue killing germs even after washing.    Oral Hygiene is also important to reduce your risk of infection.  Remember - BRUSH YOUR TEETH THE MORNING OF SURGERY WITH YOUR REGULAR TOOTHPASTE  Please do not use if you have an allergy to CHG or antibacterial soaps. If your skin becomes reddened/irritated stop using the CHG.  Do not shave (including legs and underarms) for at least 48 hours prior to first CHG shower. It is OK to shave your face.  Please follow these instructions carefully.   1. Shower the NIGHT BEFORE SURGERY and the MORNING OF SURGERY with CHG Soap.   2. If you chose to wash your hair, wash your hair first as usual with your normal shampoo.  3. After you shampoo, rinse your hair and body thoroughly  to remove the shampoo.  4. Use CHG as you would any other liquid soap. You can apply CHG directly to the skin and wash gently with a scrungie or a clean washcloth.   5. Apply the CHG Soap to your body ONLY FROM THE NECK DOWN.  Do not use on open wounds or open sores. Avoid contact with your eyes, ears, mouth and genitals (private parts). Wash Face and genitals (private parts)  with your normal soap.   6. Wash thoroughly, paying special attention to the area where your surgery will be performed.  7. Thoroughly rinse your body with warm water from the neck down.  8. DO NOT shower/wash with your normal soap after using and rinsing off the CHG Soap.  9. Pat yourself dry with a CLEAN TOWEL.  10. Wear CLEAN PAJAMAS to bed the night before surgery,  wear comfortable clothes the morning of surgery  11. Place CLEAN SHEETS on your bed the night of your first shower and DO NOT SLEEP WITH PETS.   Day of Surgery:   Do not apply any deodorants/lotions.  Please wear clean clothes to the hospital/surgery center.   Remember to brush your teeth WITH YOUR REGULAR TOOTHPASTE.   Please read over the following fact sheets that you were given.

## 2020-03-13 ENCOUNTER — Encounter (HOSPITAL_COMMUNITY)
Admission: RE | Admit: 2020-03-13 | Discharge: 2020-03-13 | Disposition: A | Discharge status: Home / Self Care | Payer: Medicare PPO | Source: Ambulatory Visit | Attending: Neurosurgery | Admitting: Neurosurgery

## 2020-03-13 ENCOUNTER — Other Ambulatory Visit: Payer: Self-pay

## 2020-03-13 ENCOUNTER — Encounter (HOSPITAL_COMMUNITY): Payer: Self-pay

## 2020-03-13 ENCOUNTER — Other Ambulatory Visit (HOSPITAL_COMMUNITY)
Admission: RE | Admit: 2020-03-13 | Discharge: 2020-03-13 | Disposition: A | Discharge status: Home / Self Care | Payer: Medicare PPO | Source: Ambulatory Visit | Attending: Neurosurgery | Admitting: Neurosurgery

## 2020-03-13 DIAGNOSIS — Z01818 Encounter for other preprocedural examination: Secondary | ICD-10-CM | POA: Insufficient documentation

## 2020-03-13 DIAGNOSIS — R011 Cardiac murmur, unspecified: Secondary | ICD-10-CM | POA: Insufficient documentation

## 2020-03-13 DIAGNOSIS — Z20822 Contact with and (suspected) exposure to covid-19: Secondary | ICD-10-CM | POA: Diagnosis not present

## 2020-03-13 DIAGNOSIS — E785 Hyperlipidemia, unspecified: Secondary | ICD-10-CM | POA: Insufficient documentation

## 2020-03-13 DIAGNOSIS — I480 Paroxysmal atrial fibrillation: Secondary | ICD-10-CM | POA: Insufficient documentation

## 2020-03-13 DIAGNOSIS — I10 Essential (primary) hypertension: Secondary | ICD-10-CM | POA: Insufficient documentation

## 2020-03-13 DIAGNOSIS — M48061 Spinal stenosis, lumbar region without neurogenic claudication: Secondary | ICD-10-CM | POA: Insufficient documentation

## 2020-03-13 DIAGNOSIS — Z79899 Other long term (current) drug therapy: Secondary | ICD-10-CM | POA: Insufficient documentation

## 2020-03-13 DIAGNOSIS — Z7901 Long term (current) use of anticoagulants: Secondary | ICD-10-CM | POA: Insufficient documentation

## 2020-03-13 DIAGNOSIS — Z7982 Long term (current) use of aspirin: Secondary | ICD-10-CM | POA: Insufficient documentation

## 2020-03-13 DIAGNOSIS — Z87891 Personal history of nicotine dependence: Secondary | ICD-10-CM | POA: Insufficient documentation

## 2020-03-13 HISTORY — DX: Malignant (primary) neoplasm, unspecified: C80.1

## 2020-03-13 HISTORY — DX: Cardiac arrhythmia, unspecified: I49.9

## 2020-03-13 HISTORY — DX: Cardiac murmur, unspecified: R01.1

## 2020-03-13 LAB — SURGICAL PCR SCREEN
MRSA, PCR: NEGATIVE
Staphylococcus aureus: POSITIVE — AB

## 2020-03-13 LAB — BASIC METABOLIC PANEL
Anion gap: 10 (ref 5–15)
BUN: 15 mg/dL (ref 8–23)
CO2: 26 mmol/L (ref 22–32)
Calcium: 8.8 mg/dL — ABNORMAL LOW (ref 8.9–10.3)
Chloride: 105 mmol/L (ref 98–111)
Creatinine, Ser: 1.03 mg/dL (ref 0.61–1.24)
GFR calc Af Amer: >60 mL/min (ref >60)
GFR calc non Af Amer: >60 mL/min (ref >60)
Glucose, Bld: 123 mg/dL — ABNORMAL HIGH (ref 70–99)
Potassium: 3.5 mmol/L (ref 3.5–5.1)
Sodium: 141 mmol/L (ref 135–145)

## 2020-03-13 LAB — SARS CORONAVIRUS 2 (TAT 6-24 HRS): SARS Coronavirus 2: NEGATIVE

## 2020-03-13 LAB — CBC
HCT: 44.3 % (ref 39.0–52.0)
Hemoglobin: 14.1 g/dL (ref 13.0–17.0)
MCH: 25.3 pg — ABNORMAL LOW (ref 26.0–34.0)
MCHC: 31.8 g/dL (ref 30.0–36.0)
MCV: 79.4 fL — ABNORMAL LOW (ref 80.0–100.0)
Platelets: 169 10*3/uL (ref 150–400)
RBC: 5.58 MIL/uL (ref 4.22–5.81)
RDW: 17.7 % — ABNORMAL HIGH (ref 11.5–15.5)
WBC: 5.9 10*3/uL (ref 4.0–10.5)
nRBC: 0 % (ref 0.0–0.2)

## 2020-03-13 LAB — PROTIME-INR
INR: 1.1 (ref 0.8–1.2)
Prothrombin Time: 13.6 seconds (ref 11.4–15.2)

## 2020-03-13 LAB — TYPE AND SCREEN
ABO/RH(D): O NEG
Antibody Screen: NEGATIVE

## 2020-03-13 NOTE — Anesthesia Preprocedure Evaluation (Addendum)
Anesthesia Evaluation  Patient identified by MRN, date of birth, ID band Patient awake    Reviewed: Allergy & Precautions, NPO status , Patient's Chart, lab work & pertinent test results  Airway Mallampati: II  TM Distance: >3 FB Neck ROM: Full    Dental no notable dental hx. (+) Dental Advisory Given   Pulmonary neg pulmonary ROS, former smoker,    Pulmonary exam normal breath sounds clear to auscultation       Cardiovascular hypertension, Pt. on medications and Pt. on home beta blockers Normal cardiovascular exam+ dysrhythmias Atrial Fibrillation  Rhythm:Regular Rate:Normal     Neuro/Psych negative neurological ROS  negative psych ROS   GI/Hepatic negative GI ROS, Neg liver ROS,   Endo/Other  negative endocrine ROS  Renal/GU negative Renal ROS  negative genitourinary   Musculoskeletal negative musculoskeletal ROS (+)   Abdominal   Peds negative pediatric ROS (+)  Hematology negative hematology ROS (+)   Anesthesia Other Findings   Reproductive/Obstetrics negative OB ROS                            Anesthesia Physical Anesthesia Plan  ASA: III  Anesthesia Plan: General   Post-op Pain Management:    Induction: Intravenous  PONV Risk Score and Plan: 3 and Ondansetron, Dexamethasone and Treatment may vary due to age or medical condition  Airway Management Planned: Oral ETT  Additional Equipment:   Intra-op Plan:   Post-operative Plan: Extubation in OR  Informed Consent: I have reviewed the patients History and Physical, chart, labs and discussed the procedure including the risks, benefits and alternatives for the proposed anesthesia with the patient or authorized representative who has indicated his/her understanding and acceptance.     Dental advisory given  Plan Discussed with: CRNA and Surgeon  Anesthesia Plan Comments: (PAT note written 03/13/2020 by Myra Gianotti,  PA-C. )       Anesthesia Quick Evaluation

## 2020-03-13 NOTE — Progress Notes (Signed)
Anesthesia Chart Review:  Case: [id number] Date/Time: 03/16/20 1146   Procedure: PLIF - L3-L4 - L2-L3 (N/A Back)   Anesthesia type: General   Pre-op diagnosis: Stenosis   Location: [facility] OR ROOM 18 / [place] OR   Surgeons: Kary Kos, MD      DISCUSSION: Patient will be a 64 year old male by his surgery date and is scheduled for the above procedure. He is s/p L4-5 transforaminal lumbar interbody fusion on 06/12/2019.  History includes former smoker, HTN, HLD, PAF (onsent > 5 years ago), hard of hearing (right), murmur, skin cancer, back surgery (lumbar discectomy 2020, L4-5 fusion 06/12/19). Said he "woke up" with knee surgery.   Last visit with cardiologist Dr. Nehemiah Massed was on 12/03/19 for PAF and HTN follow-up.  Beta-blocker stopped due to side effects.  He will have treatment of atrial fibrillation rhythm with flecainide as needed.  Advised to closely monitor for any symptoms.  No further cardiac intervention recommended at that time due to "relative stability of this current condition."  He also discussed potentially stopping anticoagulation if no further recurrence of A. fib for several months. In April, Dr. Nehemiah Massed had given permission to hold Eliquis and ASA for recent oral surgery. He reported last Eliquis and aspirin 03/09/2020 for this surgery.  03/13/2020 presurgical COVID-19 test is in process.  He will get vitals on arrival to reevaluate BP. Anesthesia team to evaluate on the day of surgery.    VS: BP (!) 158/104   Pulse 63   Temp 36.7 C (Oral)   Resp 18   Ht 6\' 1"  (1.854 m)   Wt 111.5 kg   SpO2 97%   BMI 32.43 kg/m  I do not see that BP was rechecked at PAT. BP was 140/90 at 12/03/19 visit with Dr. Nehemiah Massed. BP Readings from Last 3 Encounters:  03/13/20 (!) 158/104  10/02/19 130/80  09/17/19 (!) 147/88    PROVIDERS: Juline Patch, MD Serafina Royals, MD is cardiologist [facility], see [place])   LABS: Labs reviewed: Acceptable for surgery. (all labs  ordered are listed, but only abnormal results are displayed)  Labs Reviewed  SURGICAL PCR SCREEN - Abnormal; Notable for the following components:      Result Value   Staphylococcus aureus POSITIVE (*)    All other components within normal limits  BASIC METABOLIC PANEL - Abnormal; Notable for the following components:   Glucose, Bld 123 (*)    Calcium 8.8 (*)    All other components within normal limits  CBC - Abnormal; Notable for the following components:   MCV 79.4 (*)    MCH 25.3 (*)    RDW 17.7 (*)    All other components within normal limits  PROTIME-INR  TYPE AND SCREEN     IMAGES: CT L-spine 03/03/20: IMPRESSION: 1. L4-L5 decompression and fusion since the August CT. No adverse features; developing interbody ossification although scant arthrodesis at this time. 2. Other lumbar levels remain stable from the August CT, and the MRI last month.  3. Aortic Atherosclerosis (ICD10-I70.0).   EKG: 03/13/20: Normal sinus rhythm Q 3,F doubt old IMI Abnormal ECG NSR replaces afib since 2018 Confirmed by Jenkins Rouge [id number]) on 03/13/2020 12:17:49 PM - He has had possible inferior infarct on prior EKG from [facility] (scanned under Media tab, 06/12/19)  CV: ETT 03/16/17 [facility]): According to 03/22/17 note by Dr. Nehemiah Massed, "Regular Stress was performed showing Normal test.   There is no recent echocardiogram.   Past Medical History:  Diagnosis Date  . Allergy   . Arthritis   . Cancer ([place])    skin  . Complication of anesthesia    "Woke up with knee surgery"  . Dysrhythmia    Hx of Afib  . Heart murmur   . History of kidney stones    passed 1  . HOH (hard of hearing)    right ear  . Hyperlipidemia   . Hypertension   . Hypokalemia   . Joint pain     Past Surgical History:  Procedure Laterality Date  . BACK SURGERY  2018  . COLONOSCOPY    . HERNIA REPAIR    . JOINT REPLACEMENT Left     x2  . LITHOTRIPSY    . [place] SURGERY   47/14/2020  . Lumbar disectomy  01/18/2019  . TRANSFORAMINAL LUMBAR INTERBODY FUSION (TLIF) WITH PEDICLE SCREW FIXATION 1 LEVEL N/A 06/12/2019   Procedure: LUMBAR FOUR-FIVE TRANSFORAMINAL LUMBAR INTERBODY FUSION (TLIF) WITH PEDICLE SCREW FIXATION;  Surgeon: Kary Kos, MD;  Location: [place];  Service: Neurosurgery;  Laterality: N/A;    MEDICATIONS: . apixaban (ELIQUIS) 5 MG TABS tablet  . aspirin 325 MG tablet  . celecoxib (CELEBREX) 200 MG capsule  . diltiazem (CARDIZEM CD) 120 MG 24 hr capsule  . fexofenadine (ALLEGRA) 180 MG tablet  . flecainide (TAMBOCOR) 100 MG tablet  . fluticasone (FLONASE) 50 MCG/ACT nasal spray  . hydrochlorothiazide (HYDRODIURIL) 12.5 MG tablet  . lisinopril (ZESTRIL) 20 MG tablet  . metoprolol tartrate (LOPRESSOR) 50 MG tablet  . montelukast (SINGULAIR) 10 MG tablet  . Multiple Vitamins-Minerals (MULTIVITAMIN WITH MINERALS) tablet  . Olopatadine HCl (PATADAY) 0.2 % SOLN  . pantoprazole (PROTONIX) 40 MG tablet  . potassium chloride SA (KLOR-CON) 20 MEQ tablet  . tamsulosin (FLOMAX) 0.4 MG CAPS capsule  . traMADol (ULTRAM) 50 MG tablet   No current facility-administered medications for this encounter.     Myra Gianotti, PA-C Surgical Short Stay/Anesthesiology [facility] Phone [phone] [facility] Phone [phone] 03/13/2020 4:07 PM

## 2020-03-13 NOTE — Progress Notes (Signed)
PCP - Dr. Otilio Miu Cardiologist - Dr. Serafina Royals  PPM/ICD - Denies  Chest x-ray - N/A EKG - 03/13/20 Stress Test - 02/2017 ECHO - Denies Cardiac Cath - Denies  Sleep Study - Denies  Pt denies being diabetic.  Blood Thinner Instructions: LD of Eliquis - 03/09/20 Aspirin Instructions: LD - 03/09/20  Pt's BP elevated during PAT appt. Pt instructed to keep an eye on BP at home and to contact PCP/cardiologist if BP is not controlled. Pt verbally understands instructions.  ERAS Protcol - No  COVID TEST- 03/13/20   Coronavirus Screening  Have you experienced the following symptoms:  Cough yes/no: No Fever (>100.51F)  yes/no: No Runny nose yes/no: No Sore throat yes/no: No Difficulty breathing/shortness of breath  yes/no: No  Have you or a family member traveled in the last 14 days and where? yes/no: No   If the patient indicates "YES" to the above questions, their PAT will be rescheduled to limit the exposure to others and, the surgeon will be notified. THE PATIENT WILL NEED TO BE ASYMPTOMATIC FOR 14 DAYS.   If the patient is not experiencing any of these symptoms, the PAT nurse will instruct them to NOT bring anyone with them to their appointment since they may have these symptoms or traveled as well.   Please remind your patients and families that hospital visitation restrictions are in effect and the importance of the restrictions.     Anesthesia review: yes, abnormal EKG  Patient denies shortness of breath, fever, cough and chest pain at PAT appointment   All instructions explained to the patient, with a verbal understanding of the material. Patient agrees to go over the instructions while at home for a better understanding. Patient also instructed to self quarantine after being tested for COVID-19. The opportunity to ask questions was provided.

## 2020-03-16 ENCOUNTER — Encounter (HOSPITAL_COMMUNITY)
Admission: RE | Disposition: A | Discharge status: Home / Self Care | Payer: Self-pay | Source: Home / Self Care | Attending: Neurosurgery

## 2020-03-16 ENCOUNTER — Inpatient Hospital Stay (HOSPITAL_COMMUNITY)
Admission: RE | Admit: 2020-03-16 | Discharge: 2020-03-18 | DRG: 455 | Disposition: A | Discharge status: Home / Self Care | Payer: Medicare PPO | Attending: Neurosurgery | Admitting: Neurosurgery

## 2020-03-16 ENCOUNTER — Encounter (HOSPITAL_COMMUNITY): Payer: Self-pay | Admitting: Neurosurgery

## 2020-03-16 ENCOUNTER — Inpatient Hospital Stay (HOSPITAL_COMMUNITY): Payer: Medicare PPO | Admitting: Vascular Surgery

## 2020-03-16 ENCOUNTER — Inpatient Hospital Stay (HOSPITAL_COMMUNITY): Payer: Medicare PPO | Admitting: Certified Registered Nurse Anesthetist

## 2020-03-16 ENCOUNTER — Inpatient Hospital Stay (HOSPITAL_COMMUNITY): Payer: Medicare PPO

## 2020-03-16 ENCOUNTER — Other Ambulatory Visit: Payer: Self-pay

## 2020-03-16 DIAGNOSIS — M48062 Spinal stenosis, lumbar region with neurogenic claudication: Secondary | ICD-10-CM | POA: Diagnosis present

## 2020-03-16 DIAGNOSIS — Z886 Allergy status to analgesic agent status: Secondary | ICD-10-CM

## 2020-03-16 DIAGNOSIS — Z85828 Personal history of other malignant neoplasm of skin: Secondary | ICD-10-CM

## 2020-03-16 DIAGNOSIS — M79605 Pain in left leg: Secondary | ICD-10-CM | POA: Diagnosis present

## 2020-03-16 DIAGNOSIS — Z83438 Family history of other disorder of lipoprotein metabolism and other lipidemia: Secondary | ICD-10-CM

## 2020-03-16 DIAGNOSIS — E785 Hyperlipidemia, unspecified: Secondary | ICD-10-CM | POA: Diagnosis present

## 2020-03-16 DIAGNOSIS — I4891 Unspecified atrial fibrillation: Secondary | ICD-10-CM | POA: Diagnosis present

## 2020-03-16 DIAGNOSIS — M5126 Other intervertebral disc displacement, lumbar region: Principal | ICD-10-CM | POA: Diagnosis present

## 2020-03-16 DIAGNOSIS — Z791 Long term (current) use of non-steroidal anti-inflammatories (NSAID): Secondary | ICD-10-CM

## 2020-03-16 DIAGNOSIS — Z7982 Long term (current) use of aspirin: Secondary | ICD-10-CM

## 2020-03-16 DIAGNOSIS — Z419 Encounter for procedure for purposes other than remedying health state, unspecified: Secondary | ICD-10-CM

## 2020-03-16 DIAGNOSIS — M47816 Spondylosis without myelopathy or radiculopathy, lumbar region: Secondary | ICD-10-CM | POA: Diagnosis present

## 2020-03-16 DIAGNOSIS — Z981 Arthrodesis status: Secondary | ICD-10-CM

## 2020-03-16 DIAGNOSIS — Z87891 Personal history of nicotine dependence: Secondary | ICD-10-CM

## 2020-03-16 DIAGNOSIS — M48061 Spinal stenosis, lumbar region without neurogenic claudication: Secondary | ICD-10-CM | POA: Diagnosis present

## 2020-03-16 DIAGNOSIS — Z7901 Long term (current) use of anticoagulants: Secondary | ICD-10-CM

## 2020-03-16 DIAGNOSIS — I10 Essential (primary) hypertension: Secondary | ICD-10-CM | POA: Diagnosis present

## 2020-03-16 DIAGNOSIS — Z79899 Other long term (current) drug therapy: Secondary | ICD-10-CM

## 2020-03-16 SURGERY — POSTERIOR LUMBAR FUSION 2 LEVEL
Anesthesia: General | Site: Back

## 2020-03-16 MED ORDER — HYDROMORPHONE HCL 1 MG/ML IJ SOLN
INTRAMUSCULAR | Status: AC
  Administered 2020-03-16: 0.5 mg via INTRAVENOUS
  Filled 2020-03-16: qty 2

## 2020-03-16 MED ORDER — ADULT MULTIVITAMIN W/MINERALS CH
1.0000 | ORAL_TABLET | Freq: Every evening | ORAL | Status: DC
  Administered 2020-03-17: 1 via ORAL
  Filled 2020-03-16 (×3): qty 1

## 2020-03-16 MED ORDER — ASPIRIN 325 MG PO TABS: 325.0000 mg | ORAL_TABLET | Freq: Every evening | ORAL | Status: DC

## 2020-03-16 MED ORDER — PANTOPRAZOLE SODIUM 40 MG IV SOLR: 40.0000 mg | Freq: Every day | INTRAVENOUS | Status: DC

## 2020-03-16 MED ORDER — PHENYLEPHRINE HCL-NACL 10-0.9 MG/250ML-% IV SOLN
INTRAVENOUS | Status: DC | PRN
  Administered 2020-03-16: 25 ug/min via INTRAVENOUS

## 2020-03-16 MED ORDER — THROMBIN 20000 UNITS EX SOLR
CUTANEOUS | Status: DC | PRN
  Administered 2020-03-16 (×2): 20 mL via TOPICAL

## 2020-03-16 MED ORDER — LIDOCAINE 2% (20 MG/ML) 5 ML SYRINGE
INTRAMUSCULAR | Status: AC
  Filled 2020-03-16: qty 5

## 2020-03-16 MED ORDER — PROPOFOL 10 MG/ML IV BOLUS
INTRAVENOUS | Status: DC | PRN
  Administered 2020-03-16: 200 mg via INTRAVENOUS

## 2020-03-16 MED ORDER — THROMBIN 20000 UNITS EX SOLR
CUTANEOUS | Status: AC
  Filled 2020-03-16: qty 20000

## 2020-03-16 MED ORDER — HYDROMORPHONE HCL 1 MG/ML IJ SOLN
0.2500 mg | INTRAMUSCULAR | Status: DC | PRN
  Administered 2020-03-16 (×2): 0.5 mg via INTRAVENOUS

## 2020-03-16 MED ORDER — FLECAINIDE ACETATE 100 MG PO TABS: 100.0000 mg | ORAL_TABLET | Freq: Two times a day (BID) | ORAL | Status: DC | PRN

## 2020-03-16 MED ORDER — SODIUM CHLORIDE 0.9 % IV SOLN: INTRAVENOUS | Status: DC | PRN

## 2020-03-16 MED ORDER — OXYCODONE HCL 5 MG PO TABS
10.0000 mg | ORAL_TABLET | ORAL | Status: DC | PRN
  Administered 2020-03-16 – 2020-03-18 (×13): 10 mg via ORAL
  Filled 2020-03-16 (×13): qty 2

## 2020-03-16 MED ORDER — HYDROMORPHONE HCL 1 MG/ML IJ SOLN
0.5000 mg | INTRAMUSCULAR | Status: DC | PRN
  Administered 2020-03-16: 0.5 mg via INTRAVENOUS
  Filled 2020-03-16: qty 0.5

## 2020-03-16 MED ORDER — ONDANSETRON HCL 4 MG/2ML IJ SOLN
INTRAMUSCULAR | Status: DC | PRN
  Administered 2020-03-16: 4 mg via INTRAVENOUS

## 2020-03-16 MED ORDER — HYDROCHLOROTHIAZIDE 25 MG PO TABS
12.5000 mg | ORAL_TABLET | Freq: Every day | ORAL | Status: DC
  Administered 2020-03-17: 12.5 mg via ORAL
  Filled 2020-03-16: qty 1

## 2020-03-16 MED ORDER — DILTIAZEM HCL ER COATED BEADS 120 MG PO CP24
120.0000 mg | ORAL_CAPSULE | Freq: Every day | ORAL | Status: DC
  Administered 2020-03-17: 120 mg via ORAL
  Filled 2020-03-16: qty 1

## 2020-03-16 MED ORDER — ROCURONIUM BROMIDE 10 MG/ML (PF) SYRINGE
PREFILLED_SYRINGE | INTRAVENOUS | Status: AC
  Filled 2020-03-16: qty 10

## 2020-03-16 MED ORDER — CHLORHEXIDINE GLUCONATE 0.12 % MT SOLN
15.0000 mL | Freq: Once | OROMUCOSAL | Status: AC
  Administered 2020-03-16: 15 mL via OROMUCOSAL
  Filled 2020-03-16: qty 15

## 2020-03-16 MED ORDER — 0.9 % SODIUM CHLORIDE (POUR BTL) OPTIME
TOPICAL | Status: DC | PRN
  Administered 2020-03-16: 1000 mL

## 2020-03-16 MED ORDER — CEFAZOLIN SODIUM-DEXTROSE 2-3 GM-%(50ML) IV SOLR
INTRAVENOUS | Status: DC | PRN
Start: 2020-03-16 — End: 2020-03-16
  Administered 2020-03-16: 2 g via INTRAVENOUS

## 2020-03-16 MED ORDER — ACETAMINOPHEN 650 MG RE SUPP: 650.0000 mg | RECTAL | Status: DC | PRN

## 2020-03-16 MED ORDER — LIDOCAINE-EPINEPHRINE 1 %-1:100000 IJ SOLN
INTRAMUSCULAR | Status: DC | PRN
  Administered 2020-03-16: 9 mL

## 2020-03-16 MED ORDER — ONDANSETRON HCL 4 MG/2ML IJ SOLN: 4.0000 mg | Freq: Four times a day (QID) | INTRAMUSCULAR | Status: DC | PRN

## 2020-03-16 MED ORDER — ACETAMINOPHEN 325 MG PO TABS
650.0000 mg | ORAL_TABLET | ORAL | Status: DC | PRN
  Administered 2020-03-16 – 2020-03-18 (×4): 650 mg via ORAL
  Filled 2020-03-16 (×4): qty 2

## 2020-03-16 MED ORDER — ORAL CARE MOUTH RINSE: 15.0000 mL | Freq: Once | OROMUCOSAL | Status: DC

## 2020-03-16 MED ORDER — ONDANSETRON HCL 4 MG/2ML IJ SOLN
INTRAMUSCULAR | Status: AC
  Filled 2020-03-16: qty 2

## 2020-03-16 MED ORDER — GLYCOPYRROLATE PF 0.2 MG/ML IJ SOSY
PREFILLED_SYRINGE | INTRAMUSCULAR | Status: AC
  Filled 2020-03-16: qty 1

## 2020-03-16 MED ORDER — LIDOCAINE 20MG/ML (2%) 15 ML SYRINGE OPTIME
INTRAMUSCULAR | Status: DC | PRN
  Administered 2020-03-16: 100 mg via INTRAVENOUS

## 2020-03-16 MED ORDER — LACTATED RINGERS IV SOLN: INTRAVENOUS | Status: DC

## 2020-03-16 MED ORDER — LISINOPRIL 20 MG PO TABS
20.0000 mg | ORAL_TABLET | Freq: Every evening | ORAL | Status: DC
  Administered 2020-03-17: 20 mg via ORAL
  Filled 2020-03-16: qty 1

## 2020-03-16 MED ORDER — SUGAMMADEX SODIUM 200 MG/2ML IV SOLN
INTRAVENOUS | Status: DC | PRN
  Administered 2020-03-16: 250 mg via INTRAVENOUS

## 2020-03-16 MED ORDER — LORATADINE 10 MG PO TABS
10.0000 mg | ORAL_TABLET | Freq: Every day | ORAL | Status: DC
  Administered 2020-03-17: 10 mg via ORAL
  Filled 2020-03-16: qty 1

## 2020-03-16 MED ORDER — DEXAMETHASONE SODIUM PHOSPHATE 10 MG/ML IJ SOLN
INTRAMUSCULAR | Status: AC
  Filled 2020-03-16: qty 1

## 2020-03-16 MED ORDER — DEXAMETHASONE SODIUM PHOSPHATE 10 MG/ML IJ SOLN
INTRAMUSCULAR | Status: DC | PRN
  Administered 2020-03-16: 10 mg via INTRAVENOUS

## 2020-03-16 MED ORDER — PHENOL 1.4 % MT LIQD: 1.0000 | OROMUCOSAL | Status: DC | PRN

## 2020-03-16 MED ORDER — LIDOCAINE-EPINEPHRINE 1 %-1:100000 IJ SOLN
INTRAMUSCULAR | Status: AC
  Filled 2020-03-16: qty 1

## 2020-03-16 MED ORDER — MIDAZOLAM HCL 5 MG/5ML IJ SOLN
INTRAMUSCULAR | Status: DC | PRN
  Administered 2020-03-16: 2 mg via INTRAVENOUS

## 2020-03-16 MED ORDER — FENTANYL CITRATE (PF) 250 MCG/5ML IJ SOLN
INTRAMUSCULAR | Status: AC
  Filled 2020-03-16: qty 5

## 2020-03-16 MED ORDER — FLUTICASONE PROPIONATE 50 MCG/ACT NA SUSP
1.0000 | Freq: Every day | NASAL | Status: DC | PRN
  Filled 2020-03-16: qty 16

## 2020-03-16 MED ORDER — APIXABAN 5 MG PO TABS: 5.0000 mg | ORAL_TABLET | Freq: Two times a day (BID) | ORAL | Status: DC

## 2020-03-16 MED ORDER — PROPOFOL 10 MG/ML IV BOLUS
INTRAVENOUS | Status: AC
  Filled 2020-03-16: qty 20

## 2020-03-16 MED ORDER — OLOPATADINE HCL 0.1 % OP SOLN
1.0000 [drp] | Freq: Two times a day (BID) | OPHTHALMIC | Status: DC
  Administered 2020-03-16 – 2020-03-17 (×3): 1 [drp] via OPHTHALMIC
  Filled 2020-03-16: qty 5

## 2020-03-16 MED ORDER — ORAL CARE MOUTH RINSE: 15.0000 mL | Freq: Once | OROMUCOSAL | Status: AC

## 2020-03-16 MED ORDER — GLYCOPYRROLATE PF 0.2 MG/ML IJ SOSY
PREFILLED_SYRINGE | INTRAMUSCULAR | Status: DC | PRN
Start: 2020-03-16 — End: 2020-03-16
  Administered 2020-03-16 (×2): .1 mg via INTRAVENOUS

## 2020-03-16 MED ORDER — PHENYLEPHRINE 40 MCG/ML (10ML) SYRINGE FOR IV PUSH (FOR BLOOD PRESSURE SUPPORT)
PREFILLED_SYRINGE | INTRAVENOUS | Status: DC | PRN
  Administered 2020-03-16: 80 ug via INTRAVENOUS

## 2020-03-16 MED ORDER — PANTOPRAZOLE SODIUM 40 MG PO TBEC: 40.0000 mg | DELAYED_RELEASE_TABLET | Freq: Every evening | ORAL | Status: DC

## 2020-03-16 MED ORDER — CHLORHEXIDINE GLUCONATE 0.12 % MT SOLN: 15.0000 mL | Freq: Once | OROMUCOSAL | Status: DC

## 2020-03-16 MED ORDER — CEFAZOLIN SODIUM-DEXTROSE 2-4 GM/100ML-% IV SOLN
INTRAVENOUS | Status: AC
  Filled 2020-03-16: qty 100

## 2020-03-16 MED ORDER — ALUM & MAG HYDROXIDE-SIMETH 200-200-20 MG/5ML PO SUSP: 30.0000 mL | Freq: Four times a day (QID) | ORAL | Status: DC | PRN

## 2020-03-16 MED ORDER — MIDAZOLAM HCL 2 MG/2ML IJ SOLN
INTRAMUSCULAR | Status: AC
  Filled 2020-03-16: qty 2

## 2020-03-16 MED ORDER — EPHEDRINE SULFATE-NACL 50-0.9 MG/10ML-% IV SOSY
PREFILLED_SYRINGE | INTRAVENOUS | Status: DC | PRN
  Administered 2020-03-16 (×4): 10 mg via INTRAVENOUS

## 2020-03-16 MED ORDER — FENTANYL CITRATE (PF) 100 MCG/2ML IJ SOLN
INTRAMUSCULAR | Status: DC | PRN
  Administered 2020-03-16 (×2): 50 ug via INTRAVENOUS
  Administered 2020-03-16: 100 ug via INTRAVENOUS
  Administered 2020-03-16: 50 ug via INTRAVENOUS

## 2020-03-16 MED ORDER — PHENYLEPHRINE 40 MCG/ML (10ML) SYRINGE FOR IV PUSH (FOR BLOOD PRESSURE SUPPORT)
PREFILLED_SYRINGE | INTRAVENOUS | Status: AC
  Filled 2020-03-16: qty 20

## 2020-03-16 MED ORDER — SODIUM CHLORIDE 0.9% FLUSH
3.0000 mL | Freq: Two times a day (BID) | INTRAVENOUS | Status: DC
  Administered 2020-03-16 – 2020-03-17 (×3): 3 mL via INTRAVENOUS

## 2020-03-16 MED ORDER — POTASSIUM CHLORIDE CRYS ER 20 MEQ PO TBCR
40.0000 meq | EXTENDED_RELEASE_TABLET | Freq: Every evening | ORAL | Status: DC
  Administered 2020-03-16 – 2020-03-17 (×2): 40 meq via ORAL
  Filled 2020-03-16 (×2): qty 2

## 2020-03-16 MED ORDER — TRAMADOL HCL 50 MG PO TABS: 50.0000 mg | ORAL_TABLET | Freq: Two times a day (BID) | ORAL | Status: DC | PRN

## 2020-03-16 MED ORDER — TAMSULOSIN HCL 0.4 MG PO CAPS
0.4000 mg | ORAL_CAPSULE | Freq: Every day | ORAL | Status: DC
  Administered 2020-03-16 – 2020-03-17 (×2): 0.4 mg via ORAL
  Filled 2020-03-16 (×2): qty 1

## 2020-03-16 MED ORDER — CYCLOBENZAPRINE HCL 10 MG PO TABS
10.0000 mg | ORAL_TABLET | Freq: Three times a day (TID) | ORAL | Status: DC | PRN
  Administered 2020-03-16 – 2020-03-18 (×4): 10 mg via ORAL
  Filled 2020-03-16 (×4): qty 1

## 2020-03-16 MED ORDER — CEFAZOLIN SODIUM-DEXTROSE 2-4 GM/100ML-% IV SOLN
2.0000 g | Freq: Three times a day (TID) | INTRAVENOUS | Status: DC
  Administered 2020-03-16 – 2020-03-18 (×5): 2 g via INTRAVENOUS
  Filled 2020-03-16 (×5): qty 100

## 2020-03-16 MED ORDER — POTASSIUM CHLORIDE CRYS ER 20 MEQ PO TBCR
20.0000 meq | EXTENDED_RELEASE_TABLET | Freq: Every morning | ORAL | Status: DC
  Administered 2020-03-17: 20 meq via ORAL
  Filled 2020-03-16: qty 1

## 2020-03-16 MED ORDER — METOPROLOL TARTRATE 25 MG PO TABS
25.0000 mg | ORAL_TABLET | Freq: Two times a day (BID) | ORAL | Status: DC
  Administered 2020-03-16 – 2020-03-17 (×3): 25 mg via ORAL
  Filled 2020-03-16 (×3): qty 1

## 2020-03-16 MED ORDER — MENTHOL 3 MG MT LOZG: 1.0000 | LOZENGE | OROMUCOSAL | Status: DC | PRN

## 2020-03-16 MED ORDER — ALBUMIN HUMAN 5 % IV SOLN
INTRAVENOUS | Status: DC | PRN
Start: 2020-03-16 — End: 2020-03-16

## 2020-03-16 MED ORDER — POTASSIUM CHLORIDE CRYS ER 20 MEQ PO TBCR: 20.0000 meq | EXTENDED_RELEASE_TABLET | ORAL | Status: DC

## 2020-03-16 MED ORDER — SODIUM CHLORIDE 0.9% FLUSH: 3.0000 mL | INTRAVENOUS | Status: DC | PRN

## 2020-03-16 MED ORDER — MONTELUKAST SODIUM 10 MG PO TABS
10.0000 mg | ORAL_TABLET | Freq: Every day | ORAL | Status: DC
  Administered 2020-03-16 – 2020-03-17 (×2): 10 mg via ORAL
  Filled 2020-03-16 (×2): qty 1

## 2020-03-16 MED ORDER — CELECOXIB 200 MG PO CAPS
200.0000 mg | ORAL_CAPSULE | Freq: Every day | ORAL | Status: DC | PRN
  Administered 2020-03-17: 200 mg via ORAL
  Filled 2020-03-16: qty 1

## 2020-03-16 MED ORDER — ROCURONIUM BROMIDE 10 MG/ML (PF) SYRINGE
PREFILLED_SYRINGE | INTRAVENOUS | Status: DC | PRN
  Administered 2020-03-16: 70 mg via INTRAVENOUS
  Administered 2020-03-16: 10 mg via INTRAVENOUS
  Administered 2020-03-16 (×3): 20 mg via INTRAVENOUS

## 2020-03-16 MED ORDER — DIPHENHYDRAMINE HCL 50 MG/ML IJ SOLN
INTRAMUSCULAR | Status: AC
  Filled 2020-03-16: qty 1

## 2020-03-16 MED ORDER — PANTOPRAZOLE SODIUM 40 MG PO TBEC
40.0000 mg | DELAYED_RELEASE_TABLET | Freq: Every day | ORAL | Status: DC
  Administered 2020-03-16 – 2020-03-17 (×2): 40 mg via ORAL
  Filled 2020-03-16 (×2): qty 1

## 2020-03-16 MED ORDER — ONDANSETRON HCL 4 MG PO TABS: 4.0000 mg | ORAL_TABLET | Freq: Four times a day (QID) | ORAL | Status: DC | PRN

## 2020-03-16 MED ORDER — BUPIVACAINE LIPOSOME 1.3 % IJ SUSP
INTRAMUSCULAR | Status: DC | PRN
  Administered 2020-03-16: 20 mL

## 2020-03-16 MED ORDER — SODIUM CHLORIDE 0.9 % IV SOLN
INTRAVENOUS | Status: DC | PRN
  Administered 2020-03-16: 500 mL

## 2020-03-16 MED ORDER — SODIUM CHLORIDE 0.9 % IV SOLN
250.0000 mL | INTRAVENOUS | Status: DC
  Administered 2020-03-16: 250 mL via INTRAVENOUS

## 2020-03-16 MED ORDER — SODIUM CHLORIDE 0.9 % IV SOLN: 250.0000 mL | INTRAVENOUS | Status: DC

## 2020-03-16 SURGICAL SUPPLY — 79 items
BAG DECANTER FOR FLEXI CONT (MISCELLANEOUS) ×2 IMPLANT
BENZOIN TINCTURE PRP APPL 2/3 (GAUZE/BANDAGES/DRESSINGS) ×2 IMPLANT
BIT DRILL 5.0/4.0 (BIT) IMPLANT
BLADE CLIPPER SURG (BLADE) IMPLANT
BLADE SURG 11 STRL SS (BLADE) ×2 IMPLANT
BONE VIVIGEN FORMABLE 5.4CC (Bone Implant) ×2 IMPLANT
BUR CUTTER 7.0 ROUND (BURR) ×2 IMPLANT
BUR MATCHSTICK NEURO 3.0 LAGG (BURR) ×2 IMPLANT
CANISTER SUCT 3000ML PPV (MISCELLANEOUS) ×2 IMPLANT
CAP LOCKING (Cap) ×8 IMPLANT
CAP LOCKING 5.5 CREO (Cap) IMPLANT
CARTRIDGE OIL MAESTRO DRILL (MISCELLANEOUS) ×1 IMPLANT
CNTNR URN SCR LID CUP LEK RST (MISCELLANEOUS) ×1 IMPLANT
CONT SPEC 4OZ STRL OR WHT (MISCELLANEOUS) ×1
COVER BACK TABLE 60X90IN (DRAPES) ×2 IMPLANT
COVER WAND RF STERILE (DRAPES) ×1 IMPLANT
DECANTER SPIKE VIAL GLASS SM (MISCELLANEOUS) ×1 IMPLANT
DERMABOND ADVANCED (GAUZE/BANDAGES/DRESSINGS) ×1
DERMABOND ADVANCED .7 DNX12 (GAUZE/BANDAGES/DRESSINGS) ×1 IMPLANT
DIFFUSER DRILL AIR PNEUMATIC (MISCELLANEOUS) ×2 IMPLANT
DRAPE C-ARM 42X72 X-RAY (DRAPES) ×2 IMPLANT
DRAPE C-ARMOR (DRAPES) ×1 IMPLANT
DRAPE HALF SHEET 40X57 (DRAPES) IMPLANT
DRAPE LAPAROTOMY 100X72X124 (DRAPES) ×2 IMPLANT
DRAPE SURG 17X23 STRL (DRAPES) ×2 IMPLANT
DRILL 5.0/4.0 (BIT) ×2
DRSG OPSITE 4X5.5 SM (GAUZE/BANDAGES/DRESSINGS) ×2 IMPLANT
DRSG OPSITE POSTOP 4X10 (GAUZE/BANDAGES/DRESSINGS) ×1 IMPLANT
DRSG OPSITE POSTOP 4X6 (GAUZE/BANDAGES/DRESSINGS) ×2 IMPLANT
DURAPREP 26ML APPLICATOR (WOUND CARE) ×2 IMPLANT
ELECT REM PT RETURN 9FT ADLT (ELECTROSURGICAL) ×2
ELECTRODE REM PT RTRN 9FT ADLT (ELECTROSURGICAL) ×1 IMPLANT
EVACUATOR 3/16  PVC DRAIN (DRAIN) ×1
EVACUATOR 3/16 PVC DRAIN (DRAIN) ×1 IMPLANT
GAUZE 4X4 16PLY RFD (DISPOSABLE) ×1 IMPLANT
GAUZE SPONGE 4X4 12PLY STRL (GAUZE/BANDAGES/DRESSINGS) ×2 IMPLANT
GLOVE BIO SURGEON STRL SZ7 (GLOVE) IMPLANT
GLOVE BIO SURGEON STRL SZ8 (GLOVE) ×4 IMPLANT
GLOVE BIOGEL PI IND STRL 7.0 (GLOVE) IMPLANT
GLOVE BIOGEL PI INDICATOR 7.0 (GLOVE)
GLOVE EXAM NITRILE XL STR (GLOVE) IMPLANT
GLOVE INDICATOR 8.5 STRL (GLOVE) ×4 IMPLANT
GOWN STRL REUS W/ TWL LRG LVL3 (GOWN DISPOSABLE) IMPLANT
GOWN STRL REUS W/ TWL XL LVL3 (GOWN DISPOSABLE) ×2 IMPLANT
GOWN STRL REUS W/TWL 2XL LVL3 (GOWN DISPOSABLE) IMPLANT
GOWN STRL REUS W/TWL LRG LVL3 (GOWN DISPOSABLE)
GOWN STRL REUS W/TWL XL LVL3 (GOWN DISPOSABLE) ×2
GRAFT BNE MATRIX VG FRMBL MD 5 (Bone Implant) IMPLANT
HEMOSTAT POWDER KIT SURGIFOAM (HEMOSTASIS) IMPLANT
KIT BASIN OR (CUSTOM PROCEDURE TRAY) ×2 IMPLANT
KIT INFUSE SMALL (Orthopedic Implant) ×1 IMPLANT
KIT POSITION SURG JACKSON T1 (MISCELLANEOUS) ×2 IMPLANT
KIT TURNOVER KIT B (KITS) ×2 IMPLANT
MILL MEDIUM DISP (BLADE) ×2 IMPLANT
NDL HYPO 21X1.5 SAFETY (NEEDLE) ×1 IMPLANT
NDL HYPO 25X1 1.5 SAFETY (NEEDLE) ×1 IMPLANT
NEEDLE HYPO 21X1.5 SAFETY (NEEDLE) ×2 IMPLANT
NEEDLE HYPO 25X1 1.5 SAFETY (NEEDLE) ×2 IMPLANT
NS IRRIG 1000ML POUR BTL (IV SOLUTION) ×2 IMPLANT
OIL CARTRIDGE MAESTRO DRILL (MISCELLANEOUS) ×2
PACK LAMINECTOMY NEURO (CUSTOM PROCEDURE TRAY) ×2 IMPLANT
PAD ARMBOARD 7.5X6 YLW CONV (MISCELLANEOUS) ×6 IMPLANT
PATTIES SURGICAL 1X1 (DISPOSABLE) ×1 IMPLANT
ROD CREO 100MM SPINAL (Rod) ×2 IMPLANT
SHAFT CREO 30MM (Neuro Prosthesis/Implant) ×4 IMPLANT
SPACER SUSTAIN RT 12 8D 9X26 (Spacer) ×4 IMPLANT
SPONGE LAP 4X18 RFD (DISPOSABLE) IMPLANT
SPONGE SURGIFOAM ABS GEL 100 (HEMOSTASIS) ×3 IMPLANT
STRIP CLOSURE SKIN 1/2X4 (GAUZE/BANDAGES/DRESSINGS) ×3 IMPLANT
SUT VIC AB 0 CT1 18XCR BRD8 (SUTURE) ×1 IMPLANT
SUT VIC AB 0 CT1 8-18 (SUTURE) ×2
SUT VIC AB 2-0 CT1 18 (SUTURE) ×3 IMPLANT
SUT VIC AB 4-0 PS2 27 (SUTURE) ×2 IMPLANT
SYR 20ML LL LF (SYRINGE) ×2 IMPLANT
TOWEL GREEN STERILE (TOWEL DISPOSABLE) ×2 IMPLANT
TOWEL GREEN STERILE FF (TOWEL DISPOSABLE) ×2 IMPLANT
TRAY FOLEY MTR SLVR 16FR STAT (SET/KITS/TRAYS/PACK) ×2 IMPLANT
TULIP CREP AMP 5.5MM (Orthopedic Implant) ×4 IMPLANT
WATER STERILE IRR 1000ML POUR (IV SOLUTION) ×2 IMPLANT

## 2020-03-16 NOTE — Transfer of Care (Signed)
Immediate Anesthesia Transfer of Care Note  Patient: [name]  Procedure(s) Performed: Posterior Lumbar Interbody Fusion - Lumbar Three-Lumbar Four - Lumbar Two- Lumbar Three (N/A Back)  Patient Location: PACU  Anesthesia Type:General  Level of Consciousness: awake, drowsy and patient cooperative  Airway & Oxygen Therapy: Patient Spontanous Breathing  Post-op Assessment: Report given to RN and Post -op Vital signs reviewed and stable  Post vital signs: Reviewed   Mild hypotension-267ml albumin started. MDA made aware   Last Vitals:  Vitals Value Taken Time  BP 84/54 03/16/20 1651  Temp    Pulse 72 03/16/20 1653  Resp 16 03/16/20 1653  SpO2 94 % 03/16/20 1653  Vitals shown include unvalidated device data.  Last Pain:  Vitals:   03/16/20 1017  TempSrc: Oral  PainSc: 4       Patients Stated Pain Goal: 3 ([id number])  Complications: No apparent anesthesia complications

## 2020-03-16 NOTE — Anesthesia Procedure Notes (Addendum)
Procedure Name: Intubation Date/Time: 03/16/2020 11:54 AM Performed by: Lowella Dell, CRNA Pre-anesthesia Checklist: Patient identified, Emergency Drugs available, Suction available and Patient being monitored Patient Re-evaluated:Patient Re-evaluated prior to induction Oxygen Delivery Method: Circle System Utilized Preoxygenation: Pre-oxygenation with 100% oxygen Induction Type: IV induction Ventilation: Oral airway inserted - appropriate to patient size and Two handed mask ventilation required Laryngoscope Size: Mac and 4 Grade View: Grade I Tube type: Oral Tube size: 7.5 mm Number of attempts: 1 Airway Equipment and Method: Stylet Placement Confirmation: ETT inserted through vocal cords under direct vision,  positive ETCO2 and breath sounds checked- equal and bilateral Secured at: 22 cm Tube secured with: Tape Dental Injury: Teeth and Oropharynx as per pre-operative assessment

## 2020-03-16 NOTE — H&P (Signed)
[name] is an 64 y.o. male.   Chief Complaint: Back and right greater than left leg pain HPI: 64 year old gentleman status post L4-5 transforaminal interbody fusion who over the last several weeks months of progressive worsening back and bilateral leg pain work-up revealed progressive stenosis and spondylosis above his fusion at L2-3 and L3-4.  Due to patient progression of clinical syndrome imaging findings and failed conservative treatment I recommended decompression and stabilization procedure at L2-3 and L3-4 above his previous L4-5 fusion.  I have extensively gone over the risks and benefits of that operation with him as well as perioperative course expectations of outcome and alternatives of surgery and he understood and agreed to proceed forward.  Past Medical History:  Diagnosis Date  . Allergy   . Arthritis   . Cancer ([place])    skin  . Complication of anesthesia    "Woke up with knee surgery"  . Dysrhythmia    Hx of Afib  . Heart murmur   . History of kidney stones    passed 1  . HOH (hard of hearing)    right ear  . Hyperlipidemia   . Hypertension   . Hypokalemia   . Joint pain     Past Surgical History:  Procedure Laterality Date  . BACK SURGERY  2018  . COLONOSCOPY    . HERNIA REPAIR    . JOINT REPLACEMENT Left     x2  . LITHOTRIPSY    . [place] SURGERY  47/14/2020  . Lumbar disectomy  01/18/2019  . TRANSFORAMINAL LUMBAR INTERBODY FUSION (TLIF) WITH PEDICLE SCREW FIXATION 1 LEVEL N/A 06/12/2019   Procedure: LUMBAR FOUR-FIVE TRANSFORAMINAL LUMBAR INTERBODY FUSION (TLIF) WITH PEDICLE SCREW FIXATION;  Surgeon: Kary Kos, MD;  Location: [place];  Service: Neurosurgery;  Laterality: N/A;    Family History  Problem Relation Age of Onset  . Cancer Mother   . Cancer Father   . Cancer Brother   . Hyperlipidemia Brother    Social History:  reports that he has quit smoking. He quit smokeless tobacco use about 14 months ago.  His smokeless tobacco use included  chew. He reports current alcohol use. He reports that he does not use drugs.  Allergies:  Allergies  Allergen Reactions  . Mobic [Meloxicam] Palpitations    Medications Prior to Admission  Medication Sig Dispense Refill  . apixaban (ELIQUIS) 5 MG TABS tablet Take 5 mg by mouth 2 (two) times daily.    . celecoxib (CELEBREX) 200 MG capsule Take 1 capsule by mouth once daily (Patient taking differently: Take 200 mg by mouth daily. ) 30 capsule 1  . diltiazem (CARDIZEM CD) 120 MG 24 hr capsule Take 1 capsule (120 mg total) by mouth daily. (Patient taking differently: Take 120 mg by mouth in the morning and at bedtime. ) 90 capsule 1  . fexofenadine (ALLEGRA) 180 MG tablet Take 1 tablet by mouth once daily 90 tablet 0  . flecainide (TAMBOCOR) 100 MG tablet Take 100 mg by mouth 2 (two) times daily as needed (afib episode).     . fluticasone (FLONASE) 50 MCG/ACT nasal spray Use 1 spray(s) in each nostril once daily (Patient taking differently: Place 1 spray into both nostrils daily as needed for allergies. ) 16 g 11  . hydrochlorothiazide (HYDRODIURIL) 12.5 MG tablet Take 1 tablet by mouth once daily (Patient taking differently: Take 12.5 mg by mouth daily. ) 90 tablet 0  . lisinopril (ZESTRIL) 20 MG tablet Take 1 tablet (20  mg total) by mouth every evening. Dr Nehemiah Massed 90 tablet 1  . metoprolol tartrate (LOPRESSOR) 50 MG tablet Take 25 mg by mouth 2 (two) times daily.     . montelukast (SINGULAIR) 10 MG tablet Take 1 tablet by mouth once daily (Patient taking differently: Take 10 mg by mouth at bedtime. ) 90 tablet 0  . Multiple Vitamins-Minerals (MULTIVITAMIN WITH MINERALS) tablet Take 1 tablet by mouth every evening.     . Olopatadine HCl (PATADAY) 0.2 % SOLN Place 1 drop into both eyes 2 (two) times daily as needed (allergies.).    Marland Kitchen pantoprazole (PROTONIX) 40 MG tablet Take 1 tablet (40 mg total) by mouth daily. (Patient taking differently: Take 40 mg by mouth every evening. ) 90 tablet 1  .  potassium chloride SA (KLOR-CON) 20 MEQ tablet Take 3 tablets (60 mEq total) by mouth daily. (Patient taking differently: Take 20-40 mEq by mouth See admin instructions. Take 1 tablet (20 meq) by mouth in the morning & take 2 tablet (40 meq) by mouth in the evening.) 270 tablet 3  . tamsulosin (FLOMAX) 0.4 MG CAPS capsule TAKE ONE (1) CAPSULE EACH DAY. (Patient taking differently: Take 0.4 mg by mouth daily. ) 90 capsule 3  . traMADol (ULTRAM) 50 MG tablet Take 50 mg by mouth 2 (two) times daily as needed for pain.    Marland Kitchen aspirin 325 MG tablet Take 325 mg by mouth every evening.       No results found for this or any previous visit (from the past 48 hour(s)). No results found.  Review of Systems  Musculoskeletal: Positive for arthralgias and back pain.  Neurological: Positive for numbness.    Blood pressure (!) 172/98, pulse 62, temperature 98.3 F (36.8 C), temperature source Oral, height 6\' 1"  (1.854 m), weight 108.9 kg, SpO2 97 %. Physical Exam  Constitutional: He appears well-developed.  HENT:  Head: Normocephalic.  Eyes: Pupils are equal, round, and reactive to light.  Respiratory: Effort normal.  GI: Soft.  Musculoskeletal:     Cervical back: Normal range of motion.  Neurological: He is alert. He has normal strength. He displays a negative Romberg sign. GCS eye subscore is 4. GCS verbal subscore is 5. GCS motor subscore is 6.  Strength is 5-5 iliopsoas, quads, hamstrings, gastroc, and tibialis, and EHL.  Skin: Skin is warm.     Assessment/Plan [name] presents for L2-3 L3-4 posterior lumbar interbody fusions and extension.  [name] P, MD 03/16/2020, 11:31 AM

## 2020-03-16 NOTE — Op Note (Signed)
Preoperative diagnosis: Herniated nucleus pulposus severe lumbar spinal stenosis and instability L2-3 L3-4  Postoperative diagnosis: Same  Procedure: Decompressive lumbar laminectomy L2-3 L3-4 with complete medial facetectomies radical retinotomies of the L2, L3 and L4 nerve roots in excess and requiring more work than would be needed with a standard interbody fusion  2.  Posterior lumbar interbody fusion L2-3 L3-4 utilizing the globus insert and rotate titanium cages packed with locally harvested autograft mixed with vivigen and BMP  3.  Cortical screw fixation L2-L5 tying into the patient's previous L4-5 cortical screws utilizing the globus Creo modular cortical screw set  Surgeon: Dominica Severin Addy Mcmannis  Assistant: Nash Shearer  Anesthesia: General  EBL: Minimal  HPI: 64 year old gentleman previously undergone L4-5 transforaminal interbody fusion developed aggressive worsening back and right greater than left hip and leg pain work-up revealed herniated disc at L3-4 severe spinal stenosis and instability at L2-3 and L3-4.  Due to patient's progression of clinical syndrome imaging findings failed conservative treatment I recommended reexploration fusion move of hardware L4-5 with decompressive laminectomies and interbody fusions at L2-3 and L3-4.  I extensively went over the risks and benefits of the operation with him as well as perioperative course expectations of outcome and alternatives of surgery and he understood and agreed to proceed forward.  Operative procedure: Patient was brought into the OR was due to general anesthesia positioned prone on the [place] table his back was prepped and draped in routine sterile fashion.  His old incision was opened up and extended cephalad scar tissue and subcutaneous tissue was dissected free and subperiosteal dissection was carried lamina of L2-L3 and exposed hardware L4.  I then remove the spinous process at L2 and L3 performed a central decompression with  complete medial facetectomies at both levels.  There was extensive scar tissue at the 3 4 interface extending dissected through the scar tissue to remove the residual superior aspect of the 4 lamina that was left behind.  There was dissected all the scar tissue off of the L4 nerve root on its at its exit extensively under bit the supra articulating facet to gain access lateral margin of disc base.  I cleaned out both the spaces bilaterally several large fragments removed especially from L34 endplates were prepared inserted cages bilaterally with extensive mount of autograft mixed packed centrally under fluoroscopy.  Then cortical screws were placed under fluoroscopy as well all cortical screws excellent purchase all the foramina reinspected to confirm patency and no migration of graft material I then assembled the heads contoured the rods tightened everything in place compressed the L3 against L4 and the L2 against L3.  Then was copiously irrigated meticulous hemostasis was maintained Gelfoam was ON top of dura Exparel was injected in the fascia.  A large Hemovac drain was placed and the wound was closed in layers with Vicryl in a running 4 subcuticular Dermabond benzoin Steri-Strips and a sterile dressing was applied patient recovery in stable condition.  At the end the case all needle count sponge counts were correct.

## 2020-03-17 DIAGNOSIS — Z791 Long term (current) use of non-steroidal anti-inflammatories (NSAID): Secondary | ICD-10-CM | POA: Diagnosis not present

## 2020-03-17 DIAGNOSIS — I4891 Unspecified atrial fibrillation: Secondary | ICD-10-CM | POA: Diagnosis present

## 2020-03-17 DIAGNOSIS — Z83438 Family history of other disorder of lipoprotein metabolism and other lipidemia: Secondary | ICD-10-CM | POA: Diagnosis not present

## 2020-03-17 DIAGNOSIS — Z87891 Personal history of nicotine dependence: Secondary | ICD-10-CM | POA: Diagnosis not present

## 2020-03-17 DIAGNOSIS — Z886 Allergy status to analgesic agent status: Secondary | ICD-10-CM | POA: Diagnosis not present

## 2020-03-17 DIAGNOSIS — Z7901 Long term (current) use of anticoagulants: Secondary | ICD-10-CM | POA: Diagnosis not present

## 2020-03-17 DIAGNOSIS — Z7982 Long term (current) use of aspirin: Secondary | ICD-10-CM | POA: Diagnosis not present

## 2020-03-17 DIAGNOSIS — E785 Hyperlipidemia, unspecified: Secondary | ICD-10-CM | POA: Diagnosis present

## 2020-03-17 DIAGNOSIS — M48061 Spinal stenosis, lumbar region without neurogenic claudication: Secondary | ICD-10-CM | POA: Diagnosis present

## 2020-03-17 DIAGNOSIS — Z79899 Other long term (current) drug therapy: Secondary | ICD-10-CM | POA: Diagnosis not present

## 2020-03-17 DIAGNOSIS — M79605 Pain in left leg: Secondary | ICD-10-CM | POA: Diagnosis present

## 2020-03-17 DIAGNOSIS — I10 Essential (primary) hypertension: Secondary | ICD-10-CM | POA: Diagnosis present

## 2020-03-17 DIAGNOSIS — M5126 Other intervertebral disc displacement, lumbar region: Secondary | ICD-10-CM | POA: Diagnosis present

## 2020-03-17 DIAGNOSIS — M47816 Spondylosis without myelopathy or radiculopathy, lumbar region: Secondary | ICD-10-CM | POA: Diagnosis present

## 2020-03-17 DIAGNOSIS — Z981 Arthrodesis status: Secondary | ICD-10-CM | POA: Diagnosis not present

## 2020-03-17 DIAGNOSIS — Z85828 Personal history of other malignant neoplasm of skin: Secondary | ICD-10-CM | POA: Diagnosis not present

## 2020-03-17 MED ORDER — OXYCODONE-ACETAMINOPHEN 5-325 MG PO TABS: 1.0000 | ORAL_TABLET | ORAL | 0 refills | Status: DC | PRN

## 2020-03-17 MED ORDER — METHOCARBAMOL 500 MG PO TABS: 500.0000 mg | ORAL_TABLET | Freq: Four times a day (QID) | ORAL | 0 refills | Status: DC

## 2020-03-17 MED ORDER — SENNOSIDES-DOCUSATE SODIUM 8.6-50 MG PO TABS
1.0000 | ORAL_TABLET | Freq: Two times a day (BID) | ORAL | Status: DC
  Administered 2020-03-17 (×2): 1 via ORAL
  Filled 2020-03-17 (×2): qty 1

## 2020-03-17 MED ORDER — CHLORPROMAZINE HCL 25 MG PO TABS
25.0000 mg | ORAL_TABLET | Freq: Four times a day (QID) | ORAL | Status: DC | PRN
  Administered 2020-03-17 (×3): 25 mg via ORAL
  Filled 2020-03-17 (×4): qty 1

## 2020-03-17 NOTE — Discharge Instructions (Signed)

## 2020-03-17 NOTE — Evaluation (Signed)
Occupational Therapy Evaluation Patient Details Name: [name] MRN: [id number] DOB: 08-May-1956 Today's Date: 03/17/2020    History of Present Illness Pt is a 64 yo male s/p decompressive Lumbar lami L2-3, L3-4, PLIF L2-3, L 3-4. PMHx: former smoker, HTN, HLD, PAF (onsent > 5 years ago), hard of hearing (right), murmur, skin cancer, back surgery (lumbar discectomy 2020, L4-5 fusion 06/12/19   Clinical Impression   Pt PTA: Pt was independent with ADL and mobility; driving. Pt currently performing ADL tasks with supervisionA to modified independence; mobility in room without AD and supervisionA. Pt simulating tub transfer with careful sequencing and 0 LOB episodes. Pt limited by pain and activity tolerance. Pt's spouse will be home with pt 24/7. Pt recalling precautions. Pt education on use of reacher for LB ADL. Back handout provided and reviewed ADL in detail. Pt educated on: clothing between brace, never sleep in brace, set an alarm at night for medication, avoid sitting for long periods of time, correct bed positioning for sleeping, correct sequence for bed mobility, avoiding lifting more than 5 pounds and never wash directly over incision. All education is complete and patient indicates understanding. Pt does not require continued OT skilled services. OT signing off.        Follow Up Recommendations  No OT follow up    Equipment Recommendations  None recommended by OT    Recommendations for Other Services       Precautions / Restrictions Precautions Precautions: Back Precaution Booklet Issued: Yes (comment) Precaution Comments: Reviewed back precautions and handout; pt able to recall 3/3 back precautions Required Braces or Orthoses: Spinal Brace Spinal Brace: Lumbar corset;Applied in sitting position Restrictions Weight Bearing Restrictions: No      Mobility Bed Mobility Overal bed mobility: Needs Assistance Bed Mobility: Rolling;Sidelying to Sit Rolling: Modified  independent (Device/Increase time) Sidelying to sit: Modified independent (Device/Increase time)       General bed mobility comments: No cues required; pt using siderail minimally  Transfers Overall transfer level: Needs assistance Equipment used: None Transfers: Sit to/from Stand Sit to Stand: Supervision         General transfer comment: careful with power up    Balance Overall balance assessment: No apparent balance deficits (not formally assessed)                                         ADL either performed or assessed with clinical judgement   ADL Overall ADL's : Needs assistance/impaired Eating/Feeding: Modified independent   Grooming: Modified independent;Standing   Upper Body Bathing: Supervision/ safety;Standing   Lower Body Bathing: Min guard;Sitting/lateral leans;Sit to/from stand   Upper Body Dressing : Modified independent;Standing Upper Body Dressing Details (indicate cue type and reason): donning short and brace with no difficulty Lower Body Dressing: Supervision/safety;Sitting/lateral leans;Sit to/from stand Lower Body Dressing Details (indicate cue type and reason): Pt using figure 4 technique and cues to avoid arching Toilet Transfer: Modified Independent   Toileting- Clothing Manipulation and Hygiene: Modified independent   Tub/ Shower Transfer: Supervision/safety;Stand-pivot;Grab bars Clinical cytogeneticist Details (indicate cue type and reason): Pt sequencing well with simulation of tub transfer with no LOB of phyisical assist required Functional mobility during ADLs: Supervision/safety General ADL Comments: Pt limited by pain. Pt has very supportive spouse in room for session who will be home 24/7. Pt performing ADL routine within back precautions without use of AE.  Vision Baseline Vision/History: Wears glasses Wears Glasses: Reading only Patient Visual Report: No change from baseline Vision Assessment?: No apparent visual  deficits     Perception     Praxis      Pertinent Vitals/Pain Pain Assessment: 0-10 Pain Score: 4  Pain Location: back; incision Pain Descriptors / Indicators: Operative site guarding;Sore Pain Intervention(s): Monitored during session;Repositioned     Hand Dominance Right   Extremity/Trunk Assessment Upper Extremity Assessment Upper Extremity Assessment: Overall WFL for tasks assessed   Lower Extremity Assessment Lower Extremity Assessment: Overall WFL for tasks assessed(able to perform figure 4 technique to cross legs for LB ADL)   Cervical / Trunk Assessment Cervical / Trunk Assessment: Other exceptions Cervical / Trunk Exceptions: s/p spine surgery   Communication Communication Communication: HOH   Cognition Arousal/Alertness: Awake/alert Behavior During Therapy: WFL for tasks assessed/performed Overall Cognitive Status: Within Functional Limits for tasks assessed                                     General Comments  wound vac in place; dressing clean and dry.    Exercises     Shoulder Instructions      Home Living Family/patient expects to be discharged to:: Private residence Living Arrangements: Spouse/significant other Available Help at Discharge: Family;Available 24 hours/day Type of Home: House Home Access: Stairs to enter [employer] of Steps: 5 Entrance Stairs-Rails: Left Home Layout: Two level Alternate Level Stairs-Number of Steps: 12 Alternate Level Stairs-Rails: Can reach both Bathroom Shower/Tub: Teacher, early years/pre: Standard     Home Equipment: Environmental consultant - 2 wheels;Cane - single point;Shower seat;Adaptive equipment Adaptive Equipment: Reacher Additional Comments: Pt must use 2nd level to reach bedroom; pt had high commode on ML      Prior Functioning/Environment Level of Independence: Independent with assistive device(s)        Comments: Pt was ambulating independently prior to surgery, however,  used walking stick for steadying during gait. works as Development worker, [employer]- desk work.        OT Problem List: Decreased activity tolerance;Pain      OT Treatment/Interventions:      OT Goals(Current goals can be found in the care plan section) Acute Rehab OT Goals Patient Stated Goal: get back to golf OT Goal Formulation: With patient  OT Frequency:     Barriers to D/C:            Co-evaluation              AM-PAC OT "6 Clicks" Daily Activity     Outcome Measure Help from another person eating meals?: None Help from another person taking care of personal grooming?: None Help from another person toileting, which includes using toliet, bedpan, or urinal?: None Help from another person bathing (including washing, rinsing, drying)?: A Little Help from another person to put on and taking off regular upper body clothing?: None Help from another person to put on and taking off regular lower body clothing?: None 6 Click Score: 23   End of Session Equipment Utilized During Treatment: Back brace Nurse Communication: Mobility status;Precautions  Activity Tolerance: Patient tolerated treatment well;Patient limited by pain Patient left: in chair;with call bell/phone within reach;with family/visitor present  OT Visit Diagnosis: Muscle weakness (generalized) (M62.81);Pain Pain - part of body: (back)                Time: [id number] OT Time  Calculation (min): 17 min Charges:  OT General Charges $OT Visit: 1 Visit OT Evaluation $OT Eval Moderate Complexity: 1 Mod  Jefferey Pica, OTR/L Acute Rehabilitation Services Pager: [phone] Office: [phone]   Asna Muldrow C 03/17/2020, 10:07 AM

## 2020-03-17 NOTE — Discharge Summary (Addendum)
Physician Discharge Summary  Patient ID: [name] MRN: [id number] DOB/AGE: [birth year] 64 y.o. Estimated body mass index is 31.66 kg/m as calculated from the following:   Height as of this encounter: 6\' 1"  (1.854 m).   Weight as of this encounter: 108.9 kg.   Admit date: 03/16/2020 Discharge date: 03/17/2020  Admission Diagnoses:spinal stenosis L23, L34  Discharge Diagnoses: same Active Problems:   Herniated nucleus pulposus, lumbar   Spinal stenosis of lumbar region   Discharged Condition: good  Hospital Course: Patient ws admitted and underwent a PLIF at L23, L34 did very well postop . Was ambulating and voiding and tolerating a regular diet.  Patient will be observed for drain output to be slowed down and discharged later today if drain output decreased  Consults: Significant Diagnostic Studies: Treatments:PLIF L23, L34 Discharge Exam: Blood pressure (!) 117/59, pulse 86, temperature 98.1 F (36.7 C), temperature source Oral, resp. rate 17, height 6\' 1"  (1.854 m), weight 108.9 kg, SpO2 97 %. 5/5 strength, wound dry  Disposition: home  condition the same overnight    Signed: CRAM,GARY P 03/17/2020, 7:52 AM

## 2020-03-17 NOTE — Evaluation (Signed)
Physical Therapy Evaluation Patient Details Name: [name] MRN: [id number] DOB: 1956-03-28 Today's Date: 03/17/2020   History of Present Illness  Pt is a 64 yo male s/p decompressive Lumbar lami L2-3, L3-4, PLIF L2-3, L 3-4. PMHx: former smoker, HTN, HLD, PAF (onsent > 5 years ago), hard of hearing (right), murmur, skin cancer, back surgery (lumbar discectomy 2020, L4-5 fusion 06/12/19  Clinical Impression  Patient presents with pain and post surgical deficits s/p above surgery. Pt independent and working PTA. Lives with spouse. Today, pt tolerated bed mobility, transfers, gait and stair training with supervision-Mod I for safety. Education re: back precautions, log roll technique, positioning, walking program, brace wearing etc. Pt does not require skilled therapy services. All education completed. Discharge from therapy.     Follow Up Recommendations No PT follow up    Equipment Recommendations  None recommended by PT    Recommendations for Other Services       Precautions / Restrictions Precautions Precautions: Back Precaution Booklet Issued: Yes (comment) Precaution Comments: Reviewed back precautions and handout, hemovac Required Braces or Orthoses: Spinal Brace Spinal Brace: Lumbar corset;Applied in sitting position Restrictions Weight Bearing Restrictions: No      Mobility  Bed Mobility Overal bed mobility: Needs Assistance Bed Mobility: Rolling;Sidelying to Sit Rolling: Modified independent (Device/Increase time) Sidelying to sit: Modified independent (Device/Increase time)       General bed mobility comments: HOB flat, no use of rails to simulate home. Cues for log roll technique.  Transfers Overall transfer level: Needs assistance Equipment used: None Transfers: Sit to/from Stand Sit to Stand: Supervision         General transfer comment: Supervision for safety. Slow to rise with some difficulty in mid range but no assist needed. transferred to  chair post ambulation.  Ambulation/Gait Ambulation/Gait assistance: Supervision;Modified independent (Device/Increase time) Gait Distance (Feet): 400 Feet Assistive device: None Gait Pattern/deviations: Step-through pattern;Decreased stride length;Wide base of support   Gait velocity interpretation: 1.31 - 2.62 ft/sec, indicative of limited community ambulator General Gait Details: Slow, steady gait, walking close to rail but did not need it for support.  Stairs Stairs: Yes Stairs assistance: Modified independent (Device/Increase time) Stair Management: One rail Left;Alternating pattern Number of Stairs: 13 General stair comments: Cues for safety.  Wheelchair Mobility    Modified Rankin (Stroke Patients Only)       Balance Overall balance assessment: No apparent balance deficits (not formally assessed)                                           Pertinent Vitals/Pain Pain Assessment: 0-10 Pain Score: 4  Pain Location: back Pain Descriptors / Indicators: Operative site guarding;Sore Pain Intervention(s): Repositioned;Monitored during session    [place] expects to be discharged to:: Private residence Living Arrangements: Spouse/significant other Available Help at Discharge: Family;Available 24 hours/day Type of Home: House Home Access: Stairs to enter Entrance Stairs-Rails: Left Entrance Stairs-Number of Steps: 5 Home Layout: Two level Home Equipment: Walker - 2 wheels;Cane - single point;Shower seat      Prior Function Level of Independence: Independent with assistive device(s)         Comments: Pt was ambulating independently prior to surgery, however, used walking stick for steadying during gait. works as Development worker, [employer]- desk work.     Hand Dominance        Extremity/Trunk Assessment   Upper Extremity  Assessment Upper Extremity Assessment: Defer to OT evaluation    Lower Extremity Assessment Lower Extremity  Assessment: Overall WFL for tasks assessed(Sensation WFLs)    Cervical / Trunk Assessment Cervical / Trunk Assessment: Other exceptions Cervical / Trunk Exceptions: s/p spine surgery  Communication   Communication: HOH  Cognition Arousal/Alertness: Awake/alert Behavior During Therapy: WFL for tasks assessed/performed Overall Cognitive Status: Within Functional Limits for tasks assessed                                        General Comments General comments (skin integrity, edema, etc.): Dressing bandage- clean dry and intact    Exercises     Assessment/Plan    PT Assessment Patent does not need any further PT services  PT Problem List         PT Treatment Interventions      PT Goals (Current goals can be found in the Care Plan section)  Acute Rehab PT Goals Patient Stated Goal: get back to golf PT Goal Formulation: All assessment and education complete, DC therapy    Frequency     Barriers to discharge        Co-evaluation               AM-PAC PT "6 Clicks" Mobility  Outcome Measure Help needed turning from your back to your side while in a flat bed without using bedrails?: None Help needed moving from lying on your back to sitting on the side of a flat bed without using bedrails?: None Help needed moving to and from a bed to a chair (including a wheelchair)?: None Help needed standing up from a chair using your arms (e.g., wheelchair or bedside chair)?: None Help needed to walk in hospital room?: None Help needed climbing 3-5 steps with a railing? : A Little 6 Click Score: 23    End of Session Equipment Utilized During Treatment: Back brace Activity Tolerance: Patient tolerated treatment well Patient left: in chair;with call bell/phone within reach Nurse Communication: Mobility status PT Visit Diagnosis: Pain;Difficulty in walking, not elsewhere classified (R26.2) Pain - part of body: (back)    Time: [id number] PT Time Calculation  (min) (ACUTE ONLY): 20 min   Charges:   PT Evaluation $PT Eval Moderate Complexity: 1 Mod          Marisa Severin, PT, DPT Acute Rehabilitation Services Pager [phone] Office [place] 03/17/2020, 8:56 AM

## 2020-03-18 MED ORDER — CHLORPROMAZINE HCL 25 MG PO TABS: 25.0000 mg | ORAL_TABLET | Freq: Four times a day (QID) | ORAL | 0 refills | Status: DC | PRN

## 2020-03-18 NOTE — Anesthesia Postprocedure Evaluation (Signed)
Anesthesia Post Note  Patient: [name]  Procedure(s) Performed: Posterior Lumbar Interbody Fusion - Lumbar Three-Lumbar Four - Lumbar Two- Lumbar Three (N/A Back)     Patient location during evaluation: PACU Anesthesia Type: General Level of consciousness: awake and alert Pain management: pain level controlled Vital Signs Assessment: post-procedure vital signs reviewed and stable Respiratory status: spontaneous breathing, nonlabored ventilation, respiratory function stable and patient connected to nasal cannula oxygen Cardiovascular status: blood pressure returned to baseline and stable Postop Assessment: no apparent nausea or vomiting Anesthetic complications: no    Last Vitals:  Vitals:   03/18/20 0722 03/18/20 0724  BP: (!) 143/96 (!) 143/96  Pulse: 63 71  Resp: 17 17  Temp: 36.9 C 36.9 C  SpO2: 98% 96%    Last Pain:  Vitals:   03/18/20 0724  TempSrc: Oral  PainSc:                  [name] S

## 2020-03-18 NOTE — Progress Notes (Signed)
Patient alert and oriented. MAE's well, no s/s of infection noted at incision site. Patient discharged home with spouse. Discharge instructions given to patient and spouse. Both stated understanding of instructions given. Patient has an appointment with MD in 10 days.

## 2020-03-19 MED FILL — Sodium Chloride IV Soln 0.9%: INTRAVENOUS | Qty: 1000 | Status: AC

## 2020-03-19 MED FILL — Heparin Sodium (Porcine) Inj 1000 Unit/ML: INTRAMUSCULAR | Qty: 30 | Status: AC

## 2020-03-31 ENCOUNTER — Other Ambulatory Visit: Payer: Self-pay | Admitting: Family Medicine

## 2020-03-31 DIAGNOSIS — M1712 Unilateral primary osteoarthritis, left knee: Secondary | ICD-10-CM

## 2020-03-31 DIAGNOSIS — M5136 Other intervertebral disc degeneration, lumbar region: Secondary | ICD-10-CM

## 2020-03-31 NOTE — Telephone Encounter (Signed)
Requested Prescriptions  Pending Prescriptions Disp Refills  . celecoxib (CELEBREX) 200 MG capsule [Pharmacy Med Name: Celecoxib 200 MG Oral Capsule] 30 capsule 0    Sig: Take 1 capsule by mouth once daily     Analgesics:  COX2 Inhibitors Passed - 03/31/2020  9:45 AM      Passed - HGB in normal range and within 360 days    Hemoglobin  Date Value Ref Range Status  03/13/2020 14.1 13.0 - 17.0 g/dL Final  12/03/2018 15.2 13.0 - 17.7 g/dL Final         Passed - Cr in normal range and within 360 days    Creatinine  Date Value Ref Range Status  12/04/2013 1.31 (H) 0.60 - 1.30 mg/dL Final   Creatinine, Ser  Date Value Ref Range Status  03/13/2020 1.03 0.61 - 1.24 mg/dL Final         Passed - Patient is not pregnant      Passed - Valid encounter within last 12 months    Recent Outpatient Visits          6 months ago Essential hypertension   [place], [place]   7 months ago Generalized edema   [facility] Juline Patch, MD   7 months ago Essential hypertension   [place], MD   11 months ago Viral syndrome   [facility] Juline Patch, MD   1 year ago Urinary tract infection associated with indwelling urethral catheter, initial encounter [facility])   [facility] Juline Patch, MD      Future Appointments            In 6 days Juline Patch, MD [facility], [place]   In 5 months [facility] [place], [facility], [place]

## 2020-04-03 ENCOUNTER — Other Ambulatory Visit: Payer: Self-pay | Admitting: Family Medicine

## 2020-04-03 DIAGNOSIS — K219 Gastro-esophageal reflux disease without esophagitis: Secondary | ICD-10-CM

## 2020-04-03 DIAGNOSIS — I10 Essential (primary) hypertension: Secondary | ICD-10-CM

## 2020-04-06 ENCOUNTER — Other Ambulatory Visit: Payer: Self-pay

## 2020-04-06 ENCOUNTER — Encounter: Payer: Self-pay | Admitting: Family Medicine

## 2020-04-06 ENCOUNTER — Ambulatory Visit: Payer: Medicare PPO | Admitting: Family Medicine

## 2020-04-06 VITALS — BP 130/100 | HR 84 | Ht 73.0 in | Wt 238.0 lb

## 2020-04-06 DIAGNOSIS — M1712 Unilateral primary osteoarthritis, left knee: Secondary | ICD-10-CM | POA: Diagnosis not present

## 2020-04-06 DIAGNOSIS — I10 Essential (primary) hypertension: Secondary | ICD-10-CM

## 2020-04-06 DIAGNOSIS — M5136 Other intervertebral disc degeneration, lumbar region: Secondary | ICD-10-CM | POA: Diagnosis not present

## 2020-04-06 DIAGNOSIS — J302 Other seasonal allergic rhinitis: Secondary | ICD-10-CM | POA: Diagnosis not present

## 2020-04-06 DIAGNOSIS — E7801 Familial hypercholesterolemia: Secondary | ICD-10-CM

## 2020-04-06 DIAGNOSIS — E876 Hypokalemia: Secondary | ICD-10-CM

## 2020-04-06 DIAGNOSIS — R69 Illness, unspecified: Secondary | ICD-10-CM

## 2020-04-06 DIAGNOSIS — K219 Gastro-esophageal reflux disease without esophagitis: Secondary | ICD-10-CM

## 2020-04-06 MED ORDER — FLUTICASONE PROPIONATE 50 MCG/ACT NA SUSP: NASAL | 1 refills | Status: DC

## 2020-04-06 MED ORDER — PANTOPRAZOLE SODIUM 40 MG PO TBEC: 40.0000 mg | DELAYED_RELEASE_TABLET | Freq: Every day | ORAL | 1 refills | Status: DC

## 2020-04-06 MED ORDER — FEXOFENADINE HCL 180 MG PO TABS: 180.0000 mg | ORAL_TABLET | Freq: Every day | ORAL | 1 refills | Status: DC

## 2020-04-06 MED ORDER — POTASSIUM CHLORIDE CRYS ER 20 MEQ PO TBCR: 60.0000 meq | EXTENDED_RELEASE_TABLET | Freq: Every day | ORAL | 1 refills | Status: DC

## 2020-04-06 MED ORDER — CELECOXIB 200 MG PO CAPS: 200.0000 mg | ORAL_CAPSULE | Freq: Every day | ORAL | 5 refills | Status: DC

## 2020-04-06 MED ORDER — MONTELUKAST SODIUM 10 MG PO TABS: 10.0000 mg | ORAL_TABLET | Freq: Every day | ORAL | 1 refills | Status: DC

## 2020-04-06 NOTE — Progress Notes (Signed)
Date:  04/06/2020   Name:  GLENDALE YOUNGBLOOD   DOB:  02-20-1956   MRN:  [id number]   Chief Complaint: Allergic Rhinitis , Gastroesophageal Reflux, arthritis pain, and hypokalemia  Gastroesophageal Reflux He reports no abdominal pain, no belching, no chest pain, no choking, no coughing, no dysphagia, no early satiety, no globus sensation, no heartburn, no hoarse voice, no nausea, no sore throat, no stridor, no tooth decay, no water brash or no wheezing. This is a chronic problem. The current episode started more than 1 year ago. The problem occurs frequently. The problem has been gradually improving. The symptoms are aggravated by certain foods. Pertinent negatives include no anemia, fatigue, melena, muscle weakness, orthopnea or weight loss. Risk factors include NSAIDs. He has tried a PPI for the symptoms. The treatment provided moderate relief.  Hyperlipidemia This is a chronic problem. The current episode started more than 1 year ago. The problem is controlled. Recent lipid tests were reviewed and are normal. He has no history of chronic renal disease, diabetes, hypothyroidism, liver disease, obesity or nephrotic syndrome. Factors aggravating his hyperlipidemia include thiazides. Pertinent negatives include no chest pain, myalgias or shortness of breath. The current treatment provides moderate improvement of lipids. There are no compliance problems.  Risk factors for coronary artery disease include dyslipidemia and hypertension.  URI  This is a chronic (for allergies) problem. The current episode started more than 1 year ago. The problem has been waxing and waning. There has been no fever. Pertinent negatives include no abdominal pain, chest pain, coughing, diarrhea, dysuria, ear pain, headaches, nausea, neck pain, rash, rhinorrhea, sore throat or wheezing.    Lab Results  Component Value Date   CREATININE 1.03 03/13/2020   BUN 15 03/13/2020   NA 141 03/13/2020   K 3.5 03/13/2020   CL 105  03/13/2020   CO2 26 03/13/2020   Lab Results  Component Value Date   CHOL 215 (H) 08/07/2019   HDL 53 08/07/2019   LDLCALC 151 (H) 08/07/2019   TRIG 64 08/07/2019   CHOLHDL 4.1 08/07/2019   No results found for: TSH No results found for: HGBA1C Lab Results  Component Value Date   WBC 5.9 03/13/2020   HGB 14.1 03/13/2020   HCT 44.3 03/13/2020   MCV 79.4 (L) 03/13/2020   PLT 169 03/13/2020   Lab Results  Component Value Date   ALT 29 01/28/2017   AST 27 01/28/2017   ALKPHOS 39 01/28/2017   BILITOT 0.5 01/28/2017     Review of Systems  Constitutional: Negative for chills, fatigue, fever and weight loss.  HENT: Negative for drooling, ear discharge, ear pain, hoarse voice, rhinorrhea and sore throat.   Respiratory: Negative for cough, choking, shortness of breath and wheezing.   Cardiovascular: Negative for chest pain, palpitations and leg swelling.  Gastrointestinal: Negative for abdominal pain, blood in stool, constipation, diarrhea, dysphagia, heartburn, melena and nausea.  Endocrine: Negative for polydipsia.  Genitourinary: Negative for dysuria, frequency, hematuria and urgency.  Musculoskeletal: Negative for back pain, myalgias, muscle weakness and neck pain.  Skin: Negative for rash.  Allergic/Immunologic: Negative for environmental allergies.  Neurological: Negative for dizziness and headaches.  Hematological: Does not bruise/bleed easily.  Psychiatric/Behavioral: Negative for suicidal ideas. The patient is not nervous/anxious.     Patient Active Problem List   Diagnosis Date Noted  . Herniated nucleus pulposus, lumbar 03/16/2020  . Spinal stenosis of lumbar region 03/16/2020  . HNP (herniated nucleus pulposus), lumbar 06/12/2019  . UTI due  to Klebsiella species 05/08/2019  . Biceps tendonitis on right 07/27/2018  . Primary osteoarthritis of right knee 10/12/2017  . Taking multiple medications for chronic disease 07/30/2015  . Bronchitis 07/01/2015  .  Reactive airway disease 07/01/2015  . Allergic sinusitis 07/01/2015  . Essential hypertension 07/01/2015  . Valvular heart disease 07/01/2015  . Metatarsalgia of left foot 06/10/2015  . [place] arthritis 12/15/2013    Allergies  Allergen Reactions  . Mobic [Meloxicam] Palpitations    Past Surgical History:  Procedure Laterality Date  . BACK SURGERY  2018  . COLONOSCOPY    . HERNIA REPAIR    . JOINT REPLACEMENT Left     x2  . LITHOTRIPSY    . [place] SURGERY  47/14/2020  . Lumbar disectomy  01/18/2019  . TRANSFORAMINAL LUMBAR INTERBODY FUSION (TLIF) WITH PEDICLE SCREW FIXATION 1 LEVEL N/A 06/12/2019   Procedure: LUMBAR FOUR-FIVE TRANSFORAMINAL LUMBAR INTERBODY FUSION (TLIF) WITH PEDICLE SCREW FIXATION;  Surgeon: Kary Kos, MD;  Location: [place];  Service: Neurosurgery;  Laterality: N/A;    Social History   Tobacco Use  . Smoking status: Former Research scientist (life sciences)  . Smokeless tobacco: Former Systems developer    Types: Chew    Quit date: 12/2018  . Tobacco comment: Quit Fall 2020  Vaping Use  . Vaping Use: Never used  Substance Use Topics  . Alcohol use: Yes    Comment: 2 drinks a month  . Drug use: Never     Medication list has been reviewed and updated.  Current Meds  Medication Sig  . apixaban (ELIQUIS) 5 MG TABS tablet Take 5 mg by mouth 2 (two) times daily.  . celecoxib (CELEBREX) 200 MG capsule Take 1 capsule by mouth once daily  . diltiazem (CARDIZEM CD) 120 MG 24 hr capsule Take 1 capsule (120 mg total) by mouth daily. (Patient taking differently: Take 120 mg by mouth in the morning and at bedtime. )  . fluticasone (FLONASE) 50 MCG/ACT nasal spray Use 1 spray(s) in each nostril once daily (Patient taking differently: Place 1 spray into both nostrils daily as needed for allergies. )  . hydrochlorothiazide (HYDRODIURIL) 12.5 MG tablet Take 1 tablet by mouth once daily (Patient taking differently: Take 12.5 mg by mouth daily. )  . lisinopril (ZESTRIL) 20 MG tablet Take 1 tablet by mouth  in the evening  . metoprolol tartrate (LOPRESSOR) 50 MG tablet Take 25 mg by mouth 2 (two) times daily.   . montelukast (SINGULAIR) 10 MG tablet Take 1 tablet by mouth once daily (Patient taking differently: Take 10 mg by mouth at bedtime. )  . Multiple Vitamins-Minerals (MULTIVITAMIN WITH MINERALS) tablet Take 1 tablet by mouth every evening.   . Olopatadine HCl (PATADAY) 0.2 % SOLN Place 1 drop into both eyes 2 (two) times daily as needed (allergies.).  Marland Kitchen pantoprazole (PROTONIX) 40 MG tablet Take 1 tablet by mouth once daily  . potassium chloride SA (KLOR-CON) 20 MEQ tablet Take 3 tablets (60 mEq total) by mouth daily. (Patient taking differently: Take 20-40 mEq by mouth See admin instructions. Take 1 tablet (20 meq) by mouth in the morning & take 2 tablet (40 meq) by mouth in the evening.)  . tamsulosin (FLOMAX) 0.4 MG CAPS capsule TAKE ONE (1) CAPSULE EACH DAY. (Patient taking differently: Take 0.4 mg by mouth daily. )  . traMADol (ULTRAM) 50 MG tablet Take 50 mg by mouth 2 (two) times daily as needed for pain.  . [DISCONTINUED] methocarbamol (ROBAXIN) 500 MG tablet Take 1 tablet (  500 mg total) by mouth 4 (four) times daily.    PHQ 2/9 Scores 04/06/2020 10/02/2019 09/03/2019 08/07/2019  PHQ - 2 Score 0 0 0 0  PHQ- 9 Score 0 0 0 0    GAD 7 : Generalized Anxiety Score 04/06/2020 09/03/2019  Nervous, Anxious, on Edge 0 0  Control/stop worrying 0 0  Worry too much - different things 0 0  Trouble relaxing 0 0  Restless 0 0  Easily annoyed or irritable 0 0  Afraid - awful might happen 0 0  Total GAD 7 Score 0 0    BP Readings from Last 3 Encounters:  04/06/20 (!) 130/100  03/18/20 (!) 143/96  03/13/20 (!) 158/104    Physical Exam Vitals and nursing note reviewed.  HENT:     Head: Normocephalic.     Right Ear: Tympanic membrane, ear canal and external ear normal.     Left Ear: Tympanic membrane, ear canal and external ear normal.     Nose: Nose normal. No congestion or rhinorrhea.       Mouth/Throat:     Mouth: Mucous membranes are moist.  Eyes:     General: No scleral icterus.       Right eye: No discharge.        Left eye: No discharge.     Conjunctiva/sclera: Conjunctivae normal.     Pupils: Pupils are equal, round, and reactive to light.  Neck:     Thyroid: No thyromegaly.     Vascular: No JVD.     Trachea: No tracheal deviation.  Cardiovascular:     Rate and Rhythm: Normal rate and regular rhythm.     Heart sounds: S1 normal and S2 normal. Murmur heard.  Systolic murmur is present with a grade of 1/6.  No friction rub. No gallop. No S3 or S4 sounds.   Pulmonary:     Effort: No respiratory distress.     Breath sounds: Normal breath sounds. No decreased breath sounds, wheezing, rhonchi or rales.  Abdominal:     General: Bowel sounds are normal.     Palpations: Abdomen is soft. There is no mass.     Tenderness: There is no abdominal tenderness. There is no right CVA tenderness, left CVA tenderness, guarding or rebound.  Musculoskeletal:        General: No tenderness. Normal range of motion.     Cervical back: Normal range of motion and neck supple.     Right lower leg: No edema.     Left lower leg: No edema.  Lymphadenopathy:     Cervical: No cervical adenopathy.  Skin:    General: Skin is warm.     Findings: No rash.  Neurological:     Mental Status: He is alert and oriented to person, place, and time.     Cranial Nerves: No cranial nerve deficit.     Deep Tendon Reflexes: Reflexes are normal and symmetric.  Psychiatric:        Mood and Affect: Mood normal.     Wt Readings from Last 3 Encounters:  04/06/20 238 lb (108 kg)  03/16/20 240 lb (108.9 kg)  03/13/20 245 lb 12.8 oz (111.5 kg)    BP (!) 130/100   Pulse 84   Ht 6\' 1"  (1.854 m)   Wt 238 lb (108 kg)   BMI 31.40 kg/m   Assessment and Plan:  1. Primary osteoarthritis of left knee Chronic.  Controlled.  Stable.  Primarily has issues with his left knee  and both MP joints of thumbs.   Continue Celebrex 200 mg once a day. - celecoxib (CELEBREX) 200 MG capsule; Take 1 capsule (200 mg total) by mouth daily.  Dispense: 30 capsule; Refill: 5  2. Degenerative disc disease, lumbar Chronic.  Controlled.  As above. - celecoxib (CELEBREX) 200 MG capsule; Take 1 capsule (200 mg total) by mouth daily.  Dispense: 30 capsule; Refill: 5  3. Other seasonal allergic rhinitis Chronic.  Episodic.  Stable at this time.  Controlled with fexofenadine 180 mg daily as well as Flonase nasal spray. - fexofenadine (ALLEGRA) 180 MG tablet; Take 1 tablet (180 mg total) by mouth daily.  Dispense: 90 tablet; Refill: 1 - fluticasone (FLONASE) 50 MCG/ACT nasal spray; Use 1 spray(s) in each nostril once daily  Dispense: 16 g; Refill: 1  4. Seasonal allergic rhinitis, unspecified trigger As noted - montelukast (SINGULAIR) 10 MG tablet; Take 1 tablet (10 mg total) by mouth at bedtime.  Dispense: 90 tablet; Refill: 1  5. Gastroesophageal reflux disease, unspecified whether esophagitis present Chronic.  Controlled.  Stable.  Continue pantoprazole 40 mg once a day. - pantoprazole (PROTONIX) 40 MG tablet; Take 1 tablet (40 mg total) by mouth daily.  Dispense: 90 tablet; Refill: 1  6. Hypokalemia Iatrogenic.  Controlled.  Stable.  Secondary to hydrochlorothiazide potassium depletion.  We will continue potassium chloride sustained release 3 tablets 60 mg total daily. - potassium chloride SA (KLOR-CON) 20 MEQ tablet; Take 3 tablets (60 mEq total) by mouth daily.  Dispense: 270 tablet; Refill: 1  7. Familial hypercholesterolemia Chronic.  Uncontrolled.  Stable.  He is on multiple occasions attempted to start on statin which patient has been unable to tolerate due to flulike symptoms.  We have discussed the possibility of alternatives.  Patient will discuss with Dr. Jose Persia whether or not he would be a candidate for Vascepa in the this could be obtained with prior authorization or initiation of Zetia. - Lipid Panel  With LDL/HDL Ratio  8. Essential hypertension Chronic.  Controlled.  Stable.  This is followed by Dr. Nehemiah Massed.  Patient has noted elevations of his diastolic pressures which would indicate that this may be a half-life concern.  I have instructed the patient to increase his metoprolol to a full tablet twice a day rather than a half a tablet twice a day.  Patient will have this checked at his cardiology appointment and Dr. Nehemiah Massed will decide whether or not to continue at this dosing. - Renal Function Panel  9. Taking medication for chronic disease Patient is on medications that we observe for concerns with hepatic transaminases and we will check these today. - Hepatic function panel

## 2020-04-08 LAB — RENAL FUNCTION PANEL
Albumin: 4.2 g/dL (ref 3.8–4.8)
BUN/Creatinine Ratio: 15 (ref 10–24)
BUN: 13 mg/dL (ref 8–27)
CO2: 22 mmol/L (ref 20–29)
Calcium: 8.9 mg/dL (ref 8.6–10.2)
Chloride: 104 mmol/L (ref 96–106)
Creatinine, Ser: 0.88 mg/dL (ref 0.76–1.27)
GFR calc Af Amer: 103 mL/min/{1.73_m2} (ref >59)
GFR calc non Af Amer: 90 mL/min/{1.73_m2} (ref >59)
Glucose: 100 mg/dL — ABNORMAL HIGH (ref 65–99)
Phosphorus: 3.2 mg/dL (ref 2.8–4.1)
Potassium: 4.2 mmol/L (ref 3.5–5.2)
Sodium: 142 mmol/L (ref 134–144)

## 2020-04-08 LAB — LIPID PANEL WITH LDL/HDL RATIO
Cholesterol, Total: 184 mg/dL (ref 100–199)
HDL: 46 mg/dL (ref >39)
LDL Chol Calc (NIH): 123 mg/dL — ABNORMAL HIGH (ref 0–99)
LDL/HDL Ratio: 2.7 ratio (ref 0.0–3.6)
Triglycerides: 79 mg/dL (ref 0–149)
VLDL Cholesterol Cal: 15 mg/dL (ref 5–40)

## 2020-04-08 LAB — HEPATIC FUNCTION PANEL
ALT: 10 IU/L (ref 0–44)
AST: 10 IU/L (ref 0–40)
Alkaline Phosphatase: 147 IU/L — ABNORMAL HIGH (ref 48–121)
Bilirubin Total: 0.2 mg/dL (ref 0.0–1.2)
Bilirubin, Direct: 0.08 mg/dL (ref 0.00–0.40)
Total Protein: 6.9 g/dL (ref 6.0–8.5)

## 2020-05-06 ENCOUNTER — Ambulatory Visit: Payer: Medicare PPO

## 2020-05-18 ENCOUNTER — Other Ambulatory Visit: Payer: Self-pay | Admitting: Family Medicine

## 2020-05-18 DIAGNOSIS — R601 Generalized edema: Secondary | ICD-10-CM

## 2020-05-18 DIAGNOSIS — I38 Endocarditis, valve unspecified: Secondary | ICD-10-CM

## 2020-05-18 DIAGNOSIS — I10 Essential (primary) hypertension: Secondary | ICD-10-CM

## 2020-05-18 NOTE — Telephone Encounter (Signed)
Requested Prescriptions  Pending Prescriptions Disp Refills  . hydrochlorothiazide (HYDRODIURIL) 12.5 MG tablet [Pharmacy Med Name: hydroCHLOROthiazide 12.5 MG Oral Tablet] 90 tablet 1    Sig: Take 1 tablet by mouth once daily     Cardiovascular: Diuretics - Thiazide Failed - 05/18/2020  8:34 AM      Failed - Last BP in normal range    BP Readings from Last 1 Encounters:  04/06/20 (!) 130/100         Passed - Ca in normal range and within 360 days    Calcium  Date Value Ref Range Status  04/06/2020 8.9 8.6 - 10.2 mg/dL Final         Passed - Cr in normal range and within 360 days    Creatinine  Date Value Ref Range Status  12/04/2013 1.31 (H) 0.60 - 1.30 mg/dL Final   Creatinine, Ser  Date Value Ref Range Status  04/06/2020 0.88 0.76 - 1.27 mg/dL Final         Passed - K in normal range and within 360 days    Potassium  Date Value Ref Range Status  04/06/2020 4.2 3.5 - 5.2 mmol/L Final         Passed - Na in normal range and within 360 days    Sodium  Date Value Ref Range Status  04/06/2020 142 134 - 144 mmol/L Final         Passed - Valid encounter within last 6 months    Recent Outpatient Visits          1 month ago Essential hypertension   [place], Deanna C, MD   7 months ago Essential hypertension   [place], Deanna C, MD   8 months ago Generalized edema   [place], Deanna C, MD   9 months ago Essential hypertension   [facility] Juline Patch, MD   1 year ago Viral syndrome   [facility] Juline Patch, MD      Future Appointments            In 3 months Juline Patch, MD [facility], [place]   In 4 months Diam[facility], [facility] Seta, [place]

## 2020-05-26 ENCOUNTER — Encounter: Payer: Self-pay | Admitting: Family Medicine

## 2020-05-26 ENCOUNTER — Other Ambulatory Visit: Payer: Self-pay

## 2020-05-26 ENCOUNTER — Ambulatory Visit: Payer: Medicare PPO | Admitting: Family Medicine

## 2020-05-26 VITALS — BP 148/82 | HR 62 | Temp 98.4°F | Ht 73.0 in | Wt 246.0 lb

## 2020-05-26 DIAGNOSIS — R059 Cough, unspecified: Secondary | ICD-10-CM

## 2020-05-26 DIAGNOSIS — R05 Cough: Secondary | ICD-10-CM

## 2020-05-26 DIAGNOSIS — N5239 Other post-surgical erectile dysfunction: Secondary | ICD-10-CM | POA: Diagnosis not present

## 2020-05-26 DIAGNOSIS — J0100 Acute maxillary sinusitis, unspecified: Secondary | ICD-10-CM

## 2020-05-26 MED ORDER — GUAIFENESIN-CODEINE 100-10 MG/5ML PO SYRP: 5.0000 mL | ORAL_SOLUTION | Freq: Four times a day (QID) | ORAL | 0 refills | Status: DC | PRN

## 2020-05-26 MED ORDER — AMOXICILLIN 500 MG PO CAPS
500.0000 mg | ORAL_CAPSULE | Freq: Three times a day (TID) | ORAL | 0 refills | Status: DC
Start: 2020-05-26 — End: 2020-09-14

## 2020-05-26 NOTE — Progress Notes (Signed)
Date:  05/26/2020   Name:  [name]   DOB:  [birth year]   MRN:  [id number]   Chief Complaint: Cough (Drainage in throat, cough- clear production. Lymph nodes in neck feels swollen. No fever. )  Cough This is a new problem. The current episode started in the past 7 days. The problem has been waxing and waning. The cough is non-productive. Pertinent negatives include no chest pain, chills, ear congestion, ear pain, fever, headaches, heartburn, hemoptysis, myalgias, nasal congestion, postnasal drip, rash, rhinorrhea, sore throat, shortness of breath, sweats, weight loss or wheezing. The treatment provided mild relief. There is no history of environmental allergies.  Sinusitis This is a new problem. The current episode started in the past 7 days. The problem has been waxing and waning since onset. There has been no fever. The pain is moderate. Associated symptoms include coughing. Pertinent negatives include no chills, congestion, diaphoresis, ear pain, headaches, hoarse voice, neck pain, shortness of breath, sinus pressure, sneezing, sore throat or swollen glands. Past treatments include nothing. The treatment provided moderate relief.  Erectile Dysfunction This is a new problem. The current episode started more than 1 month ago. The nature of his difficulty is achieving erection and maintaining erection. He reports no anxiety. Irritative symptoms do not include frequency, nocturia or urgency. Obstructive symptoms do not include dribbling, incomplete emptying, an intermittent stream, a slower stream, straining or a weak stream. Pertinent negatives include no chills, dysuria or hematuria. Past treatments include nothing.    Lab Results  Component Value Date   CREATININE 0.88 04/06/2020   BUN 13 04/06/2020   NA 142 04/06/2020   K 4.2 04/06/2020   CL 104 04/06/2020   CO2 22 04/06/2020   Lab Results  Component Value Date   CHOL 184 04/06/2020   HDL 46 04/06/2020   LDLCALC 123 (H)  04/06/2020   TRIG 79 04/06/2020   CHOLHDL 4.1 08/07/2019   No results found for: TSH No results found for: HGBA1C Lab Results  Component Value Date   WBC 5.9 03/13/2020   HGB 14.1 03/13/2020   HCT 44.3 03/13/2020   MCV 79.4 (L) 03/13/2020   PLT 169 03/13/2020   Lab Results  Component Value Date   ALT 10 04/06/2020   AST 10 04/06/2020   ALKPHOS 147 (H) 04/06/2020   BILITOT 0.2 04/06/2020     Review of Systems  Constitutional: Negative for chills, diaphoresis, fever and weight loss.  HENT: Negative for congestion, drooling, ear discharge, ear pain, hoarse voice, postnasal drip, rhinorrhea, sinus pressure, sneezing and sore throat.   Respiratory: Positive for cough. Negative for hemoptysis, shortness of breath, wheezing and stridor.   Cardiovascular: Negative for chest pain, palpitations and leg swelling.  Gastrointestinal: Negative for abdominal pain, blood in stool, constipation, diarrhea, heartburn and nausea.  Endocrine: Negative for polydipsia.  Genitourinary: Negative for dysuria, frequency, hematuria, incomplete emptying, nocturia and urgency.  Musculoskeletal: Negative for back pain, myalgias and neck pain.  Skin: Negative for rash.  Allergic/Immunologic: Negative for environmental allergies.  Neurological: Negative for dizziness and headaches.  Hematological: Does not bruise/bleed easily.  Psychiatric/Behavioral: Negative for suicidal ideas. The patient is not nervous/anxious.     Patient Active Problem List   Diagnosis Date Noted  . Herniated nucleus pulposus, lumbar 03/16/2020  . Spinal stenosis of lumbar region 03/16/2020  . HNP (herniated nucleus pulposus), lumbar 06/12/2019  . UTI due to Klebsiella species 05/08/2019  . Biceps tendonitis on right 07/27/2018  . Primary osteoarthritis of  right knee 10/12/2017  . Taking multiple medications for chronic disease 07/30/2015  . Bronchitis 07/01/2015  . Reactive airway disease 07/01/2015  . Allergic sinusitis  07/01/2015  . Essential hypertension 07/01/2015  . Valvular heart disease 07/01/2015  . Metatarsalgia of left foot 06/10/2015  . [place] arthritis 12/15/2013    Allergies  Allergen Reactions  . Mobic [Meloxicam] Palpitations    Past Surgical History:  Procedure Laterality Date  . BACK SURGERY  2018  . COLONOSCOPY    . HERNIA REPAIR    . JOINT REPLACEMENT Left     x2  . LITHOTRIPSY    . [place] SURGERY  47/14/2020  . Lumbar disectomy  01/18/2019  . TRANSFORAMINAL LUMBAR INTERBODY FUSION (TLIF) WITH PEDICLE SCREW FIXATION 1 LEVEL N/A 06/12/2019   Procedure: LUMBAR FOUR-FIVE TRANSFORAMINAL LUMBAR INTERBODY FUSION (TLIF) WITH PEDICLE SCREW FIXATION;  Surgeon: Kary Kos, MD;  Location: [place];  Service: Neurosurgery;  Laterality: N/A;    Social History   Tobacco Use  . Smoking status: Former Research scientist (life sciences)  . Smokeless tobacco: Former Systems developer    Types: Chew    Quit date: 12/2018  . Tobacco comment: Quit Fall 2020  Vaping Use  . Vaping Use: Never used  Substance Use Topics  . Alcohol use: Yes    Comment: 2 drinks a month  . Drug use: Never     Medication list has been reviewed and updated.  Current Meds  Medication Sig  . apixaban (ELIQUIS) 5 MG TABS tablet Take 5 mg by mouth 2 (two) times daily.  . celecoxib (CELEBREX) 200 MG capsule Take 1 capsule (200 mg total) by mouth daily.  . chlorproMAZINE (THORAZINE) 25 MG tablet Take 1 tablet (25 mg total) by mouth 4 (four) times daily as needed for hiccoughs.  . diltiazem (CARDIZEM CD) 120 MG 24 hr capsule Take 1 capsule (120 mg total) by mouth daily. (Patient taking differently: Take 120 mg by mouth in the morning and at bedtime. )  . fexofenadine (ALLEGRA) 180 MG tablet Take 1 tablet (180 mg total) by mouth daily.  . fluticasone (FLONASE) 50 MCG/ACT nasal spray Use 1 spray(s) in each nostril once daily  . hydrochlorothiazide (HYDRODIURIL) 12.5 MG tablet Take 1 tablet by mouth once daily  . lisinopril (ZESTRIL) 20 MG tablet Take 1  tablet by mouth in the evening  . metoprolol tartrate (LOPRESSOR) 50 MG tablet Take 25 mg by mouth 2 (two) times daily.   . montelukast (SINGULAIR) 10 MG tablet Take 1 tablet (10 mg total) by mouth at bedtime.  . Multiple Vitamins-Minerals (MULTIVITAMIN WITH MINERALS) tablet Take 1 tablet by mouth every evening.   . Olopatadine HCl (PATADAY) 0.2 % SOLN Place 1 drop into both eyes 2 (two) times daily as needed (allergies.).  Marland Kitchen oxyCODONE-acetaminophen (PERCOCET) 5-325 MG tablet Take 1 tablet by mouth every 4 (four) hours as needed for severe pain.  . pantoprazole (PROTONIX) 40 MG tablet Take 1 tablet (40 mg total) by mouth daily.  . potassium chloride SA (KLOR-CON) 20 MEQ tablet Take 3 tablets (60 mEq total) by mouth daily.  . tamsulosin (FLOMAX) 0.4 MG CAPS capsule TAKE ONE (1) CAPSULE EACH DAY. (Patient taking differently: Take 0.4 mg by mouth daily. )  . traMADol (ULTRAM) 50 MG tablet Take 50 mg by mouth 2 (two) times daily as needed for pain.    PHQ 2/9 Scores 05/26/2020 04/06/2020 10/02/2019 09/03/2019  PHQ - 2 Score 0 0 0 0  PHQ- 9 Score 0 0 0 0  GAD 7 : Generalized Anxiety Score 04/06/2020 09/03/2019  Nervous, Anxious, on Edge 0 0  Control/stop worrying 0 0  Worry too much - different things 0 0  Trouble relaxing 0 0  Restless 0 0  Easily annoyed or irritable 0 0  Afraid - awful might happen 0 0  Total GAD 7 Score 0 0    BP Readings from Last 3 Encounters:  05/26/20 (!) 148/82  04/06/20 (!) 130/100  03/18/20 (!) 143/96    Physical Exam Vitals and nursing note reviewed.  HENT:     Head: Normocephalic.     Right Ear: Tympanic membrane, ear canal and external ear normal.     Left Ear: Tympanic membrane, ear canal and external ear normal.     Nose: Nose normal. No congestion or rhinorrhea.     Mouth/Throat:     Mouth: Mucous membranes are moist.  Eyes:     General: No scleral icterus.       Right eye: No discharge.        Left eye: No discharge.     Conjunctiva/sclera:  Conjunctivae normal.     Pupils: Pupils are equal, round, and reactive to light.  Neck:     Thyroid: No thyromegaly.     Vascular: No JVD.     Trachea: No tracheal deviation.  Cardiovascular:     Rate and Rhythm: Normal rate and regular rhythm.     Heart sounds: Normal heart sounds. No murmur heard.  No friction rub. No gallop.   Pulmonary:     Effort: No respiratory distress.     Breath sounds: Normal breath sounds. No stridor. No wheezing, rhonchi or rales.  Chest:     Chest wall: No tenderness.  Abdominal:     General: Bowel sounds are normal.     Palpations: Abdomen is soft. There is no mass.     Tenderness: There is no abdominal tenderness. There is no guarding or rebound.  Musculoskeletal:        General: No tenderness. Normal range of motion.     Cervical back: Normal range of motion and neck supple.  Lymphadenopathy:     Cervical: No cervical adenopathy.  Skin:    General: Skin is warm.     Capillary Refill: Capillary refill takes less than 2 seconds.     Findings: No rash.  Neurological:     Mental Status: He is alert and oriented to person, place, and time.     Cranial Nerves: No cranial nerve deficit.     Deep Tendon Reflexes: Reflexes are normal and symmetric.     Wt Readings from Last 3 Encounters:  05/26/20 246 lb (111.6 kg)  04/06/20 238 lb (108 kg)  03/16/20 240 lb (108.9 kg)    BP (!) 148/82   Pulse 62   Temp 98.4 F (36.9 C) (Oral)   Ht 6\' 1"  (1.854 m)   Wt 246 lb (111.6 kg)   SpO2 97%   BMI 32.46 kg/m   Assessment and Plan: 1. Acute maxillary sinusitis, recurrence not specified Acute.  Persistent.  Relatively stable.  Will treat with amoxicillin 500 mg 3 times a day.  For 10 days.  2. Cough Subsequent cough with the postnasal drainage from above.  We will provide Robitussin-AC on a as needed basis the patient will not have cough during periods in which he needs to be in person. - guaiFENesin-codeine (ROBITUSSIN AC) 100-10 MG/5ML syrup; Take  5 mLs by mouth 4 (four) times daily  as needed for cough.  Dispense: 118 mL; Refill: 0  3. Other post-procedural erectile dysfunction New onset.  Persistent.  Patient has had increasing ED since back surgery.  We will 1st check a testosterone free and total and if unremarkable will consider a trial of sildenafil. - Testosterone,Free and Total

## 2020-05-30 LAB — TESTOSTERONE,FREE AND TOTAL
Testosterone, Free: 6.3 pg/mL — ABNORMAL LOW (ref 6.6–18.1)
Testosterone: 453 ng/dL (ref 264–916)

## 2020-06-15 ENCOUNTER — Telehealth: Payer: Self-pay | Admitting: Family Medicine

## 2020-06-15 DIAGNOSIS — C44622 Squamous cell carcinoma of skin of right upper limb, including shoulder: Secondary | ICD-10-CM | POA: Diagnosis not present

## 2020-06-15 NOTE — Telephone Encounter (Signed)
I faxed labs to [facility] and Dr Diamantina Providence- pt will follow up with appt to discuss with them, what is the next best thing to do for his concerns

## 2020-06-15 NOTE — Telephone Encounter (Signed)
Copied from [place] [phone]. Topic: General - Inquiry >> Jun 15, 2020  2:20 PM Percell Belt A wrote: Reason for CRM: pt wife called and stated that they would like to know what the next step is regarding the Testerone results?  She would like a nurse to call her  Best number is [phone]

## 2020-06-16 ENCOUNTER — Telehealth: Payer: Self-pay | Admitting: Urology

## 2020-06-16 NOTE — Telephone Encounter (Signed)
Patient had his testosterone checked at his PCP and he wants you to review them and decide if he needs to be seen here or not. Please advise

## 2020-06-16 NOTE — Telephone Encounter (Signed)
Called pt, no answer. Unable to leave message as no DPR is on file. Mychart message sent.

## 2020-06-16 NOTE — Telephone Encounter (Signed)
His testosterone with PCP was normal, does not need follow up with me PH:[id number]  Nickolas Madrid, MD 06/16/2020

## 2020-06-30 DIAGNOSIS — C4441 Basal cell carcinoma of skin of scalp and neck: Secondary | ICD-10-CM | POA: Diagnosis not present

## 2020-06-30 DIAGNOSIS — C4491 Basal cell carcinoma of skin, unspecified: Secondary | ICD-10-CM | POA: Diagnosis not present

## 2020-07-02 DIAGNOSIS — M5440 Lumbago with sciatica, unspecified side: Secondary | ICD-10-CM | POA: Diagnosis not present

## 2020-07-06 ENCOUNTER — Other Ambulatory Visit: Payer: Self-pay | Admitting: Neurosurgery

## 2020-07-06 DIAGNOSIS — M5440 Lumbago with sciatica, unspecified side: Secondary | ICD-10-CM

## 2020-07-07 ENCOUNTER — Ambulatory Visit
Admission: RE | Admit: 2020-07-07 | Discharge: 2020-07-07 | Disposition: A | Discharge status: Home / Self Care | Payer: Medicare PPO | Source: Ambulatory Visit | Attending: Neurosurgery | Admitting: Neurosurgery

## 2020-07-07 DIAGNOSIS — M5440 Lumbago with sciatica, unspecified side: Secondary | ICD-10-CM

## 2020-07-07 DIAGNOSIS — M545 Low back pain: Secondary | ICD-10-CM | POA: Diagnosis not present

## 2020-07-16 DIAGNOSIS — M5440 Lumbago with sciatica, unspecified side: Secondary | ICD-10-CM | POA: Diagnosis not present

## 2020-07-28 IMAGING — RF DG C-ARM 61-120 MIN
1 series · 3 of 3 positions shown · non-contrast
Comparison: 06/05/2019 lumbar spine CT.

CLINICAL DATA: Elective lumbar spine surgery

EXAM:
DG C-ARM 61-120 MIN; LUMBAR SPINE - 2-3 VIEW

[Series 1: run · 3 of 3 slices shown]
[im 1/3]
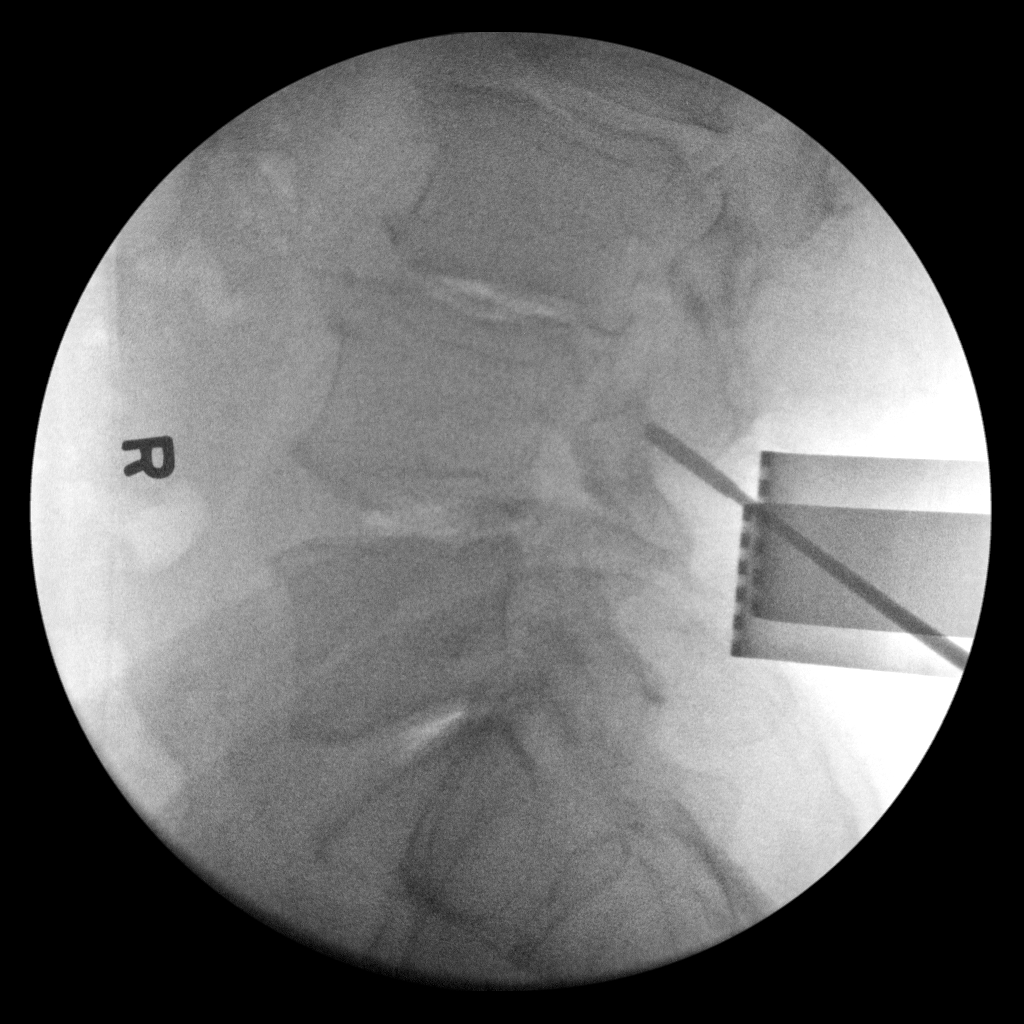
[im 2/3]
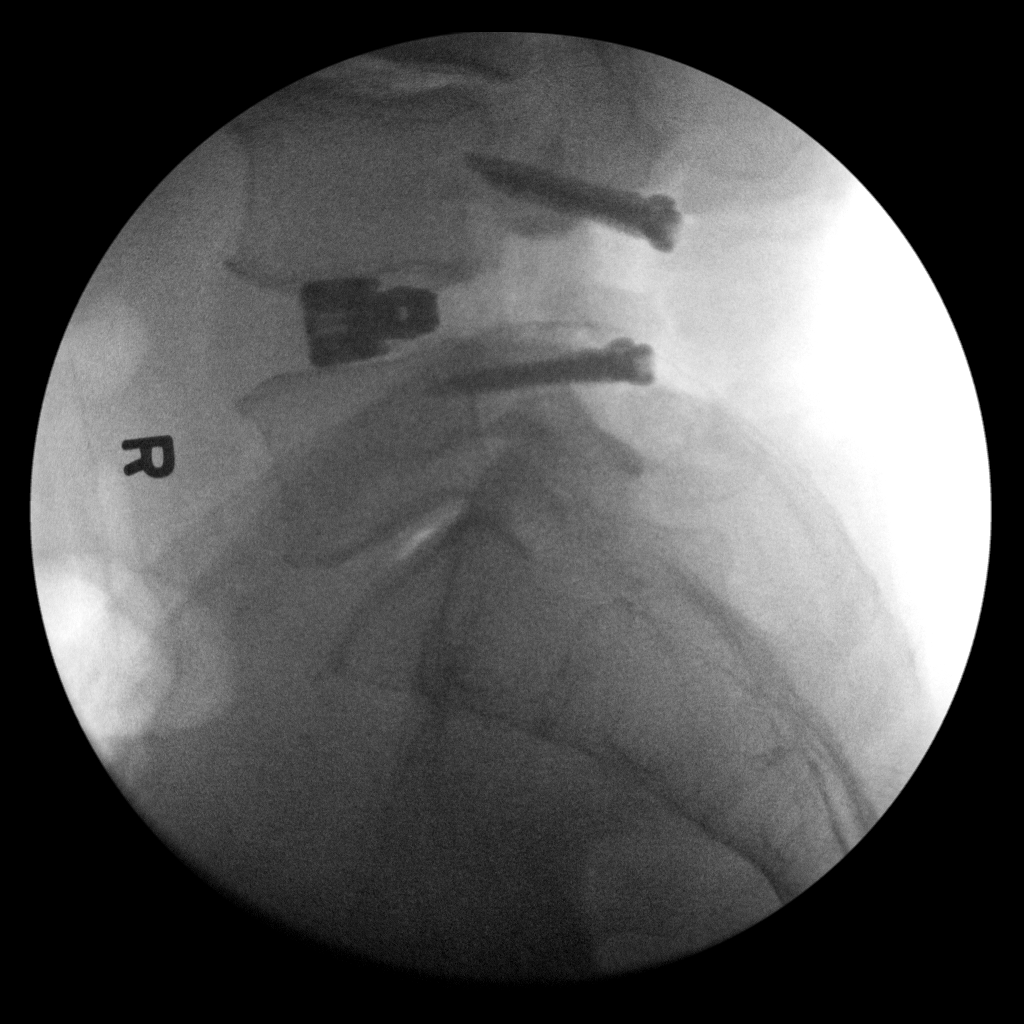
[im 3/3]
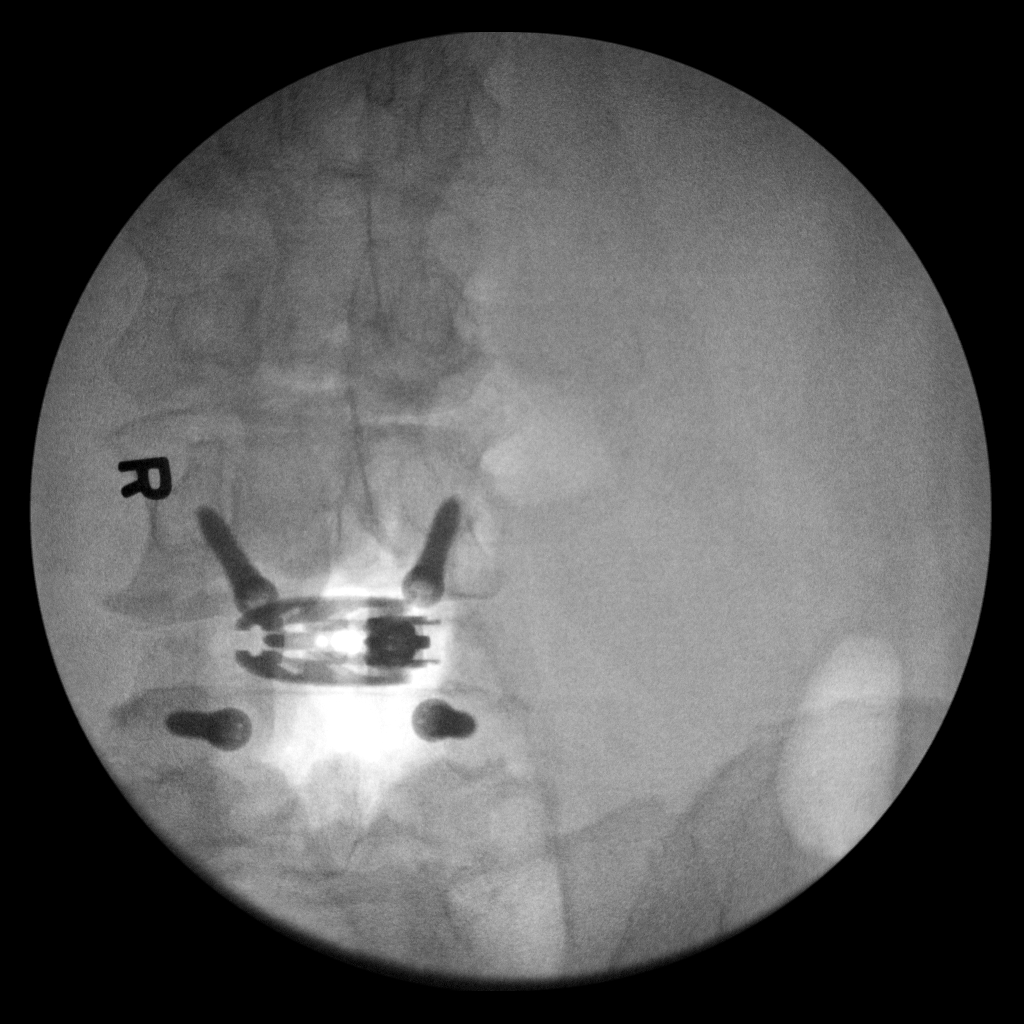

[3 of 3 positions shown; findings below may reference images not displayed]

FINDINGS: Fluoroscopy time 0 minutes 37 seconds. Spot fluoroscopic
nondiagnostic intraoperative radiographs of the lower lumbar spine
demonstrate postsurgical changes from bilateral posterior spinal
fusion at L4-5 with bilateral L4 and L5 pedicle screws and bone cage
in the L4-5 disc space.
IMPRESSION: Intraoperative fluoroscopic guidance for elective lumbar spine
surgery.

## 2020-07-30 DIAGNOSIS — M5440 Lumbago with sciatica, unspecified side: Secondary | ICD-10-CM | POA: Diagnosis not present

## 2020-09-02 DIAGNOSIS — I4891 Unspecified atrial fibrillation: Secondary | ICD-10-CM | POA: Diagnosis not present

## 2020-09-02 DIAGNOSIS — Z79891 Long term (current) use of opiate analgesic: Secondary | ICD-10-CM | POA: Diagnosis not present

## 2020-09-02 DIAGNOSIS — Z7901 Long term (current) use of anticoagulants: Secondary | ICD-10-CM | POA: Diagnosis not present

## 2020-09-02 DIAGNOSIS — I10 Essential (primary) hypertension: Secondary | ICD-10-CM | POA: Diagnosis not present

## 2020-09-02 DIAGNOSIS — M5116 Intervertebral disc disorders with radiculopathy, lumbar region: Secondary | ICD-10-CM | POA: Diagnosis not present

## 2020-09-02 DIAGNOSIS — M4726 Other spondylosis with radiculopathy, lumbar region: Secondary | ICD-10-CM | POA: Diagnosis not present

## 2020-09-02 DIAGNOSIS — Z981 Arthrodesis status: Secondary | ICD-10-CM | POA: Diagnosis not present

## 2020-09-02 DIAGNOSIS — M48062 Spinal stenosis, lumbar region with neurogenic claudication: Secondary | ICD-10-CM | POA: Diagnosis not present

## 2020-09-14 ENCOUNTER — Ambulatory Visit: Payer: Medicare PPO | Admitting: Family Medicine

## 2020-09-14 ENCOUNTER — Other Ambulatory Visit: Payer: Self-pay

## 2020-09-14 ENCOUNTER — Encounter: Payer: Self-pay | Admitting: Family Medicine

## 2020-09-14 ENCOUNTER — Ambulatory Visit (INDEPENDENT_AMBULATORY_CARE_PROVIDER_SITE_OTHER): Payer: Medicare PPO

## 2020-09-14 VITALS — BP 138/80 | HR 80 | Resp 16 | Ht 73.0 in | Wt 249.0 lb

## 2020-09-14 DIAGNOSIS — K219 Gastro-esophageal reflux disease without esophagitis: Secondary | ICD-10-CM | POA: Diagnosis not present

## 2020-09-14 DIAGNOSIS — M5136 Other intervertebral disc degeneration, lumbar region: Secondary | ICD-10-CM

## 2020-09-14 DIAGNOSIS — Z0000 Encounter for general adult medical examination without abnormal findings: Secondary | ICD-10-CM

## 2020-09-14 DIAGNOSIS — E876 Hypokalemia: Secondary | ICD-10-CM

## 2020-09-14 DIAGNOSIS — J302 Other seasonal allergic rhinitis: Secondary | ICD-10-CM

## 2020-09-14 DIAGNOSIS — I38 Endocarditis, valve unspecified: Secondary | ICD-10-CM | POA: Diagnosis not present

## 2020-09-14 DIAGNOSIS — M1712 Unilateral primary osteoarthritis, left knee: Secondary | ICD-10-CM | POA: Diagnosis not present

## 2020-09-14 DIAGNOSIS — R601 Generalized edema: Secondary | ICD-10-CM

## 2020-09-14 DIAGNOSIS — I10 Essential (primary) hypertension: Secondary | ICD-10-CM

## 2020-09-14 MED ORDER — POTASSIUM CHLORIDE CRYS ER 20 MEQ PO TBCR: 60.0000 meq | EXTENDED_RELEASE_TABLET | Freq: Every day | ORAL | 1 refills | Status: DC

## 2020-09-14 MED ORDER — METOPROLOL TARTRATE 50 MG PO TABS: 25.0000 mg | ORAL_TABLET | Freq: Two times a day (BID) | ORAL | 1 refills | Status: DC

## 2020-09-14 MED ORDER — HYDROCHLOROTHIAZIDE 12.5 MG PO TABS: 12.5000 mg | ORAL_TABLET | Freq: Every day | ORAL | 1 refills | Status: DC

## 2020-09-14 MED ORDER — MONTELUKAST SODIUM 10 MG PO TABS: 10.0000 mg | ORAL_TABLET | Freq: Every day | ORAL | 1 refills | Status: DC

## 2020-09-14 MED ORDER — FEXOFENADINE HCL 180 MG PO TABS: 180.0000 mg | ORAL_TABLET | Freq: Every day | ORAL | 1 refills | Status: DC

## 2020-09-14 MED ORDER — DILTIAZEM HCL ER COATED BEADS 120 MG PO CP24: 120.0000 mg | ORAL_CAPSULE | Freq: Two times a day (BID) | ORAL | 1 refills | Status: DC

## 2020-09-14 MED ORDER — CELECOXIB 200 MG PO CAPS: 200.0000 mg | ORAL_CAPSULE | Freq: Every day | ORAL | 5 refills | Status: DC

## 2020-09-14 MED ORDER — LISINOPRIL 20 MG PO TABS: 20.0000 mg | ORAL_TABLET | Freq: Every evening | ORAL | 1 refills | Status: DC

## 2020-09-14 MED ORDER — FLUTICASONE PROPIONATE 50 MCG/ACT NA SUSP: NASAL | 1 refills | Status: DC

## 2020-09-14 MED ORDER — PANTOPRAZOLE SODIUM 40 MG PO TBEC: 40.0000 mg | DELAYED_RELEASE_TABLET | Freq: Every day | ORAL | 1 refills | Status: DC

## 2020-09-14 NOTE — Patient Instructions (Signed)
[name] , Thank you for taking time to come for your Medicare Wellness Visit. I appreciate your ongoing commitment to your health goals. Please review the following plan we discussed and let me know if I can assist you in the future.   Screening recommendations/referrals: Colonoscopy: done 11/08/19. Repeat in 2024 Recommended yearly ophthalmology/optometry visit for glaucoma screening and checkup Recommended yearly dental visit for hygiene and checkup  Vaccinations: Influenza vaccine: done 07/24/20 Pneumococcal vaccine: done 08/07/19 Tdap vaccine: done 06/21/12 Shingles vaccine: Shingrix discussed. Please contact your pharmacy for coverage information.  Covid-19: done 10/22/19, 11/22/19 & 09/09/20  Advanced directives: Advance directive discussed with you today. I have provided a copy for you to complete at home and have notarized. Once this is complete please bring a copy in to our office so we can scan it into your chart.  Conditions/risks identified: Recommend increasing physical activity as tolerated  Next appointment: Follow up in one year for your annual wellness visit.   Preventive Care 66 Years and Older, Male Preventive care refers to lifestyle choices and visits with your health care provider that can promote health and wellness. What does preventive care include?  A yearly physical exam. This is also called an annual well check.  Dental exams once or twice a year.  Routine eye exams. Ask your health care provider how often you should have your eyes checked.  Personal lifestyle choices, including:  Daily care of your teeth and gums.  Regular physical activity.  Eating a healthy diet.  Avoiding tobacco and drug use.  Limiting alcohol use.  Practicing safe sex.  Taking low doses of aspirin every day.  Taking vitamin and mineral supplements as recommended by your health care provider. What happens during an annual well check? The services and screenings done by  your health care provider during your annual well check will depend on your age, overall health, lifestyle risk factors, and family history of disease. Counseling  Your health care provider may ask you questions about your:  Alcohol use.  Tobacco use.  Drug use.  Emotional well-being.  Home and relationship well-being.  Sexual activity.  Eating habits.  History of falls.  Memory and ability to understand (cognition).  Work and work Statistician. Screening  You may have the following tests or measurements:  Height, weight, and BMI.  Blood pressure.  Lipid and cholesterol levels. These may be checked every 5 years, or more frequently if you are over 5 years old.  Skin check.  Lung cancer screening. You may have this screening every year starting at age 66 if you have a 30-pack-year history of smoking and currently smoke or have quit within the past 15 years.  Fecal occult blood test (FOBT) of the stool. You may have this test every year starting at age 666.  Flexible sigmoidoscopy or colonoscopy. You may have a sigmoidoscopy every 5 years or a colonoscopy every 10 years starting at age 666.  Prostate cancer screening. Recommendations will vary depending on your family history and other risks.  Hepatitis C blood test.  Hepatitis B blood test.  Sexually transmitted disease (STD) testing.  Diabetes screening. This is done by checking your blood sugar (glucose) after you have not eaten for a while (fasting). You may have this done every 1-3 years.  Abdominal aortic aneurysm (AAA) screening. You may need this if you are a current or former smoker.  Osteoporosis. You may be screened starting at age 666 if you are at high risk. Talk with  your health care provider about your test results, treatment options, and if necessary, the need for more tests. Vaccines  Your health care provider may recommend certain vaccines, such as:  Influenza vaccine. This is recommended every  year.  Tetanus, diphtheria, and acellular pertussis (Tdap, Td) vaccine. You may need a Td booster every 10 years.  Zoster vaccine. You may need this after age 66.  Pneumococcal 13-valent conjugate (PCV13) vaccine. One dose is recommended after age 66.  Pneumococcal polysaccharide (PPSV23) vaccine. One dose is recommended after age 66. Talk to your health care provider about which screenings and vaccines you need and how often you need them. This information is not intended to replace advice given to you by your health care provider. Make sure you discuss any questions you have with your health care provider. Document Released: 11/06/2015 Document Revised: 06/29/2016 Document Reviewed: 08/11/2015 Elsevier Interactive Patient Education  2017 [employer] Prevention in the Home Falls can cause injuries. They can happen to people of all ages. There are many things you can do to make your home safe and to help prevent falls. What can I do on the outside of my home?  Regularly fix the edges of walkways and driveways and fix any cracks.  Remove anything that might make you trip as you walk through a door, such as a raised step or threshold.  Trim any bushes or trees on the path to your home.  Use bright outdoor lighting.  Clear any walking paths of anything that might make someone trip, such as rocks or tools.  Regularly check to see if handrails are loose or broken. Make sure that both sides of any steps have handrails.  Any raised decks and porches should have guardrails on the edges.  Have any leaves, snow, or ice cleared regularly.  Use sand or salt on walking paths during winter.  Clean up any spills in your garage right away. This includes oil or grease spills. What can I do in the bathroom?  Use night lights.  Install grab bars by the toilet and in the tub and shower. Do not use towel bars as grab bars.  Use non-skid mats or decals in the tub or shower.  If you  need to sit down in the shower, use a plastic, non-slip stool.  Keep the floor dry. Clean up any water that spills on the floor as soon as it happens.  Remove soap buildup in the tub or shower regularly.  Attach bath mats securely with double-sided non-slip rug tape.  Do not have throw rugs and other things on the floor that can make you trip. What can I do in the bedroom?  Use night lights.  Make sure that you have a light by your bed that is easy to reach.  Do not use any sheets or blankets that are too big for your bed. They should not hang down onto the floor.  Have a firm chair that has side arms. You can use this for support while you get dressed.  Do not have throw rugs and other things on the floor that can make you trip. What can I do in the kitchen?  Clean up any spills right away.  Avoid walking on wet floors.  Keep items that you use a lot in easy-to-reach places.  If you need to reach something above you, use a strong step stool that has a grab bar.  Keep electrical cords out of the way.  Do not  use floor polish or wax that makes floors slippery. If you must use wax, use non-skid floor wax.  Do not have throw rugs and other things on the floor that can make you trip. What can I do with my stairs?  Do not leave any items on the stairs.  Make sure that there are handrails on both sides of the stairs and use them. Fix handrails that are broken or loose. Make sure that handrails are as long as the stairways.  Check any carpeting to make sure that it is firmly attached to the stairs. Fix any carpet that is loose or worn.  Avoid having throw rugs at the top or bottom of the stairs. If you do have throw rugs, attach them to the floor with carpet tape.  Make sure that you have a light switch at the top of the stairs and the bottom of the stairs. If you do not have them, ask someone to add them for you. What else can I do to help prevent falls?  Wear shoes  that:  Do not have high heels.  Have rubber bottoms.  Are comfortable and fit you well.  Are closed at the toe. Do not wear sandals.  If you use a stepladder:  Make sure that it is fully opened. Do not climb a closed stepladder.  Make sure that both sides of the stepladder are locked into place.  Ask someone to hold it for you, if possible.  Clearly mark and make sure that you can see:  Any grab bars or handrails.  First and last steps.  Where the edge of each step is.  Use tools that help you move around (mobility aids) if they are needed. These include:  Canes.  Walkers.  Scooters.  Crutches.  Turn on the lights when you go into a dark area. Replace any light bulbs as soon as they burn out.  Set up your furniture so you have a clear path. Avoid moving your furniture around.  If any of your floors are uneven, fix them.  If there are any pets around you, be aware of where they are.  Review your medicines with your doctor. Some medicines can make you feel dizzy. This can increase your chance of falling. Ask your doctor what other things that you can do to help prevent falls. This information is not intended to replace advice given to you by your health care provider. Make sure you discuss any questions you have with your health care provider. Document Released: 08/06/2009 Document Revised: 03/17/2016 Document Reviewed: 11/14/2014 Elsevier Interactive Patient Education  2017 [employer].

## 2020-09-14 NOTE — Progress Notes (Signed)
Subjective:   [name] is a 64 y.o. male who presents for an Initial Medicare Annual Wellness Visit.  Review of Systems     Cardiac Risk Factors include: advanced age (>33men, >50 women);male gender;hypertension;obesity (BMI >30kg/m2)     Objective:    Today's Vitals   09/14/20 0854  BP: 138/80  Pulse: 80  Resp: 16  Weight: 249 lb (112.9 kg)  Height: 6\' 1"  (1.854 m)  PainSc: 5    Body mass index is 32.85 kg/m.  Advanced Directives 09/14/2020 03/13/2020 06/13/2019 06/10/2019 07/28/2018 01/28/2017 07/30/2015  Does Patient Have a Medical Advance Directive? No No No No No No No  Does patient want to make changes to medical advance directive? Yes (MAU/Ambulatory/Procedural Areas - Information given) - - - - - -  Would patient like information on creating a medical advance directive? - No - Patient declined No - Patient declined No - Patient declined No - Patient declined No - Patient declined No - patient declined information    Current Medications (verified) Outpatient Encounter Medications as of 09/14/2020  Medication Sig  . apixaban (ELIQUIS) 5 MG TABS tablet Take 5 mg by mouth 2 (two) times daily.  . celecoxib (CELEBREX) 200 MG capsule Take 1 capsule (200 mg total) by mouth daily.  . chlorproMAZINE (THORAZINE) 25 MG tablet Take 1 tablet (25 mg total) by mouth 4 (four) times daily as needed for hiccoughs.  . diltiazem (CARDIZEM CD) 120 MG 24 hr capsule Take 1 capsule (120 mg total) by mouth in the morning and at bedtime.  . fexofenadine (ALLEGRA) 180 MG tablet Take 1 tablet (180 mg total) by mouth daily.  . fluticasone (FLONASE) 50 MCG/ACT nasal spray Use 1 spray(s) in each nostril once daily  . hydrochlorothiazide (HYDRODIURIL) 12.5 MG tablet Take 1 tablet (12.5 mg total) by mouth daily.  Marland Kitchen lisinopril (ZESTRIL) 20 MG tablet Take 1 tablet (20 mg total) by mouth every evening.  . metoprolol tartrate (LOPRESSOR) 50 MG tablet Take 0.5 tablets (25 mg total) by mouth 2 (two)  times daily.  . montelukast (SINGULAIR) 10 MG tablet Take 1 tablet (10 mg total) by mouth at bedtime.  . Multiple Vitamins-Minerals (MULTIVITAMIN WITH MINERALS) tablet Take 1 tablet by mouth every evening.   . Olopatadine HCl (PATADAY) 0.2 % SOLN Place 1 drop into both eyes 2 (two) times daily as needed (allergies.).  Marland Kitchen pantoprazole (PROTONIX) 40 MG tablet Take 1 tablet (40 mg total) by mouth daily.  . potassium chloride SA (KLOR-CON) 20 MEQ tablet Take 3 tablets (60 mEq total) by mouth daily.  . tamsulosin (FLOMAX) 0.4 MG CAPS capsule TAKE ONE (1) CAPSULE EACH DAY. (Patient taking differently: Take 0.4 mg by mouth daily. )  . traMADol (ULTRAM) 50 MG tablet Take 50 mg by mouth 2 (two) times daily as needed for pain.    No facility-administered encounter medications on file as of 09/14/2020.    Allergies (verified) Mobic [meloxicam]   History: Past Medical History:  Diagnosis Date  . Allergy   . Arthritis   . Cancer ([place])    skin  . Complication of anesthesia    "Woke up with knee surgery"  . Dysrhythmia    Hx of Afib  . Heart murmur   . History of kidney stones    passed 1  . HOH (hard of hearing)    right ear  . Hyperlipidemia   . Hypertension   . Hypokalemia   . Joint pain    Past Surgical  History:  Procedure Laterality Date  . BACK SURGERY  2018  . COLONOSCOPY    . HERNIA REPAIR    . JOINT REPLACEMENT Left     x2  . LITHOTRIPSY    . [place] SURGERY  47/14/2020  . Lumbar disectomy  01/18/2019  . TRANSFORAMINAL LUMBAR INTERBODY FUSION (TLIF) WITH PEDICLE SCREW FIXATION 1 LEVEL N/A 06/12/2019   Procedure: LUMBAR FOUR-FIVE TRANSFORAMINAL LUMBAR INTERBODY FUSION (TLIF) WITH PEDICLE SCREW FIXATION;  Surgeon: Kary Kos, MD;  Location: [facility];  Service: Neurosurgery;  Laterality: N/A;   Family History  Problem Relation Age of Onset  . Cancer Mother   . Cancer Father   . Cancer Brother   . Hyperlipidemia Brother    Social History   Socioeconomic History  .  Marital status: Married    Spouse name: Not on file  . Number of children: Not on file  . Years of education: Not on file  . Highest education level: Not on file  Occupational History  . Not on file  Tobacco Use  . Smoking status: Former Research scientist (life sciences)  . Smokeless tobacco: Former Systems developer    Types: Chew    Quit date: 12/2018  . Tobacco comment: Quit Fall 2020  Vaping Use  . Vaping Use: Never used  Substance and Sexual Activity  . Alcohol use: Yes    Comment: 2 drinks a month  . Drug use: Never  . Sexual activity: Yes  Other Topics Concern  . Not on file  Social History Narrative  . Not on file   Social Determinants of Health   Financial Resource Strain: Low Risk   . Difficulty of Paying Living Expenses: Not hard at all  Food Insecurity: No Food Insecurity  . Worried About Charity fundraiser in the Last Year: Never true  . Ran Out of Food in the Last Year: Never true  Transportation Needs: No Transportation Needs  . Lack of Transportation (Medical): No  . Lack of Transportation (Non-Medical): No  Physical Activity: Inactive  . Days of Exercise per Week: 0 days  . Minutes of Exercise per Session: 0 min  Stress: No Stress Concern Present  . Feeling of Stress : Not at all  Social Connections: Socially Integrated  . Frequency of Communication with Friends and Family: More than three times a week  . Frequency of Social Gatherings with Friends and Family: More than three times a week  . Attends Religious Services: More than 4 times per year  . Active Member of Clubs or Organizations: Yes  . Attends Archivist Meetings: More than 4 times per year  . Marital Status: Married    Tobacco Counseling Counseling given: Not Answered Comment: Quit Fall 2020   Clinical Intake:  Pre-visit preparation completed: Yes  Pain : 0-10 Pain Score: 5  Pain Type: Chronic pain Pain Location: Back Pain Orientation: Lower, Right Pain Descriptors / Indicators: Aching, Sore Pain Onset:  More than a month ago Pain Frequency: Constant     BMI - recorded: 32.85 Nutritional Status: BMI > 30  Obese Nutritional Risks: None Diabetes: No  How often do you need to have someone help you when you read instructions, pamphlets, or other written materials from your doctor or pharmacy?: 1 - Never    Interpreter Needed?: No  Information entered by :: Clemetine Marker LPN   Activities of Daily Living In your present state of health, do you have any difficulty performing the following activities: 09/14/2020 03/13/2020  Hearing? Darreld Mclean  Y  Comment deaf in right ear -  Vision? N N  Difficulty concentrating or making decisions? N N  Walking or climbing stairs? N N  Dressing or bathing? N N  Doing errands, shopping? N N  Preparing Food and eating ? N -  Using the Toilet? N -  In the past six months, have you accidently leaked urine? N -  Do you have problems with loss of bowel control? N -  Managing your Medications? N -  Managing your Finances? N -  Housekeeping or managing your Housekeeping? N -  Some recent data might be hidden    Patient Care Team: Juline Patch, MD as PCP - General (Family Medicine)  Indicate any recent Medical Services you may have received from other than [facility] providers in the past year (date may be approximate).     Assessment:   This is a routine wellness examination for [place].  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 Mallie Mussel  Dietary issues and exercise activities discussed: Current Exercise Habits: The patient does not participate in regular exercise at present, Exercise limited by: orthopedic condition(s)  Goals    . Increase physical activity     Recommend increasing physical activity as  tolerated      Depression Screen PHQ 2/9 Scores 09/14/2020 09/14/2020 05/26/2020 04/06/2020 10/02/2019 09/03/2019 08/07/2019  PHQ - 2 Score 0 0 0 0 0 0 0  PHQ- 9 Score - 0 0 0 0 0 0    Fall Risk Fall Risk  09/14/2020 04/06/2020 12/14/2017 04/08/2016 02/11/2016  Falls in the past year? 0 0 No No No  Number falls in past yr: 0 - - - -  Injury with Fall? 0 - - - -  Risk for fall due to : No Fall Risks - - - -  Follow up Falls prevention discussed Falls evaluation completed - - -    Any stairs in or around the home? Yes  If so, are there any without handrails? No  Home free of loose throw rugs in walkways, pet beds, electrical cords, etc? Yes  Adequate lighting in your home to reduce risk of falls? Yes   ASSISTIVE DEVICES UTILIZED TO PREVENT FALLS:  Life alert? No  Use of a cane, walker or w/c? No  Grab bars in the bathroom? Yes  Shower chair or bench in shower? Yes  Elevated toilet seat or a handicapped toilet? No   TIMED UP AND GO:  Was the test performed? Yes .  Length of time to ambulate 10 feet: 4 sec.   Gait steady and fast without use of assistive device  Cognitive Function: Normal cognitive status assessed by direct observation by this Nurse Health Advisor.          Immunizations Immunization History  Administered Date(s) Administered  . Influenza, High Dose Seasonal PF 07/23/2019  . Influenza,inj,Quad PF,6+ Mos 07/30/2015, 07/26/2016, 09/18/2018  . Influenza-Unspecified 07/24/2020  . Moderna SARS-COVID-2 Vaccination 10/22/2019, 11/22/2019, 09/09/2020  . Pneumococcal Conjugate-13 08/07/2019  . Pneumococcal Polysaccharide-23 07/30/2015  . Tdap 06/21/2012    TDAP status: Up to date   Flu Vaccine status: Up to date   Pneumococcal vaccine status:  Up to date   Covid-19 vaccine status: Completed vaccines  Qualifies for Shingles Vaccine? Yes   Zostavax completed No   Shingrix Completed?: No.    Education has been provided regarding the importance of this  vaccine. Patient has been advised to call insurance company to determine out of pocket expense if they have not yet received this vaccine. Advised may also receive vaccine at local pharmacy or Health Dept. Verbalized acceptance and understanding.  Screening Tests Health Maintenance  Topic Date Due  . PNA vac Low Risk Adult (2 of 2 - PPSV23) 09/24/2020 (Originally 08/06/2020)  . TETANUS/TDAP  06/21/2022  . COLONOSCOPY  11/07/2022  . INFLUENZA VACCINE  Completed  . COVID-19 Vaccine  Completed  . Hepatitis C Screening  Completed    Health Maintenance  There are no preventive care reminders to display for this patient.  Colorectal cancer screening: Completed 11/08/19. Repeat every 3 years  Lung Cancer Screening: (Low Dose CT Chest recommended if Age 34-80 years, 30 pack-year currently smoking OR have quit w/in 15years.) does not qualify.   Additional Screening:  Hepatitis C Screening: does qualify; Completed 12/14/16  Vision Screening: Recommended annual ophthalmology exams for early detection of glaucoma and other disorders of the eye. Is the patient up to date with their annual eye exam?  Yes  Who is the provider or what is the name of the office in which the patient attends annual eye exams? Dr. Mallie Mussel  Dental Screening: Recommended annual dental exams for proper oral hygiene  Community Resource Referral / Chronic Care Management: CRR required this visit?  No   CCM required this visit?  No      Plan:     I have personally reviewed and noted the following in the patient's chart:   . Medical and social history . Use of alcohol, tobacco or illicit drugs  . Current medications and supplements . Functional ability and status . Nutritional status . Physical activity . Advanced directives . List of other physicians . Hospitalizations, surgeries, and ER visits in previous 12 months . Vitals . Screenings to include cognitive, depression, and falls . Referrals and  appointments  In addition, I have reviewed and discussed with patient certain preventive protocols, quality metrics, and best practice recommendations. A written personalized care plan for preventive services as well as general preventive health recommendations were provided to patient.     Clemetine Marker, LPN   [id number]   Nurse Notes: none

## 2020-09-14 NOTE — Progress Notes (Signed)
Date:  09/14/2020   Name:  [name]   DOB:  [birth year]   MRN:  [id number]   Chief Complaint: Gastroesophageal Reflux, Allergic Rhinitis , Hypertension, and hypokalemia  Gastroesophageal Reflux He reports no abdominal pain, no belching, no chest pain, no choking, no coughing, no dysphagia, no early satiety, no globus sensation, no heartburn, no hoarse voice, no nausea, no sore throat, no stridor, no tooth decay, no water brash or no wheezing. This is a chronic problem. The current episode started more than 1 year ago. The problem occurs constantly. The problem has been unchanged. The symptoms are aggravated by certain foods. Pertinent negatives include no anemia, fatigue, melena, muscle weakness, orthopnea or weight loss. There are no known risk factors. He has tried a PPI for the symptoms. The treatment provided moderate relief.  Hypertension This is a chronic problem. The current episode started more than 1 year ago. The problem has been gradually improving since onset. The problem is controlled. Pertinent negatives include no anxiety, blurred vision, chest pain, headaches, malaise/fatigue, neck pain, orthopnea, palpitations, peripheral edema, PND, shortness of breath or sweats. There are no associated agents to hypertension. There are no known risk factors for coronary artery disease. Past treatments include ACE inhibitors, beta blockers, calcium channel blockers and diuretics. The current treatment provides moderate improvement. There are no compliance problems.  There is no history of angina, kidney disease, CAD/MI, CVA, heart failure, left ventricular hypertrophy, PVD or retinopathy. There is no history of chronic renal disease, a hypertension causing med or renovascular disease.  Back Pain This is a chronic problem. The current episode started more than 1 year ago. The problem occurs constantly. The problem has been gradually worsening since onset. The pain is present in the lumbar  spine. The pain is moderate. Associated symptoms include leg pain. Pertinent negatives include no abdominal pain, bladder incontinence, bowel incontinence, chest pain, dysuria, fever, headaches, numbness, paresis, paresthesias, pelvic pain, perianal numbness, tingling, weakness or weight loss.    Lab Results  Component Value Date   CREATININE 0.88 04/06/2020   BUN 13 04/06/2020   NA 142 04/06/2020   K 4.2 04/06/2020   CL 104 04/06/2020   CO2 22 04/06/2020   Lab Results  Component Value Date   CHOL 184 04/06/2020   HDL 46 04/06/2020   LDLCALC 123 (H) 04/06/2020   TRIG 79 04/06/2020   CHOLHDL 4.1 08/07/2019   No results found for: TSH No results found for: HGBA1C Lab Results  Component Value Date   WBC 5.9 03/13/2020   HGB 14.1 03/13/2020   HCT 44.3 03/13/2020   MCV 79.4 (L) 03/13/2020   PLT 169 03/13/2020   Lab Results  Component Value Date   ALT 10 04/06/2020   AST 10 04/06/2020   ALKPHOS 147 (H) 04/06/2020   BILITOT 0.2 04/06/2020     Review of Systems  Constitutional: Negative for chills, fatigue, fever, malaise/fatigue and weight loss.  HENT: Negative for drooling, ear discharge, ear pain, hoarse voice and sore throat.   Eyes: Negative for blurred vision.  Respiratory: Negative for cough, choking, shortness of breath and wheezing.   Cardiovascular: Negative for chest pain, palpitations, orthopnea, leg swelling and PND.  Gastrointestinal: Negative for abdominal pain, blood in stool, bowel incontinence, constipation, diarrhea, dysphagia, heartburn, melena and nausea.  Endocrine: Negative for polydipsia.  Genitourinary: Negative for bladder incontinence, dysuria, frequency, hematuria, pelvic pain and urgency.  Musculoskeletal: Negative for back pain, myalgias, muscle weakness and neck pain.  Skin:  Negative for rash.  Allergic/Immunologic: Negative for environmental allergies.  Neurological: Negative for dizziness, tingling, weakness, numbness, headaches and  paresthesias.  Hematological: Does not bruise/bleed easily.  Psychiatric/Behavioral: Negative for suicidal ideas. The patient is not nervous/anxious.     Patient Active Problem List   Diagnosis Date Noted  . Herniated nucleus pulposus, lumbar 03/16/2020  . Spinal stenosis of lumbar region 03/16/2020  . HNP (herniated nucleus pulposus), lumbar 06/12/2019  . UTI due to Klebsiella species 05/08/2019  . Biceps tendonitis on right 07/27/2018  . Primary osteoarthritis of right knee 10/12/2017  . Taking multiple medications for chronic disease 07/30/2015  . Bronchitis 07/01/2015  . Reactive airway disease 07/01/2015  . Allergic sinusitis 07/01/2015  . Essential hypertension 07/01/2015  . Valvular heart disease 07/01/2015  . Metatarsalgia of left foot 06/10/2015  . [place] arthritis 12/15/2013    Allergies  Allergen Reactions  . Mobic [Meloxicam] Palpitations    Past Surgical History:  Procedure Laterality Date  . BACK SURGERY  2018  . COLONOSCOPY    . HERNIA REPAIR    . JOINT REPLACEMENT Left     x2  . LITHOTRIPSY    . [place] SURGERY  47/14/2020  . Lumbar disectomy  01/18/2019  . TRANSFORAMINAL LUMBAR INTERBODY FUSION (TLIF) WITH PEDICLE SCREW FIXATION 1 LEVEL N/A 06/12/2019   Procedure: LUMBAR FOUR-FIVE TRANSFORAMINAL LUMBAR INTERBODY FUSION (TLIF) WITH PEDICLE SCREW FIXATION;  Surgeon: Kary Kos, MD;  Location: [facility];  Service: Neurosurgery;  Laterality: N/A;    Social History   Tobacco Use  . Smoking status: Former Research scientist (life sciences)  . Smokeless tobacco: Former Systems developer    Types: Chew    Quit date: 12/2018  . Tobacco comment: Quit Fall 2020  Vaping Use  . Vaping Use: Never used  Substance Use Topics  . Alcohol use: Yes    Comment: 2 drinks a month  . Drug use: Never     Medication list has been reviewed and updated.  Current Meds  Medication Sig  . apixaban (ELIQUIS) 5 MG TABS tablet Take 5 mg by mouth 2 (two) times daily.  . celecoxib (CELEBREX) 200 MG capsule Take 1  capsule (200 mg total) by mouth daily.  Marland Kitchen diltiazem (CARDIZEM CD) 120 MG 24 hr capsule Take 1 capsule (120 mg total) by mouth daily. (Patient taking differently: Take 120 mg by mouth in the morning and at bedtime. )  . fexofenadine (ALLEGRA) 180 MG tablet Take 1 tablet (180 mg total) by mouth daily.  . fluticasone (FLONASE) 50 MCG/ACT nasal spray Use 1 spray(s) in each nostril once daily  . hydrochlorothiazide (HYDRODIURIL) 12.5 MG tablet Take 1 tablet by mouth once daily  . lisinopril (ZESTRIL) 20 MG tablet Take 1 tablet by mouth in the evening  . metoprolol tartrate (LOPRESSOR) 50 MG tablet Take 25 mg by mouth 2 (two) times daily.   . montelukast (SINGULAIR) 10 MG tablet Take 1 tablet (10 mg total) by mouth at bedtime.  . Multiple Vitamins-Minerals (MULTIVITAMIN WITH MINERALS) tablet Take 1 tablet by mouth every evening.   . Olopatadine HCl (PATADAY) 0.2 % SOLN Place 1 drop into both eyes 2 (two) times daily as needed (allergies.).  Marland Kitchen pantoprazole (PROTONIX) 40 MG tablet Take 1 tablet (40 mg total) by mouth daily.  . potassium chloride SA (KLOR-CON) 20 MEQ tablet Take 3 tablets (60 mEq total) by mouth daily.  . tamsulosin (FLOMAX) 0.4 MG CAPS capsule TAKE ONE (1) CAPSULE EACH DAY. (Patient taking differently: Take 0.4 mg by mouth daily. )  PHQ 2/9 Scores 09/14/2020 05/26/2020 04/06/2020 10/02/2019  PHQ - 2 Score 0 0 0 0  PHQ- 9 Score 0 0 0 0    GAD 7 : Generalized Anxiety Score 09/14/2020 04/06/2020 09/03/2019  Nervous, Anxious, on Edge 0 0 0  Control/stop worrying 0 0 0  Worry too much - different things 0 0 0  Trouble relaxing 0 0 0  Restless 0 0 0  Easily annoyed or irritable 0 0 0  Afraid - awful might happen 0 0 0  Total GAD 7 Score 0 0 0    BP Readings from Last 3 Encounters:  09/14/20 138/80  05/26/20 (!) 148/82  04/06/20 (!) 130/100    Physical Exam Vitals and nursing note reviewed.  HENT:     Head: Normocephalic.     Right Ear: Tympanic membrane, ear canal and  external ear normal.     Left Ear: Tympanic membrane, ear canal and external ear normal.     Nose: Nose normal.     Mouth/Throat:     Mouth: Mucous membranes are moist.  Eyes:     General: No scleral icterus.       Right eye: No discharge.        Left eye: No discharge.     Conjunctiva/sclera: Conjunctivae normal.     Pupils: Pupils are equal, round, and reactive to light.  Neck:     Thyroid: No thyromegaly.     Vascular: No JVD.     Trachea: No tracheal deviation.  Cardiovascular:     Rate and Rhythm: Normal rate and regular rhythm.     Heart sounds: Normal heart sounds. No murmur heard.  No friction rub. No gallop.   Pulmonary:     Effort: No respiratory distress.     Breath sounds: Normal breath sounds. No wheezing, rhonchi or rales.  Abdominal:     General: Bowel sounds are normal. There is no distension.     Palpations: Abdomen is soft. There is no mass.     Tenderness: There is no abdominal tenderness. There is no guarding or rebound.     Hernia: No hernia is present.  Musculoskeletal:        General: No tenderness. Normal range of motion.     Cervical back: Normal range of motion and neck supple.  Lymphadenopathy:     Cervical: No cervical adenopathy.  Skin:    General: Skin is warm.     Capillary Refill: Capillary refill takes less than 2 seconds.     Findings: No bruising, erythema or rash.  Neurological:     Mental Status: He is alert and oriented to person, place, and time.     Cranial Nerves: No cranial nerve deficit.     Deep Tendon Reflexes: Reflexes are normal and symmetric.     Wt Readings from Last 3 Encounters:  09/14/20 249 lb (112.9 kg)  05/26/20 246 lb (111.6 kg)  04/06/20 238 lb (108 kg)    BP 138/80   Pulse 80   Ht 6\' 1"  (1.854 m)   Wt 249 lb (112.9 kg)   BMI 32.85 kg/m   Assessment and Plan: Patient's chart was reviewed prior to being seen for review of previous encounters, most recent labs, most recent imaging, and care everywhere. 1.  Primary osteoarthritis of left knee Chronic.  Controlled.  Stable.  At this point we will continue Celebrex 200 mg once a day.  Patient's been instructed to watch for melena or abdominal discomfort indicating the  possibility of upper GI bleed since he is on Eliquis per cardiology. - celecoxib (CELEBREX) 200 MG capsule; Take 1 capsule (200 mg total) by mouth daily.  Dispense: 30 capsule; Refill: 5  2. Degenerative disc disease, lumbar Chronic.  Controlled.  Stable.  Will continue Celebrex as noted above. - celecoxib (CELEBREX) 200 MG capsule; Take 1 capsule (200 mg total) by mouth daily.  Dispense: 30 capsule; Refill: 5  3. Essential hypertension Chronic.  Controlled.  Stable.  Patient's blood pressure today was noted to be 138/80.  Continue diltiazem CD 120 mg once a day and hydrochlorothiazide 12.5 mg once a day.  In addition patient will also continue lisinopril 20 mg once a day. - diltiazem (CARDIZEM CD) 120 MG 24 hr capsule; Take 1 capsule (120 mg total) by mouth in the morning and at bedtime.  Dispense: 180 capsule; Refill: 1 - hydrochlorothiazide (HYDRODIURIL) 12.5 MG tablet; Take 1 tablet (12.5 mg total) by mouth daily.  Dispense: 90 tablet; Refill: 1 - lisinopril (ZESTRIL) 20 MG tablet; Take 1 tablet (20 mg total) by mouth every evening.  Dispense: 90 tablet; Refill: 1  4. Other seasonal allergic rhinitis Chronic.  Controlled.  Stable.  Continue fexofenadine 180 mg daily as well as Flonase nasal spray 1 spray each nostril daily. - fexofenadine (ALLEGRA) 180 MG tablet; Take 1 tablet (180 mg total) by mouth daily.  Dispense: 90 tablet; Refill: 1 - fluticasone (FLONASE) 50 MCG/ACT nasal spray; Use 1 spray(s) in each nostril once daily  Dispense: 16 g; Refill: 1  5. Generalized edema Chronic.  Controlled.  Stable.  No significant edema was noted on exam today.  We will continue hydrochlorothiazide 12.5 mg once a day. - hydrochlorothiazide (HYDRODIURIL) 12.5 MG tablet; Take 1 tablet (12.5 mg  total) by mouth daily.  Dispense: 90 tablet; Refill: 1  6. Valvular heart disease As noted above patient is followed by cardiology and will be continuing above cardiac medications. - hydrochlorothiazide (HYDRODIURIL) 12.5 MG tablet; Take 1 tablet (12.5 mg total) by mouth daily.  Dispense: 90 tablet; Refill: 1  7. Seasonal allergic rhinitis, unspecified trigger Chronic.  Controlled.  Stable.  In addition to occasional long-acting antihistamine patient will continue Singulair 10 mg once a day. - montelukast (SINGULAIR) 10 MG tablet; Take 1 tablet (10 mg total) by mouth at bedtime.  Dispense: 90 tablet; Refill: 1  8. Gastroesophageal reflux disease, unspecified whether esophagitis present Chronic.  Controlled.  Stable.  Continue pantoprazole 40 mg once a day. - pantoprazole (PROTONIX) 40 MG tablet; Take 1 tablet (40 mg total) by mouth daily.  Dispense: 90 tablet; Refill: 1  9. Hypokalemia Chronic.  Controlled.  Stable.  This is iatrogenic due to the use of diuretics.  We will continue potassium chloride sustained release 20 mEq 3 tablets daily. - potassium chloride SA (KLOR-CON) 20 MEQ tablet; Take 3 tablets (60 mEq total) by mouth daily.  Dispense: 270 tablet; Refill: 1  I spent 32 minutes with this patient, More than 50% of that time was spent in face to face education, counseling and care coordination.  During this time there were care for this patient today included reviewing labs records from another facility seen the patient and documenting in the record.  Patient had prescriptions refilled and previous upcoming appointments were reviewed.

## 2020-09-15 DIAGNOSIS — M5440 Lumbago with sciatica, unspecified side: Secondary | ICD-10-CM | POA: Diagnosis not present

## 2020-09-15 DIAGNOSIS — I10 Essential (primary) hypertension: Secondary | ICD-10-CM | POA: Diagnosis not present

## 2020-09-15 DIAGNOSIS — Z6832 Body mass index (BMI) 32.0-32.9, adult: Secondary | ICD-10-CM | POA: Diagnosis not present

## 2020-09-22 ENCOUNTER — Encounter: Payer: Self-pay | Admitting: Urology

## 2020-09-22 ENCOUNTER — Ambulatory Visit: Payer: Medicare PPO | Admitting: Urology

## 2020-09-22 ENCOUNTER — Other Ambulatory Visit
Admission: RE | Admit: 2020-09-22 | Discharge: 2020-09-22 | Disposition: A | Discharge status: Home / Self Care | Payer: Medicare PPO | Attending: Urology | Admitting: Urology

## 2020-09-22 ENCOUNTER — Other Ambulatory Visit: Payer: Self-pay

## 2020-09-22 VITALS — BP 171/123 | HR 66 | Ht 73.0 in | Wt 249.0 lb

## 2020-09-22 DIAGNOSIS — Z125 Encounter for screening for malignant neoplasm of prostate: Secondary | ICD-10-CM

## 2020-09-22 DIAGNOSIS — N529 Male erectile dysfunction, unspecified: Secondary | ICD-10-CM

## 2020-09-22 DIAGNOSIS — R972 Elevated prostate specific antigen [PSA]: Secondary | ICD-10-CM

## 2020-09-22 DIAGNOSIS — N138 Other obstructive and reflux uropathy: Secondary | ICD-10-CM

## 2020-09-22 DIAGNOSIS — N401 Enlarged prostate with lower urinary tract symptoms: Secondary | ICD-10-CM | POA: Diagnosis not present

## 2020-09-22 DIAGNOSIS — R351 Nocturia: Secondary | ICD-10-CM

## 2020-09-22 LAB — BLADDER SCAN AMB NON-IMAGING: Scan Result: 99

## 2020-09-22 MED ORDER — TAMSULOSIN HCL 0.4 MG PO CAPS: 0.4000 mg | ORAL_CAPSULE | Freq: Every day | ORAL | 3 refills | Status: DC

## 2020-09-22 MED ORDER — TADALAFIL 5 MG PO TABS: 5.0000 mg | ORAL_TABLET | Freq: Every day | ORAL | 11 refills | Status: DC | PRN

## 2020-09-22 NOTE — Patient Instructions (Addendum)
Prostate Cancer Screening  Prostate cancer screening is a test that is done to check for the presence of prostate cancer in men. The prostate gland is a walnut-sized gland that is located below the bladder and in front of the rectum in males. The function of the prostate is to add fluid to semen during ejaculation. Prostate cancer is the second most common type of cancer in men. Who should have prostate cancer screening?  Screening recommendations vary based on age and other risk factors. Screening is recommended if:  You are older than age 55. If you are age 55-69, talk with your health care provider about your need for screening and how often screening should be done. Because most prostate cancers are slow growing and will not cause death, screening is generally reserved in this age group for men who have a 10-15-year life expectancy.  You are younger than age 55, and you have these risk factors: ? Being a black male or a male of African descent. ? Having a father, brother, or uncle who has been diagnosed with prostate cancer. The risk is higher if your family member's cancer occurred at an early age. Screening is not recommended if:  You are younger than age 40.  You are between the ages of 40 and 54 and you have no risk factors.  You are 70 years of age or older. At this age, the risks that screening can cause are greater than the benefits that it may provide. If you are at high risk for prostate cancer, your health care provider may recommend that you have screenings more often or that you start screening at a younger age. How is screening for prostate cancer done? The recommended prostate cancer screening test is a blood test called the prostate-specific antigen (PSA) test. PSA is a protein that is made in the prostate. As you age, your prostate naturally produces more PSA. Abnormally high PSA levels may be caused by:  Prostate cancer.  An enlarged prostate that is not caused by cancer  (benign prostatic hyperplasia, BPH). This condition is very common in older men.  A prostate gland infection (prostatitis). Depending on the PSA results, you may need more tests, such as:  A physical exam to check the size of your prostate gland.  Blood and imaging tests.  A procedure to remove tissue samples from your prostate gland for testing (biopsy). What are the benefits of prostate cancer screening?  Screening can help to identify cancer at an early stage, before symptoms start and when the cancer can be treated more easily.  There is a small chance that screening may lower your risk of dying from prostate cancer. The chance is small because prostate cancer is a slow-growing cancer, and most men with prostate cancer die from a different cause. What are the risks of prostate cancer screening? The main risk of prostate cancer screening is diagnosing and treating prostate cancer that would never have caused any symptoms or problems. This is called overdiagnosisand overtreatment. PSA screening cannot tell you if your PSA is high due to cancer or a different cause. A prostate biopsy is the only procedure to diagnose prostate cancer. Even the results of a biopsy may not tell you if your cancer needs to be treated. Slow-growing prostate cancer may not need any treatment other than monitoring, so diagnosing and treating it may cause unnecessary stress or other side effects. A prostate biopsy may also cause:  Infection or fever.  A false negative. This is   a result that shows that you do not have prostate cancer when you actually do have prostate cancer. Questions to ask your health care provider  When should I start prostate cancer screening?  What is my risk for prostate cancer?  How often do I need screening?  What type of screening tests do I need?  How do I get my test results?  What do my results mean?  Do I need treatment? Where to find more information  The American Cancer  Society: www.cancer.org  American Urological Association: www.auanet.org Contact a health care provider if:  You have difficulty urinating.  You have pain when you urinate or ejaculate.  You have blood in your urine or semen.  You have pain in your back or in the area of your prostate. Summary  Prostate cancer is a common type of cancer in men. The prostate gland is located below the bladder and in front of the rectum. This gland adds fluid to semen during ejaculation.  Prostate cancer screening may identify cancer at an early stage, when the cancer can be treated more easily.  The prostate-specific antigen (PSA) test is the recommended screening test for prostate cancer.  Discuss the risks and benefits of prostate cancer screening with your health care provider. If you are age 23 or older, the risks that screening can cause are greater than the benefits that it may provide. This information is not intended to replace advice given to you by your health care provider. Make sure you discuss any questions you have with your health care provider. Document Revised: 05/23/2019 Document Reviewed: 05/23/2019 Elsevier Patient Education  [place].  Tadalafil tablets (Cialis) What is this medicine? TADALAFIL (tah DA la fil) is used to treat erection problems in men. It is also used for enlargement of the prostate gland in men, a condition called benign prostatic hyperplasia or BPH. This medicine improves urine flow and reduces BPH symptoms. This medicine can also treat both erection problems and BPH when they occur together. This medicine may be used for other purposes; ask your health care provider or pharmacist if you have questions. COMMON BRAND NAME(S): Kathaleen Bury, Cialis What should I tell my health care provider before I take this medicine? They need to know if you have any of these conditions:  bleeding disorders  eye or vision problems, including a rare inherited eye  disease called retinitis pigmentosa  anatomical deformation of the penis, Peyronie's disease, or history of priapism (painful and prolonged erection)  heart disease, angina, a history of heart attack, irregular heart beats, or other heart problems  high or low blood pressure  history of blood diseases, like sickle cell anemia or leukemia  history of stomach bleeding  kidney disease  liver disease  stroke  an unusual or allergic reaction to tadalafil, other medicines, foods, dyes, or preservatives  pregnant or trying to get pregnant  breast-feeding How should I use this medicine? Take this medicine by mouth with a glass of water. Follow the directions on the prescription label. You may take this medicine with or without meals. When this medicine is used for erection problems, your doctor may prescribe it to be taken once daily or as needed. If you are taking the medicine as needed, you may be able to have sexual activity 30 minutes after taking it and for up to 36 hours after taking it. Whether you are taking the medicine as needed or once daily, you should not take more than one dose  per day. If you are taking this medicine for symptoms of benign prostatic hyperplasia (BPH) or to treat both BPH and an erection problem, take the dose once daily at about the same time each day. Do not take your medicine more often than directed. Talk to your pediatrician regarding the use of this medicine in children. Special care may be needed. Overdosage: If you think you have taken too much of this medicine contact a poison control center or emergency room at once. NOTE: This medicine is only for you. Do not share this medicine with others. What if I miss a dose? If you are taking this medicine as needed for erection problems, this does not apply. If you miss a dose while taking this medicine once daily for an erection problem, benign prostatic hyperplasia, or both, take it as soon as you remember, but  do not take more than one dose per day. What may interact with this medicine? Do not take this medicine with any of the following medications:  nitrates like amyl nitrite, isosorbide dinitrate, isosorbide mononitrate, nitroglycerin  other medicines for erectile dysfunction like avanafil, sildenafil, vardenafil  other tadalafil products (Adcirca)  riociguat This medicine may also interact with the following medications:  certain drugs for high blood pressure  certain drugs for the treatment of HIV infection or AIDS  certain drugs used for fungal or yeast infections, like fluconazole, itraconazole, ketoconazole, and voriconazole  certain drugs used for seizures like carbamazepine, phenytoin, and phenobarbital  grapefruit juice  macrolide antibiotics like clarithromycin, erythromycin, troleandomycin  medicines for prostate problems  rifabutin, rifampin or rifapentine This list may not describe all possible interactions. Give your health care provider a list of all the medicines, herbs, non-prescription drugs, or dietary supplements you use. Also tell them if you smoke, drink alcohol, or use illegal drugs. Some items may interact with your medicine. What should I watch for while using this medicine? If you notice any changes in your vision while taking this drug, call your doctor or health care professional as soon as possible. Stop using this medicine and call your health care provider right away if you have a loss of sight in one or both eyes. Contact your doctor or health care professional right away if the erection lasts longer than 4 hours or if it becomes painful. This may be a sign of serious problem and must be treated right away to prevent permanent damage. If you experience symptoms of nausea, dizziness, chest pain or arm pain upon initiation of sexual activity after taking this medicine, you should refrain from further activity and call your doctor or health care professional  as soon as possible. Do not drink alcohol to excess (examples, 5 glasses of wine or 5 shots of whiskey) when taking this medicine. When taken in excess, alcohol can increase your chances of getting a headache or getting dizzy, increasing your heart rate or lowering your blood pressure. Using this medicine does not protect you or your partner against HIV infection (the virus that causes AIDS) or other sexually transmitted diseases. What side effects may I notice from receiving this medicine? Side effects that you should report to your doctor or health care professional as soon as possible:  allergic reactions like skin rash, itching or hives, swelling of the face, lips, or tongue  breathing problems  changes in hearing  changes in vision  chest pain  fast, irregular heartbeat  prolonged or painful erection  seizures Side effects that usually do not require medical  attention (report to your doctor or health care professional if they continue or are bothersome):  back pain  dizziness  flushing  headache  indigestion  muscle aches  nausea  stuffy or runny nose This list may not describe all possible side effects. Call your doctor for medical advice about side effects. You may report side effects to FDA at 1-800-FDA-1088. Where should I keep my medicine? Keep out of the reach of children. Store at room temperature between 15 and 30 degrees C (59 and 86 degrees F). Throw away any unused medicine after the expiration date. NOTE: This sheet is a summary. It may not cover all possible information. If you have questions about this medicine, talk to your doctor, pharmacist, or health care provider.  2020 Elsevier/Gold Standard (2014-02-28 13:15:49)

## 2020-09-22 NOTE — Progress Notes (Signed)
   09/22/2020 1:18 PM   RAY GERVASI 1955-11-15 [id number]  Reason for visit: Follow up elevated PSA, BPH, ED  HPI: I saw [name] back in clinic for follow-up of the above issues.  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  PSA remains elevated at 11, and 10.4 in follow-up when he was seeing Zara Council, PA, and she ordered a prostate MRI.  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  His PSA trended down and was 4.5 in October 2020.  I suspect his elevated PSA values were secondary to acute infection after his episode of retention.  He has not yet had a repeat PSA this year.  He has been on Flomax since his episode of retention, he feels this helps with his urinary symptoms.  His primary urinary complaint is some occasional intermittency, and IPSS score today is 4, with quality of life pleased, and PVR is normal at 99 mL.  He denies any gross hematuria or UTIs.  He also has noted some difficulty with erections over the last year, and is interested in trying medications.  Recent testosterone with PCP was normal at 453.  He has never tried any medications for this before.  Trial of Cialis for ED, risks and benefits discussed Continue Flomax PSA today, call with results RTC 1 year for ongoing PSA screening, ED and BPH symptom check  [name], MD  [facility] [address], [place], [place] [address] [phone]

## 2020-09-24 ENCOUNTER — Telehealth: Payer: Self-pay

## 2020-09-24 DIAGNOSIS — R972 Elevated prostate specific antigen [PSA]: Secondary | ICD-10-CM

## 2020-09-24 LAB — PSA (REFLEX TO FREE) (SERIAL): Prostate Specific Ag, Serum: 2.4 ng/mL (ref 0.0–4.0)

## 2020-09-24 NOTE — Telephone Encounter (Signed)
Called pt no answer. LM for pt per DPR informing him of the information below. Advised to call back for questions or concerns. Lab ordered placed for [place] lab, appt scheduled for [place].

## 2020-09-24 NOTE — Telephone Encounter (Signed)
-----   Message from [name], MD sent at 09/24/2020  8:43 AM EST ----- Doristine Devoid news, PSA has dropped all the way back down to normal at 2.4.  Keep follow-up in 1 year with PSA prior  Nickolas Madrid, MD 09/24/2020

## 2020-09-29 DIAGNOSIS — I10 Essential (primary) hypertension: Secondary | ICD-10-CM | POA: Diagnosis not present

## 2020-09-29 DIAGNOSIS — E782 Mixed hyperlipidemia: Secondary | ICD-10-CM | POA: Diagnosis not present

## 2020-09-29 DIAGNOSIS — I480 Paroxysmal atrial fibrillation: Secondary | ICD-10-CM | POA: Diagnosis not present

## 2020-10-22 DIAGNOSIS — M5116 Intervertebral disc disorders with radiculopathy, lumbar region: Secondary | ICD-10-CM | POA: Diagnosis not present

## 2020-10-22 DIAGNOSIS — Z7901 Long term (current) use of anticoagulants: Secondary | ICD-10-CM | POA: Diagnosis not present

## 2020-10-22 DIAGNOSIS — I4891 Unspecified atrial fibrillation: Secondary | ICD-10-CM | POA: Diagnosis not present

## 2020-10-22 DIAGNOSIS — Z981 Arthrodesis status: Secondary | ICD-10-CM | POA: Diagnosis not present

## 2020-10-22 DIAGNOSIS — Z79891 Long term (current) use of opiate analgesic: Secondary | ICD-10-CM | POA: Diagnosis not present

## 2020-10-22 DIAGNOSIS — M5117 Intervertebral disc disorders with radiculopathy, lumbosacral region: Secondary | ICD-10-CM | POA: Diagnosis not present

## 2020-10-22 DIAGNOSIS — I10 Essential (primary) hypertension: Secondary | ICD-10-CM | POA: Diagnosis not present

## 2020-10-22 DIAGNOSIS — M4726 Other spondylosis with radiculopathy, lumbar region: Secondary | ICD-10-CM | POA: Diagnosis not present

## 2020-11-26 ENCOUNTER — Telehealth: Payer: Self-pay

## 2020-11-26 ENCOUNTER — Other Ambulatory Visit: Payer: Self-pay

## 2020-11-26 DIAGNOSIS — B379 Candidiasis, unspecified: Secondary | ICD-10-CM

## 2020-11-26 DIAGNOSIS — T3695XA Adverse effect of unspecified systemic antibiotic, initial encounter: Secondary | ICD-10-CM

## 2020-11-26 MED ORDER — FLUCONAZOLE 150 MG PO TABS: 150.0000 mg | ORAL_TABLET | Freq: Once | ORAL | 0 refills | Status: AC

## 2020-11-26 NOTE — Progress Notes (Unsigned)
Sent in diflucan

## 2020-11-26 NOTE — Telephone Encounter (Signed)
Sent in diflucan- scheduled to see in the morning

## 2020-11-26 NOTE — Telephone Encounter (Signed)
Copied from [place] [id number]. Topic: General - Other >> Nov 26, 2020  9:34 AM Keene Breath wrote: Reason for CRM: Patient's wife called to ask Baxter Flattery or the doctor if they could send in a script for the Augmentin that the patient has taken before for a possible yeast infection, which she believes he has.  Please advise and let patient know if this is possible or does he need an appt.  CB# [phone]

## 2020-11-27 ENCOUNTER — Other Ambulatory Visit: Payer: Self-pay

## 2020-11-27 ENCOUNTER — Ambulatory Visit: Payer: Medicare PPO | Admitting: Family Medicine

## 2020-11-27 ENCOUNTER — Encounter: Payer: Self-pay | Admitting: Family Medicine

## 2020-11-27 VITALS — BP 130/84 | HR 80 | Ht 61.0 in | Wt 249.0 lb

## 2020-11-27 DIAGNOSIS — R300 Dysuria: Secondary | ICD-10-CM

## 2020-11-27 NOTE — Progress Notes (Signed)
Date:  11/27/2020   Name:  [name]   DOB:  24-Dec-1955   MRN:  [id number]   Chief Complaint: yeast infection after antibiotic (Took a round of Augmentin from dentist- has an odor of "cheerios" and burning. After taking diflucan yesterday- hasn't noticed the smell)  Urinary Tract Infection  This is a new problem. The current episode started yesterday. The problem has been gradually improving. The quality of the pain is described as burning. There has been no fever. Pertinent negatives include no chills, discharge, flank pain, frequency, hematuria, hesitancy, nausea, sweats, urgency or vomiting. He has tried antibiotics (augmentin for dental) for the symptoms. The treatment provided moderate relief.    Lab Results  Component Value Date   CREATININE 0.88 04/06/2020   BUN 13 04/06/2020   NA 142 04/06/2020   K 4.2 04/06/2020   CL 104 04/06/2020   CO2 22 04/06/2020   Lab Results  Component Value Date   CHOL 184 04/06/2020   HDL 46 04/06/2020   LDLCALC 123 (H) 04/06/2020   TRIG 79 04/06/2020   CHOLHDL 4.1 08/07/2019   No results found for: TSH No results found for: HGBA1C Lab Results  Component Value Date   WBC 5.9 03/13/2020   HGB 14.1 03/13/2020   HCT 44.3 03/13/2020   MCV 79.4 (L) 03/13/2020   PLT 169 03/13/2020   Lab Results  Component Value Date   ALT 10 04/06/2020   AST 10 04/06/2020   ALKPHOS 147 (H) 04/06/2020   BILITOT 0.2 04/06/2020     Review of Systems  Constitutional: Negative for chills and fever.  HENT: Negative for drooling, ear discharge, ear pain and sore throat.   Respiratory: Negative for cough, shortness of breath and wheezing.   Cardiovascular: Negative for chest pain, palpitations and leg swelling.  Gastrointestinal: Negative for abdominal pain, blood in stool, constipation, diarrhea, nausea and vomiting.  Endocrine: Negative for polydipsia.  Genitourinary: Negative for dysuria, flank pain, frequency, hematuria, hesitancy and urgency.   Musculoskeletal: Negative for back pain, myalgias and neck pain.  Skin: Negative for rash.  Allergic/Immunologic: Negative for environmental allergies.  Neurological: Negative for dizziness and headaches.  Hematological: Does not bruise/bleed easily.  Psychiatric/Behavioral: Negative for suicidal ideas. The patient is not nervous/anxious.     Patient Active Problem List   Diagnosis Date Noted  . Herniated nucleus pulposus, lumbar 03/16/2020  . Spinal stenosis of lumbar region 03/16/2020  . HNP (herniated nucleus pulposus), lumbar 06/12/2019  . UTI due to Klebsiella species 05/08/2019  . Biceps tendonitis on right 07/27/2018  . Primary osteoarthritis of right knee 10/12/2017  . Taking multiple medications for chronic disease 07/30/2015  . Bronchitis 07/01/2015  . Reactive airway disease 07/01/2015  . Allergic sinusitis 07/01/2015  . Essential hypertension 07/01/2015  . Valvular heart disease 07/01/2015  . Metatarsalgia of left foot 06/10/2015  . 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 Mobic [Meloxicam] Palpitations    Past Surgical History:  Procedure Laterality Date  . BACK SURGERY  2018  . COLONOSCOPY    . HERNIA REPAIR    . JOINT REPLACEMENT Left     x2  . LITHOTRIPSY    . LUMBAR DISC SURGERY  47/14/2020  . Lumbar disectomy  01/18/2019  . TRANSFORAMINAL LUMBAR INTERBODY FUSION (TLIF) WITH PEDICLE SCREW FIXATION 1 LEVEL N/A 06/12/2019   Procedure: LUMBAR FOUR-FIVE TRANSFORAMINAL LUMBAR INTERBODY FUSION (TLIF) WITH PEDICLE SCREW FIXATION;  Surgeon: Donalee Citrinram, Gary, MD;  Location: [facility] OR;  Service: Neurosurgery;  Laterality: N/A;    Social History   Tobacco Use  . Smoking status: Former Research scientist (life sciences)  . Smokeless tobacco: Former Systems developer    Types: Chew    Quit date: 12/2018  . Tobacco comment: Quit Fall 2020  Vaping Use  . Vaping Use: Never used  Substance Use Topics  . Alcohol use: Yes    Comment: 2 drinks a month  . Drug use: Never     Medication  list has been reviewed and updated.  Current Meds  Medication Sig  . apixaban (ELIQUIS) 5 MG TABS tablet Take 5 mg by mouth 2 (two) times daily.  . celecoxib (CELEBREX) 200 MG capsule Take 1 capsule (200 mg total) by mouth daily.  . chlorproMAZINE (THORAZINE) 25 MG tablet Take 1 tablet (25 mg total) by mouth 4 (four) times daily as needed for hiccoughs.  . diltiazem (CARDIZEM CD) 120 MG 24 hr capsule Take 1 capsule (120 mg total) by mouth in the morning and at bedtime.  . fexofenadine (ALLEGRA) 180 MG tablet Take 1 tablet (180 mg total) by mouth daily.  . fluticasone (FLONASE) 50 MCG/ACT nasal spray Use 1 spray(s) in each nostril once daily  . hydrochlorothiazide (HYDRODIURIL) 12.5 MG tablet Take 1 tablet (12.5 mg total) by mouth daily.  Marland Kitchen lisinopril (ZESTRIL) 20 MG tablet Take 1 tablet (20 mg total) by mouth every evening.  . metoprolol tartrate (LOPRESSOR) 50 MG tablet Take 0.5 tablets (25 mg total) by mouth 2 (two) times daily.  . montelukast (SINGULAIR) 10 MG tablet Take 1 tablet (10 mg total) by mouth at bedtime.  . Multiple Vitamins-Minerals (MULTIVITAMIN WITH MINERALS) tablet Take 1 tablet by mouth every evening.   . Olopatadine HCl 0.2 % SOLN Place 1 drop into both eyes 2 (two) times daily as needed (allergies.).  Marland Kitchen pantoprazole (PROTONIX) 40 MG tablet Take 1 tablet (40 mg total) by mouth daily.  . potassium chloride SA (KLOR-CON) 20 MEQ tablet Take 3 tablets (60 mEq total) by mouth daily.  . tadalafil (CIALIS) 5 MG tablet Take 1-2 tablets (5-10 mg total) by mouth daily as needed for erectile dysfunction.  . tamsulosin (FLOMAX) 0.4 MG CAPS capsule Take 1 capsule (0.4 mg total) by mouth daily.  . traMADol (ULTRAM) 50 MG tablet Take 50 mg by mouth 2 (two) times daily as needed for pain.     PHQ 2/9 Scores 09/14/2020 09/14/2020 05/26/2020 04/06/2020  PHQ - 2 Score 0 0 0 0  PHQ- 9 Score - 0 0 0    GAD 7 : Generalized Anxiety Score 09/14/2020 04/06/2020 09/03/2019  Nervous, Anxious, on  Edge 0 0 0  Control/stop worrying 0 0 0  Worry too much - different things 0 0 0  Trouble relaxing 0 0 0  Restless 0 0 0  Easily annoyed or irritable 0 0 0  Afraid - awful might happen 0 0 0  Total GAD 7 Score 0 0 0    BP Readings from Last 3 Encounters:  11/27/20 130/84  09/22/20 (!) 171/123  09/14/20 138/80    Physical Exam Vitals and nursing note reviewed.  HENT:     Head: Normocephalic.     Right Ear: Tympanic membrane and external ear normal.     Left Ear: Tympanic membrane and external ear normal.     Nose: Nose normal. No congestion or rhinorrhea.     Mouth/Throat:     Mouth: Oropharynx is clear and moist. Mucous membranes are moist.  Eyes:  General: No scleral icterus.       Right eye: No discharge.        Left eye: No discharge.     Extraocular Movements: EOM normal.     Conjunctiva/sclera: Conjunctivae normal.     Pupils: Pupils are equal, round, and reactive to light.  Neck:     Thyroid: No thyromegaly.     Vascular: No JVD.     Trachea: No tracheal deviation.  Cardiovascular:     Rate and Rhythm: Normal rate and regular rhythm.     Pulses: Intact distal pulses.     Heart sounds: Normal heart sounds. No murmur heard. No friction rub. No gallop.   Pulmonary:     Effort: No respiratory distress.     Breath sounds: Normal breath sounds. No wheezing, rhonchi or rales.  Abdominal:     General: Bowel sounds are normal.     Palpations: Abdomen is soft. There is no hepatosplenomegaly or mass.     Tenderness: There is no abdominal tenderness. There is no CVA tenderness, guarding or rebound.  Musculoskeletal:        General: No tenderness or edema. Normal range of motion.     Cervical back: Normal range of motion and neck supple.  Lymphadenopathy:     Cervical: No cervical adenopathy.  Skin:    General: Skin is warm.     Findings: No rash.  Neurological:     Mental Status: He is alert and oriented to person, place, and time.     Cranial Nerves: No cranial  nerve deficit.     Deep Tendon Reflexes: Strength normal and reflexes are normal and symmetric.     Wt Readings from Last 3 Encounters:  11/27/20 249 lb (112.9 kg)  09/22/20 249 lb (112.9 kg)  09/14/20 249 lb (112.9 kg)    BP 130/84   Pulse 80   Ht 5\' 1"  (1.549 m)   Wt 249 lb (112.9 kg)   BMI 47.05 kg/m   Assessment and Plan:  1. Dysuria New onset.  This is status post antibiotic treatment of Augmentin for a dental concern.  Shortly afterwards patient experiencing dysuria with a odor to the urine.  Patient was unable to get a specimen for examination today but exam was negative for CVA tenderness and suprapubic tenderness.  We treated with Diflucan 150 mg tablet x1 and symptoms has resolved.

## 2020-11-29 ENCOUNTER — Other Ambulatory Visit: Payer: Self-pay | Admitting: Family Medicine

## 2020-11-29 DIAGNOSIS — J302 Other seasonal allergic rhinitis: Secondary | ICD-10-CM

## 2020-12-03 DIAGNOSIS — Z6833 Body mass index (BMI) 33.0-33.9, adult: Secondary | ICD-10-CM | POA: Diagnosis not present

## 2020-12-03 DIAGNOSIS — I10 Essential (primary) hypertension: Secondary | ICD-10-CM | POA: Diagnosis not present

## 2020-12-03 DIAGNOSIS — M48062 Spinal stenosis, lumbar region with neurogenic claudication: Secondary | ICD-10-CM | POA: Diagnosis not present

## 2020-12-10 ENCOUNTER — Other Ambulatory Visit: Payer: Self-pay

## 2020-12-11 ENCOUNTER — Other Ambulatory Visit: Payer: Self-pay | Admitting: Neurosurgery

## 2020-12-16 ENCOUNTER — Ambulatory Visit (INDEPENDENT_AMBULATORY_CARE_PROVIDER_SITE_OTHER): Payer: Medicare PPO

## 2020-12-16 ENCOUNTER — Other Ambulatory Visit: Payer: Self-pay

## 2020-12-16 DIAGNOSIS — Z23 Encounter for immunization: Secondary | ICD-10-CM | POA: Diagnosis not present

## 2021-01-08 ENCOUNTER — Other Ambulatory Visit: Payer: Self-pay

## 2021-01-12 ENCOUNTER — Encounter: Payer: Self-pay | Admitting: Vascular Surgery

## 2021-01-12 ENCOUNTER — Other Ambulatory Visit: Payer: Self-pay

## 2021-01-12 ENCOUNTER — Ambulatory Visit: Payer: Medicare PPO | Admitting: Vascular Surgery

## 2021-01-12 VITALS — BP 166/100 | HR 71 | Temp 98.1°F | Resp 18 | Ht 73.0 in | Wt 245.0 lb

## 2021-01-12 DIAGNOSIS — M48062 Spinal stenosis, lumbar region with neurogenic claudication: Secondary | ICD-10-CM

## 2021-01-12 NOTE — Progress Notes (Signed)
Patient name: [name] MRN: [id number] DOB: 1956-05-21 Sex: male  REASON FOR CONSULT: Evaluate for L5-S1 ALIF  HPI: [name] is a 65 y.o. male, with hx hypertension, hyperlipidemia, A. fib on anticoagulation and chronic lower back pain who presents for preop evaluation of L5-S1 ALIF.  Patient states he has had lower back pain with right leg radiculopathy for the last 6 months and had minimal relief with multiple injections.  He has had multiple previous surgeries including an L4-L5 TLIF on 06/12/2019 and then a PLIF L2-3, L3-4 with cortical screws L2-5 on 03/16/2020.  His only previous abdominal surgery is umbilical hernia repair.  He does work for the [employer] association.  Past Medical History:  Diagnosis Date  . Allergy   . Arthritis   . Cancer ([place])    skin  . Complication of anesthesia    "Woke up with knee surgery"  . Dysrhythmia    Hx of Afib  . Heart murmur   . History of kidney stones    passed 1  . HOH (hard of hearing)    right ear  . Hyperlipidemia   . Hypertension    Essential  . Hypokalemia   . Joint pain   . Spinal stenosis, lumbar region, with neurogenic claudication     Past Surgical History:  Procedure Laterality Date  . BACK SURGERY  2018  . COLONOSCOPY    . HERNIA REPAIR    . JOINT REPLACEMENT Left     x2  . LITHOTRIPSY    . [place] SURGERY  47/14/2020  . Lumbar disectomy  01/18/2019  . TRANSFORAMINAL LUMBAR INTERBODY FUSION (TLIF) WITH PEDICLE SCREW FIXATION 1 LEVEL N/A 06/12/2019   Procedure: LUMBAR FOUR-FIVE TRANSFORAMINAL LUMBAR INTERBODY FUSION (TLIF) WITH PEDICLE SCREW FIXATION;  Surgeon: Kary Kos, MD;  Location: [place];  Service: Neurosurgery;  Laterality: N/A;    Family History  Problem Relation Age of Onset  . Cancer Mother   . Cancer Father   . Cancer Brother   . Hyperlipidemia Brother     SOCIAL HISTORY: Social History   Socioeconomic History  . Marital status: Married    Spouse name: Not on file   . Number of children: Not on file  . Years of education: Not on file  . Highest education level: Not on file  Occupational History  . Not on file  Tobacco Use  . Smoking status: Former Research scientist (life sciences)  . Smokeless tobacco: Former Systems developer    Types: Chew    Quit date: 12/2018  . Tobacco comment: Quit Fall 2020  Vaping Use  . Vaping Use: Never used  Substance and Sexual Activity  . Alcohol use: Yes    Comment: 2 drinks a month  . Drug use: Never  . Sexual activity: Yes  Other Topics Concern  . Not on file  Social History Narrative  . Not on file   Social Determinants of Health   Financial Resource Strain: Low Risk   . Difficulty of Paying Living Expenses: Not hard at all  Food Insecurity: No Food Insecurity  . Worried About Charity fundraiser in the Last Year: Never true  . Ran Out of Food in the Last Year: Never true  Transportation Needs: No Transportation Needs  . Lack of Transportation (Medical): No  . Lack of Transportation (Non-Medical): No  Physical Activity: Inactive  . Days of Exercise per Week: 0 days  . Minutes of Exercise per Session: 0 min  Stress: No  Stress Concern Present  . Feeling of Stress : Not at all  Social Connections: Socially Integrated  . Frequency of Communication with Friends and Family: More than three times a week  . Frequency of Social Gatherings with Friends and Family: More than three times a week  . Attends Religious Services: More than 4 times per year  . Active Member of Clubs or Organizations: Yes  . Attends Archivist Meetings: More than 4 times per year  . Marital Status: Married  Human resources officer Violence: Not At Risk  . Fear of Current or Ex-Partner: No  . Emotionally Abused: No  . Physically Abused: No  . Sexually Abused: No    Allergies  Allergen Reactions  . Meloxicam Palpitations    Other reaction(s): arrythmia    Current Outpatient Medications  Medication Sig Dispense Refill  . celecoxib (CELEBREX) 200 MG capsule  Take 1 capsule (200 mg total) by mouth daily. 30 capsule 5  . diltiazem (CARDIZEM CD) 120 MG 24 hr capsule Take 1 capsule (120 mg total) by mouth in the morning and at bedtime. 180 capsule 1  . fexofenadine (ALLEGRA) 180 MG tablet Take 1 tablet by mouth daily (Patient taking differently: Take 180 mg by mouth every evening.) 90 tablet 0  . fluticasone (FLONASE) 50 MCG/ACT nasal spray Use 1 spray(s) in each nostril once daily (Patient taking differently: Place 1 spray into both nostrils daily as needed for allergies.) 16 g 1  . hydrochlorothiazide (HYDRODIURIL) 12.5 MG tablet Take 1 tablet (12.5 mg total) by mouth daily. 90 tablet 1  . lisinopril (ZESTRIL) 20 MG tablet Take 1 tablet (20 mg total) by mouth every evening. 90 tablet 1  . metoprolol tartrate (LOPRESSOR) 50 MG tablet Take 0.5 tablets (25 mg total) by mouth 2 (two) times daily. 90 tablet 1  . montelukast (SINGULAIR) 10 MG tablet Take 1 tablet (10 mg total) by mouth at bedtime. 90 tablet 1  . Multiple Vitamins-Minerals (MULTIVITAMIN WITH MINERALS) tablet Take 1 tablet by mouth daily.    . Olopatadine HCl 0.2 % SOLN Place 1 drop into both eyes 2 (two) times daily as needed (allergies.).    Marland Kitchen pantoprazole (PROTONIX) 40 MG tablet Take 1 tablet (40 mg total) by mouth daily. 90 tablet 1  . potassium chloride SA (KLOR-CON) 20 MEQ tablet Take 3 tablets (60 mEq total) by mouth daily. (Patient taking differently: Take 20-40 mEq by mouth See admin instructions. 40 meq in the morning, 20 meq in the evening) 270 tablet 1  . tadalafil (CIALIS) 5 MG tablet Take 1-2 tablets (5-10 mg total) by mouth daily as needed for erectile dysfunction. 30 tablet 11  . tamsulosin (FLOMAX) 0.4 MG CAPS capsule Take 1 capsule (0.4 mg total) by mouth daily. 90 capsule 3  . apixaban (ELIQUIS) 5 MG TABS tablet Take 5 mg by mouth 2 (two) times daily. (Patient not taking: Reported on 01/12/2021)    . flecainide (TAMBOCOR) 100 MG tablet Take 100 mg by mouth daily as needed (afib).  (Patient not taking: Reported on 01/12/2021)     No current facility-administered medications for this visit.    REVIEW OF SYSTEMS:  [X]  denotes positive finding, [ ]  denotes negative finding Cardiac  Comments:  Chest pain or chest pressure:    Shortness of breath upon exertion:    Short of breath when lying flat:    Irregular heart rhythm:        Vascular    Pain in calf, thigh, or hip brought  on by ambulation:    Pain in feet at night that wakes you up from your sleep:     Blood clot in your veins:    Leg swelling:         Pulmonary    Oxygen at home:    Productive cough:     Wheezing:         Neurologic    Sudden weakness in arms or legs:     Sudden numbness in arms or legs:     Sudden onset of difficulty speaking or slurred speech:    Temporary loss of vision in one eye:     Problems with dizziness:         Gastrointestinal    Blood in stool:     Vomited blood:         Genitourinary    Burning when urinating:     Blood in urine:        Psychiatric    Major depression:         Hematologic    Bleeding problems:    Problems with blood clotting too easily:        Skin    Rashes or ulcers:        Constitutional    Fever or chills:      PHYSICAL EXAM: Vitals:   01/12/21 1600  BP: (!) 166/100  Pulse: 71  Resp: 18  Temp: 98.1 F (36.7 C)  TempSrc: Temporal  SpO2: 97%  Weight: 245 lb (111.1 kg)  Height: 6\' 1"  (1.854 m)    GENERAL: The patient is a well-nourished male, in no acute distress. The vital signs are documented above. CARDIAC: There is a regular rate and rhythm.  VASCULAR:  Palpable femoral pulses bilaterally Palpable DP PT pulses bilateral PULMONARY: No respiratory distress. ABDOMEN: Soft and non-tender. MUSCULOSKELETAL: There are no major deformities or cyanosis. NEUROLOGIC: No focal weakness or paresthesias are detected. SKIN: There are no ulcers or rashes noted. PSYCHIATRIC: The patient has a normal affect.  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  Posterior instrumentation L2-L5.  Assessment/Plan:  65 year old malepresents for preop evaluation of L5-S1 ALIF for chronic lower back pain with right leg radiculopathy.  I reviewed his MRI with him and his wife and I think he would be a good candidate for anterior approach.  We discussed transverse incision over the left rectus muscle and mobilization of the left rectus and entering the retroperitoneal space to mobilize the peritoneum and left ureter to midline including moving the iliac arteries and veins to expose the L5-S1 disc from the front.  We discussed injury to the above structures. Look forward to assisting Dr. Saintclair Halsted.   [name], MD [facility]: [phone]

## 2021-01-13 NOTE — Pre-Procedure Instructions (Addendum)
[name]  01/13/2021    Your procedure is scheduled on Mon., January 18, 2021 from 7:30AM-11:45AM  Report to [facility] Entrance "A" at 5:30AM  Call this number if you have problems the morning of surgery:  [phone]   Remember:  Do not eat or drink after midnight on March 27th    Take these medicines the morning of surgery with A SIP OF WATER: Metoprolol tartrate (LOPRESSOR) Pantoprazole (PROTONIX) Tamsulosin (FLOMAX)  If Needed: Fluticasone (FLONASE) Olopatadine HCl  As of today, STOP taking all Aspirin (unless instructed by your doctor) and Other Aspirin containing products, Vitamins, Fish oils, and Herbal medications. Also stop all NSAIDS i.e. Celecoxib (CELEBREX), Advil, Ibuprofen, Motrin, Aleve, Anaprox, Naproxen, BC, Goody Powders, and all Supplements.   No Smoking of any kind, Tobacco/Vaping, or Alcohol products 24 hours prior to your procedure. If you use a Cpap at night, you may bring all equipment for your overnight stay.    Day of Surgery:  Do not wear jewelry.  Do not wear lotions, powders, colognes, or deodorant.  Do not shave 48 hours prior to surgery.  Men may shave face and neck.  Do not bring valuables to the hospital.  [facility] is not responsible for any belongings or valuables.  Contacts, dentures or bridgework may not be worn into surgery.   For patients admitted to the hospital, discharge time will be determined by your treatment team.  Patients discharged the day of surgery will not be allowed to drive home, and someone age 85 and over needs to stay with them for 24 hours.  Special instructions:   - Preparing For Surgery  Before surgery, you can play an important role. Because skin is not sterile, your skin needs to be as free of germs as possible. You can reduce the number of germs on your skin by washing with CHG (chlorahexidine gluconate) Soap before surgery.  CHG is an antiseptic cleaner which kills germs and  bonds with the skin to continue killing germs even after washing.    Oral Hygiene is also important to reduce your risk of infection.  Remember - BRUSH YOUR TEETH THE MORNING OF SURGERY WITH YOUR REGULAR TOOTHPASTE  Please do not use if you have an allergy to CHG or antibacterial soaps. If your skin becomes reddened/irritated stop using the CHG.  Do not shave (including legs and underarms) for at least 48 hours prior to first CHG shower. It is OK to shave your face.  Please follow these instructions carefully.   1. Shower the NIGHT BEFORE SURGERY and the MORNING OF SURGERY with CHG.   2. If you chose to wash your hair, wash your hair first as usual with your normal shampoo.  3. After you shampoo, rinse your hair and body thoroughly to remove the shampoo.  4. Use CHG as you would any other liquid soap. You can apply CHG directly to the skin and wash gently with a scrungie or a clean washcloth.   5. Apply the CHG Soap to your body ONLY FROM THE NECK DOWN.  Do not use on open wounds or open sores. Avoid contact with your eyes, ears, mouth and genitals (private parts). Wash Face and genitals (private parts)  with your normal soap.  6. Wash thoroughly, paying special attention to the area where your surgery will be performed.  7. Thoroughly rinse your body with warm water from the neck down.  8. DO NOT shower/wash with your normal soap after using  and rinsing off the CHG Soap.  9. Pat yourself dry with a CLEAN TOWEL.  10. Wear CLEAN PAJAMAS to bed the night before surgery, wear comfortable clothes the morning of surgery  11. Place CLEAN SHEETS on your bed the night of your first shower and DO NOT SLEEP WITH PETS.  Reminders: Do not apply any deodorants/lotions.  Please wear clean clothes to the hospital/surgery center.   Remember to brush your teeth WITH YOUR REGULAR TOOTHPASTE.  Please read over the following fact sheets that you were given.

## 2021-01-14 ENCOUNTER — Encounter (HOSPITAL_COMMUNITY): Payer: Self-pay

## 2021-01-14 ENCOUNTER — Other Ambulatory Visit: Payer: Self-pay

## 2021-01-14 ENCOUNTER — Encounter (HOSPITAL_COMMUNITY)
Admission: RE | Admit: 2021-01-14 | Discharge: 2021-01-14 | Disposition: A | Discharge status: Home / Self Care | Payer: Medicare PPO | Source: Ambulatory Visit | Attending: Neurosurgery | Admitting: Neurosurgery

## 2021-01-14 DIAGNOSIS — Z85828 Personal history of other malignant neoplasm of skin: Secondary | ICD-10-CM | POA: Insufficient documentation

## 2021-01-14 DIAGNOSIS — M48061 Spinal stenosis, lumbar region without neurogenic claudication: Secondary | ICD-10-CM | POA: Diagnosis not present

## 2021-01-14 DIAGNOSIS — Z79899 Other long term (current) drug therapy: Secondary | ICD-10-CM | POA: Diagnosis not present

## 2021-01-14 DIAGNOSIS — E785 Hyperlipidemia, unspecified: Secondary | ICD-10-CM | POA: Insufficient documentation

## 2021-01-14 DIAGNOSIS — Z7901 Long term (current) use of anticoagulants: Secondary | ICD-10-CM | POA: Insufficient documentation

## 2021-01-14 DIAGNOSIS — Z981 Arthrodesis status: Secondary | ICD-10-CM | POA: Insufficient documentation

## 2021-01-14 DIAGNOSIS — I10 Essential (primary) hypertension: Secondary | ICD-10-CM | POA: Insufficient documentation

## 2021-01-14 DIAGNOSIS — H918X1 Other specified hearing loss, right ear: Secondary | ICD-10-CM | POA: Diagnosis not present

## 2021-01-14 DIAGNOSIS — Z01812 Encounter for preprocedural laboratory examination: Secondary | ICD-10-CM | POA: Diagnosis not present

## 2021-01-14 DIAGNOSIS — I700 Atherosclerosis of aorta: Secondary | ICD-10-CM | POA: Diagnosis not present

## 2021-01-14 DIAGNOSIS — Z20822 Contact with and (suspected) exposure to covid-19: Secondary | ICD-10-CM | POA: Diagnosis not present

## 2021-01-14 DIAGNOSIS — Z87891 Personal history of nicotine dependence: Secondary | ICD-10-CM | POA: Diagnosis not present

## 2021-01-14 LAB — CBC
HCT: 41.3 % (ref 39.0–52.0)
Hemoglobin: 12.9 g/dL — ABNORMAL LOW (ref 13.0–17.0)
MCH: 23.5 pg — ABNORMAL LOW (ref 26.0–34.0)
MCHC: 31.2 g/dL (ref 30.0–36.0)
MCV: 75.4 fL — ABNORMAL LOW (ref 80.0–100.0)
Platelets: 226 10*3/uL (ref 150–400)
RBC: 5.48 MIL/uL (ref 4.22–5.81)
RDW: 17.6 % — ABNORMAL HIGH (ref 11.5–15.5)
WBC: 6.9 10*3/uL (ref 4.0–10.5)
nRBC: 0 % (ref 0.0–0.2)

## 2021-01-14 LAB — BASIC METABOLIC PANEL
Anion gap: 7 (ref 5–15)
BUN: 8 mg/dL (ref 8–23)
CO2: 31 mmol/L (ref 22–32)
Calcium: 8.9 mg/dL (ref 8.9–10.3)
Chloride: 101 mmol/L (ref 98–111)
Creatinine, Ser: 1.04 mg/dL (ref 0.61–1.24)
GFR, Estimated: >60 mL/min (ref >60)
Glucose, Bld: 99 mg/dL (ref 70–99)
Potassium: 3.6 mmol/L (ref 3.5–5.1)
Sodium: 139 mmol/L (ref 135–145)

## 2021-01-14 LAB — SURGICAL PCR SCREEN
MRSA, PCR: NEGATIVE
Staphylococcus aureus: NEGATIVE

## 2021-01-14 LAB — SARS CORONAVIRUS 2 (TAT 6-24 HRS): SARS Coronavirus 2: NEGATIVE

## 2021-01-14 NOTE — Progress Notes (Signed)
PCP - Dr. Otilio Miu Cardiologist - Dr. Nehemiah Massed  PPM/ICD - n/a Device Orders -  Rep Notified -   Chest x-ray - n/a EKG - 04/07/2020 Stress Test - 03/16/2017 ECHO - patient denies Cardiac Cath - patient denies  Sleep Study - n/a, negative stop bang CPAP -   Fasting Blood Sugar - n/a Checks Blood Sugar _____ times a day  Blood Thinner Instructions: Eliquis last dose 01/06/21 Aspirin Instructions:  ERAS Protcol - npo p mn PRE-SURGERY Ensure or G2-   COVID TEST- at PAT 01/14/21   Anesthesia review: yes, cardiac history, history of stress test - records requested  Patient denies shortness of breath, fever, cough and chest pain at PAT appointment   All instructions explained to the patient, with a verbal understanding of the material. Patient agrees to go over the instructions while at home for a better understanding. Patient also instructed to self quarantine after being tested for COVID-19. The opportunity to ask questions was provided.

## 2021-01-14 NOTE — Anesthesia Preprocedure Evaluation (Addendum)
Anesthesia Evaluation  Patient identified by MRN, date of birth, ID band Patient awake    Reviewed: NPO status , Patient's Chart, lab work & pertinent test results  Airway Mallampati: II  TM Distance: >3 FB Neck ROM: Full    Dental  (+) Teeth Intact   Pulmonary neg pulmonary ROS,    Pulmonary exam normal        Cardiovascular hypertension, Pt. on medications and Pt. on home beta blockers + dysrhythmias Atrial Fibrillation  Rhythm:Irregular Rate:Normal     Neuro/Psych negative neurological ROS  negative psych ROS   GI/Hepatic Neg liver ROS, GERD  Medicated,  Endo/Other  negative endocrine ROS  Renal/GU negative Renal ROS  negative genitourinary   Musculoskeletal  (+) Arthritis , Osteoarthritis,  stenosis   Abdominal (+)  Abdomen: soft. Bowel sounds: normal.  Peds  Hematology negative hematology ROS (+)   Anesthesia Other Findings   Reproductive/Obstetrics                            Anesthesia Physical Anesthesia Plan  ASA: III  Anesthesia Plan: General   Post-op Pain Management:    Induction: Intravenous  PONV Risk Score and Plan: 2 and Ondansetron, Dexamethasone, Midazolam and Treatment may vary due to age or medical condition  Airway Management Planned: Mask and Oral ETT  Additional Equipment: Arterial line  Intra-op Plan:   Post-operative Plan: Extubation in OR  Informed Consent: I have reviewed the patients History and Physical, chart, labs and discussed the procedure including the risks, benefits and alternatives for the proposed anesthesia with the patient or authorized representative who has indicated his/her understanding and acceptance.     Dental advisory given  Plan Discussed with: CRNA  Anesthesia Plan Comments: (PAT note written 01/14/2021 by Myra Gianotti, PA-C. "Last cardiology visit with Dr. Guy Franco was on 09/29/20. For PAF and HTN follow-up.  On  metoprolol and diltiazem with flecainide as needed. BP 150/80. He wrote, "Proceed to surgery and/or invasive procedure without restriction. The patient is at the lowest risk possible for cardiovascular complication as stated above. Patient may discontinue anticoagulation 3 days prior to surgical intervention and or procedure for reduced bleeding risk. The patient shall restart at a safe period after procedure when bleeding risk is reduced..." Pharmacy documented last Eliquis 01/09/21."  Lab Results      Component                Value               Date                      WBC                      6.9                 01/14/2021                HGB                      12.9 (L)            01/14/2021                HCT                      41.3  01/14/2021                MCV                      75.4 (L)            01/14/2021                PLT                      226                 01/14/2021           Lab Results      Component                Value               Date                      NA                       139                 01/14/2021                K                        3.6                 01/14/2021                CO2                      31                  01/14/2021                GLUCOSE                  99                  01/14/2021                BUN                      8                   01/14/2021                CREATININE               1.04                01/14/2021                CALCIUM                  8.9                 01/14/2021                GFRNONAA                 >60                 01/14/2021  GFRAA                    103                 04/06/2020          )       Anesthesia Quick Evaluation

## 2021-01-14 NOTE — Progress Notes (Addendum)
Anesthesia Chart Review:  Case: [id number] Date/Time: 01/18/21 0715   Procedures:      ALIF - L5-S1 (N/A )     ABDOMINAL EXPOSURE (N/A )   Anesthesia type: General   Pre-op diagnosis: Stenosis   Location: [place] OR ROOM 20 / [facility] OR   Surgeons: Kary Kos, MD; [name], MD      DISCUSSION: Patient is a 65 year old male scheduled for the above procedure. He is s/p L4-5 transforaminal lumbar interbody fusion on 06/12/2019 and L2-4 PLIF 03/16/20.  History includes former smoker, HTN, HLD, PAF (onset > 5 years ago), hard of hearing (right), murmur ("no murmur" 09/29/20 cardiology note), skin cancer, back surgery (lumbar discectomy 2020, L4-5 fusion 06/12/19; L2-4 PLIF 03/16/20). Said he "woke up" with knee surgery.   Last cardiology visit with Dr. Guy Franco was on 09/29/20. For PAF and HTN follow-up.  On metoprolol and diltiazem with flecainide as needed. BP 150/80. He wrote, "Proceed to surgery and/or invasive procedure without restriction. The patient is at the lowest risk possible for cardiovascular complication as stated above. Patient may discontinue anticoagulation 3 days prior to surgical intervention and or procedure for reduced bleeding risk. The patient shall restart at a safe period after procedure when bleeding risk is reduced..." Pharmacy documented last Eliquis 01/09/21.   01/14/21 presurgical COVID-19 test negative.  He will get vitals on arrival to reevaluate BP.  Anesthesia team to evaluate on the day of surgery.    VS: BP (!) 161/101   Pulse 64   Temp 36.7 C (Oral)   Resp 18   Ht 6\' 1"  (1.854 m)   Wt 112.9 kg   SpO2 99%   BMI 32.84 kg/m  BP Readings from Last 3 Encounters:  01/14/21 (!) 161/101  01/12/21 (!) 166/100  11/27/20 130/84    PROVIDERS: Juline Patch, MD is PCP  Serafina Royals, MD is cardiologist [facility], see [place])   LABS: Labs reviewed: Acceptable for surgery. (all labs ordered are listed, but only abnormal results are  displayed)  Labs Reviewed  CBC - Abnormal; Notable for the following components:      Result Value   Hemoglobin 12.9 (*)    MCV 75.4 (*)    MCH 23.5 (*)    RDW 17.6 (*)    All other components within normal limits  SARS CORONAVIRUS 2 (TAT 6-24 HRS)  SURGICAL PCR SCREEN  BASIC METABOLIC PANEL  TYPE AND SCREEN     IMAGES: CT L-spine 07/07/20: IMPRESSION: 1. Extension of L4-5 fusion to include the L2 and L3 levels. No hardware adverse features. 2. Unchanged moderate right and mild left L5-S1 foraminal stenosis. - Aortic Atherosclerosis (ICD10-I70.0).   EKG: EKG 04/07/20 ([facility]): (Copy on chart, [facility] 01/15/21.) This result has an attachment that is not available.  Sinus bradycardia  Otherwise normal ECG  When compared with ECG of 03-Dec-2019 14:17,  No significant change was found  I reviewed and concur with this report. 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 Nehemiah Massed, "Regular Stress was performed showing Normal test." (Copy on chart, received from [facility] 01/15/21.)  24 hour Holter Monitor 02/06/17 (  [facility]): Sinus - bradycardia/rhythm/arrhythmia. Rare SVE's.  Some paired.  Isolated 4 beat SVT run. Isolated PVCs.  Some baseline artifact noted.  No diary submitted.  There is no recent echocardiogram.    Past Medical History:  Diagnosis Date  . Allergy   . Arthritis   . Cancer ([place])    skin  . Complication of anesthesia    "Woke up with knee surgery"  . Dysrhythmia    Hx of Afib  . Heart murmur   . History of kidney stones    passed 1  . HOH (hard of  hearing)    right ear  . Hyperlipidemia   . Hypertension    Essential  . Hypokalemia   . Joint pain   . Spinal stenosis, lumbar region, with neurogenic claudication     Past Surgical History:  Procedure Laterality Date  . BACK SURGERY  2018  . COLONOSCOPY    . HERNIA REPAIR    . JOINT REPLACEMENT Left     x2 - knee  . LITHOTRIPSY    . [place] SURGERY  47/14/2020  . Lumbar disectomy  01/18/2019  . TRANSFORAMINAL LUMBAR INTERBODY FUSION (TLIF) WITH PEDICLE SCREW FIXATION 1 LEVEL N/A 06/12/2019   Procedure: LUMBAR FOUR-FIVE TRANSFORAMINAL LUMBAR INTERBODY FUSION (TLIF) WITH PEDICLE SCREW FIXATION;  Surgeon: Kary Kos, MD;  Location: [place];  Service: Neurosurgery;  Laterality: N/A;    MEDICATIONS: . apixaban (ELIQUIS) 5 MG TABS tablet  . celecoxib (CELEBREX) 200 MG capsule  . diltiazem (CARDIZEM CD) 120 MG 24 hr capsule  . fexofenadine (ALLEGRA) 180 MG tablet  . flecainide (TAMBOCOR) 100 MG tablet  . fluticasone (FLONASE) 50 MCG/ACT nasal spray  . hydrochlorothiazide (HYDRODIURIL) 12.5 MG tablet  . lisinopril (ZESTRIL) 20 MG tablet  . metoprolol tartrate (LOPRESSOR) 50 MG tablet  . montelukast (SINGULAIR) 10 MG tablet  . Multiple Vitamins-Minerals (MULTIVITAMIN WITH MINERALS) tablet  . Olopatadine HCl 0.2 % SOLN  . pantoprazole (PROTONIX) 40 MG tablet  . potassium chloride SA (KLOR-CON) 20 MEQ tablet  . tadalafil (CIALIS) 5 MG tablet  . tamsulosin (FLOMAX) 0.4 MG CAPS capsule   No current facility-administered medications for this encounter.    Myra Gianotti, PA-C Surgical Short Stay/Anesthesiology [facility] Phone [phone] [facility] Phone [phone] 01/14/2021 5:43 PM

## 2021-01-18 ENCOUNTER — Encounter (HOSPITAL_COMMUNITY): Payer: Self-pay | Admitting: Neurosurgery

## 2021-01-18 ENCOUNTER — Inpatient Hospital Stay (HOSPITAL_COMMUNITY): Payer: Medicare PPO | Admitting: Certified Registered Nurse Anesthetist

## 2021-01-18 ENCOUNTER — Inpatient Hospital Stay (HOSPITAL_COMMUNITY): Payer: Medicare PPO

## 2021-01-18 ENCOUNTER — Inpatient Hospital Stay (HOSPITAL_COMMUNITY): Payer: Medicare PPO | Admitting: Physician Assistant

## 2021-01-18 ENCOUNTER — Other Ambulatory Visit: Payer: Self-pay

## 2021-01-18 ENCOUNTER — Encounter (HOSPITAL_COMMUNITY)
Admission: RE | Disposition: A | Discharge status: Home / Self Care | Payer: Self-pay | Source: Home / Self Care | Attending: Neurosurgery

## 2021-01-18 ENCOUNTER — Inpatient Hospital Stay (HOSPITAL_COMMUNITY)
Admission: RE | Admit: 2021-01-18 | Discharge: 2021-01-20 | DRG: 460 | Disposition: A | Discharge status: Home / Self Care | Payer: Medicare PPO | Attending: Neurosurgery | Admitting: Neurosurgery

## 2021-01-18 DIAGNOSIS — Z87442 Personal history of urinary calculi: Secondary | ICD-10-CM

## 2021-01-18 DIAGNOSIS — Z79899 Other long term (current) drug therapy: Secondary | ICD-10-CM

## 2021-01-18 DIAGNOSIS — H9191 Unspecified hearing loss, right ear: Secondary | ICD-10-CM | POA: Diagnosis present

## 2021-01-18 DIAGNOSIS — M4857XA Collapsed vertebra, not elsewhere classified, lumbosacral region, initial encounter for fracture: Secondary | ICD-10-CM | POA: Diagnosis not present

## 2021-01-18 DIAGNOSIS — M5136 Other intervertebral disc degeneration, lumbar region: Secondary | ICD-10-CM | POA: Diagnosis not present

## 2021-01-18 DIAGNOSIS — I10 Essential (primary) hypertension: Secondary | ICD-10-CM | POA: Diagnosis not present

## 2021-01-18 DIAGNOSIS — I4891 Unspecified atrial fibrillation: Secondary | ICD-10-CM | POA: Diagnosis not present

## 2021-01-18 DIAGNOSIS — G8929 Other chronic pain: Secondary | ICD-10-CM | POA: Diagnosis not present

## 2021-01-18 DIAGNOSIS — Z419 Encounter for procedure for purposes other than remedying health state, unspecified: Secondary | ICD-10-CM

## 2021-01-18 DIAGNOSIS — M545 Low back pain, unspecified: Secondary | ICD-10-CM | POA: Diagnosis present

## 2021-01-18 DIAGNOSIS — E876 Hypokalemia: Secondary | ICD-10-CM | POA: Diagnosis not present

## 2021-01-18 DIAGNOSIS — Z981 Arthrodesis status: Secondary | ICD-10-CM | POA: Diagnosis not present

## 2021-01-18 DIAGNOSIS — Z87891 Personal history of nicotine dependence: Secondary | ICD-10-CM

## 2021-01-18 DIAGNOSIS — M4326 Fusion of spine, lumbar region: Secondary | ICD-10-CM | POA: Diagnosis not present

## 2021-01-18 DIAGNOSIS — M5117 Intervertebral disc disorders with radiculopathy, lumbosacral region: Secondary | ICD-10-CM | POA: Diagnosis present

## 2021-01-18 DIAGNOSIS — Z886 Allergy status to analgesic agent status: Secondary | ICD-10-CM

## 2021-01-18 DIAGNOSIS — M4807 Spinal stenosis, lumbosacral region: Secondary | ICD-10-CM | POA: Diagnosis present

## 2021-01-18 DIAGNOSIS — E785 Hyperlipidemia, unspecified: Secondary | ICD-10-CM | POA: Diagnosis present

## 2021-01-18 DIAGNOSIS — Z83438 Family history of other disorder of lipoprotein metabolism and other lipidemia: Secondary | ICD-10-CM | POA: Diagnosis not present

## 2021-01-18 DIAGNOSIS — M532X7 Spinal instabilities, lumbosacral region: Secondary | ICD-10-CM | POA: Diagnosis present

## 2021-01-18 DIAGNOSIS — Z85828 Personal history of other malignant neoplasm of skin: Secondary | ICD-10-CM | POA: Diagnosis not present

## 2021-01-18 DIAGNOSIS — Z791 Long term (current) use of non-steroidal anti-inflammatories (NSAID): Secondary | ICD-10-CM | POA: Diagnosis not present

## 2021-01-18 DIAGNOSIS — M533 Sacrococcygeal disorders, not elsewhere classified: Secondary | ICD-10-CM | POA: Diagnosis not present

## 2021-01-18 DIAGNOSIS — M5137 Other intervertebral disc degeneration, lumbosacral region: Secondary | ICD-10-CM | POA: Diagnosis not present

## 2021-01-18 HISTORY — PX: ABDOMINAL EXPOSURE: SHX5708

## 2021-01-18 HISTORY — PX: ANTERIOR LUMBAR FUSION: SHX1170

## 2021-01-18 LAB — PREPARE RBC (CROSSMATCH)

## 2021-01-18 SURGERY — ANTERIOR LUMBAR FUSION 1 LEVEL
Anesthesia: General | Site: Back

## 2021-01-18 MED ORDER — THROMBIN 20000 UNITS EX SOLR
CUTANEOUS | Status: AC
  Filled 2021-01-18: qty 20000

## 2021-01-18 MED ORDER — HYDROCHLOROTHIAZIDE 25 MG PO TABS
12.5000 mg | ORAL_TABLET | Freq: Every day | ORAL | Status: DC
  Administered 2021-01-18 – 2021-01-20 (×3): 12.5 mg via ORAL
  Filled 2021-01-18 (×3): qty 1

## 2021-01-18 MED ORDER — CELECOXIB 200 MG PO CAPS
200.0000 mg | ORAL_CAPSULE | Freq: Every day | ORAL | Status: DC
  Administered 2021-01-18 – 2021-01-20 (×3): 200 mg via ORAL
  Filled 2021-01-18 (×3): qty 1

## 2021-01-18 MED ORDER — OXYCODONE HCL 5 MG PO TABS
10.0000 mg | ORAL_TABLET | ORAL | Status: DC | PRN
  Administered 2021-01-18 – 2021-01-20 (×13): 10 mg via ORAL
  Filled 2021-01-18 (×13): qty 2

## 2021-01-18 MED ORDER — METOPROLOL TARTRATE 25 MG PO TABS
25.0000 mg | ORAL_TABLET | Freq: Two times a day (BID) | ORAL | Status: DC
  Administered 2021-01-18 – 2021-01-20 (×4): 25 mg via ORAL
  Filled 2021-01-18 (×4): qty 1

## 2021-01-18 MED ORDER — ALUM & MAG HYDROXIDE-SIMETH 200-200-20 MG/5ML PO SUSP: 30.0000 mL | Freq: Four times a day (QID) | ORAL | Status: DC | PRN

## 2021-01-18 MED ORDER — OLOPATADINE HCL 0.1 % OP SOLN
1.0000 [drp] | Freq: Two times a day (BID) | OPHTHALMIC | Status: DC
  Administered 2021-01-18 – 2021-01-20 (×3): 1 [drp] via OPHTHALMIC
  Filled 2021-01-18: qty 5

## 2021-01-18 MED ORDER — ACETAMINOPHEN 325 MG PO TABS
650.0000 mg | ORAL_TABLET | ORAL | Status: DC | PRN
  Administered 2021-01-18 – 2021-01-20 (×4): 650 mg via ORAL
  Filled 2021-01-18 (×4): qty 2

## 2021-01-18 MED ORDER — FLECAINIDE ACETATE 100 MG PO TABS
100.0000 mg | ORAL_TABLET | Freq: Every day | ORAL | Status: DC | PRN
Start: 2021-01-18 — End: 2021-01-21
  Filled 2021-01-18: qty 1

## 2021-01-18 MED ORDER — PANTOPRAZOLE SODIUM 40 MG IV SOLR
40.0000 mg | Freq: Every day | INTRAVENOUS | Status: DC
  Administered 2021-01-18: 40 mg via INTRAVENOUS
  Filled 2021-01-18: qty 40

## 2021-01-18 MED ORDER — PROPOFOL 10 MG/ML IV BOLUS
INTRAVENOUS | Status: AC
  Filled 2021-01-18: qty 20

## 2021-01-18 MED ORDER — CHLORHEXIDINE GLUCONATE CLOTH 2 % EX PADS: 6.0000 | MEDICATED_PAD | Freq: Once | CUTANEOUS | Status: DC

## 2021-01-18 MED ORDER — ONDANSETRON HCL 4 MG PO TABS: 4.0000 mg | ORAL_TABLET | Freq: Four times a day (QID) | ORAL | Status: DC | PRN

## 2021-01-18 MED ORDER — MONTELUKAST SODIUM 10 MG PO TABS
10.0000 mg | ORAL_TABLET | Freq: Every day | ORAL | Status: DC
Start: 2021-01-18 — End: 2021-01-21
  Administered 2021-01-18 – 2021-01-19 (×2): 10 mg via ORAL
  Filled 2021-01-18 (×2): qty 1

## 2021-01-18 MED ORDER — CHLORPROMAZINE HCL 10 MG PO TABS
10.0000 mg | ORAL_TABLET | Freq: Four times a day (QID) | ORAL | Status: DC | PRN
  Administered 2021-01-18 – 2021-01-19 (×3): 10 mg via ORAL
  Filled 2021-01-18 (×4): qty 1

## 2021-01-18 MED ORDER — 0.9 % SODIUM CHLORIDE (POUR BTL) OPTIME
TOPICAL | Status: DC | PRN
  Administered 2021-01-18: 1000 mL

## 2021-01-18 MED ORDER — FENTANYL CITRATE (PF) 250 MCG/5ML IJ SOLN
INTRAMUSCULAR | Status: DC | PRN
  Administered 2021-01-18: 50 ug via INTRAVENOUS
  Administered 2021-01-18: 100 ug via INTRAVENOUS
  Administered 2021-01-18 (×2): 25 ug via INTRAVENOUS

## 2021-01-18 MED ORDER — EPHEDRINE 5 MG/ML INJ
INTRAVENOUS | Status: AC
  Filled 2021-01-18: qty 10

## 2021-01-18 MED ORDER — PHENYLEPHRINE HCL-NACL 10-0.9 MG/250ML-% IV SOLN
INTRAVENOUS | Status: DC | PRN
  Administered 2021-01-18: 30 ug/min via INTRAVENOUS

## 2021-01-18 MED ORDER — CEFAZOLIN SODIUM-DEXTROSE 2-4 GM/100ML-% IV SOLN
2.0000 g | Freq: Three times a day (TID) | INTRAVENOUS | Status: DC
  Administered 2021-01-18 – 2021-01-19 (×2): 2 g via INTRAVENOUS
  Filled 2021-01-18 (×2): qty 100

## 2021-01-18 MED ORDER — SODIUM CHLORIDE 0.9% IV SOLUTION: Freq: Once | INTRAVENOUS | Status: DC

## 2021-01-18 MED ORDER — ACETAMINOPHEN 10 MG/ML IV SOLN
INTRAVENOUS | Status: DC | PRN
  Administered 2021-01-18: 1000 mg via INTRAVENOUS

## 2021-01-18 MED ORDER — THROMBIN 5000 UNITS EX SOLR
CUTANEOUS | Status: AC
  Filled 2021-01-18: qty 5000

## 2021-01-18 MED ORDER — POTASSIUM CHLORIDE CRYS ER 20 MEQ PO TBCR
40.0000 meq | EXTENDED_RELEASE_TABLET | Freq: Every day | ORAL | Status: DC
  Administered 2021-01-19 – 2021-01-20 (×2): 40 meq via ORAL
  Filled 2021-01-18 (×2): qty 2

## 2021-01-18 MED ORDER — ONDANSETRON HCL 4 MG/2ML IJ SOLN: 4.0000 mg | Freq: Four times a day (QID) | INTRAMUSCULAR | Status: DC | PRN

## 2021-01-18 MED ORDER — HYDROMORPHONE HCL 1 MG/ML IJ SOLN
0.5000 mg | INTRAMUSCULAR | Status: DC | PRN
Start: 2021-01-18 — End: 2021-01-21

## 2021-01-18 MED ORDER — SUGAMMADEX SODIUM 200 MG/2ML IV SOLN
INTRAVENOUS | Status: DC | PRN
  Administered 2021-01-18: 100 mg via INTRAVENOUS
  Administered 2021-01-18: 200 mg via INTRAVENOUS

## 2021-01-18 MED ORDER — ALBUMIN HUMAN 5 % IV SOLN: INTRAVENOUS | Status: DC | PRN

## 2021-01-18 MED ORDER — SUFENTANIL CITRATE 250 MCG/5ML IV SOLN
0.2500 ug/kg/h | INTRAVENOUS | Status: AC
  Administered 2021-01-18: .2 ug/kg/h via INTRAVENOUS
  Filled 2021-01-18: qty 5

## 2021-01-18 MED ORDER — PHENYLEPHRINE 40 MCG/ML (10ML) SYRINGE FOR IV PUSH (FOR BLOOD PRESSURE SUPPORT)
PREFILLED_SYRINGE | INTRAVENOUS | Status: AC
  Filled 2021-01-18: qty 10

## 2021-01-18 MED ORDER — PROMETHAZINE HCL 25 MG/ML IJ SOLN: 6.2500 mg | INTRAMUSCULAR | Status: DC | PRN

## 2021-01-18 MED ORDER — CYCLOBENZAPRINE HCL 10 MG PO TABS
10.0000 mg | ORAL_TABLET | Freq: Three times a day (TID) | ORAL | Status: DC | PRN
  Administered 2021-01-18 – 2021-01-19 (×2): 10 mg via ORAL
  Filled 2021-01-18: qty 1

## 2021-01-18 MED ORDER — PHENOL 1.4 % MT LIQD: 1.0000 | OROMUCOSAL | Status: DC | PRN

## 2021-01-18 MED ORDER — BUPIVACAINE LIPOSOME 1.3 % IJ SUSP
INTRAMUSCULAR | Status: AC
  Filled 2021-01-18: qty 20

## 2021-01-18 MED ORDER — DEXAMETHASONE SODIUM PHOSPHATE 10 MG/ML IJ SOLN
10.0000 mg | Freq: Once | INTRAMUSCULAR | Status: AC
  Administered 2021-01-18: 10 mg via INTRAVENOUS
  Filled 2021-01-18: qty 1

## 2021-01-18 MED ORDER — ONDANSETRON HCL 4 MG/2ML IJ SOLN
INTRAMUSCULAR | Status: AC
  Filled 2021-01-18: qty 2

## 2021-01-18 MED ORDER — OXYCODONE HCL 5 MG PO TABS
ORAL_TABLET | ORAL | Status: AC
  Filled 2021-01-18: qty 1

## 2021-01-18 MED ORDER — ADULT MULTIVITAMIN W/MINERALS CH
1.0000 | ORAL_TABLET | Freq: Every day | ORAL | Status: DC
  Administered 2021-01-19 – 2021-01-20 (×2): 1 via ORAL
  Filled 2021-01-18 (×3): qty 1

## 2021-01-18 MED ORDER — LISINOPRIL 20 MG PO TABS
20.0000 mg | ORAL_TABLET | Freq: Every evening | ORAL | Status: DC
  Administered 2021-01-18 – 2021-01-19 (×2): 20 mg via ORAL
  Filled 2021-01-18 (×2): qty 1

## 2021-01-18 MED ORDER — ROCURONIUM BROMIDE 10 MG/ML (PF) SYRINGE
PREFILLED_SYRINGE | INTRAVENOUS | Status: AC
  Filled 2021-01-18: qty 10

## 2021-01-18 MED ORDER — PROPOFOL 10 MG/ML IV BOLUS
INTRAVENOUS | Status: DC | PRN
  Administered 2021-01-18: 180 mg via INTRAVENOUS
  Administered 2021-01-18: 20 mg via INTRAVENOUS

## 2021-01-18 MED ORDER — THROMBIN 20000 UNITS EX SOLR
CUTANEOUS | Status: DC | PRN
  Administered 2021-01-18: 20 mL via TOPICAL

## 2021-01-18 MED ORDER — LIDOCAINE 2% (20 MG/ML) 5 ML SYRINGE
INTRAMUSCULAR | Status: AC
  Filled 2021-01-18: qty 5

## 2021-01-18 MED ORDER — MIDAZOLAM HCL 2 MG/2ML IJ SOLN
INTRAMUSCULAR | Status: AC
  Filled 2021-01-18: qty 2

## 2021-01-18 MED ORDER — CEFAZOLIN SODIUM 1 G IJ SOLR
INTRAMUSCULAR | Status: AC
  Filled 2021-01-18: qty 20

## 2021-01-18 MED ORDER — ACETAMINOPHEN 650 MG RE SUPP: 650.0000 mg | RECTAL | Status: DC | PRN

## 2021-01-18 MED ORDER — FLUTICASONE PROPIONATE 50 MCG/ACT NA SUSP: 1.0000 | Freq: Every day | NASAL | Status: DC | PRN

## 2021-01-18 MED ORDER — DILTIAZEM HCL ER COATED BEADS 120 MG PO CP24
120.0000 mg | ORAL_CAPSULE | Freq: Every day | ORAL | Status: DC
  Administered 2021-01-18 – 2021-01-20 (×3): 120 mg via ORAL
  Filled 2021-01-18 (×3): qty 1

## 2021-01-18 MED ORDER — LACTATED RINGERS IV SOLN: INTRAVENOUS | Status: DC | PRN

## 2021-01-18 MED ORDER — CEFAZOLIN SODIUM-DEXTROSE 2-4 GM/100ML-% IV SOLN
2.0000 g | INTRAVENOUS | Status: AC
  Administered 2021-01-18 (×2): 2 g via INTRAVENOUS
  Filled 2021-01-18: qty 100

## 2021-01-18 MED ORDER — OXYCODONE HCL 5 MG/5ML PO SOLN: 5.0000 mg | Freq: Once | ORAL | Status: AC | PRN

## 2021-01-18 MED ORDER — FENTANYL CITRATE (PF) 100 MCG/2ML IJ SOLN
25.0000 ug | INTRAMUSCULAR | Status: DC | PRN
Start: 2021-01-18 — End: 2021-01-18
  Administered 2021-01-18: 50 ug via INTRAVENOUS
  Administered 2021-01-18 (×2): 25 ug via INTRAVENOUS

## 2021-01-18 MED ORDER — PROPOFOL 10 MG/ML IV BOLUS
INTRAVENOUS | Status: AC
  Filled 2021-01-18: qty 40

## 2021-01-18 MED ORDER — ROCURONIUM BROMIDE 100 MG/10ML IV SOLN
INTRAVENOUS | Status: DC | PRN
  Administered 2021-01-18: 10 mg via INTRAVENOUS
  Administered 2021-01-18 (×2): 20 mg via INTRAVENOUS
  Administered 2021-01-18: 60 mg via INTRAVENOUS
  Administered 2021-01-18: 20 mg via INTRAVENOUS
  Administered 2021-01-18: 10 mg via INTRAVENOUS

## 2021-01-18 MED ORDER — ACETAMINOPHEN 10 MG/ML IV SOLN: 1000.0000 mg | Freq: Once | INTRAVENOUS | Status: DC | PRN

## 2021-01-18 MED ORDER — ACETAMINOPHEN 10 MG/ML IV SOLN
INTRAVENOUS | Status: AC
  Filled 2021-01-18: qty 100

## 2021-01-18 MED ORDER — PANTOPRAZOLE SODIUM 40 MG PO TBEC: 40.0000 mg | DELAYED_RELEASE_TABLET | Freq: Every day | ORAL | Status: DC

## 2021-01-18 MED ORDER — EPHEDRINE SULFATE-NACL 50-0.9 MG/10ML-% IV SOSY
PREFILLED_SYRINGE | INTRAVENOUS | Status: DC | PRN
  Administered 2021-01-18: 10 mg via INTRAVENOUS
  Administered 2021-01-18: 5 mg via INTRAVENOUS

## 2021-01-18 MED ORDER — ORAL CARE MOUTH RINSE: 15.0000 mL | Freq: Once | OROMUCOSAL | Status: AC

## 2021-01-18 MED ORDER — CHLORHEXIDINE GLUCONATE 0.12 % MT SOLN
15.0000 mL | Freq: Once | OROMUCOSAL | Status: AC
  Administered 2021-01-18: 15 mL via OROMUCOSAL
  Filled 2021-01-18: qty 15

## 2021-01-18 MED ORDER — FENTANYL CITRATE (PF) 100 MCG/2ML IJ SOLN
INTRAMUSCULAR | Status: AC
  Filled 2021-01-18: qty 2

## 2021-01-18 MED ORDER — FENTANYL CITRATE (PF) 250 MCG/5ML IJ SOLN
INTRAMUSCULAR | Status: AC
  Filled 2021-01-18: qty 5

## 2021-01-18 MED ORDER — LIDOCAINE 2% (20 MG/ML) 5 ML SYRINGE
INTRAMUSCULAR | Status: DC | PRN
  Administered 2021-01-18: 100 mg via INTRAVENOUS

## 2021-01-18 MED ORDER — MIDAZOLAM HCL 5 MG/5ML IJ SOLN
INTRAMUSCULAR | Status: DC | PRN
  Administered 2021-01-18: 2 mg via INTRAVENOUS

## 2021-01-18 MED ORDER — CYCLOBENZAPRINE HCL 10 MG PO TABS
ORAL_TABLET | ORAL | Status: AC
  Filled 2021-01-18: qty 1

## 2021-01-18 MED ORDER — SODIUM CHLORIDE 0.9% FLUSH: 3.0000 mL | INTRAVENOUS | Status: DC | PRN

## 2021-01-18 MED ORDER — SUCCINYLCHOLINE CHLORIDE 200 MG/10ML IV SOSY
PREFILLED_SYRINGE | INTRAVENOUS | Status: AC
  Filled 2021-01-18: qty 10

## 2021-01-18 MED ORDER — TADALAFIL 5 MG PO TABS: 5.0000 mg | ORAL_TABLET | Freq: Every day | ORAL | Status: DC | PRN

## 2021-01-18 MED ORDER — CHLORHEXIDINE GLUCONATE 4 % EX LIQD: 60.0000 mL | Freq: Once | CUTANEOUS | Status: DC

## 2021-01-18 MED ORDER — LIDOCAINE-EPINEPHRINE 1 %-1:100000 IJ SOLN
INTRAMUSCULAR | Status: AC
  Filled 2021-01-18: qty 1

## 2021-01-18 MED ORDER — SODIUM CHLORIDE 0.9% FLUSH
3.0000 mL | Freq: Two times a day (BID) | INTRAVENOUS | Status: DC
  Administered 2021-01-18 (×2): 3 mL via INTRAVENOUS

## 2021-01-18 MED ORDER — SODIUM CHLORIDE 0.9 % IV SOLN
250.0000 mL | INTRAVENOUS | Status: DC
  Administered 2021-01-18: 250 mL via INTRAVENOUS

## 2021-01-18 MED ORDER — BUPIVACAINE LIPOSOME 1.3 % IJ SUSP
INTRAMUSCULAR | Status: DC | PRN
Start: 2021-01-18 — End: 2021-01-18
  Administered 2021-01-18: 20 mL

## 2021-01-18 MED ORDER — MENTHOL 3 MG MT LOZG: 1.0000 | LOZENGE | OROMUCOSAL | Status: DC | PRN

## 2021-01-18 MED ORDER — LACTATED RINGERS IV SOLN: INTRAVENOUS | Status: DC

## 2021-01-18 MED ORDER — TAMSULOSIN HCL 0.4 MG PO CAPS
0.4000 mg | ORAL_CAPSULE | Freq: Every day | ORAL | Status: DC
  Administered 2021-01-19 – 2021-01-20 (×2): 0.4 mg via ORAL
  Filled 2021-01-18 (×2): qty 1

## 2021-01-18 MED ORDER — LIDOCAINE-EPINEPHRINE 1 %-1:100000 IJ SOLN
INTRAMUSCULAR | Status: DC | PRN
  Administered 2021-01-18: 10 mL

## 2021-01-18 MED ORDER — LORATADINE 10 MG PO TABS
10.0000 mg | ORAL_TABLET | Freq: Every day | ORAL | Status: DC
  Administered 2021-01-18 – 2021-01-20 (×3): 10 mg via ORAL
  Filled 2021-01-18 (×3): qty 1

## 2021-01-18 MED ORDER — POTASSIUM CHLORIDE CRYS ER 20 MEQ PO TBCR: 20.0000 meq | EXTENDED_RELEASE_TABLET | Freq: Every day | ORAL | Status: DC

## 2021-01-18 MED ORDER — OXYCODONE HCL 5 MG PO TABS
5.0000 mg | ORAL_TABLET | Freq: Once | ORAL | Status: AC | PRN
  Administered 2021-01-18: 5 mg via ORAL

## 2021-01-18 MED ORDER — ONDANSETRON HCL 4 MG/2ML IJ SOLN
INTRAMUSCULAR | Status: DC | PRN
  Administered 2021-01-18: 4 mg via INTRAVENOUS

## 2021-01-18 MED ORDER — DEXAMETHASONE SODIUM PHOSPHATE 10 MG/ML IJ SOLN
INTRAMUSCULAR | Status: AC
  Filled 2021-01-18: qty 1

## 2021-01-18 SURGICAL SUPPLY — 109 items
ANCHOR LUMBAR 25 MIS (Anchor) ×9 IMPLANT
APPLIER CLIP 11 MED OPEN (CLIP) ×3
BASKET BONE COLLECTION (BASKET) ×3 IMPLANT
BENZOIN TINCTURE PRP APPL 2/3 (GAUZE/BANDAGES/DRESSINGS) ×3 IMPLANT
BLADE CLIPPER SURG (BLADE) ×3 IMPLANT
BLADE SURG 11 STRL SS (BLADE) ×3 IMPLANT
BONE MATRIX OSTEOCEL PRO MED (Bone Implant) ×3 IMPLANT
BONE VIVIGEN FORMABLE 5.4CC (Bone Implant) ×3 IMPLANT
BUR CUTTER 7.0 ROUND (BURR) ×3 IMPLANT
BUR MATCHSTICK NEURO 3.0 LAGG (BURR) ×3 IMPLANT
CANISTER SUCT 3000ML PPV (MISCELLANEOUS) ×6 IMPLANT
CAP LOCKING (Cap) ×10 IMPLANT
CAP LOCKING 5.5 CREO (Cap) ×20 IMPLANT
CARTRIDGE OIL MAESTRO DRILL (MISCELLANEOUS) ×2 IMPLANT
CLIP APPLIE 11 MED OPEN (CLIP) ×2 IMPLANT
CLIP LIGATING EXTRA MED SLVR (CLIP) ×3 IMPLANT
CLIP LIGATING EXTRA SM BLUE (MISCELLANEOUS) ×3 IMPLANT
CNTNR URN SCR LID CUP LEK RST (MISCELLANEOUS) ×2 IMPLANT
CONT SPEC 4OZ STRL OR WHT (MISCELLANEOUS) ×1
COVER BACK TABLE 60X90IN (DRAPES) ×3 IMPLANT
COVER WAND RF STERILE (DRAPES) IMPLANT
DECANTER SPIKE VIAL GLASS SM (MISCELLANEOUS) ×3 IMPLANT
DERMABOND ADVANCED (GAUZE/BANDAGES/DRESSINGS) ×2
DERMABOND ADVANCED .7 DNX12 (GAUZE/BANDAGES/DRESSINGS) ×4 IMPLANT
DIFFUSER DRILL AIR PNEUMATIC (MISCELLANEOUS) ×3 IMPLANT
DRAPE C-ARM 42X72 X-RAY (DRAPES) ×6 IMPLANT
DRAPE C-ARMOR (DRAPES) ×3 IMPLANT
DRAPE HALF SHEET 40X57 (DRAPES) IMPLANT
DRAPE LAPAROTOMY 100X72X124 (DRAPES) ×6 IMPLANT
DRAPE SURG 17X23 STRL (DRAPES) ×3 IMPLANT
DRSG OPSITE 4X5.5 SM (GAUZE/BANDAGES/DRESSINGS) ×3 IMPLANT
DRSG OPSITE POSTOP 4X10 (GAUZE/BANDAGES/DRESSINGS) ×3 IMPLANT
DRSG OPSITE POSTOP 4X6 (GAUZE/BANDAGES/DRESSINGS) IMPLANT
DRSG OPSITE POSTOP 4X8 (GAUZE/BANDAGES/DRESSINGS) ×3 IMPLANT
DURAPREP 26ML APPLICATOR (WOUND CARE) ×6 IMPLANT
ELECT BLADE 4.0 EZ CLEAN MEGAD (MISCELLANEOUS) ×3
ELECT BLADE 6.5 EXT (BLADE) ×3 IMPLANT
ELECT REM PT RETURN 9FT ADLT (ELECTROSURGICAL) ×6
ELECTRODE BLDE 4.0 EZ CLN MEGD (MISCELLANEOUS) ×2 IMPLANT
ELECTRODE REM PT RTRN 9FT ADLT (ELECTROSURGICAL) ×4 IMPLANT
EVACUATOR 1/8 PVC DRAIN (DRAIN) ×3 IMPLANT
EVACUATOR 3/16  PVC DRAIN (DRAIN)
EVACUATOR 3/16 PVC DRAIN (DRAIN) IMPLANT
GAUZE 4X4 16PLY RFD (DISPOSABLE) IMPLANT
GAUZE SPONGE 4X4 12PLY STRL (GAUZE/BANDAGES/DRESSINGS) IMPLANT
GAUZE SPONGE 4X4 12PLY STRL LF (GAUZE/BANDAGES/DRESSINGS) ×3 IMPLANT
GLOVE BIO SURGEON STRL SZ7 (GLOVE) IMPLANT
GLOVE BIO SURGEON STRL SZ7.5 (GLOVE) ×3 IMPLANT
GLOVE BIO SURGEON STRL SZ8 (GLOVE) ×9 IMPLANT
GLOVE EXAM NITRILE XL STR (GLOVE) IMPLANT
GLOVE INDICATOR 8.5 STRL (GLOVE) ×9 IMPLANT
GLOVE SRG 8 PF TXTR STRL LF DI (GLOVE) ×2 IMPLANT
GLOVE SS BIOGEL STRL SZ 7.5 (GLOVE) ×2 IMPLANT
GLOVE SUPERSENSE BIOGEL SZ 7.5 (GLOVE) ×1
GLOVE SURG UNDER POLY LF SZ7 (GLOVE) IMPLANT
GLOVE SURG UNDER POLY LF SZ8 (GLOVE) ×1
GOWN STRL REUS W/ TWL LRG LVL3 (GOWN DISPOSABLE) IMPLANT
GOWN STRL REUS W/ TWL XL LVL3 (GOWN DISPOSABLE) ×8 IMPLANT
GOWN STRL REUS W/TWL 2XL LVL3 (GOWN DISPOSABLE) ×3 IMPLANT
GOWN STRL REUS W/TWL LRG LVL3 (GOWN DISPOSABLE)
GOWN STRL REUS W/TWL XL LVL3 (GOWN DISPOSABLE) ×4
GRAFT BONE PROTEIOS LRG 5CC (Orthopedic Implant) ×3 IMPLANT
HEMOSTAT POWDER KIT SURGIFOAM (HEMOSTASIS) ×3 IMPLANT
HEMOSTAT SNOW SURGICEL 2X4 (HEMOSTASIS) IMPLANT
IMPL SPINE MCS 6.5 5.5X35 (Neuro Prosthesis/Implant) ×4 IMPLANT
IMPLANT SPINE MCS 6.5 5.5X35 (Neuro Prosthesis/Implant) ×6 IMPLANT
INSERT FOGARTY 61MM (MISCELLANEOUS) IMPLANT
INSERT FOGARTY SM (MISCELLANEOUS) IMPLANT
KIT BASIN OR (CUSTOM PROCEDURE TRAY) ×6 IMPLANT
KIT TURNOVER KIT B (KITS) ×3 IMPLANT
LOOP VESSEL MAXI BLUE (MISCELLANEOUS) IMPLANT
LOOP VESSEL MINI RED (MISCELLANEOUS) IMPLANT
MILL MEDIUM DISP (BLADE) IMPLANT
NEEDLE HYPO 25X1 1.5 SAFETY (NEEDLE) ×3 IMPLANT
NEEDLE SPNL 18GX3.5 QUINCKE PK (NEEDLE) ×3 IMPLANT
NS IRRIG 1000ML POUR BTL (IV SOLUTION) ×3 IMPLANT
OIL CARTRIDGE MAESTRO DRILL (MISCELLANEOUS) ×3
PACK LAMINECTOMY NEURO (CUSTOM PROCEDURE TRAY) ×6 IMPLANT
PAD ARMBOARD 7.5X6 YLW CONV (MISCELLANEOUS) ×9 IMPLANT
ROD CREO 125MM SPINAL (Rod) ×6 IMPLANT
SPACER HEDRON IA 29X39X15 8D (Spacer) ×3 IMPLANT
SPONGE INTESTINAL PEANUT (DISPOSABLE) ×9 IMPLANT
SPONGE LAP 18X18 RF (DISPOSABLE) ×3 IMPLANT
SPONGE LAP 4X18 RFD (DISPOSABLE) IMPLANT
SPONGE SURGIFOAM ABS GEL 100 (HEMOSTASIS) ×3 IMPLANT
STAPLER VISISTAT 35W (STAPLE) IMPLANT
STRIP CLOSURE SKIN 1/2X4 (GAUZE/BANDAGES/DRESSINGS) ×6 IMPLANT
SUT PDS AB 1 CTX 36 (SUTURE) ×3 IMPLANT
SUT PROLENE 4 0 RB 1 (SUTURE)
SUT PROLENE 4-0 RB1 .5 CRCL 36 (SUTURE) IMPLANT
SUT PROLENE 5 0 CC1 (SUTURE) IMPLANT
SUT PROLENE 6 0 C 1 30 (SUTURE) IMPLANT
SUT PROLENE 6 0 CC (SUTURE) IMPLANT
SUT SILK 0 TIES 10X30 (SUTURE) ×3 IMPLANT
SUT SILK 2 0 TIES 10X30 (SUTURE) ×3 IMPLANT
SUT SILK 2 0SH CR/8 30 (SUTURE) IMPLANT
SUT SILK 3 0 SH CR/8 (SUTURE) IMPLANT
SUT SILK 3 0 TIES 17X18 (SUTURE) ×1
SUT SILK 3 0SH CR/8 30 (SUTURE) IMPLANT
SUT SILK 3-0 18XBRD TIE BLK (SUTURE) ×2 IMPLANT
SUT VIC AB 0 CT1 18XCR BRD8 (SUTURE) ×4 IMPLANT
SUT VIC AB 0 CT1 8-18 (SUTURE) ×2
SUT VIC AB 2-0 CT1 18 (SUTURE) ×6 IMPLANT
SUT VIC AB 4-0 PS2 27 (SUTURE) ×6 IMPLANT
TOWEL GREEN STERILE (TOWEL DISPOSABLE) ×9 IMPLANT
TOWEL GREEN STERILE FF (TOWEL DISPOSABLE) ×6 IMPLANT
TRAY FOLEY MTR SLVR 16FR STAT (SET/KITS/TRAYS/PACK) ×3 IMPLANT
TULIP CREP AMP 5.5MM (Orthopedic Implant) ×6 IMPLANT
WATER STERILE IRR 1000ML POUR (IV SOLUTION) ×3 IMPLANT

## 2021-01-18 NOTE — Progress Notes (Signed)
Orthopedic Tech Progress Note Patient Details:  [name] 08/24/1956 [id number] Called in order to [facility] for [place] Lumbar Brace  Patient ID: [name], male   DOB: [birth year], 65 y.o.   MRN: [id number]   Germaine Pomfret 01/18/2021, 1:18 PM

## 2021-01-18 NOTE — Anesthesia Procedure Notes (Addendum)
Procedure Name: Intubation Date/Time: 01/18/2021 7:46 AM Performed by: Janene Harvey, CRNA Pre-anesthesia Checklist: Patient identified, Emergency Drugs available, Suction available and Patient being monitored Patient Re-evaluated:Patient Re-evaluated prior to induction Oxygen Delivery Method: Circle system utilized Preoxygenation: Pre-oxygenation with 100% oxygen Induction Type: IV induction Ventilation: Mask ventilation without difficulty Laryngoscope Size: Mac and 4 Grade View: Grade I Tube type: Oral Number of attempts: 1 Airway Equipment and Method: Stylet and Oral airway Placement Confirmation: ETT inserted through vocal cords under direct vision,  positive ETCO2 and breath sounds checked- equal and bilateral Secured at: 23 cm Tube secured with: Tape Dental Injury: Teeth and Oropharynx as per pre-operative assessment  Comments: Intubation by [facility]

## 2021-01-18 NOTE — Anesthesia Procedure Notes (Signed)
Arterial Line Insertion Start/End3/28/2022 6:45 AM Performed by: Janene Harvey, CRNA  Preanesthetic checklist: patient identified, risks and benefits discussed and pre-op evaluation Lidocaine 1% used for infiltration Right, radial was placed Catheter size: 20 G Hand hygiene performed  and maximum sterile barriers used  Allen's test indicative of satisfactory collateral circulation Attempts: 1 Procedure performed without using ultrasound guided technique. Following insertion, dressing applied and Biopatch. Post procedure assessment: unchanged  Patient tolerated the procedure well with no immediate complications. Additional procedure comments: Arterial line placed by SRNA.

## 2021-01-18 NOTE — Op Note (Signed)
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 Monica Martinez  Assistant: Kristeen Miss  Anesthesia General  EBL: Minimal  Stage II: Expiration fusion disconnection and removal of hardware L2-L5 with placement bilateral S1 cortical screws and replacement new rods from L2 down to S1  2.  Posterior lateral arthrodesis L5-S1 utilizing vivigen and protios  Surgeon: Dominica Severin Avyukth Bontempo  Assistant: Nash Shearer  Anesthesia: General  EBL: Minimal  HPI: 65 year old gentleman progressive worsening back bilateral hip and leg pain brief responded to facet blocks and epidurals at L5-S1 work-up revealed progressive degenerative collapse marked facet arthropathy and degenerative disease L5-S1 below his previous L2-L5 construct.  Due to the patient's progression of clinical syndrome imaging findings and failed conservative treatment I recommended anterior lumbar to by fusion L5-S1 with posterior augmentation with cortical screws.  I extensively went over the risks and benefits of that operation with him as well as perioperative course expectations of outcome and alternatives of surgery and he understood and agreed to proceed forward.  Operative procedure: Patient brought into the OR was due to general anesthesia positioned supine for the first stage of the procedure which the exposure will be dictated by Dr. Monica Martinez.  After adequate exposure of the L5-S1 disc base was obtained intraoperative fluoroscopy identified midline and confirm level so I cleaned out the disc base this was noted markedly degenerated prepared the endplates utilizing curettes under bit both both posterior endplates of L5 and S1 with a 2 Miller Kerrison punch and then under  fluoroscopy sized up an implant and selected a 15 mm 8 degree implant packed it with the ostia cell pro and then inserted under fluoroscopy.  All 3 Henriette Combs were then deployed fluoroscopy confirmed the implant to be in good position in the end of the case all counts were correct confirmed by intraoperative x-ray.  This wound was then closed with erupted PDS in the external beak fascia and 2 oh's in the subcutaneous tissue and a running 4 subcuticular.  Patient was then repositioned for the second stage of the procedure.  His back was prepped and draped in routine sterile fashion his old incision was opened up and extended inferiorly he had to hold ART old hardware was identified extended inferiorly identified the entry point for the cortical screws at S1 under AP and lateral fluoroscopy I placed 2 cortical screws with excellent both screws had excellent purchase.  I then disconnected the knots remove the old rod contoured a new rod placed that locked everything in place I then aggressively decorticated the facet joint L5-S1 as well as a sacral ala and the lateral facet joint at L4-5 packed the mix of vivigen and Proteus posterior laterally from L5 down S1 then placed a medium Hemovac drain injected Exparel in the fascia and closed wound in layers with Vicryl in a running 4 subcuticular.  Dermabond benzoin Steri-Strips and sterile dressing was applied patient recovery in stable condition.  At the end the case all needle count sponge counts were correct.

## 2021-01-18 NOTE — Anesthesia Postprocedure Evaluation (Signed)
Anesthesia Post Note  Patient: [name]  Procedure(s) Performed: Anterior Lumbar Interbody Fusion - Lumbar Five-Sarcal One (N/A ) SACRAL ONE POSTERIOR SCREWS, AUGMENTATION OF FUSION FROM LUMBAR TWO TO SACRAL ONE (Back) ABDOMINAL EXPOSURE (N/A )     Patient location during evaluation: PACU Anesthesia Type: General Level of consciousness: awake and alert Pain management: pain level controlled Vital Signs Assessment: post-procedure vital signs reviewed and stable Respiratory status: spontaneous breathing, nonlabored ventilation, respiratory function stable and patient connected to nasal cannula oxygen Cardiovascular status: blood pressure returned to baseline and stable Postop Assessment: no apparent nausea or vomiting Anesthetic complications: no   No complications documented.  Last Vitals:  Vitals:   01/18/21 1430 01/18/21 1439  BP:  (!) 162/94  Pulse:  88  Resp:  18  Temp: 36.6 C 36.6 C  SpO2:  98%    Last Pain:  Vitals:   01/18/21 1439  TempSrc: Oral  PainSc:                  March Rummage Adalid Beckmann

## 2021-01-18 NOTE — H&P (Signed)
[name] is an 65 y.o. male.   Chief Complaint: Back bilateral hip and leg pain HPI: 65 year old gentleman progressive worsening back bilateral hip and leg pain previous L4-5 fusion presents now for L5-S1 anterior lumbar interbody fusion with posterior placement of cortical screws.  I extensively gone over the risks and benefits of that procedure with him as well as perioperative course expectations of outcome and alternatives of surgery and he understands and agrees to proceed forward.  Past Medical History:  Diagnosis Date  . Allergy   . Arthritis   . Cancer ([place])    skin  . Complication of anesthesia    "Woke up with knee surgery"  . Dysrhythmia    Hx of Afib  . Heart murmur   . History of kidney stones    passed 1  . HOH (hard of hearing)    right ear  . Hyperlipidemia   . Hypertension    Essential  . Hypokalemia   . Joint pain   . Spinal stenosis, lumbar region, with neurogenic claudication     Past Surgical History:  Procedure Laterality Date  . BACK SURGERY  2018  . COLONOSCOPY    . HERNIA REPAIR    . JOINT REPLACEMENT Left     x2 - knee  . LITHOTRIPSY    . [place] SURGERY  47/14/2020  . Lumbar disectomy  01/18/2019  . TRANSFORAMINAL LUMBAR INTERBODY FUSION (TLIF) WITH PEDICLE SCREW FIXATION 1 LEVEL N/A 06/12/2019   Procedure: LUMBAR FOUR-FIVE TRANSFORAMINAL LUMBAR INTERBODY FUSION (TLIF) WITH PEDICLE SCREW FIXATION;  Surgeon: Kary Kos, MD;  Location: [facility];  Service: Neurosurgery;  Laterality: N/A;    Family History  Problem Relation Age of Onset  . Cancer Mother   . Cancer Father   . Cancer Brother   . Hyperlipidemia Brother    Social History:  reports that he has never smoked. He quit smokeless tobacco use about 2 years ago.  His smokeless tobacco use included chew. He reports current alcohol use. He reports that he does not use drugs.  Allergies:  Allergies  Allergen Reactions  . Meloxicam Palpitations    Other reaction(s): arrythmia     Medications Prior to Admission  Medication Sig Dispense Refill  . apixaban (ELIQUIS) 5 MG TABS tablet Take 5 mg by mouth 2 (two) times daily.    . celecoxib (CELEBREX) 200 MG capsule Take 1 capsule (200 mg total) by mouth daily. 30 capsule 5  . diltiazem (CARDIZEM CD) 120 MG 24 hr capsule Take 1 capsule (120 mg total) by mouth in the morning and at bedtime. 180 capsule 1  . fexofenadine (ALLEGRA) 180 MG tablet Take 1 tablet by mouth daily (Patient taking differently: Take 180 mg by mouth every evening.) 90 tablet 0  . flecainide (TAMBOCOR) 100 MG tablet Take 100 mg by mouth daily as needed (afib).    . fluticasone (FLONASE) 50 MCG/ACT nasal spray Use 1 spray(s) in each nostril once daily (Patient taking differently: Place 1 spray into both nostrils daily as needed for allergies.) 16 g 1  . hydrochlorothiazide (HYDRODIURIL) 12.5 MG tablet Take 1 tablet (12.5 mg total) by mouth daily. 90 tablet 1  . lisinopril (ZESTRIL) 20 MG tablet Take 1 tablet (20 mg total) by mouth every evening. 90 tablet 1  . metoprolol tartrate (LOPRESSOR) 50 MG tablet Take 0.5 tablets (25 mg total) by mouth 2 (two) times daily. 90 tablet 1  . montelukast (SINGULAIR) 10 MG tablet Take 1 tablet (10 mg  total) by mouth at bedtime. 90 tablet 1  . Multiple Vitamins-Minerals (MULTIVITAMIN WITH MINERALS) tablet Take 1 tablet by mouth daily.    . Olopatadine HCl 0.2 % SOLN Place 1 drop into both eyes 2 (two) times daily as needed (allergies.).    Marland Kitchen pantoprazole (PROTONIX) 40 MG tablet Take 1 tablet (40 mg total) by mouth daily. 90 tablet 1  . potassium chloride SA (KLOR-CON) 20 MEQ tablet Take 3 tablets (60 mEq total) by mouth daily. (Patient taking differently: Take 20-40 mEq by mouth See admin instructions. 40 meq in the morning, 20 meq in the evening) 270 tablet 1  . tadalafil (CIALIS) 5 MG tablet Take 1-2 tablets (5-10 mg total) by mouth daily as needed for erectile dysfunction. 30 tablet 11  . tamsulosin (FLOMAX) 0.4 MG CAPS  capsule Take 1 capsule (0.4 mg total) by mouth daily. 90 capsule 3    Results for orders placed or performed during the hospital encounter of 01/18/21 (from the past 48 hour(s))  Prepare RBC (crossmatch)     Status: None   Collection Time: 01/18/21  7:01 AM  Result Value Ref Range   Order Confirmation      ORDER PROCESSED BY BLOOD BANK Performed at [facility], [address] [place], [place] [address]    No results found.  Review of Systems  Musculoskeletal: Positive for back pain.  Neurological: Positive for numbness.    Blood pressure (!) 185/101, pulse (!) 58, temperature 98.3 F (36.8 C), resp. rate 17, height 6\' 1"  (1.854 m), weight 112.9 kg, SpO2 96 %. Physical Exam HENT:     Head: Normocephalic.     Nose: Nose normal.  Eyes:     Pupils: Pupils are equal, round, and reactive to light.  Cardiovascular:     Rate and Rhythm: Normal rate.  Pulmonary:     Effort: Pulmonary effort is normal.  Abdominal:     General: Abdomen is flat.  Musculoskeletal:        General: Normal range of motion.  Skin:    General: Skin is warm.  Neurological:     General: No focal deficit present.     Mental Status: He is alert.     Comments: Patient awake alert strength is 5-5 iliopsoas, quads, hamstrings, gastroc, tibialis, and EHL.      Assessment/Plan 65 year old gentleman presents for anterior lumbar interbody fusion with repositioning for posterior cortical screw placement.  Elaina Hoops, MD 01/18/2021, 7:27 AM

## 2021-01-18 NOTE — H&P (Signed)
History and Physical Interval Note:  01/18/2021 7:29 AM  [name]  has presented today for surgery, with the diagnosis of Stenosis.  The various methods of treatment have been discussed with the patient and family. After consideration of risks, benefits and other options for treatment, the patient has consented to  Procedure(s): ALIF - L5-S1 (N/A) ABDOMINAL EXPOSURE (N/A) as a surgical intervention.  The patient's history has been reviewed, patient examined, no change in status, stable for surgery.  I have reviewed the patient's chart and labs.  Questions were answered to the patient's satisfaction.    L5-S1 ALIF  [name]  Patient name: [name]       MRN: [id number]        DOB: 1956-06-07          Sex: male  REASON FOR CONSULT: Evaluate for L5-S1 ALIF  HPI: [name] is a 65 y.o. male, with hx hypertension, hyperlipidemia, A. fib on anticoagulation and chronic lower back pain who presents for preop evaluation of L5-S1 ALIF.  Patient states he has had lower back pain with right leg radiculopathy for the last 6 months and had minimal relief with multiple injections.  He has had multiple previous surgeries including an L4-L5 TLIF on 06/12/2019 and then a PLIF L2-3, L3-4 with cortical screws L2-5 on 03/16/2020.  His only previous abdominal surgery is umbilical hernia repair.  He does work for the [employer] association.      Past Medical History:  Diagnosis Date  . Allergy   . Arthritis   . Cancer ([place])    skin  . Complication of anesthesia    "Woke up with knee surgery"  . Dysrhythmia    Hx of Afib  . Heart murmur   . History of kidney stones    passed 1  . HOH (hard of hearing)    right ear  . Hyperlipidemia   . Hypertension    Essential  . Hypokalemia   . Joint pain   . Spinal stenosis, lumbar region, with neurogenic claudication          Past Surgical History:  Procedure Laterality Date  . BACK SURGERY   2018  . COLONOSCOPY    . HERNIA REPAIR    . JOINT REPLACEMENT Left     x2  . LITHOTRIPSY    . [place] SURGERY  47/14/2020  . Lumbar disectomy  01/18/2019  . TRANSFORAMINAL LUMBAR INTERBODY FUSION (TLIF) WITH PEDICLE SCREW FIXATION 1 LEVEL N/A 06/12/2019   Procedure: LUMBAR FOUR-FIVE TRANSFORAMINAL LUMBAR INTERBODY FUSION (TLIF) WITH PEDICLE SCREW FIXATION;  Surgeon: Kary Kos, MD;  Location: [facility];  Service: Neurosurgery;  Laterality: N/A;         Family History  Problem Relation Age of Onset  . Cancer Mother   . Cancer Father   . Cancer Brother   . Hyperlipidemia Brother     SOCIAL HISTORY: Social History        Socioeconomic History  . Marital status: Married    Spouse name: Not on file  . Number of children: Not on file  . Years of education: Not on file  . Highest education level: Not on file  Occupational History  . Not on file  Tobacco Use  . Smoking status: Former Research scientist (life sciences)  . Smokeless tobacco: Former Systems developer    Types: Chew    Quit date: 12/2018  . Tobacco comment: Quit Fall 2020  Vaping Use  . Vaping Use: Never used  Substance and Sexual Activity  . Alcohol use: Yes    Comment: 2 drinks a month  . Drug use: Never  . Sexual activity: Yes  Other Topics Concern  . Not on file  Social History Narrative  . Not on file   Social Determinants of Health      Financial Resource Strain: Low Risk   . Difficulty of Paying Living Expenses: Not hard at all  Food Insecurity: No Food Insecurity  . Worried About Charity fundraiser in the Last Year: Never true  . Ran Out of Food in the Last Year: Never true  Transportation Needs: No Transportation Needs  . Lack of Transportation (Medical): No  . Lack of Transportation (Non-Medical): No  Physical Activity: Inactive  . Days of Exercise per Week: 0 days  . Minutes of Exercise per Session: 0 min  Stress: No Stress Concern Present  . Feeling of Stress : Not at all  Social Connections:  Socially Integrated  . Frequency of Communication with Friends and Family: More than three times a week  . Frequency of Social Gatherings with Friends and Family: More than three times a week  . Attends Religious Services: More than 4 times per year  . Active Member of Clubs or Organizations: Yes  . Attends Archivist Meetings: More than 4 times per year  . Marital Status: Married  Human resources officer Violence: Not At Risk  . Fear of Current or Ex-Partner: No  . Emotionally Abused: No  . Physically Abused: No  . Sexually Abused: No         Allergies  Allergen Reactions  . Meloxicam Palpitations    Other reaction(s): arrythmia          Current Outpatient Medications  Medication Sig Dispense Refill  . celecoxib (CELEBREX) 200 MG capsule Take 1 capsule (200 mg total) by mouth daily. 30 capsule 5  . diltiazem (CARDIZEM CD) 120 MG 24 hr capsule Take 1 capsule (120 mg total) by mouth in the morning and at bedtime. 180 capsule 1  . fexofenadine (ALLEGRA) 180 MG tablet Take 1 tablet by mouth daily (Patient taking differently: Take 180 mg by mouth every evening.) 90 tablet 0  . fluticasone (FLONASE) 50 MCG/ACT nasal spray Use 1 spray(s) in each nostril once daily (Patient taking differently: Place 1 spray into both nostrils daily as needed for allergies.) 16 g 1  . hydrochlorothiazide (HYDRODIURIL) 12.5 MG tablet Take 1 tablet (12.5 mg total) by mouth daily. 90 tablet 1  . lisinopril (ZESTRIL) 20 MG tablet Take 1 tablet (20 mg total) by mouth every evening. 90 tablet 1  . metoprolol tartrate (LOPRESSOR) 50 MG tablet Take 0.5 tablets (25 mg total) by mouth 2 (two) times daily. 90 tablet 1  . montelukast (SINGULAIR) 10 MG tablet Take 1 tablet (10 mg total) by mouth at bedtime. 90 tablet 1  . Multiple Vitamins-Minerals (MULTIVITAMIN WITH MINERALS) tablet Take 1 tablet by mouth daily.    . Olopatadine HCl 0.2 % SOLN Place 1 drop into both eyes 2 (two) times daily as needed  (allergies.).    Marland Kitchen pantoprazole (PROTONIX) 40 MG tablet Take 1 tablet (40 mg total) by mouth daily. 90 tablet 1  . potassium chloride SA (KLOR-CON) 20 MEQ tablet Take 3 tablets (60 mEq total) by mouth daily. (Patient taking differently: Take 20-40 mEq by mouth See admin instructions. 40 meq in the morning, 20 meq in the evening) 270 tablet 1  . tadalafil (CIALIS) 5 MG tablet  Take 1-2 tablets (5-10 mg total) by mouth daily as needed for erectile dysfunction. 30 tablet 11  . tamsulosin (FLOMAX) 0.4 MG CAPS capsule Take 1 capsule (0.4 mg total) by mouth daily. 90 capsule 3  . apixaban (ELIQUIS) 5 MG TABS tablet Take 5 mg by mouth 2 (two) times daily. (Patient not taking: Reported on 01/12/2021)    . flecainide (TAMBOCOR) 100 MG tablet Take 100 mg by mouth daily as needed (afib). (Patient not taking: Reported on 01/12/2021)     No current facility-administered medications for this visit.    REVIEW OF SYSTEMS:  [X]  denotes positive finding, [ ]  denotes negative finding Cardiac  Comments:  Chest pain or chest pressure:    Shortness of breath upon exertion:    Short of breath when lying flat:    Irregular heart rhythm:        Vascular    Pain in calf, thigh, or hip brought on by ambulation:    Pain in feet at night that wakes you up from your sleep:     Blood clot in your veins:    Leg swelling:         Pulmonary    Oxygen at home:    Productive cough:     Wheezing:         Neurologic    Sudden weakness in arms or legs:     Sudden numbness in arms or legs:     Sudden onset of difficulty speaking or slurred speech:    Temporary loss of vision in one eye:     Problems with dizziness:         Gastrointestinal    Blood in stool:     Vomited blood:         Genitourinary    Burning when urinating:     Blood in urine:        Psychiatric    Major depression:         Hematologic    Bleeding problems:     Problems with blood clotting too easily:        Skin    Rashes or ulcers:        Constitutional    Fever or chills:      PHYSICAL EXAM:    Vitals:   01/12/21 1600  BP: (!) 166/100  Pulse: 71  Resp: 18  Temp: 98.1 F (36.7 C)  TempSrc: Temporal  SpO2: 97%  Weight: 245 lb (111.1 kg)  Height: 6\' 1"  (1.854 m)    GENERAL: The patient is a well-nourished male, in no acute distress. The vital signs are documented above. CARDIAC: There is a regular rate and rhythm.  VASCULAR:  Palpable femoral pulses bilaterally Palpable DP PT pulses bilateral PULMONARY: No respiratory distress. ABDOMEN: Soft and non-tender. MUSCULOSKELETAL: There are no major deformities or cyanosis. NEUROLOGIC: No focal weakness or paresthesias are detected. SKIN: There are no ulcers or rashes noted. PSYCHIATRIC: The patient has a normal affect.  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  Posterior instrumentation L2-L5.  Assessment/Plan:  65 year old malepresents for preop evaluation of L5-S1 ALIF for chronic lower back pain with right leg radiculopathy.  I reviewed his MRI with him and his wife and I think he would be a good candidate for anterior approach.  We discussed transverse incision over the left rectus muscle  and mobilization of the left rectus and entering the retroperitoneal space to mobilize the peritoneum and left ureter to midline including moving the iliac arteries and veins to expose the L5-S1 disc from the front.  We discussed injury to the above structures. Look forward to assisting Dr. Saintclair Halsted.   Justin Heck, MD [facility]: [phone]

## 2021-01-18 NOTE — Op Note (Signed)
Date: January 18, 2021  Preoperative diagnosis: Chronic lower back pain  Postoperative diagnosis: Same  Procedure: Anterior spine exposure at the L5-S1 disc space via anterior retroperitoneal approach for L5-S1 ALIF  Surgeon: Dr. [name], MD  Co-surgeon: Dr. Kary Kos, MD  Indication: Patient is a 65 year old male who presents with chronic lower back pain after previous TLIF and PLIF.  He has been evaluated by Dr. Saintclair Halsted after failing conservative management who has recommended anterior lumbar interbody fusion at L5-S1.  Vascular surgery was asked to assist with abdominal exposure.  He presents after risk benefits discussed.  Findings: Transverse incision over the left rectus muscle at the L5-S1 disc space.  Left rectus muscle fully mobilized.  Entered retroperitoneum and mobilized the peritoneum and left ureter to midline.  Given previous posterior spinal surgery the left iliac vein and middle sacral vessels were scarred and very adherent to the anterior disc space.  This required very careful dissection/mobilization.  Middle sacral vessels were divided between clips.  Ultimately fixed retractor was placed and we confirmed we were at the correct level L5-S1 with a needle in the disc space.  Anesthesia: General  Details: Patient was taken to the operating room after informed consent was obtained.  Placed on the operative table supine position.  General endotracheal anesthesia was induced.  Fluoroscopic C-arm was brought in the lateral position and the L5-S1 disc space was marked over the left rectus muscle.  Abdominal wall was then prepped and draped in usual sterile fashion.  Timeout was performed.  He did get antibiotics.  Subsequently made a transverse incision over the preoperative mark over the left rectus muscle with scalpel and dissected down through the subcutaneous tissue with Bovie cautery.  Cerebellar retractors were used for added visualization.  Anterior rectus sheath was  opened transversely with Bovie cautery.  Flaps were raised underneath the anterior rectus sheath.  I then circumferentially mobilized the left rectus muscle and entered lateral to the muscle into the retroperitoneal space and mobilized the peritoneum and left ureter to the midline.  I did take down a little bit of the posterior sheath above the arcuate line with Metzenbaum scissors.  Dr. Saintclair Halsted then used hand-held Wiley retractors to pull the peritoneum and ureter to the midline and I could visualize the left iliac vessels as well as the psoas and the anterior disc space.  I then carefully mobilized anterior on the disc space with suction and KD.  Given his previous posterior spinal hardware there was significant inflammation anteriorly and the vessels were very adherent.  Ultimately I was able to divide the sacral vessels between clips and ties and divided these.  I was able to very carefully mobilized the left iliac vein lateral to the disc space given this was severely adherent.  Finally was able to get enough working room on each side of the disc space that a fixed Thompson retractor was placed and 150 reverse lip retractors were placed on each side of the disc space with a 140 malleable cranial and 190 malleable caudal retractor blade.  Spinal needle was placed in the disc base and we confirmed on lateral fluoroscopy that we were at the correct level.  Case was turned over to Dr. Saintclair Halsted.  Please see his dictation for the remainder of the case.  Complication: None  Condition: Stable  [name], MD [facility] Office: [place]

## 2021-01-18 NOTE — Transfer of Care (Signed)
Immediate Anesthesia Transfer of Care Note  Patient: [name]  Procedure(s) Performed: Anterior Lumbar Interbody Fusion - Lumbar Five-Sarcal One (N/A ) SACRAL ONE POSTERIOR SCREWS, AUGMENTATION OF FUSION FROM LUMBAR TWO TO SACRAL ONE (Back) ABDOMINAL EXPOSURE (N/A )  Patient Location: PACU  Anesthesia Type:General  Level of Consciousness: drowsy and patient cooperative  Airway & Oxygen Therapy: Patient Spontanous Breathing and Patient connected to face mask oxygen  Post-op Assessment: Report given to RN, Post -op Vital signs reviewed and stable and Patient moving all extremities X 4  Post vital signs: Reviewed  Last Vitals:  Vitals Value Taken Time  BP 152/94 01/18/21 1228  Temp    Pulse 90 01/18/21 1230  Resp 15 01/18/21 1230  SpO2 96 % 01/18/21 1230  Vitals shown include unvalidated device data.  Last Pain: There were no vitals filed for this visit.       Complications: No complications documented.

## 2021-01-19 MED ORDER — SENNOSIDES-DOCUSATE SODIUM 8.6-50 MG PO TABS
1.0000 | ORAL_TABLET | Freq: Once | ORAL | Status: AC
  Administered 2021-01-19: 1 via ORAL
  Filled 2021-01-19: qty 1

## 2021-01-19 MED ORDER — PANTOPRAZOLE SODIUM 40 MG PO TBEC
40.0000 mg | DELAYED_RELEASE_TABLET | Freq: Every day | ORAL | Status: DC
  Administered 2021-01-19: 40 mg via ORAL
  Filled 2021-01-19: qty 1

## 2021-01-19 MED ORDER — BISACODYL 10 MG RE SUPP
10.0000 mg | Freq: Every day | RECTAL | Status: DC | PRN
  Administered 2021-01-19: 10 mg via RECTAL
  Filled 2021-01-19: qty 1

## 2021-01-19 MED ORDER — CHLORPROMAZINE HCL 25 MG PO TABS
25.0000 mg | ORAL_TABLET | Freq: Four times a day (QID) | ORAL | Status: DC | PRN
  Administered 2021-01-19 – 2021-01-20 (×3): 25 mg via ORAL
  Filled 2021-01-19 (×4): qty 1

## 2021-01-19 MED ORDER — OXYCODONE-ACETAMINOPHEN 5-325 MG PO TABS: 1.0000 | ORAL_TABLET | ORAL | 0 refills | Status: DC | PRN

## 2021-01-19 MED ORDER — MAGNESIUM HYDROXIDE 400 MG/5ML PO SUSP
30.0000 mL | Freq: Every day | ORAL | Status: DC | PRN
  Administered 2021-01-19: 30 mL via ORAL
  Filled 2021-01-19: qty 30

## 2021-01-19 MED ORDER — METHOCARBAMOL 500 MG PO TABS: 500.0000 mg | ORAL_TABLET | Freq: Four times a day (QID) | ORAL | 0 refills | Status: DC

## 2021-01-19 NOTE — Evaluation (Signed)
Occupational Therapy Evaluation Patient Details Name: [name] MRN: [id number] DOB: 1956/04/27 Today's Date: 01/19/2021    History of Present Illness   65 yo male s/p L5-S1 ALIF. PMH including former smoker, HTN, HLD, PAF (onsent > 5 years ago), hard of hearing (right), murmur, skin cancer, several prior back surgeries.    Clinical Impression   PTA, pt was living with his wife and was independent. Currently, pt performing at Mod I level for ADLs and functional mobility. Provided education and handout on back precautions, brace management, grooming, UB ADLs, LB ADLs, toileting, and shower transfer with shower seat; pt demonstrated understanding. Answered all pt questions. Recommend dc home once medically stable per physician. All acute OT needs met and will sign off. Thank you.    Follow Up Recommendations  No OT follow up;Supervision - Intermittent    Equipment Recommendations  None recommended by OT    Recommendations for Other Services PT consult     Precautions / Restrictions Precautions Precautions: Back      Mobility Bed Mobility Overal bed mobility: Modified Independent                  Transfers Overall transfer level: Modified independent                    Balance Overall balance assessment: No apparent balance deficits (not formally assessed)                                         ADL either performed or assessed with clinical judgement   ADL Overall ADL's : Modified independent                                       General ADL Comments: Pt performing ADLs and funcitonal mobility at Mod I level. Providing education and handout on back precautions, brace management, bed mobility, UB ADLs, LB ADLs, toileting, and shower transfer     Vision         Perception     Praxis      Pertinent Vitals/Pain Pain Assessment: Faces Faces Pain Scale: Hurts a little bit Pain Location: Back Pain Descriptors /  Indicators: Constant;Discomfort;Grimacing Pain Intervention(s): Monitored during session;Limited activity within patient's tolerance;Repositioned     Hand Dominance Right   Extremity/Trunk Assessment Upper Extremity Assessment Upper Extremity Assessment: Overall WFL for tasks assessed   Lower Extremity Assessment Lower Extremity Assessment: Defer to PT evaluation   Cervical / Trunk Assessment Cervical / Trunk Assessment: Other exceptions Cervical / Trunk Exceptions: s/p back sx   Communication Communication Communication: HOH   Cognition Arousal/Alertness: Awake/alert Behavior During Therapy: WFL for tasks assessed/performed Overall Cognitive Status: Within Functional Limits for tasks assessed                                     General Comments       Exercises     Shoulder Instructions      Home Living Family/patient expects to be discharged to:: Private residence Living Arrangements: Spouse/significant other Available Help at Discharge: Family;Available 24 hours/day Type of Home: House Home Access: Stairs to enter [employer] of Steps: 5 Entrance Stairs-Rails: Left Home Layout: Two level Alternate Level Stairs-Number of  Steps: 12 Alternate Level Stairs-Rails: Can reach both Bathroom Shower/Tub: Occupational psychologist: Handicapped height     Home Equipment: Environmental consultant - 2 wheels;Cane - single point;Shower seat;Adaptive equipment Adaptive Equipment: Reacher        Prior Functioning/Environment Level of Independence: Independent with assistive device(s)                 OT Problem List: Impaired balance (sitting and/or standing);Decreased knowledge of precautions;Decreased knowledge of use of DME or AE;Decreased activity tolerance;Pain      OT Treatment/Interventions:      OT Goals(Current goals can be found in the care plan section) Acute Rehab OT Goals Patient Stated Goal: Go home OT Goal Formulation: All  assessment and education complete, DC therapy  OT Frequency:     Barriers to D/C:            Co-evaluation              AM-PAC OT "6 Clicks" Daily Activity     Outcome Measure Help from another person eating meals?: None Help from another person taking care of personal grooming?: None Help from another person toileting, which includes using toliet, bedpan, or urinal?: None Help from another person bathing (including washing, rinsing, drying)?: None Help from another person to put on and taking off regular upper body clothing?: None Help from another person to put on and taking off regular lower body clothing?: None 6 Click Score: 24   End of Session Equipment Utilized During Treatment: Back brace Nurse Communication: Mobility status  Activity Tolerance: Patient tolerated treatment well Patient left: in bed;with call bell/phone within reach  OT Visit Diagnosis: Unsteadiness on feet (R26.81);Other abnormalities of gait and mobility (R26.89);Muscle weakness (generalized) (M62.81)                Time: 4270-6237 OT Time Calculation (min): 13 min Charges:  OT General Charges $OT Visit: 1 Visit OT Evaluation $OT Eval Low Complexity: [place], OTR/L Acute Rehab Pager: [phone] Office: [place] 01/19/2021, 9:14 AM

## 2021-01-19 NOTE — Evaluation (Signed)
Physical Therapy Evaluation Patient Details Name: [name] MRN: [id number] DOB: 04/09/1956 Today's Date: 01/19/2021   History of Present Illness  Pt is a 65 y/o male who presents s/p L5-S1 ALIF on 01/18/2021. PMH significant for HTN, heart murmur, a-fib, CA, TLIF L4-5 06/12/2019, other prior back surgery x3, L TKR.  Clinical Impression  Pt admitted with above diagnosis. At the time of PT eval, pt was able to demonstrate transfers and ambulation with modified independence to supervision for safety (no AD), and stair training with min guard assist. Pt was educated on precautions, brace application/wearing schedule, appropriate activity progression, and car transfer. Pt currently with functional limitations due to the deficits listed below (see PT Problem List). Pt will benefit from skilled PT to increase their independence and safety with mobility to allow discharge to the venue listed below.      Follow Up Recommendations No PT follow up;Supervision for mobility/OOB    Equipment Recommendations  None recommended by PT    Recommendations for Other Services       Precautions / Restrictions Precautions Precautions: Back Precaution Booklet Issued: Yes (comment) Precaution Comments: Reviewed handout and pt was cued for precautions during functional mobility. Required Braces or Orthoses: Spinal Brace Spinal Brace: Lumbar corset;Applied in sitting position Restrictions Weight Bearing Restrictions: No      Mobility  Bed Mobility Overal bed mobility: Modified Independent             General bed mobility comments: HOB flat and rails lowered to simulate home environment. No assist required and pt was able to transition to sitting without difficulty. Good log roll technique.    Transfers Overall transfer level: Modified independent Equipment used: None             General transfer comment: VC's for improved posture. No assist required.  Ambulation/Gait Ambulation/Gait  assistance: Supervision Gait Distance (Feet): 250 Feet Assistive device: None Gait Pattern/deviations: Step-through pattern;Decreased stride length;Trunk flexed Gait velocity: Decreased Gait velocity interpretation: <1.31 ft/sec, indicative of household ambulator General Gait Details: VC's for improved posture. Generally slow and guarded but grossly unsteady.  Stairs Stairs: Yes Stairs assistance: Min guard Stair Management: One rail Left;Step to pattern;Forwards Number of Stairs: 10 General stair comments: VC's for sequencing and general safety. No assist required.  Wheelchair Mobility    Modified Rankin (Stroke Patients Only)       Balance Overall balance assessment: No apparent balance deficits (not formally assessed)                                           Pertinent Vitals/Pain Pain Assessment: Faces Faces Pain Scale: Hurts a little bit Pain Location: Back Pain Descriptors / Indicators: Constant;Discomfort;Grimacing Pain Intervention(s): Limited activity within patient's tolerance;Monitored during session;Repositioned    Home Living Family/patient expects to be discharged to:: Private residence Living Arrangements: Spouse/significant other Available Help at Discharge: Family;Available 24 hours/day Type of Home: House Home Access: Stairs to enter Entrance Stairs-Rails: Left Entrance Stairs-Number of Steps: 5 Home Layout: Two level Home Equipment: Walker - 2 wheels;Cane - single point;Shower seat;Adaptive equipment Additional Comments: Pt must use 2nd level to reach bedroom; pt had high commode on ML    Prior Function Level of Independence: Independent with assistive device(s)         Comments: Pt was ambulating independently prior to surgery, however, used walking stick for steadying during gait. works as  Development worker, [employer]- desk work.     Hand Dominance   Dominant Hand: Right    Extremity/Trunk Assessment   Upper Extremity  Assessment Upper Extremity Assessment: Defer to OT evaluation    Lower Extremity Assessment Lower Extremity Assessment: Generalized weakness    Cervical / Trunk Assessment Cervical / Trunk Assessment: Other exceptions Cervical / Trunk Exceptions: s/p back sx  Communication   Communication: HOH  Cognition Arousal/Alertness: Awake/alert Behavior During Therapy: WFL for tasks assessed/performed Overall Cognitive Status: Within Functional Limits for tasks assessed                                        General Comments      Exercises     Assessment/Plan    PT Assessment Patient needs continued PT services  PT Problem List Decreased strength;Decreased activity tolerance;Decreased balance;Decreased mobility;Decreased knowledge of use of DME;Decreased safety awareness;Decreased knowledge of precautions;Pain       PT Treatment Interventions DME instruction;Gait training;Stair training;Functional mobility training;Therapeutic activities;Therapeutic exercise;Neuromuscular re-education;Patient/family education    PT Goals (Current goals can be found in the Care Plan section)  Acute Rehab PT Goals Patient Stated Goal: Go home PT Goal Formulation: With patient Time For Goal Achievement: 01/26/21 Potential to Achieve Goals: Fair    Frequency Min 5X/week   Barriers to discharge        Co-evaluation               AM-PAC PT "6 Clicks" Mobility  Outcome Measure Help needed turning from your back to your side while in a flat bed without using bedrails?: None Help needed moving from lying on your back to sitting on the side of a flat bed without using bedrails?: None Help needed moving to and from a bed to a chair (including a wheelchair)?: None Help needed standing up from a chair using your arms (e.g., wheelchair or bedside chair)?: None Help needed to walk in hospital room?: A Little Help needed climbing 3-5 steps with a railing? : A Little 6 Click Score:  22    End of Session Equipment Utilized During Treatment: Gait belt;Back brace Activity Tolerance: Patient tolerated treatment well Patient left: Other (comment) (Sitting EOB with OT present) Nurse Communication: Mobility status PT Visit Diagnosis: Unsteadiness on feet (R26.81);Pain Pain - part of body:  (abdomen at incision site)    Time: 3790-2409 PT Time Calculation (min) (ACUTE ONLY): 17 min   Charges:   PT Evaluation $PT Eval Low Complexity: 1 Low          Rolinda Roan, PT, DPT Acute Rehabilitation Services Pager: [phone] Office: [phone]   Thelma Comp 01/19/2021, 9:24 AM

## 2021-01-19 NOTE — Discharge Instructions (Signed)
Wound Care  Keep the incision clean and dry remove the outer dressing in 2 days Take a shower Do not put any creams, lotions, or ointments on incision. Leave steri-strips on back.  They will fall off by themselves.  Activity Walk each and every day, increasing distance each day. No lifting greater than 5 lbs.  No lifting no bending no twisting no driving or riding a car unless coming back and forth to see me. If provided with back brace, wear when out of bed.  It is not necessary to wear brace in bed. Diet Resume your normal diet.   Return to Work Will be discussed at you follow up appointment.  Call Your Doctor If Any of These Occur Redness, drainage, or swelling at the wound.  Temperature greater than 101 degrees. Severe pain not relieved by pain medication. Incision starts to come apart. Follow Up Appt Call today for appointment in 1-2 weeks ([phone]) or for problems.  If you have any hardware placed in your spine, you will need an x-ray before your appointment.

## 2021-01-19 NOTE — Progress Notes (Signed)
[facility]  Subjective  - ambulated.  No nausea.  No flatus yet.   Objective 140/82 66 97.7 F (36.5 C) (Oral) 17 98%  Intake/Output Summary (Last 24 hours) at 01/19/2021 0750 Last data filed at 01/19/2021 0447 Gross per 24 hour  Intake 2650 ml  Output 1435 ml  Net 1215 ml    Appropriate postop incisional tenderness of abdomen Left DP palpable Slightly distended  Laboratory Lab Results: No results for input(s): WBC, HGB, HCT, PLT in the last 72 hours. BMET No results for input(s): NA, K, CL, CO2, GLUCOSE, BUN, CREATININE, CALCIUM in the last 72 hours.  COAG Lab Results  Component Value Date   INR 1.1 03/13/2020   No results found for: PTT  Assessment/Planning:  Postop day 1 status post L5-S1 ALIF.  Appropriate postop incisional tenderness.  Palpable pedal pulse in the left foot.  Tolerating liquids but waiting to pass gas.  Overall looks good.  [name] 01/19/2021 7:50 AM --

## 2021-01-20 ENCOUNTER — Encounter (HOSPITAL_COMMUNITY): Payer: Self-pay | Admitting: Neurosurgery

## 2021-01-20 NOTE — Progress Notes (Signed)
[facility]  Subjective  -no complaints.  Ambulated.  Tolerating p.o. and passing gas.   Objective 123/76 63 98.2 F (36.8 C) (Oral) 17 95% No intake or output data in the 24 hours ending 01/20/21 0942  Left DP palpable Appropriate postop incisional tenderness  Laboratory Lab Results: No results for input(s): WBC, HGB, HCT, PLT in the last 72 hours. BMET No results for input(s): NA, K, CL, CO2, GLUCOSE, BUN, CREATININE, CALCIUM in the last 72 hours.  COAG Lab Results  Component Value Date   INR 1.1 03/13/2020   No results found for: PTT  Assessment/Planning:  65 year old male postop day 2 status post L5-S1 ALIF.  Overall looks good.  Tolerating p.o.  Passing gas.  Abdomen is soft.  Appropriate postop incisional tenderness palpable pedal pulse in left foot.  No concerns from my standpoint.  [name] 01/20/2021 9:42 AM --

## 2021-01-20 NOTE — Progress Notes (Signed)
Patient alert and oriented, voiding adequately, MAE well with no difficulty. Incision area cdi with no s/s of infection. Patient discharged home per order. Patient and spouse stated understanding of discharge instructions given. Patient has an appointment with Dr. Saintclair Halsted.

## 2021-01-20 NOTE — Discharge Summary (Signed)
Physician Discharge Summary  Patient ID: [name] MRN: [id number] DOB/AGE: Mar 23, 1956 65 y.o. Estimated body mass index is 32.84 kg/m as calculated from the following:   Height as of this encounter: 6' 1"  (1.854 m).   Weight as of this encounter: 112.9 kg.   Admit date: 01/18/2021 Discharge date: 01/20/2021  Admission Diagnoses: Degenerative disease lumbar spinal stenosis L5-S1  Discharge Diagnoses: Same Active Problems:   Spinal stenosis of lumbosacral region   Discharged Condition: good  Hospital Course: Patient was met hospital underwent anterior lumbar interbody fusion L5-S1 with posterior augmentation with cortical screws tying into his old construct from L2-L5 postop the patient very well covering the floor on the floor was ambulating voiding spontaneously tolerating regular diet we kept him around because it took a couple days for him to start moving gas through and have a bowel movement the patient will be discharged and discharged on scheduled follow-up in 1 to 2 weeks on oxycodone and Robaxin.  Consults: Significant Diagnostic Studies: Treatments: Anterior lumbar to body fusion with posterior augmentation L5-S1 Discharge Exam: Blood pressure 123/80, pulse 63, temperature (!) 97.5 F (36.4 C), temperature source Oral, resp. rate 18, height 6' 1"  (1.854 m), weight 112.9 kg, SpO2 96 %. Length out of 5 wound clean dry and intact  Disposition: Home   Allergies as of 01/20/2021      Reactions   Meloxicam Palpitations   Other reaction(s): arrythmia      Medication List    TAKE these medications   apixaban 5 MG Tabs tablet Commonly known as: ELIQUIS Take 5 mg by mouth 2 (two) times daily.   celecoxib 200 MG capsule Commonly known as: CELEBREX Take 1 capsule (200 mg total) by mouth daily.   diltiazem 120 MG 24 hr capsule Commonly known as: Cardizem CD Take 1 capsule (120 mg total) by mouth in the morning and at bedtime.   fexofenadine 180 MG  tablet Commonly known as: ALLEGRA Take 1 tablet by mouth daily What changed: when to take this   flecainide 100 MG tablet Commonly known as: TAMBOCOR Take 100 mg by mouth daily as needed (afib).   fluticasone 50 MCG/ACT nasal spray Commonly known as: FLONASE Use 1 spray(s) in each nostril once daily What changed:   how much to take  how to take this  when to take this  reasons to take this  additional instructions   hydrochlorothiazide 12.5 MG tablet Commonly known as: HYDRODIURIL Take 1 tablet (12.5 mg total) by mouth daily.   lisinopril 20 MG tablet Commonly known as: ZESTRIL Take 1 tablet (20 mg total) by mouth every evening.   methocarbamol 500 MG tablet Commonly known as: Robaxin Take 1 tablet (500 mg total) by mouth 4 (four) times daily.   metoprolol tartrate 50 MG tablet Commonly known as: LOPRESSOR Take 0.5 tablets (25 mg total) by mouth 2 (two) times daily.   montelukast 10 MG tablet Commonly known as: SINGULAIR Take 1 tablet (10 mg total) by mouth at bedtime.   multivitamin with minerals tablet Take 1 tablet by mouth daily.   Olopatadine HCl 0.2 % Soln Place 1 drop into both eyes 2 (two) times daily as needed (allergies.).   oxyCODONE-acetaminophen 5-325 MG tablet Commonly known as: Percocet Take 1 tablet by mouth every 4 (four) hours as needed for severe pain.   pantoprazole 40 MG tablet Commonly known as: PROTONIX Take 1 tablet (40 mg total) by mouth daily.   potassium chloride SA 20 MEQ tablet Commonly known  as: KLOR-CON Take 3 tablets (60 mEq total) by mouth daily. What changed:   how much to take  when to take this  additional instructions   tadalafil 5 MG tablet Commonly known as: CIALIS Take 1-2 tablets (5-10 mg total) by mouth daily as needed for erectile dysfunction.   tamsulosin 0.4 MG Caps capsule Commonly known as: FLOMAX Take 1 capsule (0.4 mg total) by mouth daily.       Follow-up Information    Kary Kos, MD  Follow up.   Specialty: Neurosurgery Contact information: [address] [place] [address] [phone]               Signed: Elaina Hoops 01/20/2021, 2:37 PM

## 2021-01-20 NOTE — Progress Notes (Signed)
Physical Therapy Treatment and Discharge Patient Details Name: [name] MRN: [id number] DOB: 1956/05/02 Today's Date: 01/20/2021    History of Present Illness Pt is a 64 y/o male who presents s/p L5-S1 ALIF on 01/18/2021. PMH significant for HTN, heart murmur, a-fib, CA, TLIF L4-5 06/12/2019, other prior back surgery x3, L TKR.    PT Comments    Pt progressing well with post-op mobility. He was able to demonstrate transfers and ambulation with gross modified independence and no AD. Pt was educated on precautions, brace application/wearing schedule, appropriate activity progression, and car transfer. Pt has met acute PT goals and we will sign off at this time. If needs change, please reconsult.     Follow Up Recommendations  No PT follow up;Supervision for mobility/OOB     Equipment Recommendations  None recommended by PT    Recommendations for Other Services       Precautions / Restrictions Precautions Precautions: Back Precaution Booklet Issued: Yes (comment) Precaution Comments: Reviewed handout and pt was cued for precautions during functional mobility. Required Braces or Orthoses: Spinal Brace Spinal Brace: Lumbar corset;Applied in sitting position Restrictions Weight Bearing Restrictions: No    Mobility  Bed Mobility Overal bed mobility: Modified Independent             General bed mobility comments: HOB flat and rails lowered to simulate home environment. No assist required and pt was able to transition to sitting without difficulty. Good log roll technique.    Transfers Overall transfer level: Modified independent Equipment used: None             General transfer comment: Good posture. No assist.  Ambulation/Gait Ambulation/Gait assistance: Modified independent (Device/Increase time) Gait Distance (Feet): 300 Feet Assistive device: None Gait Pattern/deviations: Step-through pattern;Decreased stride length;Trunk flexed Gait velocity:  Decreased Gait velocity interpretation: 1.31 - 2.62 ft/sec, indicative of limited community ambulator General Gait Details: Slow but steady. No assist required and no unsteadiness noted.   Stairs             Wheelchair Mobility    Modified Rankin (Stroke Patients Only)       Balance Overall balance assessment: No apparent balance deficits (not formally assessed)                                          Cognition Arousal/Alertness: Awake/alert Behavior During Therapy: WFL for tasks assessed/performed Overall Cognitive Status: Within Functional Limits for tasks assessed                                        Exercises      General Comments        Pertinent Vitals/Pain Pain Assessment: Faces Faces Pain Scale: Hurts a little bit Pain Location: Back Pain Descriptors / Indicators: Constant;Discomfort;Grimacing Pain Intervention(s): Limited activity within patient's tolerance;Monitored during session;Repositioned    Home Living                      Prior Function            PT Goals (current goals can now be found in the care plan section) Acute Rehab PT Goals Patient Stated Goal: Go home PT Goal Formulation: With patient Time For Goal Achievement: 01/26/21 Potential to Achieve Goals: Fair Progress towards PT goals:  Progressing toward goals    Frequency    Min 5X/week      PT Plan Current plan remains appropriate    Co-evaluation              AM-PAC PT "6 Clicks" Mobility   Outcome Measure  Help needed turning from your back to your side while in a flat bed without using bedrails?: None Help needed moving from lying on your back to sitting on the side of a flat bed without using bedrails?: None Help needed moving to and from a bed to a chair (including a wheelchair)?: None Help needed standing up from a chair using your arms (e.g., wheelchair or bedside chair)?: None Help needed to walk in hospital  room?: A Little Help needed climbing 3-5 steps with a railing? : A Little 6 Click Score: 22    End of Session Equipment Utilized During Treatment: Gait belt;Back brace Activity Tolerance: Patient tolerated treatment well Patient left: Other (comment) (Sitting EOB with OT present) Nurse Communication: Mobility status PT Visit Diagnosis: Unsteadiness on feet (R26.81);Pain Pain - part of body:  (abdomen at incision site)     Time: 0052-5910 PT Time Calculation (min) (ACUTE ONLY): 12 min  Charges:  $Gait Training: 8-22 mins                     Rolinda Roan, PT, DPT Acute Rehabilitation Services Pager: [phone] Office: [phone]    Thelma Comp 01/20/2021, 8:52 AM

## 2021-01-21 LAB — TYPE AND SCREEN
ABO/RH(D): O NEG
ABO/RH(D): O NEG
Antibody Screen: NEGATIVE
Antibody Screen: NEGATIVE
Unit division: 0
Unit division: 0
Unit division: 0
Unit division: 0

## 2021-01-21 LAB — BPAM RBC
Blood Product Expiration Date: 202204092359
Blood Product Expiration Date: 202204212359
Blood Product Expiration Date: 202204212359
Blood Product Expiration Date: 202204212359
ISSUE DATE / TIME: 202203280720
ISSUE DATE / TIME: 202203280720
Unit Type and Rh: 9500
Unit Type and Rh: 9500
Unit Type and Rh: 9500
Unit Type and Rh: 9500

## 2021-01-21 MED FILL — Sodium Chloride IV Soln 0.9%: INTRAVENOUS | Qty: 1000 | Status: AC

## 2021-01-21 MED FILL — Heparin Sodium (Porcine) Inj 1000 Unit/ML: INTRAMUSCULAR | Qty: 30 | Status: AC

## 2021-02-10 ENCOUNTER — Other Ambulatory Visit: Payer: Self-pay | Admitting: Urology

## 2021-02-10 DIAGNOSIS — R351 Nocturia: Secondary | ICD-10-CM

## 2021-02-10 DIAGNOSIS — N401 Enlarged prostate with lower urinary tract symptoms: Secondary | ICD-10-CM

## 2021-03-04 ENCOUNTER — Other Ambulatory Visit: Payer: Self-pay

## 2021-03-04 ENCOUNTER — Other Ambulatory Visit: Payer: Self-pay | Admitting: Student

## 2021-03-04 ENCOUNTER — Ambulatory Visit: Payer: Medicare PPO | Admitting: Family Medicine

## 2021-03-04 ENCOUNTER — Encounter: Payer: Self-pay | Admitting: Family Medicine

## 2021-03-04 VITALS — BP 130/87 | HR 72 | Ht 73.0 in | Wt 245.0 lb

## 2021-03-04 DIAGNOSIS — M1712 Unilateral primary osteoarthritis, left knee: Secondary | ICD-10-CM

## 2021-03-04 DIAGNOSIS — E7801 Familial hypercholesterolemia: Secondary | ICD-10-CM | POA: Diagnosis not present

## 2021-03-04 DIAGNOSIS — I38 Endocarditis, valve unspecified: Secondary | ICD-10-CM

## 2021-03-04 DIAGNOSIS — E876 Hypokalemia: Secondary | ICD-10-CM

## 2021-03-04 DIAGNOSIS — I10 Essential (primary) hypertension: Secondary | ICD-10-CM

## 2021-03-04 DIAGNOSIS — R601 Generalized edema: Secondary | ICD-10-CM | POA: Diagnosis not present

## 2021-03-04 DIAGNOSIS — K219 Gastro-esophageal reflux disease without esophagitis: Secondary | ICD-10-CM | POA: Diagnosis not present

## 2021-03-04 DIAGNOSIS — J302 Other seasonal allergic rhinitis: Secondary | ICD-10-CM

## 2021-03-04 DIAGNOSIS — M5136 Other intervertebral disc degeneration, lumbar region: Secondary | ICD-10-CM

## 2021-03-04 DIAGNOSIS — M5440 Lumbago with sciatica, unspecified side: Secondary | ICD-10-CM

## 2021-03-04 MED ORDER — HYDROCHLOROTHIAZIDE 12.5 MG PO TABS: 12.5000 mg | ORAL_TABLET | Freq: Every day | ORAL | 1 refills | Status: DC

## 2021-03-04 MED ORDER — POTASSIUM CHLORIDE CRYS ER 20 MEQ PO TBCR: 60.0000 meq | EXTENDED_RELEASE_TABLET | Freq: Every day | ORAL | 1 refills | Status: DC

## 2021-03-04 MED ORDER — METOPROLOL TARTRATE 50 MG PO TABS: 25.0000 mg | ORAL_TABLET | Freq: Two times a day (BID) | ORAL | 1 refills | Status: DC

## 2021-03-04 MED ORDER — LISINOPRIL 20 MG PO TABS: 20.0000 mg | ORAL_TABLET | Freq: Every evening | ORAL | 1 refills | Status: DC

## 2021-03-04 MED ORDER — PANTOPRAZOLE SODIUM 40 MG PO TBEC: 40.0000 mg | DELAYED_RELEASE_TABLET | Freq: Every day | ORAL | 1 refills | Status: DC

## 2021-03-04 MED ORDER — DILTIAZEM HCL ER COATED BEADS 120 MG PO CP24: 120.0000 mg | ORAL_CAPSULE | Freq: Two times a day (BID) | ORAL | 1 refills | Status: DC

## 2021-03-04 MED ORDER — MONTELUKAST SODIUM 10 MG PO TABS: 10.0000 mg | ORAL_TABLET | Freq: Every day | ORAL | 1 refills | Status: DC

## 2021-03-04 MED ORDER — CELECOXIB 200 MG PO CAPS: 200.0000 mg | ORAL_CAPSULE | Freq: Every day | ORAL | 5 refills | Status: DC

## 2021-03-04 MED ORDER — FEXOFENADINE HCL 180 MG PO TABS: 180.0000 mg | ORAL_TABLET | Freq: Every day | ORAL | 1 refills | Status: DC

## 2021-03-04 NOTE — Progress Notes (Signed)
Date:  03/04/2021   Name:  [name]   DOB:  [birth year]   MRN:  [id number]   Chief Complaint: Allergic Rhinitis , Hypertension, Gastroesophageal Reflux, hypokalemia, and Arthritis  Hypertension This is a chronic problem. The current episode started more than 1 year ago. The problem has been waxing and waning since onset. The problem is controlled. Pertinent negatives include no anxiety, blurred vision, chest pain, headaches, malaise/fatigue, neck pain, orthopnea, palpitations, peripheral edema, PND, shortness of breath or sweats. There are no associated agents to hypertension. Risk factors for coronary artery disease include dyslipidemia. Past treatments include ACE inhibitors, calcium channel blockers, diuretics and beta blockers. The current treatment provides moderate improvement. There are no compliance problems.  There is no history of angina, kidney disease, CAD/MI, CVA, heart failure, left ventricular hypertrophy, PVD or retinopathy. There is no history of chronic renal disease, a hypertension causing med or renovascular disease.  Gastroesophageal Reflux He reports no abdominal pain, no belching, no chest pain, no choking, no coughing, no dysphagia, no early satiety, no globus sensation, no hoarse voice, no nausea, no sore throat, no stridor or no wheezing. This is a chronic problem. The current episode started more than 1 year ago. The problem occurs occasionally. The problem has been gradually improving. Pertinent negatives include no anemia, melena or orthopnea. He has tried a PPI for the symptoms. The treatment provided moderate relief.  Arthritis Presents for follow-up visit. He reports no pain or joint swelling. The symptoms have been stable. Affected location: lumbar. Pertinent negatives include no diarrhea, dysuria, fever, pain at night or rash. Compliance problems include medication cost.     Lab Results  Component Value Date   CREATININE 1.04 01/14/2021   BUN 8  01/14/2021   NA 139 01/14/2021   K 3.6 01/14/2021   CL 101 01/14/2021   CO2 31 01/14/2021   Lab Results  Component Value Date   CHOL 184 04/06/2020   HDL 46 04/06/2020   LDLCALC 123 (H) 04/06/2020   TRIG 79 04/06/2020   CHOLHDL 4.1 08/07/2019   No results found for: TSH No results found for: HGBA1C Lab Results  Component Value Date   WBC 6.9 01/14/2021   HGB 12.9 (L) 01/14/2021   HCT 41.3 01/14/2021   MCV 75.4 (L) 01/14/2021   PLT 226 01/14/2021   Lab Results  Component Value Date   ALT 10 04/06/2020   AST 10 04/06/2020   ALKPHOS 147 (H) 04/06/2020   BILITOT 0.2 04/06/2020     Review of Systems  Constitutional: Negative for chills, fever and malaise/fatigue.  HENT: Negative for drooling, ear discharge, ear pain, hoarse voice and sore throat.   Eyes: Negative for blurred vision.  Respiratory: Negative for cough, choking, shortness of breath and wheezing.   Cardiovascular: Negative for chest pain, palpitations, orthopnea, leg swelling and PND.  Gastrointestinal: Negative for abdominal pain, blood in stool, constipation, diarrhea, dysphagia, melena and nausea.  Endocrine: Negative for polydipsia.  Genitourinary: Negative for dysuria, frequency, hematuria and urgency.  Musculoskeletal: Positive for arthritis. Negative for back pain, joint swelling, myalgias and neck pain.  Skin: Negative for rash.  Allergic/Immunologic: Negative for environmental allergies.  Neurological: Negative for dizziness and headaches.  Hematological: Does not bruise/bleed easily.  Psychiatric/Behavioral: Negative for suicidal ideas. The patient is not nervous/anxious.     Patient Active Problem List   Diagnosis Date Noted  . Spinal stenosis of lumbosacral region 01/18/2021  . Herniated nucleus pulposus, lumbar 03/16/2020  . Body mass  index (BMI) 33.0-33.9, adult 09/03/2019  . Other intervertebral disc displacement, lumbar region 06/25/2019  . HNP (herniated nucleus pulposus), lumbar  06/12/2019  . UTI due to Klebsiella species 05/08/2019  . Biceps tendonitis on right 07/27/2018  . Spinal stenosis, 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  . Lumbago with sciatica, unspecified side 11/14/2017  . Primary osteoarthritis of right knee 10/12/2017  . Taking multiple medications for chronic disease 07/30/2015  . Bronchitis 07/01/2015  . Reactive airway disease 07/01/2015  . Allergic sinusitis 07/01/2015  . Essential (primary) hypertension 07/01/2015  . Valvular heart disease 07/01/2015  . Metatarsalgia of left foot 06/10/2015  . [place] arthritis 12/15/2013    Allergies  Allergen Reactions  . Meloxicam Palpitations    Other reaction(s): arrythmia    Past Surgical History:  Procedure Laterality Date  . ABDOMINAL EXPOSURE N/A 01/18/2021   Procedure: ABDOMINAL EXPOSURE;  Surgeon: [name], MD;  Location: [facility];  Service: Vascular;  Laterality: N/A;  . ANTERIOR LUMBAR FUSION N/A 01/18/2021   Procedure: Anterior Lumbar Interbody Fusion - Lumbar Five-Sarcal One;  Surgeon: Kary Kos, MD;  Location: [facility];  Service: Neurosurgery;  Laterality: N/A;  Anterior Lumbar Interbody Fusion - Lumbar Five-Sacral One  . BACK SURGERY  2018  . COLONOSCOPY    . HERNIA REPAIR    . JOINT REPLACEMENT Left     x2 - knee  . LITHOTRIPSY    . [place] SURGERY  47/14/2020  . Lumbar disectomy  01/18/2019  . TRANSFORAMINAL LUMBAR INTERBODY FUSION (TLIF) WITH PEDICLE SCREW FIXATION 1 LEVEL N/A 06/12/2019   Procedure: LUMBAR FOUR-FIVE TRANSFORAMINAL LUMBAR INTERBODY FUSION (TLIF) WITH PEDICLE SCREW FIXATION;  Surgeon: Kary Kos, MD;  Location: [facility];  Service: Neurosurgery;  Laterality: N/A;    Social History   Tobacco Use  . Smoking status: Never Smoker  . Smokeless tobacco: Former Systems developer    Types: Chew  . Tobacco comment: Quit Fall 2020  Vaping Use  . Vaping Use: Never used  Substance Use Topics  . Alcohol use: Yes    Comment: ocassional  . Drug use: Never      Medication list has been reviewed and updated.  Current Meds  Medication Sig  . apixaban (ELIQUIS) 5 MG TABS tablet Take 5 mg by mouth 2 (two) times daily.  . celecoxib (CELEBREX) 200 MG capsule Take 1 capsule (200 mg total) by mouth daily.  Marland Kitchen diltiazem (CARDIZEM CD) 120 MG 24 hr capsule Take 1 capsule (120 mg total) by mouth in the morning and at bedtime.  . fexofenadine (ALLEGRA) 180 MG tablet Take 1 tablet by mouth daily (Patient taking differently: Take 180 mg by mouth every evening.)  . fluticasone (FLONASE) 50 MCG/ACT nasal spray Use 1 spray(s) in each nostril once daily (Patient taking differently: Place 1 spray into both nostrils daily as needed for allergies.)  . hydrochlorothiazide (HYDRODIURIL) 12.5 MG tablet Take 1 tablet (12.5 mg total) by mouth daily.  Marland Kitchen lisinopril (ZESTRIL) 20 MG tablet Take 1 tablet (20 mg total) by mouth every evening.  . methocarbamol (ROBAXIN) 500 MG tablet Take 1 tablet (500 mg total) by mouth 4 (four) times daily.  . metoprolol tartrate (LOPRESSOR) 50 MG tablet Take 0.5 tablets (25 mg total) by mouth 2 (two) times daily.  . montelukast (SINGULAIR) 10 MG tablet Take 1 tablet (10 mg total) by mouth at bedtime.  . Multiple Vitamins-Minerals (MULTIVITAMIN WITH MINERALS) tablet Take 1 tablet by mouth daily.  . Olopatadine HCl 0.2 % SOLN Place 1  drop into both eyes 2 (two) times daily as needed (allergies.).  Marland Kitchen pantoprazole (PROTONIX) 40 MG tablet Take 1 tablet (40 mg total) by mouth daily.  . Phenazopyridine HCl (AZO URINARY PAIN PO) Take by mouth.  . potassium chloride SA (KLOR-CON) 20 MEQ tablet Take 3 tablets (60 mEq total) by mouth daily. (Patient taking differently: Take 20-40 mEq by mouth See admin instructions. 40 meq in the morning, 20 meq in the evening)  . tadalafil (CIALIS) 5 MG tablet Take 1-2 tablets (5-10 mg total) by mouth daily as needed for erectile dysfunction.  . tamsulosin (FLOMAX) 0.4 MG CAPS capsule TAKE ONE (1) CAPSULE EACH DAY.     PHQ 2/9 Scores 03/04/2021 09/14/2020 09/14/2020 05/26/2020  PHQ - 2 Score 0 0 0 0  PHQ- 9 Score 0 - 0 0    GAD 7 : Generalized Anxiety Score 03/04/2021 09/14/2020 04/06/2020 09/03/2019  Nervous, Anxious, on Edge 0 0 0 0  Control/stop worrying 0 0 0 0  Worry too much - different things 0 0 0 0  Trouble relaxing 0 0 0 0  Restless 0 0 0 0  Easily annoyed or irritable 0 0 0 0  Afraid - awful might happen 0 0 0 0  Total GAD 7 Score 0 0 0 0    BP Readings from Last 3 Encounters:  03/04/21 (!) 130/92  01/20/21 123/80  01/14/21 (!) 161/101    Physical Exam Vitals and nursing note reviewed.  HENT:     Head: Normocephalic.     Right Ear: Tympanic membrane, ear canal and external ear normal.     Left Ear: Tympanic membrane, ear canal and external ear normal.     Nose: Nose normal. No congestion or rhinorrhea.     Mouth/Throat:     Mouth: Mucous membranes are moist.  Eyes:     General: No scleral icterus.       Right eye: No discharge.        Left eye: No discharge.     Conjunctiva/sclera: Conjunctivae normal.     Pupils: Pupils are equal, round, and reactive to light.  Neck:     Thyroid: No thyromegaly.     Vascular: No JVD.     Trachea: No tracheal deviation.  Cardiovascular:     Rate and Rhythm: Normal rate and regular rhythm.     Heart sounds: Normal heart sounds. No murmur heard. No friction rub. No gallop.   Pulmonary:     Effort: No respiratory distress.     Breath sounds: Normal breath sounds. No stridor. No wheezing, rhonchi or rales.  Abdominal:     General: Bowel sounds are normal.     Palpations: Abdomen is soft. There is no mass.     Tenderness: There is no abdominal tenderness. There is no guarding or rebound.  Musculoskeletal:        General: No tenderness. Normal range of motion.     Cervical back: Normal range of motion and neck supple.  Lymphadenopathy:     Cervical: No cervical adenopathy.  Skin:    General: Skin is warm.     Findings: No erythema or  rash.  Neurological:     Mental Status: He is alert and oriented to person, place, and time.     Cranial Nerves: No cranial nerve deficit.     Deep Tendon Reflexes: Reflexes are normal and symmetric.     Wt Readings from Last 3 Encounters:  03/04/21 245 lb (111.1 kg)  01/18/21  248 lb 14.4 oz (112.9 kg)  01/14/21 248 lb 14.4 oz (112.9 kg)    BP (!) 130/92   Pulse 72   Ht 6\' 1"  (1.854 m)   Wt 245 lb (111.1 kg)   BMI 32.32 kg/m   Assessment and Plan: 1. Primary osteoarthritis of left knee Chronic.  Controlled.  Stable.  For the most part has resolved with injection continue Celebrex 200 mg 1 a day. - celecoxib (CELEBREX) 200 MG capsule; Take 1 capsule (200 mg total) by mouth daily.  Dispense: 30 capsule; Refill: 5  2. Degenerative disc disease, lumbar Chronic.  Controlled.  Stable.  Followed by orthopedics but I refill his NSAID and we will continue with the Celebrex 200 mg once a day. - celecoxib (CELEBREX) 200 MG capsule; Take 1 capsule (200 mg total) by mouth daily.  Dispense: 30 capsule; Refill: 5  3. Essential hypertension Chronic.  Controlled.  Stable.  Blood pressure 130/87.  Patient has normal readings in the evenings.  We will continue current dosing as above for medications including hydrochlorothiazide 12.5 mg once a day, lisinopril 20 mg once a day, diltiazem CD1 20 mg once in the morning and at bedtime, and metoprolol 50 mg 1/2 tablet twice a day. - diltiazem (CARDIZEM CD) 120 MG 24 hr capsule; Take 1 capsule (120 mg total) by mouth in the morning and at bedtime.  Dispense: 180 capsule; Refill: 1 - hydrochlorothiazide (HYDRODIURIL) 12.5 MG tablet; Take 1 tablet (12.5 mg total) by mouth daily.  Dispense: 90 tablet; Refill: 1 - lisinopril (ZESTRIL) 20 MG tablet; Take 1 tablet (20 mg total) by mouth every evening.  Dispense: 90 tablet; Refill: 1  4. Other seasonal allergic rhinitis Chronic.  Controlled.  Stable.  Continue Allegra 180 mg once a day.   - fexofenadine  (ALLEGRA) 180 MG tablet; Take 1 tablet (180 mg total) by mouth daily.  Dispense: 90 tablet; Refill: 1  5. Generalized edema Chronic.  Controlled.  Stable.  Continue hydrochlorothiazide 12.5 mg once a day. - hydrochlorothiazide (HYDRODIURIL) 12.5 MG tablet; Take 1 tablet (12.5 mg total) by mouth daily.  Dispense: 90 tablet; Refill: 1  6. Valvular heart disease Chronic.  Controlled.  Stable.  Continue hydrochlorothiazide 12.5 mg once a day. - hydrochlorothiazide (HYDRODIURIL) 12.5 MG tablet; Take 1 tablet (12.5 mg total) by mouth daily.  Dispense: 90 tablet; Refill: 1  7. Seasonal allergic rhinitis, unspecified trigger As noted previous - montelukast (SINGULAIR) 10 MG tablet; Take 1 tablet (10 mg total) by mouth at bedtime.  Dispense: 90 tablet; Refill: 1  8. Gastroesophageal reflux disease, unspecified whether esophagitis present Chronic.  Controlled.  Stable.  Continue Protonix 40 mg once a day. - pantoprazole (PROTONIX) 40 MG tablet; Take 1 tablet (40 mg total) by mouth daily.  Dispense: 90 tablet; Refill: 1  9. Hypokalemia Chronic.  Controlled.  Stable.  Continue potassium SA 20 mg 3 tablets daily. - potassium chloride SA (KLOR-CON) 20 MEQ tablet; Take 3 tablets (60 mEq total) by mouth daily.  Dispense: 270 tablet; Refill: 1  10. Familial hypercholesterolemia Chronic.  Controlled.  Stable.  Continue with current diet control however we will check lipid panel and give some consideration of Zetia. - Lipid Panel With LDL/HDL Ratio

## 2021-03-05 LAB — LIPID PANEL WITH LDL/HDL RATIO
Cholesterol, Total: 180 mg/dL (ref 100–199)
HDL: 46 mg/dL (ref >39)
LDL Chol Calc (NIH): 119 mg/dL — ABNORMAL HIGH (ref 0–99)
LDL/HDL Ratio: 2.6 ratio (ref 0.0–3.6)
Triglycerides: 80 mg/dL (ref 0–149)
VLDL Cholesterol Cal: 15 mg/dL (ref 5–40)

## 2021-03-11 ENCOUNTER — Other Ambulatory Visit: Payer: Self-pay

## 2021-03-11 ENCOUNTER — Ambulatory Visit
Admission: RE | Admit: 2021-03-11 | Discharge: 2021-03-11 | Disposition: A | Discharge status: Home / Self Care | Payer: Medicare PPO | Source: Ambulatory Visit | Attending: Student | Admitting: Student

## 2021-03-11 DIAGNOSIS — M5440 Lumbago with sciatica, unspecified side: Secondary | ICD-10-CM | POA: Insufficient documentation

## 2021-03-11 DIAGNOSIS — M545 Low back pain, unspecified: Secondary | ICD-10-CM | POA: Diagnosis not present

## 2021-03-11 MED ORDER — GADOBUTROL 1 MMOL/ML IV SOLN
10.0000 mL | Freq: Once | INTRAVENOUS | Status: AC | PRN
  Administered 2021-03-11: 10 mL via INTRAVENOUS

## 2021-03-12 DIAGNOSIS — I10 Essential (primary) hypertension: Secondary | ICD-10-CM | POA: Diagnosis not present

## 2021-03-12 DIAGNOSIS — M5440 Lumbago with sciatica, unspecified side: Secondary | ICD-10-CM | POA: Diagnosis not present

## 2021-03-12 DIAGNOSIS — Z6832 Body mass index (BMI) 32.0-32.9, adult: Secondary | ICD-10-CM | POA: Diagnosis not present

## 2021-03-15 ENCOUNTER — Encounter: Payer: Self-pay | Admitting: Family Medicine

## 2021-03-15 ENCOUNTER — Ambulatory Visit (HOSPITAL_COMMUNITY)
Admission: RE | Admit: 2021-03-15 | Discharge: 2021-03-15 | Disposition: A | Discharge status: Home / Self Care | Payer: Medicare PPO | Source: Ambulatory Visit | Attending: Neurosurgery | Admitting: Neurosurgery

## 2021-03-15 ENCOUNTER — Other Ambulatory Visit (HOSPITAL_BASED_OUTPATIENT_CLINIC_OR_DEPARTMENT_OTHER): Payer: Self-pay | Admitting: Neurosurgery

## 2021-03-15 ENCOUNTER — Other Ambulatory Visit: Payer: Self-pay

## 2021-03-15 ENCOUNTER — Other Ambulatory Visit: Payer: Self-pay | Admitting: Neurosurgery

## 2021-03-15 ENCOUNTER — Ambulatory Visit: Payer: Medicare PPO | Admitting: Family Medicine

## 2021-03-15 VITALS — BP 138/88 | HR 64 | Ht 73.0 in | Wt 245.0 lb

## 2021-03-15 DIAGNOSIS — N1339 Other hydronephrosis: Secondary | ICD-10-CM

## 2021-03-15 DIAGNOSIS — N400 Enlarged prostate without lower urinary tract symptoms: Secondary | ICD-10-CM | POA: Diagnosis not present

## 2021-03-15 DIAGNOSIS — J0100 Acute maxillary sinusitis, unspecified: Secondary | ICD-10-CM | POA: Diagnosis not present

## 2021-03-15 DIAGNOSIS — N138 Other obstructive and reflux uropathy: Secondary | ICD-10-CM | POA: Diagnosis not present

## 2021-03-15 DIAGNOSIS — N132 Hydronephrosis with renal and ureteral calculous obstruction: Secondary | ICD-10-CM | POA: Diagnosis not present

## 2021-03-15 DIAGNOSIS — R059 Cough, unspecified: Secondary | ICD-10-CM | POA: Diagnosis not present

## 2021-03-15 DIAGNOSIS — K4020 Bilateral inguinal hernia, without obstruction or gangrene, not specified as recurrent: Secondary | ICD-10-CM | POA: Diagnosis not present

## 2021-03-15 LAB — POCT I-STAT CREATININE: Creatinine, Ser: 1 mg/dL (ref 0.61–1.24)

## 2021-03-15 MED ORDER — SODIUM CHLORIDE (PF) 0.9 % IJ SOLN
INTRAMUSCULAR | Status: AC
  Filled 2021-03-15: qty 50

## 2021-03-15 MED ORDER — IOHEXOL 300 MG/ML  SOLN
100.0000 mL | Freq: Once | INTRAMUSCULAR | Status: AC | PRN
  Administered 2021-03-15: 100 mL via INTRAVENOUS

## 2021-03-15 MED ORDER — GUAIFENESIN-CODEINE 100-10 MG/5ML PO SYRP: 5.0000 mL | ORAL_SOLUTION | Freq: Four times a day (QID) | ORAL | 0 refills | Status: DC | PRN

## 2021-03-15 MED ORDER — AMOXICILLIN 500 MG PO CAPS: 500.0000 mg | ORAL_CAPSULE | Freq: Three times a day (TID) | ORAL | 0 refills | Status: AC

## 2021-03-15 NOTE — Progress Notes (Signed)
Date:  03/15/2021   Name:  [name]   DOB:  09-May-1956   MRN:  [id number]   Chief Complaint: Sinusitis (Cough, congestion, drainage- light green production. Taking mucinex and Tylenol x 3 days)  Sinusitis This is a new problem. The current episode started in the past 7 days. The problem has been waxing and waning since onset. There has been no fever. The pain is moderate. Associated symptoms include congestion, coughing, a hoarse voice, sinus pressure and a sore throat. Pertinent negatives include no chills, diaphoresis, ear pain, headaches, neck pain, shortness of breath, sneezing or swollen glands. Past treatments include oral decongestants. The treatment provided mild relief.    Lab Results  Component Value Date   CREATININE 1.00 03/15/2021   BUN 8 01/14/2021   NA 139 01/14/2021   K 3.6 01/14/2021   CL 101 01/14/2021   CO2 31 01/14/2021   Lab Results  Component Value Date   CHOL 180 03/04/2021   HDL 46 03/04/2021   LDLCALC 119 (H) 03/04/2021   TRIG 80 03/04/2021   CHOLHDL 4.1 08/07/2019   No results found for: TSH No results found for: HGBA1C Lab Results  Component Value Date   WBC 6.9 01/14/2021   HGB 12.9 (L) 01/14/2021   HCT 41.3 01/14/2021   MCV 75.4 (L) 01/14/2021   PLT 226 01/14/2021   Lab Results  Component Value Date   ALT 10 04/06/2020   AST 10 04/06/2020   ALKPHOS 147 (H) 04/06/2020   BILITOT 0.2 04/06/2020     Review of Systems  Constitutional: Negative for chills, diaphoresis and fever.  HENT: Positive for congestion, hoarse voice, sinus pressure and sore throat. Negative for drooling, ear discharge, ear pain and sneezing.   Respiratory: Positive for cough. Negative for shortness of breath and wheezing.   Cardiovascular: Negative for chest pain, palpitations and leg swelling.  Gastrointestinal: Negative for abdominal pain, blood in stool, constipation, diarrhea and nausea.  Endocrine: Negative for polydipsia.  Genitourinary: Negative  for dysuria, frequency, hematuria and urgency.  Musculoskeletal: Negative for back pain, myalgias and neck pain.  Skin: Negative for rash.  Allergic/Immunologic: Negative for environmental allergies.  Neurological: Negative for dizziness and headaches.  Hematological: Does not bruise/bleed easily.  Psychiatric/Behavioral: Negative for suicidal ideas. The patient is not nervous/anxious.     Patient Active Problem List   Diagnosis Date Noted  . Spinal stenosis of lumbosacral region 01/18/2021  . Herniated nucleus pulposus, lumbar 03/16/2020  . Body mass index (BMI) 33.0-33.9, adult 09/03/2019  . Other intervertebral disc displacement, lumbar region 06/25/2019  . HNP (herniated nucleus pulposus), lumbar 06/12/2019  . UTI due to Klebsiella species 05/08/2019  . Biceps tendonitis on right 07/27/2018  . Spinal stenosis, 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  . Lumbago with sciatica, unspecified side 11/14/2017  . Primary osteoarthritis of right knee 10/12/2017  . Taking multiple medications for chronic disease 07/30/2015  . Bronchitis 07/01/2015  . Reactive airway disease 07/01/2015  . Allergic sinusitis 07/01/2015  . Essential (primary) hypertension 07/01/2015  . Valvular heart disease 07/01/2015  . Metatarsalgia of left foot 06/10/2015  . [place] arthritis 12/15/2013    Allergies  Allergen Reactions  . Meloxicam Palpitations    Other reaction(s): arrythmia    Past Surgical History:  Procedure Laterality Date  . ABDOMINAL EXPOSURE N/A 01/18/2021   Procedure: ABDOMINAL EXPOSURE;  Surgeon: [name], MD;  Location: [facility];  Service: Vascular;  Laterality: N/A;  . ANTERIOR LUMBAR FUSION N/A 01/18/2021  Procedure: Anterior Lumbar Interbody Fusion - Lumbar Five-Sarcal One;  Surgeon: Kary Kos, MD;  Location: [facility];  Service: Neurosurgery;  Laterality: N/A;  Anterior Lumbar Interbody Fusion - Lumbar Five-Sacral One  . BACK SURGERY  2018  . COLONOSCOPY    .  HERNIA REPAIR    . JOINT REPLACEMENT Left     x2 - knee  . LITHOTRIPSY    . [place] SURGERY  47/14/2020  . Lumbar disectomy  01/18/2019  . TRANSFORAMINAL LUMBAR INTERBODY FUSION (TLIF) WITH PEDICLE SCREW FIXATION 1 LEVEL N/A 06/12/2019   Procedure: LUMBAR FOUR-FIVE TRANSFORAMINAL LUMBAR INTERBODY FUSION (TLIF) WITH PEDICLE SCREW FIXATION;  Surgeon: Kary Kos, MD;  Location: [facility];  Service: Neurosurgery;  Laterality: N/A;    Social History   Tobacco Use  . Smoking status: Never Smoker  . Smokeless tobacco: Former Systems developer    Types: Chew  . Tobacco comment: Quit Fall 2020  Vaping Use  . Vaping Use: Never used  Substance Use Topics  . Alcohol use: Yes    Comment: ocassional  . Drug use: Never     Medication list has been reviewed and updated.  Current Meds  Medication Sig  . apixaban (ELIQUIS) 5 MG TABS tablet Take 5 mg by mouth 2 (two) times daily.  . celecoxib (CELEBREX) 200 MG capsule Take 1 capsule (200 mg total) by mouth daily.  Marland Kitchen diltiazem (CARDIZEM CD) 120 MG 24 hr capsule Take 1 capsule (120 mg total) by mouth in the morning and at bedtime.  . fexofenadine (ALLEGRA) 180 MG tablet Take 1 tablet (180 mg total) by mouth daily.  . fluticasone (FLONASE) 50 MCG/ACT nasal spray Use 1 spray(s) in each nostril once daily (Patient taking differently: Place 1 spray into both nostrils daily as needed for allergies.)  . hydrochlorothiazide (HYDRODIURIL) 12.5 MG tablet Take 1 tablet (12.5 mg total) by mouth daily.  Marland Kitchen lisinopril (ZESTRIL) 20 MG tablet Take 1 tablet (20 mg total) by mouth every evening.  . methocarbamol (ROBAXIN) 500 MG tablet Take 1 tablet (500 mg total) by mouth 4 (four) times daily.  . metoprolol tartrate (LOPRESSOR) 50 MG tablet Take 0.5 tablets (25 mg total) by mouth 2 (two) times daily.  . montelukast (SINGULAIR) 10 MG tablet Take 1 tablet (10 mg total) by mouth at bedtime.  . Multiple Vitamins-Minerals (MULTIVITAMIN WITH MINERALS) tablet Take 1 tablet by mouth  daily.  . Olopatadine HCl 0.2 % SOLN Place 1 drop into both eyes 2 (two) times daily as needed (allergies.).  Marland Kitchen pantoprazole (PROTONIX) 40 MG tablet Take 1 tablet (40 mg total) by mouth daily.  . Phenazopyridine HCl (AZO URINARY PAIN PO) Take by mouth.  . potassium chloride SA (KLOR-CON) 20 MEQ tablet Take 3 tablets (60 mEq total) by mouth daily.  . tadalafil (CIALIS) 5 MG tablet Take 1-2 tablets (5-10 mg total) by mouth daily as needed for erectile dysfunction.  . tamsulosin (FLOMAX) 0.4 MG CAPS capsule TAKE ONE (1) CAPSULE EACH DAY.    PHQ 2/9 Scores 03/04/2021 09/14/2020 09/14/2020 05/26/2020  PHQ - 2 Score 0 0 0 0  PHQ- 9 Score 0 - 0 0    GAD 7 : Generalized Anxiety Score 03/04/2021 09/14/2020 04/06/2020 09/03/2019  Nervous, Anxious, on Edge 0 0 0 0  Control/stop worrying 0 0 0 0  Worry too much - different things 0 0 0 0  Trouble relaxing 0 0 0 0  Restless 0 0 0 0  Easily annoyed or irritable 0 0 0 0  Afraid - awful might happen 0 0 0 0  Total GAD 7 Score 0 0 0 0    BP Readings from Last 3 Encounters:  03/15/21 138/88  03/04/21 130/87  01/20/21 123/80    Physical Exam Vitals and nursing note reviewed.  HENT:     Head: Normocephalic.     Right Ear: Tympanic membrane, ear canal and external ear normal.     Left Ear: Tympanic membrane, ear canal and external ear normal.     Nose: Nose normal. No congestion or rhinorrhea.     Mouth/Throat:     Mouth: Mucous membranes are moist.  Eyes:     General: No scleral icterus.       Right eye: No discharge.        Left eye: No discharge.     Conjunctiva/sclera: Conjunctivae normal.     Pupils: Pupils are equal, round, and reactive to light.  Neck:     Thyroid: No thyromegaly.     Vascular: No JVD.     Trachea: No tracheal deviation.  Cardiovascular:     Rate and Rhythm: Normal rate and regular rhythm.     Heart sounds: Normal heart sounds. No murmur heard. No friction rub. No gallop.   Pulmonary:     Effort: No respiratory  distress.     Breath sounds: Normal breath sounds. No wheezing, rhonchi or rales.  Abdominal:     General: Bowel sounds are normal.     Palpations: Abdomen is soft. There is no mass.     Tenderness: There is no abdominal tenderness. There is no guarding or rebound.  Musculoskeletal:        General: No tenderness. Normal range of motion.     Cervical back: Normal range of motion and neck supple.  Lymphadenopathy:     Cervical: Cervical adenopathy present.  Skin:    General: Skin is warm.     Findings: No rash.  Neurological:     Mental Status: He is alert and oriented to person, place, and time.     Cranial Nerves: No cranial nerve deficit.     Deep Tendon Reflexes: Reflexes are normal and symmetric.     Wt Readings from Last 3 Encounters:  03/15/21 245 lb (111.1 kg)  03/04/21 245 lb (111.1 kg)  01/18/21 248 lb 14.4 oz (112.9 kg)    BP 138/88   Pulse 64   Ht 6\' 1"  (1.854 m)   Wt 245 lb (111.1 kg)   BMI 32.32 kg/m   Assessment and Plan:  1. Acute maxillary sinusitis, recurrence not specified New onset.  Persistent.  Stable.  We will treat with amoxicillin 500 mg 3 times a day for 10 days. - amoxicillin (AMOXIL) 500 MG capsule; Take 1 capsule (500 mg total) by mouth 3 (three) times daily for 10 days.  Dispense: 30 capsule; Refill: 0  2. Cough New onset cough.  This is been persistent for the past several days and is bothersome at night and we will treat with Robitussin-AC daily at bedtime 1 teaspoon and may be used during the day when not driving. - guaiFENesin-codeine (ROBITUSSIN AC) 100-10 MG/5ML syrup; Take 5 mLs by mouth 4 (four) times daily as needed for cough.  Dispense: 118 mL; Refill: 0

## 2021-03-16 ENCOUNTER — Telehealth: Payer: Self-pay

## 2021-03-16 DIAGNOSIS — M5440 Lumbago with sciatica, unspecified side: Secondary | ICD-10-CM | POA: Diagnosis not present

## 2021-03-16 NOTE — Telephone Encounter (Signed)
Called with continue Amoxicillin- pt was concerned with CT scan showing atelectasis OR mild pneumonia

## 2021-03-16 NOTE — Telephone Encounter (Signed)
Copied from [place] [id number]. Topic: General - Inquiry >> Mar 16, 2021  9:10 AM Oneta Rack wrote: Patient requesting to speak with Baxter Flattery, there's a note on regarding  pneumonia that he would like PCP to be made aware of regarding dx, patient would like a follow up call to discuss

## 2021-03-23 DIAGNOSIS — N200 Calculus of kidney: Secondary | ICD-10-CM | POA: Diagnosis not present

## 2021-03-23 DIAGNOSIS — R972 Elevated prostate specific antigen [PSA]: Secondary | ICD-10-CM | POA: Diagnosis not present

## 2021-03-23 DIAGNOSIS — N135 Crossing vessel and stricture of ureter without hydronephrosis: Secondary | ICD-10-CM | POA: Diagnosis not present

## 2021-03-26 DIAGNOSIS — L578 Other skin changes due to chronic exposure to nonionizing radiation: Secondary | ICD-10-CM | POA: Diagnosis not present

## 2021-03-26 DIAGNOSIS — Z859 Personal history of malignant neoplasm, unspecified: Secondary | ICD-10-CM | POA: Diagnosis not present

## 2021-03-26 DIAGNOSIS — L821 Other seborrheic keratosis: Secondary | ICD-10-CM | POA: Diagnosis not present

## 2021-03-26 DIAGNOSIS — Z872 Personal history of diseases of the skin and subcutaneous tissue: Secondary | ICD-10-CM | POA: Diagnosis not present

## 2021-03-26 DIAGNOSIS — Z85828 Personal history of other malignant neoplasm of skin: Secondary | ICD-10-CM | POA: Diagnosis not present

## 2021-03-26 DIAGNOSIS — L570 Actinic keratosis: Secondary | ICD-10-CM | POA: Diagnosis not present

## 2021-03-26 DIAGNOSIS — L853 Xerosis cutis: Secondary | ICD-10-CM | POA: Diagnosis not present

## 2021-03-30 ENCOUNTER — Other Ambulatory Visit: Payer: Self-pay | Admitting: Urology

## 2021-04-20 ENCOUNTER — Other Ambulatory Visit: Payer: Self-pay

## 2021-04-20 ENCOUNTER — Encounter (HOSPITAL_BASED_OUTPATIENT_CLINIC_OR_DEPARTMENT_OTHER): Payer: Self-pay | Admitting: Urology

## 2021-04-20 NOTE — Progress Notes (Signed)
Spoke w/ via phone for pre-op interview--- Pt Lab needs dos----  Istat            Lab results------ current ekg in epic/ chart COVID test -----patient states asymptomatic no test needed Arrive at -------  1145 on 04-21-2021 NPO after MN NO Solid Food.  Clear liquids from MN until--- 1045 Med rec completed Medications to take morning of surgery ----- Allegra, Lopressor, Cardizem, Flomax Diabetic medication ----- n/a Patient instructed no nail polish to be worn day of surgery Patient instructed to bring photo id and insurance card day of surgery Patient aware to have Driver (ride ) / caregiver    for 24 hours after surgery -- wife, [place] Patient Special Instructions ----- n/a Pre-Op special Istructions ----- received clearance note from pt's cardiologist via fax from Dr Tresa Moore office, place in chart Patient verbalized understanding of instructions that were given at this phone interview. Patient denies shortness of breath, chest pain, fever, cough at this phone interview.    Anesthesia :  HTN; PAF;  pt denies any cardiac s&s, sob, and no peripheral swelling.  PCP:  Dr Coralie Common (lov 03-15-2021 epic) Cardiologist : Dr Nehemiah Massed (lov 09-29-2020 epic) Chest x-ray : 12-03-2018 epic EKG : 01-18-2021 Echo : per pt had echo many yrs ago prior to dx PAF Stress test:  ETT 03-16-2017 result not available in care everywhere however per cardiology note this was normal  Cardiac Cath :  no Activity level: denies sob w/ any activity Sleep Study/ CPAP : NO Blood Thinner/ Instructions Maryjane Hurter Dose: Eliquis ASA / Instructions/ Last Dose :  no Per pt was given instructions from Dr Tresa Moore office to stop prior to surgery, stated last dose pm of 04-18-2021.

## 2021-04-21 ENCOUNTER — Ambulatory Visit (HOSPITAL_BASED_OUTPATIENT_CLINIC_OR_DEPARTMENT_OTHER): Payer: Medicare PPO | Admitting: Anesthesiology

## 2021-04-21 ENCOUNTER — Ambulatory Visit (HOSPITAL_BASED_OUTPATIENT_CLINIC_OR_DEPARTMENT_OTHER)
Admission: RE | Admit: 2021-04-21 | Discharge: 2021-04-21 | Disposition: A | Discharge status: Home / Self Care | Payer: Medicare PPO | Attending: Urology | Admitting: Urology

## 2021-04-21 ENCOUNTER — Encounter (HOSPITAL_BASED_OUTPATIENT_CLINIC_OR_DEPARTMENT_OTHER)
Admission: RE | Disposition: A | Discharge status: Home / Self Care | Payer: Self-pay | Source: Home / Self Care | Attending: Urology

## 2021-04-21 ENCOUNTER — Encounter (HOSPITAL_BASED_OUTPATIENT_CLINIC_OR_DEPARTMENT_OTHER): Payer: Self-pay | Admitting: Urology

## 2021-04-21 DIAGNOSIS — Z888 Allergy status to other drugs, medicaments and biological substances status: Secondary | ICD-10-CM | POA: Insufficient documentation

## 2021-04-21 DIAGNOSIS — Z87891 Personal history of nicotine dependence: Secondary | ICD-10-CM | POA: Diagnosis not present

## 2021-04-21 DIAGNOSIS — Z87442 Personal history of urinary calculi: Secondary | ICD-10-CM | POA: Diagnosis not present

## 2021-04-21 DIAGNOSIS — N2889 Other specified disorders of kidney and ureter: Secondary | ICD-10-CM | POA: Insufficient documentation

## 2021-04-21 DIAGNOSIS — Z7901 Long term (current) use of anticoagulants: Secondary | ICD-10-CM | POA: Insufficient documentation

## 2021-04-21 DIAGNOSIS — Z809 Family history of malignant neoplasm, unspecified: Secondary | ICD-10-CM | POA: Insufficient documentation

## 2021-04-21 DIAGNOSIS — Z8349 Family history of other endocrine, nutritional and metabolic diseases: Secondary | ICD-10-CM | POA: Insufficient documentation

## 2021-04-21 DIAGNOSIS — K219 Gastro-esophageal reflux disease without esophagitis: Secondary | ICD-10-CM | POA: Diagnosis not present

## 2021-04-21 DIAGNOSIS — I480 Paroxysmal atrial fibrillation: Secondary | ICD-10-CM | POA: Insufficient documentation

## 2021-04-21 DIAGNOSIS — N1330 Unspecified hydronephrosis: Secondary | ICD-10-CM | POA: Insufficient documentation

## 2021-04-21 DIAGNOSIS — I10 Essential (primary) hypertension: Secondary | ICD-10-CM | POA: Diagnosis not present

## 2021-04-21 DIAGNOSIS — M48062 Spinal stenosis, lumbar region with neurogenic claudication: Secondary | ICD-10-CM | POA: Diagnosis not present

## 2021-04-21 HISTORY — DX: Other intervertebral disc degeneration, lumbosacral region: M51.37

## 2021-04-21 HISTORY — DX: Other chronic pain: G89.29

## 2021-04-21 HISTORY — DX: Gastro-esophageal reflux disease without esophagitis: K21.9

## 2021-04-21 HISTORY — DX: Benign prostatic hyperplasia with lower urinary tract symptoms: N40.1

## 2021-04-21 HISTORY — DX: Other intervertebral disc degeneration, lumbosacral region without mention of lumbar back pain or lower extremity pain: M51.379

## 2021-04-21 HISTORY — DX: Unspecified osteoarthritis, unspecified site: M19.90

## 2021-04-21 HISTORY — DX: Unspecified hydronephrosis: N13.30

## 2021-04-21 HISTORY — DX: Low back pain, unspecified: M54.50

## 2021-04-21 HISTORY — DX: Essential (primary) hypertension: I10

## 2021-04-21 HISTORY — DX: Other seasonal allergic rhinitis: J30.2

## 2021-04-21 HISTORY — DX: Personal history of other malignant neoplasm of skin: Z85.828

## 2021-04-21 HISTORY — DX: Unspecified hearing loss, right ear: H91.91

## 2021-04-21 HISTORY — DX: Mixed hyperlipidemia: E78.2

## 2021-04-21 HISTORY — PX: CYSTOSCOPY WITH RETROGRADE PYELOGRAM, URETEROSCOPY AND STENT PLACEMENT: SHX5789

## 2021-04-21 HISTORY — DX: Long term (current) use of anticoagulants: Z79.01

## 2021-04-21 HISTORY — DX: Spinal stenosis, lumbosacral region: M48.07

## 2021-04-21 LAB — POCT I-STAT, CHEM 8
BUN: 14 mg/dL (ref 8–23)
Calcium, Ion: 1.14 mmol/L — ABNORMAL LOW (ref 1.15–1.40)
Chloride: 105 mmol/L (ref 98–111)
Creatinine, Ser: 1 mg/dL (ref 0.61–1.24)
Glucose, Bld: 104 mg/dL — ABNORMAL HIGH (ref 70–99)
HCT: 38 % — ABNORMAL LOW (ref 39.0–52.0)
Hemoglobin: 12.9 g/dL — ABNORMAL LOW (ref 13.0–17.0)
Potassium: 3.4 mmol/L — ABNORMAL LOW (ref 3.5–5.1)
Sodium: 142 mmol/L (ref 135–145)
TCO2: 25 mmol/L (ref 22–32)

## 2021-04-21 SURGERY — CYSTOURETEROSCOPY, WITH RETROGRADE PYELOGRAM AND STENT INSERTION
Anesthesia: General | Site: Ureter | Laterality: Left

## 2021-04-21 MED ORDER — DEXAMETHASONE SODIUM PHOSPHATE 4 MG/ML IJ SOLN
INTRAMUSCULAR | Status: DC | PRN
  Administered 2021-04-21: 4 mg via INTRAVENOUS

## 2021-04-21 MED ORDER — SENNOSIDES-DOCUSATE SODIUM 8.6-50 MG PO TABS: 1.0000 | ORAL_TABLET | Freq: Two times a day (BID) | ORAL | 0 refills | Status: DC

## 2021-04-21 MED ORDER — LACTATED RINGERS IV SOLN: INTRAVENOUS | Status: DC

## 2021-04-21 MED ORDER — CEFAZOLIN SODIUM-DEXTROSE 2-4 GM/100ML-% IV SOLN
INTRAVENOUS | Status: AC
  Filled 2021-04-21: qty 100

## 2021-04-21 MED ORDER — 0.9 % SODIUM CHLORIDE (POUR BTL) OPTIME
TOPICAL | Status: DC | PRN
  Administered 2021-04-21: 500 mL

## 2021-04-21 MED ORDER — SODIUM CHLORIDE 0.9 % IR SOLN
Status: DC | PRN
  Administered 2021-04-21: 3000 mL via INTRAVESICAL

## 2021-04-21 MED ORDER — FENTANYL CITRATE (PF) 100 MCG/2ML IJ SOLN
INTRAMUSCULAR | Status: DC | PRN
  Administered 2021-04-21: 25 ug via INTRAVENOUS
  Administered 2021-04-21: 50 ug via INTRAVENOUS

## 2021-04-21 MED ORDER — ACETAMINOPHEN 500 MG PO TABS: 1000.0000 mg | ORAL_TABLET | Freq: Once | ORAL | Status: DC

## 2021-04-21 MED ORDER — FENTANYL CITRATE (PF) 100 MCG/2ML IJ SOLN
INTRAMUSCULAR | Status: AC
  Filled 2021-04-21: qty 2

## 2021-04-21 MED ORDER — FENTANYL CITRATE (PF) 100 MCG/2ML IJ SOLN: 25.0000 ug | INTRAMUSCULAR | Status: DC | PRN

## 2021-04-21 MED ORDER — LIDOCAINE HCL (CARDIAC) PF 100 MG/5ML IV SOSY
PREFILLED_SYRINGE | INTRAVENOUS | Status: DC | PRN
  Administered 2021-04-21: 50 mg via INTRAVENOUS

## 2021-04-21 MED ORDER — CEFAZOLIN SODIUM-DEXTROSE 2-4 GM/100ML-% IV SOLN
2.0000 g | INTRAVENOUS | Status: AC
  Administered 2021-04-21: 2 g via INTRAVENOUS

## 2021-04-21 MED ORDER — IOHEXOL 300 MG/ML  SOLN
INTRAMUSCULAR | Status: DC | PRN
  Administered 2021-04-21: 9 mL via URETHRAL

## 2021-04-21 MED ORDER — ONDANSETRON HCL 4 MG/2ML IJ SOLN
INTRAMUSCULAR | Status: DC | PRN
  Administered 2021-04-21: 4 mg via INTRAVENOUS

## 2021-04-21 MED ORDER — OXYCODONE-ACETAMINOPHEN 5-325 MG PO TABS: 1.0000 | ORAL_TABLET | Freq: Four times a day (QID) | ORAL | 0 refills | Status: DC | PRN

## 2021-04-21 MED ORDER — PROPOFOL 10 MG/ML IV BOLUS
INTRAVENOUS | Status: DC | PRN
  Administered 2021-04-21: 150 mg via INTRAVENOUS

## 2021-04-21 MED ORDER — PROPOFOL 10 MG/ML IV BOLUS
INTRAVENOUS | Status: AC
  Filled 2021-04-21: qty 20

## 2021-04-21 SURGICAL SUPPLY — 27 items
BAG DRAIN URO-CYSTO SKYTR STRL (DRAIN) ×2 IMPLANT
BAG DRN UROCATH (DRAIN) ×1
BASKET LASER NITINOL 1.9FR (BASKET) IMPLANT
BSKT STON RTRVL 120 1.9FR (BASKET)
CATH INTERMIT  6FR 70CM (CATHETERS) ×2 IMPLANT
CLOTH BEACON ORANGE TIMEOUT ST (SAFETY) ×2 IMPLANT
FIBER LASER FLEXIVA 365 (UROLOGICAL SUPPLIES) IMPLANT
GLOVE SURG ENC MOIS LTX SZ7.5 (GLOVE) ×2 IMPLANT
GLOVE SURG UNDER POLY LF SZ7 (GLOVE) ×4 IMPLANT
GOWN STRL REUS W/TWL LRG LVL3 (GOWN DISPOSABLE) ×6 IMPLANT
GUIDEWIRE ANG ZIPWIRE 038X150 (WIRE) ×2 IMPLANT
GUIDEWIRE STR DUAL SENSOR (WIRE) ×2 IMPLANT
INFUSOR MANOMETER BAG 3000ML (MISCELLANEOUS) ×2 IMPLANT
IV NS 1000ML (IV SOLUTION)
IV NS 1000ML BAXH (IV SOLUTION) IMPLANT
IV NS IRRIG 3000ML ARTHROMATIC (IV SOLUTION) ×2 IMPLANT
KIT TURNOVER CYSTO (KITS) ×2 IMPLANT
MANIFOLD NEPTUNE II (INSTRUMENTS) ×2 IMPLANT
NS IRRIG 500ML POUR BTL (IV SOLUTION) ×2 IMPLANT
PACK CYSTO (CUSTOM PROCEDURE TRAY) ×2 IMPLANT
STENT POLARIS 5FRX26 (STENTS) ×2 IMPLANT
SYR 10ML LL (SYRINGE) IMPLANT
TRACTIP FLEXIVA PULS ID 200XHI (Laser) IMPLANT
TRACTIP FLEXIVA PULSE ID 200 (Laser)
TUBE CONNECTING 12X1/4 (SUCTIONS) ×2 IMPLANT
TUBE FEEDING 8FR 16IN STR KANG (MISCELLANEOUS) ×2 IMPLANT
TUBING UROLOGY SET (TUBING) IMPLANT

## 2021-04-21 NOTE — Brief Op Note (Signed)
04/21/2021  2:12 PM  PATIENT:  [name]  65 y.o. male  PRE-OPERATIVE DIAGNOSIS:  LEFT HYDRONEPHROSIS  POST-OPERATIVE DIAGNOSIS:  LEFT HYDRONEPHROSIS  PROCEDURE:  Procedure(s) with comments: CYSTOSCOPY WITH RETROGRADE PYELOGRAM, DIAGNOSTICURETEROSCOPY AND POSSIBLE STENT PLACEMENT (Left) - 1 HR  SURGEON:  Surgeon(s) and Role:    * Alexis Frock, MD - Primary  PHYSICIAN ASSISTANT:   ASSISTANTS: none   ANESTHESIA:   general  EBL:  minimal   BLOOD ADMINISTERED:none  DRAINS: none   LOCAL MEDICATIONS USED:  NONE  SPECIMEN:  No Specimen  DISPOSITION OF SPECIMEN:  N/A  COUNTS:  YES  TOURNIQUET:  * No tourniquets in log *  DICTATION: .Other Dictation: Dictation Number [id number]  PLAN OF CARE: Discharge to home after PACU  PATIENT DISPOSITION:  PACU - hemodynamically stable.   Delay start of Pharmacological VTE agent (>24hrs) due to surgical blood loss or risk of bleeding: not applicable

## 2021-04-21 NOTE — Anesthesia Postprocedure Evaluation (Signed)
Anesthesia Post Note  Patient: [name]  Procedure(s) Performed: CYSTOSCOPY WITH RETROGRADE PYELOGRAM, DIAGNOSTICURETEROSCOPY AND STENT PLACEMENT (Left: Ureter)     Patient location during evaluation: PACU Anesthesia Type: General Level of consciousness: awake and alert Pain management: pain level controlled Vital Signs Assessment: post-procedure vital signs reviewed and stable Respiratory status: spontaneous breathing, nonlabored ventilation and respiratory function stable Cardiovascular status: blood pressure returned to baseline and stable Postop Assessment: no apparent nausea or vomiting Anesthetic complications: no   No notable events documented.  Last Vitals:  Vitals:   04/21/21 1430 04/21/21 1445  BP: (!) 161/97 (!) 161/90  Pulse: 65 62  Resp: 13 14  Temp:    SpO2: 98% 99%    Last Pain:  Vitals:   04/21/21 1445  TempSrc:   PainSc: 0-No pain                 Kyliyah Stirn,W. EDMOND

## 2021-04-21 NOTE — Anesthesia Procedure Notes (Signed)
Procedure Name: LMA Insertion Date/Time: 04/21/2021 1:52 PM Performed by: Georgeanne Nim, CRNA Pre-anesthesia Checklist: Patient identified, Emergency Drugs available, Suction available, Patient being monitored and Timeout performed Patient Re-evaluated:Patient Re-evaluated prior to induction Oxygen Delivery Method: Circle system utilized Preoxygenation: Pre-oxygenation with 100% oxygen Ventilation: Mask ventilation without difficulty LMA: LMA inserted LMA Size: 5.0 Number of attempts: 1 Placement Confirmation: positive ETCO2, CO2 detector and breath sounds checked- equal and bilateral Tube secured with: Tape Dental Injury: Teeth and Oropharynx as per pre-operative assessment

## 2021-04-21 NOTE — Anesthesia Preprocedure Evaluation (Addendum)
Anesthesia Evaluation  Patient identified by MRN, date of birth, ID band Patient awake    Reviewed: Allergy & Precautions, H&P , NPO status , Patient's Chart, lab work & pertinent test results, reviewed documented beta blocker date and time   Airway Mallampati: II  TM Distance: >3 FB Neck ROM: Full    Dental no notable dental hx. (+) Teeth Intact, Dental Advisory Given   Pulmonary neg pulmonary ROS,    Pulmonary exam normal breath sounds clear to auscultation       Cardiovascular hypertension, Pt. on medications and Pt. on home beta blockers + dysrhythmias Atrial Fibrillation  Rhythm:Regular Rate:Normal     Neuro/Psych negative neurological ROS  negative psych ROS   GI/Hepatic Neg liver ROS, GERD  Medicated,  Endo/Other  negative endocrine ROS  Renal/GU Renal disease  negative genitourinary   Musculoskeletal  (+) Arthritis , Osteoarthritis,    Abdominal   Peds  Hematology negative hematology ROS (+)   Anesthesia Other Findings   Reproductive/Obstetrics negative OB ROS                            Anesthesia Physical Anesthesia Plan  ASA: 3  Anesthesia Plan: General   Post-op Pain Management:    Induction: Intravenous  PONV Risk Score and Plan: 3 and Ondansetron, Dexamethasone and Midazolam  Airway Management Planned: LMA  Additional Equipment:   Intra-op Plan:   Post-operative Plan: Extubation in OR  Informed Consent: I have reviewed the patients History and Physical, chart, labs and discussed the procedure including the risks, benefits and alternatives for the proposed anesthesia with the patient or authorized representative who has indicated his/her understanding and acceptance.     Dental advisory given  Plan Discussed with: CRNA  Anesthesia Plan Comments:         Anesthesia Quick Evaluation

## 2021-04-21 NOTE — Discharge Instructions (Addendum)
1 - You may have urinary urgency (bladder spasms) and bloody urine on / off with stent in place. This is normal.  2 - Call MD or go to ER for fever >102, severe pain / nausea / vomiting not relieved by medications, or acute change in medical status  CYSTOSCOPY HOME CARE INSTRUCTIONS  Activity: Rest for the remainder of the day.  Do not drive or operate equipment today.  You may resume normal activities in one to two days as instructed by your physician.   Meals: Drink plenty of liquids and eat light foods such as gelatin or soup this evening.  You may return to a normal meal plan tomorrow.  Return to Work: You may return to work in one to two days or as instructed by your physician.  Special Instructions / Symptoms: Call your physician if any of these symptoms occur:   -persistent or heavy bleeding  -bleeding which continues after first few urination  -large blood clots that are difficult to pass  -urine stream diminishes or stops completely  -fever equal to or higher than 101 degrees Farenheit.  -cloudy urine with a strong, foul odor  -severe pain  Females should always wipe from front to back after elimination.  You may feel some burning pain when you urinate.  This should disappear with time.  Applying moist heat to the lower abdomen or a hot tub bath may help relieve the pain. \  Follow-Up / Date of Return Visit to Your Physician:  as instructed Post Anesthesia Home Care Instructions  Activity: Get plenty of rest for the remainder of the day. A responsible individual must stay with you for 24 hours following the procedure.  For the next 24 hours, DO NOT: -Drive a car -Paediatric nurse -Drink alcoholic beverages -Take any medication unless instructed by your physician -Make any legal decisions or sign important papers.  Meals: Start with liquid foods such as gelatin or soup. Progress to regular foods as tolerated. Avoid greasy, spicy, heavy foods. If nausea and/or vomiting  occur, drink only clear liquids until the nausea and/or vomiting subsides. Call your physician if vomiting continues.  Special Instructions/Symptoms: Your throat may feel dry or sore from the anesthesia or the breathing tube placed in your throat during surgery. If this causes discomfort, gargle with warm salt water. The discomfort should disappear within 24 hours.  If you had a scopolamine patch placed behind your ear for the management of post- operative nausea and/or vomiting:  1. The medication in the patch is effective for 72 hours, after which it should be removed.  Wrap patch in a tissue and discard in the trash. Wash hands thoroughly with soap and water. 2. You may remove the patch earlier than 72 hours if you experience unpleasant side effects which may include dry mouth, dizziness or visual disturbances. 3. Avoid touching the patch. Wash your hands with soap and water after contact with the patch.     Call for an appointment to arrange follow-up.

## 2021-04-21 NOTE — Transfer of Care (Signed)
Immediate Anesthesia Transfer of Care Note  Patient: [name]  Procedure(s) Performed: CYSTOSCOPY WITH RETROGRADE PYELOGRAM, DIAGNOSTICURETEROSCOPY AND STENT PLACEMENT (Left: Ureter)  Patient Location: PACU  Anesthesia Type:General  Level of Consciousness: awake, alert , oriented and patient cooperative  Airway & Oxygen Therapy: Patient Spontanous Breathing and Patient connected to nasal cannula oxygen  Post-op Assessment: Report given to RN and Post -op Vital signs reviewed and stable  Post vital signs: Reviewed and stable  Last Vitals:  Vitals Value Taken Time  BP    Temp    Pulse 68 04/21/21 1425  Resp 15 04/21/21 1425  SpO2 95 % 04/21/21 1425  Vitals shown include unvalidated device data.  Last Pain:  Vitals:   04/21/21 1210  TempSrc: Oral  PainSc: 3       Patients Stated Pain Goal: 3 (37/09/64 3838)  Complications: No notable events documented.

## 2021-04-21 NOTE — H&P (Signed)
[name] is an 65 y.o. male.    Chief Complaint: Pre-OP LEFT Diagnostic Ureteroscopy  HPI:   1 - Left Hydronephrosis - new left mild-moderate hydro and midlind ureteral deviation in distal ureter by CT 02/2021 after difficult anterior approach re-do spine surgery by Dr. Saintclair Halsted and Carlis Abbott. 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 No parenchymal thinning. No flank pain / colic symptoms.   PMH sig for L spine surgery x 4 (3 posterior, one anterior), HTN (sees [place] cards, no ischemic disease), mild obesity. He is related to Dr. Barry Dienes in Gen Surg by marriage. His PCP is Beverely Low MD.   Today "[name]" is seen to proceed with LEFT diagnostic ureteroscopy / retrograde to further characterize left hydronephrosis. No interval fevers. Most recent UA without infectious parameters.   Past Medical History:  Diagnosis Date   Acquired deafness, right    per pt completed deafness right ear since age 76   Allergic rhinitis, seasonal    Anticoagulant long-term use    eliquis--- managed by cardiology   BPH associated with nocturia    urologist--- dr Tresa Moore   Chronic low back pain    DDD (degenerative disc disease), lumbosacral    Foraminal stenosis of lumbosacral region    GERD (gastroesophageal reflux disease)    Heart murmur    History of kidney stones    History of skin cancer    Hydronephrosis, left    Hyperlipidemia, mixed    Hypertension, essential    followed by pcp and cardiology   Hypokalemia    OA (osteoarthritis)    Paroxysmal atrial fibrillation ([place]) 01/28/2017   cardiologist--- dr Nehemiah Massed;   pt had ETT 03-16-2017 per cardio note normal, event monitor 02-09-2017 showed baseline SR some peroids  Afib with controlled rate with occ PAC/ PVC;  per pt had echo many yrs ago prior to AFib    Past Surgical History:  Procedure Laterality Date   ABDOMINAL EXPOSURE N/A 01/18/2021   Procedure: ABDOMINAL EXPOSURE;  Surgeon: [name],  MD;  Location: [facility];  Service: Vascular;  Laterality: N/A;   ANTERIOR LUMBAR FUSION N/A 01/18/2021   Procedure: Anterior Lumbar Interbody Fusion - Lumbar Five-Sarcal One;  Surgeon: Kary Kos, MD;  Location: [facility];  Service: Neurosurgery;  Laterality: N/A;  Anterior Lumbar Interbody Fusion - Lumbar Five-Sacral One   EXTRACORPOREAL SHOCK WAVE LITHOTRIPSY     approx 2002   LUMBAR [place] SURGERY  2018   REPAIR DURAL / CSF LEAK  2018   post op lumbar diskectomy   REVISION TOTAL KNEE ARTHROPLASTY Left 2015   TOTAL KNEE ARTHROPLASTY Left 2012   TRANSFORAMINAL LUMBAR INTERBODY FUSION (TLIF) WITH PEDICLE SCREW FIXATION 1 LEVEL N/A 06/12/2019   Procedure: LUMBAR FOUR-FIVE TRANSFORAMINAL LUMBAR INTERBODY FUSION (TLIF) WITH PEDICLE SCREW FIXATION;  Surgeon: Kary Kos, MD;  Location: [facility];  Service: Neurosurgery;  Laterality: N/A;   UMBILICAL HERNIA REPAIR  1992    Family History  Problem Relation Age of Onset   Cancer Mother    Cancer Father    Cancer Brother    Hyperlipidemia Brother    Social History:  reports that he has never smoked. He quit smokeless tobacco use about 12 months ago.  His smokeless tobacco use included chew. He reports current alcohol use. He reports that he does not use drugs.  Allergies:  Allergies  Allergen Reactions   Meloxicam Palpitations    No medications prior to admission.  No results found for this or any previous visit (from the past 48 hour(s)). No results found.  Review of Systems  Constitutional: Negative.  Negative for chills and fever.  All other systems reviewed and are negative.  Height 6\' 1"  (1.854 m), weight 111.1 kg. Physical Exam Vitals reviewed.  HENT:     Head: Normocephalic.     Nose: Nose normal.  Eyes:     Pupils: Pupils are equal, round, and reactive to light.  Cardiovascular:     Rate and Rhythm: Normal rate.  Pulmonary:     Effort: Pulmonary effort is normal.  Abdominal:     General: Abdomen is flat.  Genitourinary:     Comments: No CVAT at present. Prior back scars c/d/I.  Musculoskeletal:        General: Normal range of motion.     Cervical back: Normal range of motion.  Skin:    General: Skin is warm.  Neurological:     General: No focal deficit present.     Mental Status: He is alert.  Psychiatric:        Mood and Affect: Mood normal.     Assessment/Plan  Proceed as planned with LEFT diagnostic ureteroscopy, possible stenting to further characterize left hydronephrosis. Risks, benefits, alternatives, need for furhter intervention (ureteral reimplant) discussed previously and reiterated today.   Alexis Frock, MD 04/21/2021, 7:21 AM

## 2021-04-22 ENCOUNTER — Other Ambulatory Visit: Payer: Self-pay | Admitting: Urology

## 2021-04-22 NOTE — Op Note (Signed)
[name], [name] MEDICAL RECORD NO: [id number] ACCOUNT NO: [id number] DATE OF BIRTH: 04/02/1956 FACILITY: [facility] LOCATION: [facility] PHYSICIAN: Alexis Frock, MD  Operative Report   DATE OF PROCEDURE: 04/21/2021  PREOPERATIVE DIAGNOSES:  Left hydronephrosis.  PROCEDURE PERFORMED: 1.  Cystoscopy with left retrograde pyelogram, interpretation. 2.  Left diagnostic ureteroscopy. 3.  Insertion of left ureteral stent, 5 x 26 Polaris, no tether.  ESTIMATED BLOOD LOSS:  Nil.  COMPLICATIONS:  None.  SPECIMENS:  None.  FINDINGS: 1.  Left medial deviation of distal third ureter with mild hydronephrosis above this. 2.  Inability to visualize middle ureter due to the fixed tortuosity of the distal ureter. 3.  Successful placement of left ureteral stent, proximal end in renal pelvis, distal end in the urinary bladder.  INDICATIONS:  The patient is a very pleasant 65 year old man with a longstanding history of lumbar spine disease.  He is status post several surgeries for this, which had been quite successful in helping him with his pain.  He was noted on imaging  followup after his most recent spine surgery to have new hydronephrosis of the kidney with some medial deviation of the distal ureter concerning for possible tethering and partial obstruction. Fortunately, he does not have a history of flank pain, renal  insufficiency or recurrent infections.  He was referred for consideration of management.  Options were discussed including recommended path of diagnostic ureteroscopy with goal of verifying ureteral anatomy, ruling out high-grade obstruction and  operative planning for definitive repair should obstruction be present.  Informed consent was obtained and placed in the medical record.  DESCRIPTION OF PROCEDURE:  The patient being verified, procedure being cystoscopy with left diagnostic ureteroscopy, possible stenting was confirmed.  Procedure timeout was performed.  Intravenous  antibiotics administered.  General LMA anesthesia  induced. The patient was placed into a low lithotomy position.  Sterile field was created, prepped and draped the patient's penis, perineum, and proximal thighs using iodine and cystourethroscopy was performed using 21-French rigid cystoscope with offset  lens,  Inspection of the anterior and posterior urethra was relatively unremarkable.  Bladder neck was slightly high riding.  Inspection of bladder revealed no diverticula, calcifications or papillary lesions.  Ureteral orifices were singleton.  The  left ureteral orifice was cannulated with a 6-French renal catheter and a left retrograde pyelogram was obtained.  Left retrograde pyelogram demonstrated single left ureter, single system left kidney.  There was significant medial deviation of the distal ureter close to the area of the sacral promontory and lower lumbar spine area, with mild to moderate  hydronephrosis and ureteronephrosis above this.  A 0.038 ZIPwire was advanced to the level of the upper pole, set aside as a safety wire.  An 8-French feeding tube placed in the urinary bladder for pressure release and semirigid ureteroscopy was  performed in the distal most left ureter alongside a separate sensor working wire. In the area of the distal ureter just above the intramural ureter, the caliber became quite narrow and there was significant medial angulation, such that a semirigid  ureteroscope was not able to pass this area.  The semirigid scope was then exchanged for the flexible digital ureteroscope. An attempt was made to pass this over the sensor working wire and again this was also unsuccessful due to the angulation of the  ureter was fairly significant and likely obstructive and endoscopic management alone would likely be insufficient to decompress the kidney and prevent any renal deterioration.  It was felt that interval stenting  would be warranted for definitive repair  and a new 5 x 26  Polaris type stent was placed over the remaining safety wire using fluoroscopic guidance.  Good proximal and distal plane were noted.  Procedure was terminated.  The patient tolerated the procedure well.  No immediate perioperative  complications.  The patient was taken to postanesthesia care unit in stable condition.  PLAN: For discharge home.   SHW D: 04/21/2021 2:17:39 pm T: 04/21/2021 9:59:00 pm  JOB: [id number]/ [id number]

## 2021-04-23 ENCOUNTER — Encounter (HOSPITAL_BASED_OUTPATIENT_CLINIC_OR_DEPARTMENT_OTHER): Payer: Self-pay | Admitting: Urology

## 2021-05-02 IMAGING — RF DG C-ARM 1-60 MIN
1 series · 2 of 2 positions shown · non-contrast
Comparison: Lumbar spine CT 03/03/2020.

CLINICAL DATA: Lumbar fusion L2-3 and L3-4.

EXAM:
LUMBAR SPINE - 2-3 VIEW; DG C-ARM 1-60 MIN

[Series 1: run · 2 of 2 slices shown]
[im 1/2]
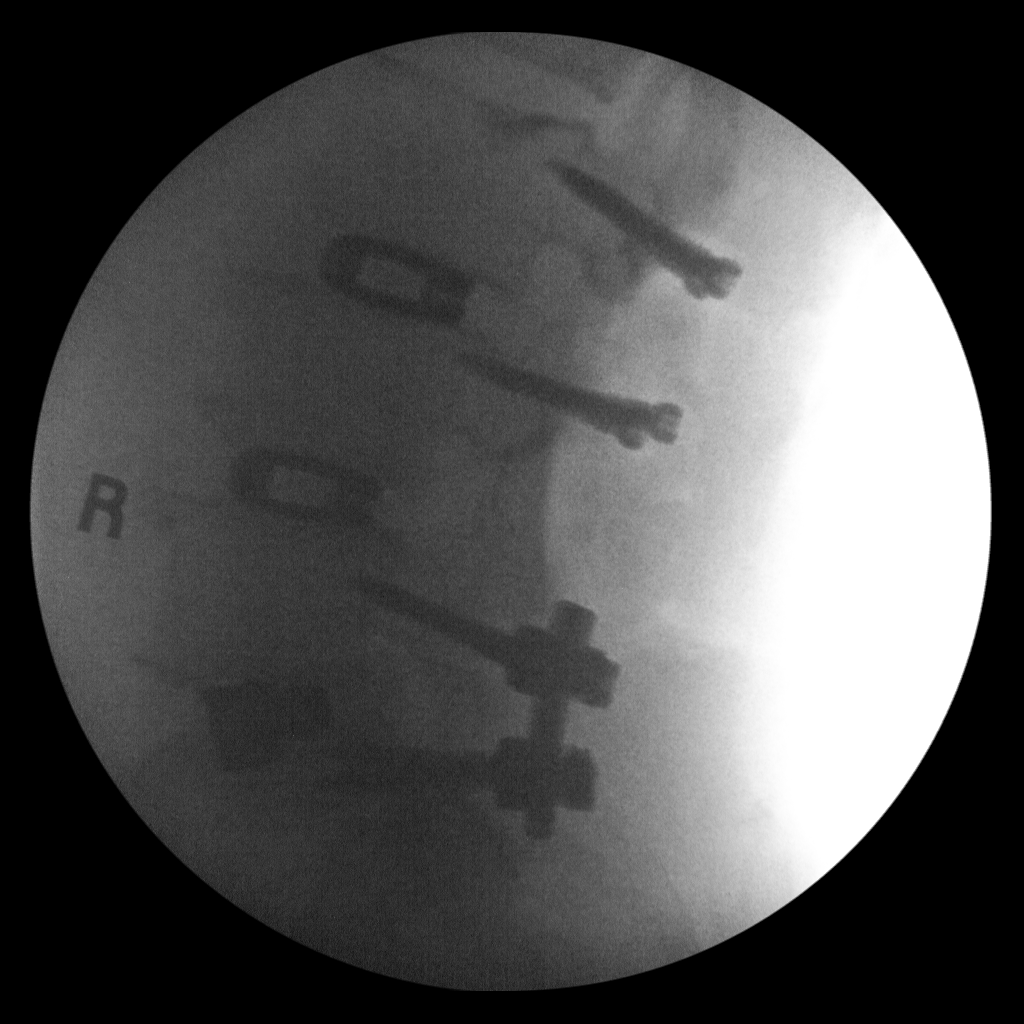
[im 2/2]
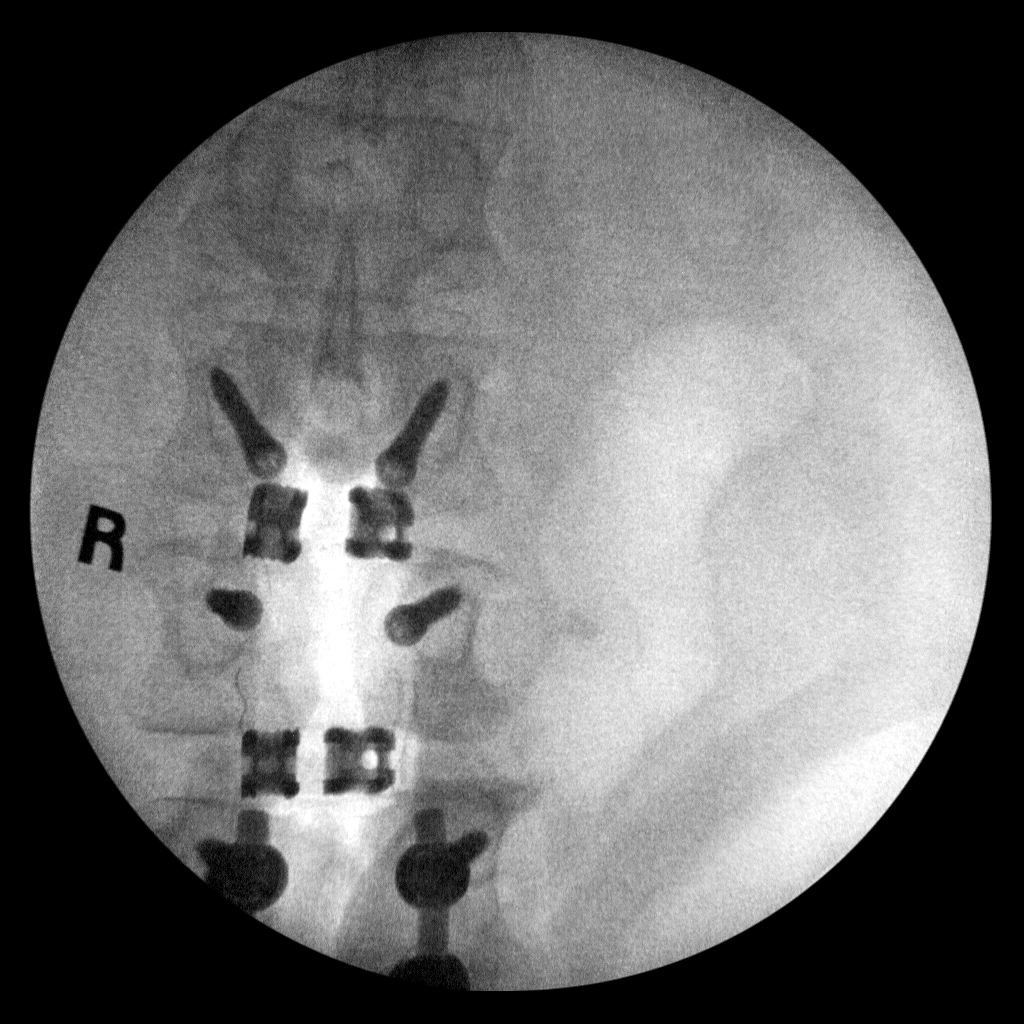

[2 of 2 positions shown; findings below may reference images not displayed]

FINDINGS: C-arm fluoroscopy was provided in the operating [HOSPITAL] seconds of
fluoroscopy time.

Two spot fluoroscopic images of the lumbar spine are submitted.
These demonstrate the placement of a new pedicle screws bilaterally
at L2 and L3. The interconnecting rods have not yet been placed.
There are new interbody cages at L2-3 and L3-4 which are well
positioned. Laminectomy defects are noted. Pre-existing hardware
from L4-5 PLIF appears unchanged.
IMPRESSION: Intraoperative views during L2-3 and L3-4 fusion. No demonstrated
complication.

## 2021-05-02 IMAGING — RF DG LUMBAR SPINE 2-3V
1 series · 2 of 2 positions shown · non-contrast
Comparison: Lumbar spine CT 03/03/2020.

CLINICAL DATA: Lumbar fusion L2-3 and L3-4.

EXAM:
LUMBAR SPINE - 2-3 VIEW; DG C-ARM 1-60 MIN

[Series 1: run · 2 of 2 slices shown]
[im 1/2]
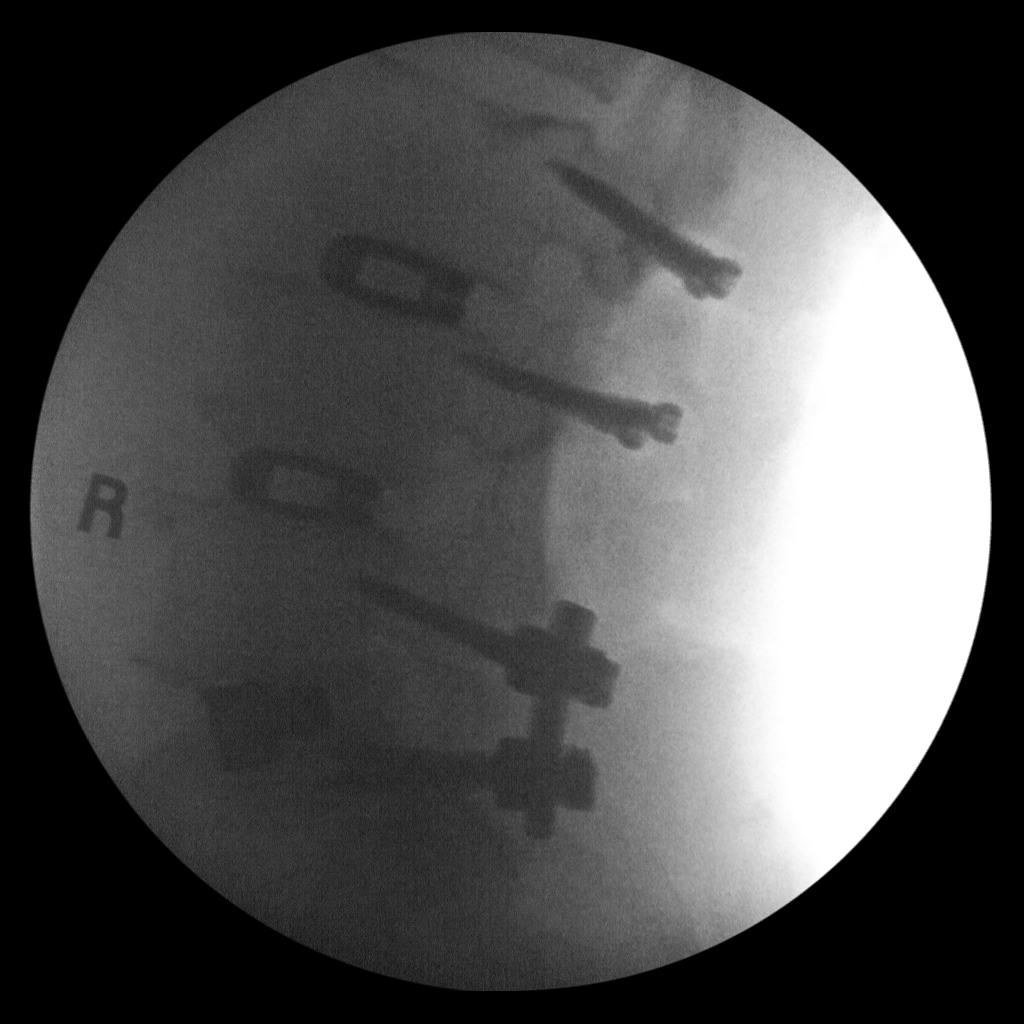
[im 2/2]
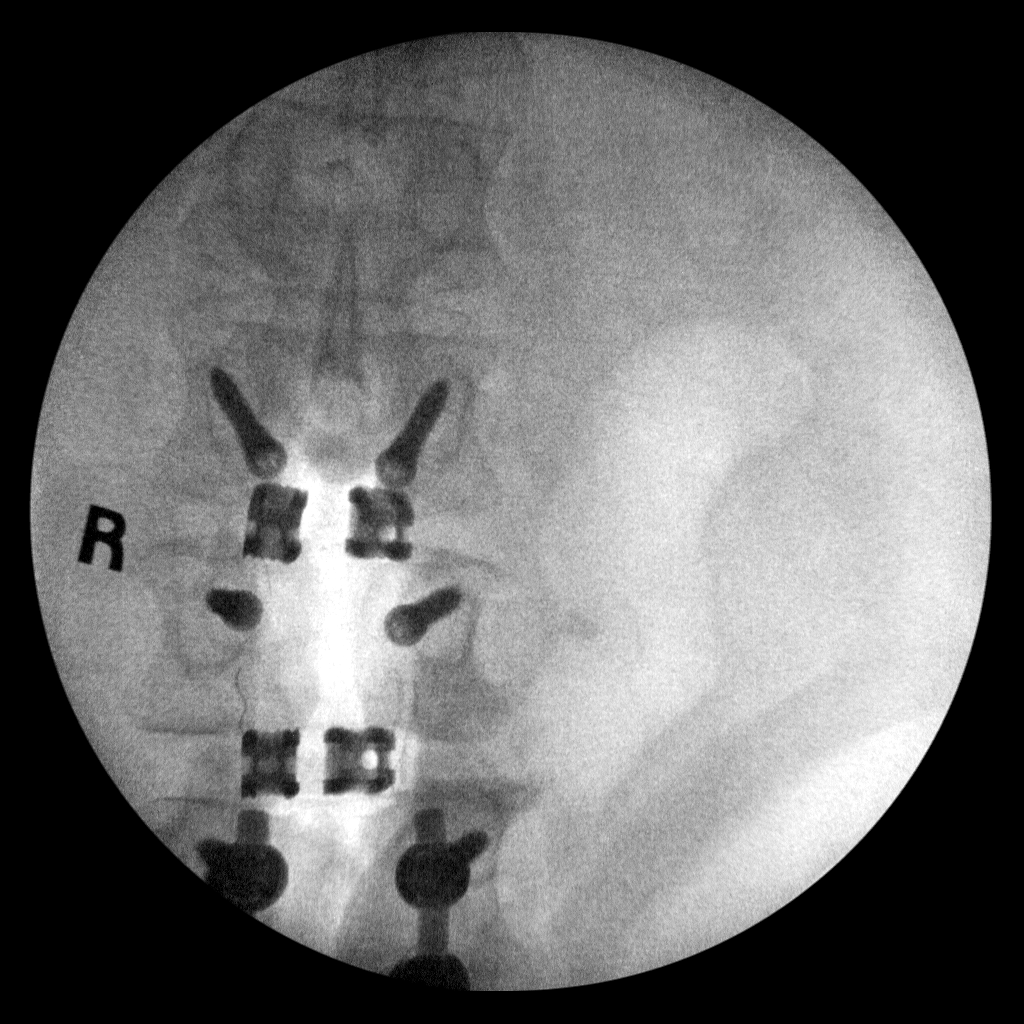

[2 of 2 positions shown; findings below may reference images not displayed]

FINDINGS: C-arm fluoroscopy was provided in the operating [HOSPITAL] seconds of
fluoroscopy time.

Two spot fluoroscopic images of the lumbar spine are submitted.
These demonstrate the placement of a new pedicle screws bilaterally
at L2 and L3. The interconnecting rods have not yet been placed.
There are new interbody cages at L2-3 and L3-4 which are well
positioned. Laminectomy defects are noted. Pre-existing hardware
from L4-5 PLIF appears unchanged.
IMPRESSION: Intraoperative views during L2-3 and L3-4 fusion. No demonstrated
complication.

## 2021-05-06 DIAGNOSIS — M5440 Lumbago with sciatica, unspecified side: Secondary | ICD-10-CM | POA: Diagnosis not present

## 2021-05-06 DIAGNOSIS — M461 Sacroiliitis, not elsewhere classified: Secondary | ICD-10-CM | POA: Diagnosis not present

## 2021-05-06 DIAGNOSIS — I10 Essential (primary) hypertension: Secondary | ICD-10-CM | POA: Diagnosis not present

## 2021-05-06 DIAGNOSIS — Z6832 Body mass index (BMI) 32.0-32.9, adult: Secondary | ICD-10-CM | POA: Diagnosis not present

## 2021-05-07 DIAGNOSIS — M5136 Other intervertebral disc degeneration, lumbar region: Secondary | ICD-10-CM | POA: Diagnosis not present

## 2021-05-07 DIAGNOSIS — M47816 Spondylosis without myelopathy or radiculopathy, lumbar region: Secondary | ICD-10-CM | POA: Diagnosis not present

## 2021-05-07 DIAGNOSIS — M461 Sacroiliitis, not elsewhere classified: Secondary | ICD-10-CM | POA: Diagnosis not present

## 2021-05-07 DIAGNOSIS — M5416 Radiculopathy, lumbar region: Secondary | ICD-10-CM | POA: Diagnosis not present

## 2021-05-13 DIAGNOSIS — N135 Crossing vessel and stricture of ureter without hydronephrosis: Secondary | ICD-10-CM | POA: Diagnosis not present

## 2021-05-20 DIAGNOSIS — M5459 Other low back pain: Secondary | ICD-10-CM | POA: Diagnosis not present

## 2021-05-25 DIAGNOSIS — Z79891 Long term (current) use of opiate analgesic: Secondary | ICD-10-CM | POA: Diagnosis not present

## 2021-05-25 DIAGNOSIS — M961 Postlaminectomy syndrome, not elsewhere classified: Secondary | ICD-10-CM | POA: Diagnosis not present

## 2021-05-25 DIAGNOSIS — M5136 Other intervertebral disc degeneration, lumbar region: Secondary | ICD-10-CM | POA: Diagnosis not present

## 2021-05-25 DIAGNOSIS — M461 Sacroiliitis, not elsewhere classified: Secondary | ICD-10-CM | POA: Diagnosis not present

## 2021-05-25 DIAGNOSIS — Z981 Arthrodesis status: Secondary | ICD-10-CM | POA: Diagnosis not present

## 2021-05-25 DIAGNOSIS — M47816 Spondylosis without myelopathy or radiculopathy, lumbar region: Secondary | ICD-10-CM | POA: Diagnosis not present

## 2021-06-07 DIAGNOSIS — M5459 Other low back pain: Secondary | ICD-10-CM | POA: Diagnosis not present

## 2021-06-07 NOTE — Patient Instructions (Addendum)
DUE TO COVID-19 ONLY ONE VISITOR IS ALLOWED TO COME WITH YOU AND STAY IN THE WAITING ROOM ONLY DURING PRE OP AND PROCEDURE DAY OF SURGERY. THE 2 VISITORS  MAY VISIT WITH YOU AFTER SURGERY IN YOUR PRIVATE ROOM DURING VISITING HOURS ONLY!  YOU NEED TO HAVE A COVID 19 TEST ON__8/24_____ THIS TEST MUST BE DONE BEFORE SURGERY,               [name]     Your procedure is scheduled on: 06/18/21   Report to [facility] Main  Entrance   Report to short stay at 5:15 AM     Call this number if you have problems the morning of surgery [phone]    Remember: Do not eat food after Midnight.  You may have clear liquids until 4:30 am   BRUSH YOUR TEETH MORNING OF SURGERY AND RINSE YOUR MOUTH OUT, NO CHEWING GUM CANDY OR MINTS.     Take these medicines the morning of surgery with A SIP OF WATER: Metoprolol, Diltiazem, Tamsulosin Pantoprazole  Stop taking Eliquis___________on __8/23________as instructed by _Manny____________.                                   You may not have any metal on your body including              piercings  Do not wear jewelry, lotions, powders or deodorant             Men may shave face and neck.   Do not bring valuables to the hospital. [employer].  Contacts, dentures or bridgework may not be worn into surgery.  .                Please read over the following fact sheets you were given: _____________________________________________________________________             [facility] - Preparing for Surgery Before surgery, you can play an important role.  Because skin is not sterile, your skin needs to be as free of germs as possible.  You can reduce the number of germs on your skin by washing with CHG (chlorahexidine gluconate) soap before surgery.  CHG is an antiseptic cleaner which kills germs and bonds with the skin to continue killing germs even after washing. Please DO NOT use if you have an  allergy to CHG or antibacterial soaps.  If your skin becomes reddened/irritated stop using the CHG and inform your nurse when you arrive at Short Stay. You may shave your face/neck. Please follow these instructions carefully:  1.  Shower with CHG Soap the night before surgery and the  morning of Surgery.  2.  If you choose to wash your hair, wash your hair first as usual with your  normal  shampoo.  3.  After you shampoo, rinse your hair and body thoroughly to remove the  shampoo.                                        4.  Use CHG as you would any other liquid soap.  You can apply chg directly  to the skin and wash  Gently with a scrungie or clean washcloth.  5.  Apply the CHG Soap to your body ONLY FROM THE NECK DOWN.   Do not use on face/ open                           Wound or open sores. Avoid contact with eyes, ears mouth and genitals (private parts).                       Wash face,  Genitals (private parts) with your normal soap.             6.  Wash thoroughly, paying special attention to the area where your surgery  will be performed.  7.  Thoroughly rinse your body with warm water from the neck down.  8.  DO NOT shower/wash with your normal soap after using and rinsing off  the CHG Soap.             9.  Pat yourself dry with a clean towel.            10.  Wear clean pajamas.            11.  Place clean sheets on your bed the night of your first shower and do not  sleep with pets. Day of Surgery : Do not apply any lotions/deodorants the morning of surgery.  Please wear clean clothes to the hospital/surgery center.  FAILURE TO FOLLOW THESE INSTRUCTIONS MAY RESULT IN THE CANCELLATION OF YOUR SURGERY PATIENT SIGNATURE_________________________________  NURSE SIGNATURE__________________________________  ________________________________________________________________________

## 2021-06-08 ENCOUNTER — Ambulatory Visit: Payer: Medicare PPO | Admitting: Family Medicine

## 2021-06-08 ENCOUNTER — Encounter: Payer: Self-pay | Admitting: Family Medicine

## 2021-06-08 ENCOUNTER — Other Ambulatory Visit: Payer: Self-pay

## 2021-06-08 ENCOUNTER — Encounter (HOSPITAL_COMMUNITY): Payer: Self-pay

## 2021-06-08 ENCOUNTER — Encounter (HOSPITAL_COMMUNITY)
Admission: RE | Admit: 2021-06-08 | Discharge: 2021-06-08 | Disposition: A | Discharge status: Home / Self Care | Payer: Medicare PPO | Source: Ambulatory Visit | Attending: Urology | Admitting: Urology

## 2021-06-08 VITALS — BP 144/78 | HR 72 | Ht 73.0 in | Wt 246.0 lb

## 2021-06-08 DIAGNOSIS — I10 Essential (primary) hypertension: Secondary | ICD-10-CM

## 2021-06-08 DIAGNOSIS — Z87891 Personal history of nicotine dependence: Secondary | ICD-10-CM | POA: Insufficient documentation

## 2021-06-08 DIAGNOSIS — Z01812 Encounter for preprocedural laboratory examination: Secondary | ICD-10-CM | POA: Diagnosis not present

## 2021-06-08 DIAGNOSIS — N135 Crossing vessel and stricture of ureter without hydronephrosis: Secondary | ICD-10-CM | POA: Diagnosis not present

## 2021-06-08 DIAGNOSIS — Z7901 Long term (current) use of anticoagulants: Secondary | ICD-10-CM | POA: Insufficient documentation

## 2021-06-08 DIAGNOSIS — Z79899 Other long term (current) drug therapy: Secondary | ICD-10-CM | POA: Insufficient documentation

## 2021-06-08 DIAGNOSIS — E785 Hyperlipidemia, unspecified: Secondary | ICD-10-CM | POA: Insufficient documentation

## 2021-06-08 DIAGNOSIS — I480 Paroxysmal atrial fibrillation: Secondary | ICD-10-CM | POA: Insufficient documentation

## 2021-06-08 LAB — CBC
HCT: 40.5 % (ref 39.0–52.0)
Hemoglobin: 12.5 g/dL — ABNORMAL LOW (ref 13.0–17.0)
MCH: 24.9 pg — ABNORMAL LOW (ref 26.0–34.0)
MCHC: 30.9 g/dL (ref 30.0–36.0)
MCV: 80.5 fL (ref 80.0–100.0)
Platelets: 202 10*3/uL (ref 150–400)
RBC: 5.03 MIL/uL (ref 4.22–5.81)
RDW: 15.6 % — ABNORMAL HIGH (ref 11.5–15.5)
WBC: 15.1 10*3/uL — ABNORMAL HIGH (ref 4.0–10.5)
nRBC: 0 % (ref 0.0–0.2)

## 2021-06-08 LAB — BASIC METABOLIC PANEL
Anion gap: 8 (ref 5–15)
BUN: 16 mg/dL (ref 8–23)
CO2: 27 mmol/L (ref 22–32)
Calcium: 9 mg/dL (ref 8.9–10.3)
Chloride: 101 mmol/L (ref 98–111)
Creatinine, Ser: 0.92 mg/dL (ref 0.61–1.24)
GFR, Estimated: >60 mL/min (ref >60)
Glucose, Bld: 116 mg/dL — ABNORMAL HIGH (ref 70–99)
Potassium: 3.4 mmol/L — ABNORMAL LOW (ref 3.5–5.1)
Sodium: 136 mmol/L (ref 135–145)

## 2021-06-08 MED ORDER — DILTIAZEM HCL ER COATED BEADS 180 MG PO CP24: 180.0000 mg | ORAL_CAPSULE | Freq: Two times a day (BID) | ORAL | 1 refills | Status: DC

## 2021-06-08 MED ORDER — METOPROLOL SUCCINATE ER 25 MG PO TB24
25.0000 mg | ORAL_TABLET | Freq: Every day | ORAL | 3 refills | Status: DC
Start: 2021-06-08 — End: 2021-08-24

## 2021-06-08 NOTE — Progress Notes (Signed)
Covid test 06/16/21  COVID Vaccine Completed:yes  PCP -  Dr D. Dorian Heckle 03/15/21 Cardiologist - Dr. Kelli Hope LOV 09/29/20  Chest x-ray - no EKG - 01/18/21 -epic Stress Test - no ECHO - 08/2020-requested Cardiac Cath - no Pacemaker/ICD device last checked:NA  Sleep Study - no CPAP -   Fasting Blood Sugar - NA Checks Blood Sugar _____ times a day  Blood Thinner Instructions:Eliquis/ Dr. Nehemiah Massed Aspirin Instructions:Stop 2 days prior to DOS/ Dr. Tresa Moore Last Dose:06/15/21  Anesthesia review: yes  Patient denies shortness of breath, fever, cough and chest pain at PAT appointment No SOB with activity  Patient verbalized understanding of instructions that were given to them at the PAT appointment. Patient was also instructed that they will need to review over the PAT instructions again at home before surgery. Yes

## 2021-06-08 NOTE — Progress Notes (Signed)
Date:  06/08/2021   Name:  [name]   DOB:  1955/10/25   MRN:  [id number]   Chief Complaint: Hypertension (173/93 after having 2 doses of doubling metoprolol)  Hypertension This is a chronic problem. The current episode started more than 1 year ago. The problem has been waxing and waning since onset. The problem is uncontrolled. Pertinent negatives include no anxiety, blurred vision, chest pain, headaches, malaise/fatigue, neck pain, orthopnea, palpitations, peripheral edema, PND, shortness of breath or sweats. Risk factors for coronary artery disease include dyslipidemia. Past treatments include calcium channel blockers, diuretics, beta blockers and ACE inhibitors. The current treatment provides moderate improvement. There are no compliance problems.  There is no history of angina, CAD/MI or CVA.   Lab Results  Component Value Date   CREATININE 0.92 06/08/2021   BUN 16 06/08/2021   NA 136 06/08/2021   K 3.4 (L) 06/08/2021   CL 101 06/08/2021   CO2 27 06/08/2021   Lab Results  Component Value Date   CHOL 180 03/04/2021   HDL 46 03/04/2021   LDLCALC 119 (H) 03/04/2021   TRIG 80 03/04/2021   CHOLHDL 4.1 08/07/2019   No results found for: TSH No results found for: HGBA1C Lab Results  Component Value Date   WBC 15.1 (H) 06/08/2021   HGB 12.5 (L) 06/08/2021   HCT 40.5 06/08/2021   MCV 80.5 06/08/2021   PLT 202 06/08/2021   Lab Results  Component Value Date   ALT 10 04/06/2020   AST 10 04/06/2020   ALKPHOS 147 (H) 04/06/2020   BILITOT 0.2 04/06/2020     Review of Systems  Constitutional:  Negative for chills, fever and malaise/fatigue.  HENT:  Negative for drooling, ear discharge, ear pain and sore throat.   Eyes:  Negative for blurred vision.  Respiratory:  Negative for cough, shortness of breath and wheezing.   Cardiovascular:  Negative for chest pain, palpitations, orthopnea, leg swelling and PND.  Gastrointestinal:  Negative for abdominal pain, blood in  stool, constipation, diarrhea and nausea.  Endocrine: Negative for polydipsia.  Genitourinary:  Negative for dysuria, frequency, hematuria and urgency.  Musculoskeletal:  Negative for back pain, myalgias and neck pain.  Skin:  Negative for rash.  Allergic/Immunologic: Negative for environmental allergies.  Neurological:  Negative for dizziness and headaches.  Hematological:  Does not bruise/bleed easily.  Psychiatric/Behavioral:  Negative for suicidal ideas. The patient is not nervous/anxious.    Patient Active Problem List   Diagnosis Date Noted   Spinal stenosis of lumbosacral region 01/18/2021   Herniated nucleus pulposus, lumbar 03/16/2020   Body mass index (BMI) 33.0-33.9, adult 09/03/2019   Other intervertebral disc displacement, lumbar region 06/25/2019   HNP (herniated nucleus pulposus), lumbar 06/12/2019   UTI due to Klebsiella species 05/08/2019   Biceps tendonitis on right 07/27/2018   Spinal stenosis, 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   Lumbago with sciatica, unspecified side 11/14/2017   Primary osteoarthritis of right knee 10/12/2017   Taking multiple medications for chronic disease 07/30/2015   Bronchitis 07/01/2015   Reactive airway disease 07/01/2015   Allergic sinusitis 07/01/2015   Essential (primary) hypertension 07/01/2015   Valvular heart disease 07/01/2015   Metatarsalgia of left foot 06/10/2015   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    Allergies  Allergen Reactions   Meloxicam Palpitations    Past Surgical History:  Procedure Laterality Date   ABDOMINAL EXPOSURE N/A 01/18/2021   Procedure: ABDOMINAL EXPOSURE;  Surgeon: [name], MD;  Location: [facility]  OR;  Service: Vascular;  Laterality: N/A;   ANTERIOR LUMBAR FUSION N/A 01/18/2021   Procedure: Anterior Lumbar Interbody Fusion - Lumbar Five-Sarcal One;  Surgeon: Kary Kos, MD;  Location: [place];  Service: Neurosurgery;  Laterality: N/A;  Anterior Lumbar Interbody Fusion - Lumbar  Five-Sacral One   CYSTOSCOPY WITH RETROGRADE PYELOGRAM, URETEROSCOPY AND STENT PLACEMENT Left 04/21/2021   Procedure: CYSTOSCOPY WITH RETROGRADE PYELOGRAM, DIAGNOSTICURETEROSCOPY AND STENT PLACEMENT;  Surgeon: Alexis Frock, MD;  Location: [facility];  Service: Urology;  Laterality: Left;  1 HR   EXTRACORPOREAL SHOCK WAVE LITHOTRIPSY     approx 2002   LUMBAR [place] SURGERY  2018   REPAIR DURAL / CSF LEAK  2018   post op lumbar diskectomy   REVISION TOTAL KNEE ARTHROPLASTY Left 2015   TOTAL KNEE ARTHROPLASTY Left 2012   TRANSFORAMINAL LUMBAR INTERBODY FUSION (TLIF) WITH PEDICLE SCREW FIXATION 1 LEVEL N/A 06/12/2019   Procedure: LUMBAR FOUR-FIVE TRANSFORAMINAL LUMBAR INTERBODY FUSION (TLIF) WITH PEDICLE SCREW FIXATION;  Surgeon: Kary Kos, MD;  Location: [facility];  Service: Neurosurgery;  Laterality: N/A;   UMBILICAL HERNIA REPAIR  1992    Social History   Tobacco Use   Smoking status: Never   Smokeless tobacco: Former    Types: Chew    Quit date: 03/2020  Vaping Use   Vaping Use: Never used  Substance Use Topics   Alcohol use: Yes    Comment: ocassional   Drug use: Never     Medication list has been reviewed and updated.  Current Meds  Medication Sig   apixaban (ELIQUIS) 5 MG TABS tablet Take 5 mg by mouth 2 (two) times daily.   celecoxib (CELEBREX) 200 MG capsule Take 1 capsule (200 mg total) by mouth daily.   diltiazem (CARDIZEM CD) 120 MG 24 hr capsule Take 1 capsule (120 mg total) by mouth in the morning and at bedtime. (Patient taking differently: Take 120 mg by mouth 2 (two) times daily.)   fexofenadine (ALLEGRA) 180 MG tablet Take 1 tablet (180 mg total) by mouth daily.   fluticasone (FLONASE) 50 MCG/ACT nasal spray Use 1 spray(s) in each nostril once daily (Patient taking differently: Place 1 spray into both nostrils daily as needed for allergies.)   hydrochlorothiazide (HYDRODIURIL) 12.5 MG tablet Take 1 tablet (12.5 mg total) by mouth daily.   lisinopril  (ZESTRIL) 20 MG tablet Take 1 tablet (20 mg total) by mouth every evening. (Patient taking differently: Take 20 mg by mouth at bedtime.)   methocarbamol (ROBAXIN) 500 MG tablet Take 1 tablet (500 mg total) by mouth 4 (four) times daily.   metoprolol tartrate (LOPRESSOR) 50 MG tablet Take 0.5 tablets (25 mg total) by mouth 2 (two) times daily. (Patient taking differently: Take 50 mg by mouth 2 (two) times daily.)   montelukast (SINGULAIR) 10 MG tablet Take 1 tablet (10 mg total) by mouth at bedtime.   Multiple Vitamins-Minerals (MULTIVITAMIN WITH MINERALS) tablet Take 1 tablet by mouth daily.   Olopatadine HCl 0.2 % SOLN Place 1 drop into both eyes 2 (two) times daily as needed (allergies.).   pantoprazole (PROTONIX) 40 MG tablet Take 1 tablet (40 mg total) by mouth daily. (Patient taking differently: Take 40 mg by mouth every evening.)   Phenazopyridine HCl (AZO URINARY PAIN PO) Take 1 capsule by mouth daily.   potassium chloride SA (KLOR-CON) 20 MEQ tablet Take 3 tablets (60 mEq total) by mouth daily. (Patient taking differently: Take 20-40 mEq by mouth 2 (two) times daily. Takes  one tablet in am and two tabs in pm)   senna-docusate (SENOKOT-S) 8.6-50 MG tablet Take 1 tablet by mouth 2 (two) times daily. While taking strong pain meds to prevent constipation   tadalafil (CIALIS) 5 MG tablet Take 1-2 tablets (5-10 mg total) by mouth daily as needed for erectile dysfunction.   tamsulosin (FLOMAX) 0.4 MG CAPS capsule TAKE ONE (1) CAPSULE EACH DAY. (Patient taking differently: Take 0.4 mg by mouth daily.)    PHQ 2/9 Scores 03/04/2021 09/14/2020 09/14/2020 05/26/2020  PHQ - 2 Score 0 0 0 0  PHQ- 9 Score 0 - 0 0    GAD 7 : Generalized Anxiety Score 03/04/2021 09/14/2020 04/06/2020 09/03/2019  Nervous, Anxious, on Edge 0 0 0 0  Control/stop worrying 0 0 0 0  Worry too much - different things 0 0 0 0  Trouble relaxing 0 0 0 0  Restless 0 0 0 0  Easily annoyed or irritable 0 0 0 0  Afraid - awful might  happen 0 0 0 0  Total GAD 7 Score 0 0 0 0    BP Readings from Last 3 Encounters:  06/08/21 (!) 144/78  06/08/21 (!) 177/93  04/21/21 (!) 166/93    Physical Exam Vitals and nursing note reviewed.  HENT:     Head: Normocephalic.     Right Ear: Tympanic membrane, ear canal and external ear normal. There is no impacted cerumen.     Left Ear: Tympanic membrane, ear canal and external ear normal. There is no impacted cerumen.     Nose: Nose normal.  Eyes:     General: No scleral icterus.       Right eye: No discharge.        Left eye: No discharge.     Conjunctiva/sclera: Conjunctivae normal.     Pupils: Pupils are equal, round, and reactive to light.  Neck:     Thyroid: No thyromegaly.     Vascular: No JVD.     Trachea: No tracheal deviation.  Cardiovascular:     Rate and Rhythm: Normal rate and regular rhythm.     Heart sounds: Normal heart sounds. No murmur heard.   No friction rub. No gallop.  Pulmonary:     Effort: No respiratory distress.     Breath sounds: Normal breath sounds. No wheezing or rales.  Abdominal:     General: Bowel sounds are normal.     Palpations: Abdomen is soft. There is no mass.     Tenderness: There is no abdominal tenderness. There is no guarding or rebound.  Musculoskeletal:        General: No tenderness. Normal range of motion.     Cervical back: Normal range of motion and neck supple.  Lymphadenopathy:     Cervical: No cervical adenopathy.  Skin:    General: Skin is warm.     Findings: No rash.  Neurological:     Mental Status: He is alert and oriented to person, place, and time.     Cranial Nerves: No cranial nerve deficit.     Deep Tendon Reflexes: Reflexes are normal and symmetric.    Wt Readings from Last 3 Encounters:  06/08/21 246 lb (111.6 kg)  06/08/21 244 lb (110.7 kg)  04/21/21 242 lb 14.4 oz (110.2 kg)    BP (!) 144/78   Pulse 72   Ht '6\' 1"'$  (1.854 m)   Wt 246 lb (111.6 kg)   BMI 32.46 kg/m   Assessment and  Plan:  1. Essential hypertension Chronic.  Waxes and wanes but for the most part controlled.  Patient has upcoming surgery and work and the need to get his blood pressure down more so.  We have instructed him to continue his lisinopril 20 mg once a day and hydrochlorothiazide.  I have changed his metoprolol to 25 XL [place] the 25 twice a day.  And I have increase his diltiazem CD1 180 mg twice a day which is a 50% increase from his 120 twice a day.  We will recheck him on Tuesday of next week at about the time he usually goes for surgical evaluation to see if this is sufficient.  I have discussed with him with Dr. Juanetta Snow and perhaps our next step is going to be switching over from lisinopril to valsartan up equivalent or increase strength. - metoprolol succinate (TOPROL-XL) 25 MG 24 hr tablet; Take 1 tablet (25 mg total) by mouth daily.  Dispense: 30 tablet; Refill: 3 - diltiazem (CARDIZEM CD) 180 MG 24 hr capsule; Take 1 capsule (180 mg total) by mouth 2 (two) times daily.  Dispense: 60 capsule; Refill: 1

## 2021-06-09 ENCOUNTER — Other Ambulatory Visit
Admission: RE | Admit: 2021-06-09 | Discharge: 2021-06-09 | Disposition: A | Discharge status: Home / Self Care | Payer: Medicare PPO | Attending: Family Medicine | Admitting: Family Medicine

## 2021-06-09 ENCOUNTER — Encounter: Payer: Self-pay | Admitting: Family Medicine

## 2021-06-09 ENCOUNTER — Ambulatory Visit: Payer: Medicare PPO | Admitting: Family Medicine

## 2021-06-09 VITALS — BP 138/80 | HR 112 | Temp 98.8°F | Ht 73.0 in | Wt 246.0 lb

## 2021-06-09 DIAGNOSIS — R350 Frequency of micturition: Secondary | ICD-10-CM | POA: Diagnosis not present

## 2021-06-09 DIAGNOSIS — R5081 Fever presenting with conditions classified elsewhere: Secondary | ICD-10-CM | POA: Diagnosis not present

## 2021-06-09 DIAGNOSIS — R8290 Unspecified abnormal findings in urine: Secondary | ICD-10-CM

## 2021-06-09 DIAGNOSIS — N135 Crossing vessel and stricture of ureter without hydronephrosis: Secondary | ICD-10-CM | POA: Diagnosis not present

## 2021-06-09 DIAGNOSIS — R509 Fever, unspecified: Secondary | ICD-10-CM

## 2021-06-09 DIAGNOSIS — R300 Dysuria: Secondary | ICD-10-CM | POA: Diagnosis not present

## 2021-06-09 LAB — CBC WITH DIFFERENTIAL/PLATELET
Abs Immature Granulocytes: 0.14 10*3/uL — ABNORMAL HIGH (ref 0.00–0.07)
Basophils Absolute: 0 10*3/uL (ref 0.0–0.1)
Basophils Relative: 0 %
Eosinophils Absolute: 0 10*3/uL (ref 0.0–0.5)
Eosinophils Relative: 0 %
HCT: 41.2 % (ref 39.0–52.0)
Hemoglobin: 13.1 g/dL (ref 13.0–17.0)
Immature Granulocytes: 1 %
Lymphocytes Relative: 7 %
Lymphs Abs: 1.3 10*3/uL (ref 0.7–4.0)
MCH: 24.7 pg — ABNORMAL LOW (ref 26.0–34.0)
MCHC: 31.8 g/dL (ref 30.0–36.0)
MCV: 77.7 fL — ABNORMAL LOW (ref 80.0–100.0)
Monocytes Absolute: 1.2 10*3/uL — ABNORMAL HIGH (ref 0.1–1.0)
Monocytes Relative: 6 %
Neutro Abs: 16.5 10*3/uL — ABNORMAL HIGH (ref 1.7–7.7)
Neutrophils Relative %: 86 %
Platelets: 213 10*3/uL (ref 150–400)
RBC: 5.3 MIL/uL (ref 4.22–5.81)
RDW: 15.7 % — ABNORMAL HIGH (ref 11.5–15.5)
WBC: 19.1 10*3/uL — ABNORMAL HIGH (ref 4.0–10.5)
nRBC: 0 % (ref 0.0–0.2)

## 2021-06-09 LAB — POCT URINALYSIS DIPSTICK
Bilirubin, UA: NEGATIVE
Glucose, UA: NEGATIVE
Nitrite, UA: NEGATIVE
Protein, UA: NEGATIVE
Spec Grav, UA: 1.01 (ref 1.010–1.025)
Urobilinogen, UA: 0.2 E.U./dL
pH, UA: 7.5 (ref 5.0–8.0)

## 2021-06-09 NOTE — Progress Notes (Signed)
Date:  06/09/2021   Name:  [name]   DOB:  [birth year]   MRN:  [id number]   Chief Complaint: Urinary Tract Infection (Fever, chills, sweating, not allot of urine at a time- stent placement a month ago)  Urinary Tract Infection  This is a new problem. The current episode started yesterday. The problem occurs every urination. The problem has been rapidly worsening. The quality of the pain is described as burning and aching. The pain is at a severity of 3/10. The pain is mild. The maximum temperature recorded prior to his arrival was 100 - 100.9 F. The fever has been present for Less than 1 day. He is Sexually active. Associated symptoms include chills, frequency, hematuria, sweats and urgency. Pertinent negatives include no discharge, flank pain, hesitancy, nausea, possible pregnancy or vomiting. He has tried acetaminophen for the symptoms. The treatment provided no relief. His past medical history is significant for kidney stones and a urological procedure. There is no history of recurrent UTIs.   Lab Results  Component Value Date   CREATININE 0.92 06/08/2021   BUN 16 06/08/2021   NA 136 06/08/2021   K 3.4 (L) 06/08/2021   CL 101 06/08/2021   CO2 27 06/08/2021   Lab Results  Component Value Date   CHOL 180 03/04/2021   HDL 46 03/04/2021   LDLCALC 119 (H) 03/04/2021   TRIG 80 03/04/2021   CHOLHDL 4.1 08/07/2019   No results found for: TSH No results found for: HGBA1C Lab Results  Component Value Date   WBC 15.1 (H) 06/08/2021   HGB 12.5 (L) 06/08/2021   HCT 40.5 06/08/2021   MCV 80.5 06/08/2021   PLT 202 06/08/2021   Lab Results  Component Value Date   ALT 10 04/06/2020   AST 10 04/06/2020   ALKPHOS 147 (H) 04/06/2020   BILITOT 0.2 04/06/2020     Review of Systems  Constitutional:  Positive for chills and fever.       Malaise  HENT:  Negative for drooling, ear discharge, ear pain and sore throat.   Respiratory:  Negative for cough, shortness of breath and  wheezing.   Cardiovascular:  Negative for chest pain, palpitations and leg swelling.  Gastrointestinal:  Negative for abdominal pain, blood in stool, constipation, diarrhea, nausea and vomiting.  Endocrine: Negative for polydipsia.  Genitourinary:  Positive for frequency, hematuria and urgency. Negative for dysuria, flank pain and hesitancy.  Musculoskeletal:  Negative for back pain, myalgias and neck pain.  Skin:  Negative for rash.  Allergic/Immunologic: Negative for environmental allergies.  Neurological:  Negative for dizziness and headaches.  Hematological:  Does not bruise/bleed easily.  Psychiatric/Behavioral:  Negative for suicidal ideas. The patient is not nervous/anxious.    Patient Active Problem List   Diagnosis Date Noted   Sacroiliitis ([place]) 05/07/2021   Spinal stenosis of lumbosacral region 01/18/2021   Herniated nucleus pulposus, lumbar 03/16/2020   Body mass index (BMI) 33.0-33.9, adult 09/03/2019   Other intervertebral disc displacement, lumbar region 06/25/2019   HNP (herniated nucleus pulposus), lumbar 06/12/2019   UTI due to Klebsiella species 05/08/2019   Biceps tendonitis on right 07/27/2018   Spinal stenosis, 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   Lumbago with sciatica, unspecified side 11/14/2017   Primary osteoarthritis of right knee 10/12/2017   Taking multiple medications for chronic disease 07/30/2015   Bronchitis 07/01/2015   Reactive airway disease 07/01/2015   Allergic sinusitis 07/01/2015   Essential (primary) hypertension 07/01/2015   Valvular heart  disease 07/01/2015   Metatarsalgia of left foot 06/10/2015   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    Allergies  Allergen Reactions   Meloxicam Palpitations    Past Surgical History:  Procedure Laterality Date   ABDOMINAL EXPOSURE N/A 01/18/2021   Procedure: ABDOMINAL EXPOSURE;  Surgeon: [name], MD;  Location: [place];  Service: Vascular;  Laterality: N/A;   ANTERIOR LUMBAR  FUSION N/A 01/18/2021   Procedure: Anterior Lumbar Interbody Fusion - Lumbar Five-Sarcal One;  Surgeon: Kary Kos, MD;  Location: [place];  Service: Neurosurgery;  Laterality: N/A;  Anterior Lumbar Interbody Fusion - Lumbar Five-Sacral One   CYSTOSCOPY WITH RETROGRADE PYELOGRAM, URETEROSCOPY AND STENT PLACEMENT Left 04/21/2021   Procedure: CYSTOSCOPY WITH RETROGRADE PYELOGRAM, DIAGNOSTICURETEROSCOPY AND STENT PLACEMENT;  Surgeon: Alexis Frock, MD;  Location: [facility];  Service: Urology;  Laterality: Left;  1 HR   EXTRACORPOREAL SHOCK WAVE LITHOTRIPSY     approx 2002   LUMBAR [place] SURGERY  2018   REPAIR DURAL / CSF LEAK  2018   post op lumbar diskectomy   REVISION TOTAL KNEE ARTHROPLASTY Left 2015   TOTAL KNEE ARTHROPLASTY Left 2012   TRANSFORAMINAL LUMBAR INTERBODY FUSION (TLIF) WITH PEDICLE SCREW FIXATION 1 LEVEL N/A 06/12/2019   Procedure: LUMBAR FOUR-FIVE TRANSFORAMINAL LUMBAR INTERBODY FUSION (TLIF) WITH PEDICLE SCREW FIXATION;  Surgeon: Kary Kos, MD;  Location: [place];  Service: Neurosurgery;  Laterality: N/A;   UMBILICAL HERNIA REPAIR  1992    Social History   Tobacco Use   Smoking status: Never   Smokeless tobacco: Former    Types: Chew    Quit date: 03/2020  Vaping Use   Vaping Use: Never used  Substance Use Topics   Alcohol use: Yes    Comment: ocassional   Drug use: Never     Medication list has been reviewed and updated.  Current Meds  Medication Sig   apixaban (ELIQUIS) 5 MG TABS tablet Take 5 mg by mouth 2 (two) times daily.   celecoxib (CELEBREX) 200 MG capsule Take 1 capsule (200 mg total) by mouth daily.   diltiazem (CARDIZEM CD) 180 MG 24 hr capsule Take 1 capsule (180 mg total) by mouth 2 (two) times daily.   fexofenadine (ALLEGRA) 180 MG tablet Take 1 tablet (180 mg total) by mouth daily.   fluticasone (FLONASE) 50 MCG/ACT nasal spray Use 1 spray(s) in each nostril once daily (Patient taking differently: Place 1 spray into both nostrils  daily as needed for allergies.)   hydrochlorothiazide (HYDRODIURIL) 12.5 MG tablet Take 1 tablet (12.5 mg total) by mouth daily.   lisinopril (ZESTRIL) 20 MG tablet Take 1 tablet (20 mg total) by mouth every evening. (Patient taking differently: Take 20 mg by mouth at bedtime.)   methocarbamol (ROBAXIN) 500 MG tablet Take 1 tablet (500 mg total) by mouth 4 (four) times daily.   metoprolol succinate (TOPROL-XL) 25 MG 24 hr tablet Take 1 tablet (25 mg total) by mouth daily.   montelukast (SINGULAIR) 10 MG tablet Take 1 tablet (10 mg total) by mouth at bedtime.   Multiple Vitamins-Minerals (MULTIVITAMIN WITH MINERALS) tablet Take 1 tablet by mouth daily.   Olopatadine HCl 0.2 % SOLN Place 1 drop into both eyes 2 (two) times daily as needed (allergies.).   pantoprazole (PROTONIX) 40 MG tablet Take 1 tablet (40 mg total) by mouth daily. (Patient taking differently: Take 40 mg by mouth every evening.)   Phenazopyridine HCl (AZO URINARY PAIN PO) Take 1 capsule by mouth daily.   potassium  chloride SA (KLOR-CON) 20 MEQ tablet Take 3 tablets (60 mEq total) by mouth daily. (Patient taking differently: Take 20-40 mEq by mouth 2 (two) times daily. Takes one tablet in am and two tabs in pm)   senna-docusate (SENOKOT-S) 8.6-50 MG tablet Take 1 tablet by mouth 2 (two) times daily. While taking strong pain meds to prevent constipation   tadalafil (CIALIS) 5 MG tablet Take 1-2 tablets (5-10 mg total) by mouth daily as needed for erectile dysfunction.   tamsulosin (FLOMAX) 0.4 MG CAPS capsule TAKE ONE (1) CAPSULE EACH DAY. (Patient taking differently: Take 0.4 mg by mouth daily.)   [DISCONTINUED] metoprolol tartrate (LOPRESSOR) 50 MG tablet Take 0.5 tablets (25 mg total) by mouth 2 (two) times daily. (Patient taking differently: Take 50 mg by mouth 2 (two) times daily.)    PHQ 2/9 Scores 03/04/2021 09/14/2020 09/14/2020 05/26/2020  PHQ - 2 Score 0 0 0 0  PHQ- 9 Score 0 - 0 0    GAD 7 : Generalized Anxiety Score  03/04/2021 09/14/2020 04/06/2020 09/03/2019  Nervous, Anxious, on Edge 0 0 0 0  Control/stop worrying 0 0 0 0  Worry too much - different things 0 0 0 0  Trouble relaxing 0 0 0 0  Restless 0 0 0 0  Easily annoyed or irritable 0 0 0 0  Afraid - awful might happen 0 0 0 0  Total GAD 7 Score 0 0 0 0    BP Readings from Last 3 Encounters:  06/08/21 (!) 177/93  06/08/21 (!) 144/78  04/21/21 (!) 166/93    Physical Exam Vitals and nursing note reviewed.  HENT:     Head: Normocephalic.     Right Ear: External ear normal.     Left Ear: External ear normal.     Nose: Nose normal.  Eyes:     General: No scleral icterus.       Right eye: No discharge.        Left eye: No discharge.     Conjunctiva/sclera: Conjunctivae normal.     Pupils: Pupils are equal, round, and reactive to light.  Neck:     Thyroid: No thyromegaly.     Vascular: No JVD.     Trachea: No tracheal deviation.  Cardiovascular:     Rate and Rhythm: Regular rhythm. Tachycardia present.     Heart sounds: Normal heart sounds. No murmur heard.   No friction rub. No gallop.  Pulmonary:     Effort: No respiratory distress.     Breath sounds: Normal breath sounds. No wheezing or rales.  Abdominal:     General: Bowel sounds are normal.     Palpations: Abdomen is soft. There is no hepatomegaly, splenomegaly or mass.     Tenderness: There is no abdominal tenderness. There is no right CVA tenderness, left CVA tenderness, guarding or rebound.  Genitourinary:    Prostate: Enlarged and tender.     Rectum: Normal. Guaiac result negative. No tenderness.  Musculoskeletal:        General: No tenderness. Normal range of motion.     Cervical back: Normal range of motion and neck supple.  Lymphadenopathy:     Cervical: No cervical adenopathy.  Skin:    General: Skin is warm.     Findings: No rash.  Neurological:     Mental Status: He is alert and oriented to person, place, and time.     Cranial Nerves: No cranial nerve deficit.      Deep Tendon Reflexes:  Reflexes are normal and symmetric.    Wt Readings from Last 3 Encounters:  06/09/21 246 lb (111.6 kg)  06/08/21 244 lb (110.7 kg)  06/08/21 246 lb (111.6 kg)    Ht '6\' 1"'$  (1.854 m)   Wt 246 lb (111.6 kg)   BMI 32.46 kg/m   Assessment and Plan: Onset yesterday of malaise and myalgias after appointment for blood pressure check.  Patient had a gradual increase of discomfort with urination and frequency. 1. Urinary frequency Onset last night.  Persistent.  Patient is not feeling well.  Urinalysis notes leukocytes and red cells.  We will send urine for culture.  Rectal exam notes that the prostate is mildly enlarged has some tenderness but patient describes that he has a sensation in the perianal area there is no palpable perirectal mass.  Patient is to be seen by urologist on call and we will get a CBC stat for them to help for evaluation. - POCT Urinalysis Dipstick  2. Dysuria As noted - POCT Urinalysis Dipstick  3. Abnormal urinalysis As per dipstick notes leukocytes and red cells. - Urine Culture  4. Fever and chills Because of the overall malaise myalgias fever chills and tachycardia I have concerns the patient may have a pyelonephritis/prostatitis/remote possibility of sepsis.

## 2021-06-09 NOTE — Anesthesia Preprocedure Evaluation (Addendum)
Anesthesia Evaluation  Patient identified by MRN, date of birth, ID band Patient awake    Reviewed: Allergy & Precautions, NPO status , Patient's Chart, lab work & pertinent test results, reviewed documented beta blocker date and time   Airway Mallampati: II  TM Distance: >3 FB Neck ROM: Full    Dental no notable dental hx.    Pulmonary neg pulmonary ROS,    Pulmonary exam normal breath sounds clear to auscultation       Cardiovascular hypertension, Pt. on medications and Pt. on home beta blockers Normal cardiovascular exam+ dysrhythmias Atrial Fibrillation  Rhythm:Regular Rate:Normal  ECG: SB, rate 59   Neuro/Psych  Neuromuscular disease negative psych ROS   GI/Hepatic Neg liver ROS, GERD  Medicated and Controlled,  Endo/Other  negative endocrine ROS  Renal/GU Renal disease     Musculoskeletal  (+) Arthritis ,   Abdominal (+) + obese,   Peds  Hematology HLD   Anesthesia Other Findings LEFT URETERAL STRICTURE  Reproductive/Obstetrics                           Anesthesia Physical Anesthesia Plan  ASA: 3  Anesthesia Plan: General   Post-op Pain Management:    Induction: Intravenous  PONV Risk Score and Plan: 3 and Ondansetron, Dexamethasone, Midazolam and Treatment may vary due to age or medical condition  Airway Management Planned: Oral ETT  Additional Equipment:   Intra-op Plan:   Post-operative Plan: Extubation in OR  Informed Consent: I have reviewed the patients History and Physical, chart, labs and discussed the procedure including the risks, benefits and alternatives for the proposed anesthesia with the patient or authorized representative who has indicated his/her understanding and acceptance.     Dental advisory given  Plan Discussed with: CRNA  Anesthesia Plan Comments: (Reviewed APP note by Durel Salts, FNP )      Anesthesia Quick Evaluation

## 2021-06-09 NOTE — Progress Notes (Signed)
Anesthesia Chart Review:   Case: [id number] Date/Time: 06/18/21 0715   Procedures:      XI ROBOTICALLY ASSISTED LAPAROSCOPIC URETERAL RE-IMPLANTATION WITH PSOAS HITCH (Left) - 3 HRS     CYSTOSCOPY WITH RETROGRADE PYELOGRAM/URETERAL STENT EXCHANGE (Left)   Anesthesia type: General   Pre-op diagnosis: LEFT URETERAL STRICTURE   Location: [facility] ROOM 03 / [facility]   Surgeons: Alexis Frock, MD       DISCUSSION: Patient is a 65 year old male scheduled for the above procedure. He is s/p L4-5 transforaminal lumbar interbody fusion on 06/12/2019, L2-4 PLIF on 03/16/20, and L5-S1 ALIF 01/18/21   History includes former smoker, HTN, HLD, PAF (onset > 5 years ago), hard of hearing (right), murmur ("no murmur" 09/29/20 cardiology note), skin cancer. Said he "woke up" with knee surgery.    Last cardiology visit with Dr. Guy Franco was on 09/29/20. For PAF and HTN follow-up.  On metoprolol and diltiazem with flecainide as needed. BP 150/80. He cleared pt for ALIF at that visit.   Pt to stop eliquis 2 days before surgery   VS: BP (!) 177/93   Pulse 66   Temp 37.2 C (Oral)   Resp 18   Ht '6\' 1"'$  (1.854 m)   Wt 110.7 kg   SpO2 99%   BMI 32.19 kg/m   PROVIDERS: - PCP is Juline Patch, MD - Cardiologist is Serafina Royals, MD. (notes in care everywhere)   LABS: Labs reviewed: Acceptable for surgery. (all labs ordered are listed, but only abnormal results are displayed)  Labs Reviewed  CBC - Abnormal; Notable for the following components:      Result Value   WBC 15.1 (*)    Hemoglobin 12.5 (*)    MCH 24.9 (*)    RDW 15.6 (*)    All other components within normal limits  BASIC METABOLIC PANEL - Abnormal; Notable for the following components:   Potassium 3.4 (*)    Glucose, Bld 116 (*)    All other components within normal limits    EKG 01/18/21: sinus bradycardia   CV: ETT 03/16/17 [facility]): According to 03/22/17 note by Dr. Nehemiah Massed, "Regular Stress was performed showing Normal  test." (Copy on chart, received from [facility] 01/15/21.)   24 hour Holter Monitor 02/06/17 [facility]): Sinus - bradycardia/rhythm/arrhythmia. Rare SVE's.  Some paired.  Isolated 4 beat SVT run. Isolated PVCs.  Some baseline artifact noted.  No diary submitted.   There is no recent echocardiogram.   Past Medical History:  Diagnosis Date   Acquired deafness, right    per pt completed deafness right ear since age 85   Allergic rhinitis, seasonal    Anticoagulant long-term use    eliquis--- managed by cardiology   BPH associated with nocturia    urologist--- dr Tresa Moore   Chronic low back pain    DDD (degenerative disc disease), lumbosacral    Foraminal stenosis of lumbosacral region    GERD (gastroesophageal reflux disease)    Heart murmur    History of kidney stones    History of skin cancer    Hydronephrosis, left    Hyperlipidemia, mixed    Hypertension, essential    followed by pcp and cardiology   Hypokalemia    OA (osteoarthritis)    Paroxysmal atrial fibrillation ([place]) 01/28/2017   cardiologist--- dr Nehemiah Massed;   pt had ETT 03-16-2017 per cardio note normal, event monitor 02-09-2017 showed baseline SR some peroids  Afib with controlled rate with occ PAC/ PVC;  per pt  had echo many yrs ago prior to AFib    Past Surgical History:  Procedure Laterality Date   ABDOMINAL EXPOSURE N/A 01/18/2021   Procedure: ABDOMINAL EXPOSURE;  Surgeon: [name], MD;  Location: [place];  Service: Vascular;  Laterality: N/A;   ANTERIOR LUMBAR FUSION N/A 01/18/2021   Procedure: Anterior Lumbar Interbody Fusion - Lumbar Five-Sarcal One;  Surgeon: Kary Kos, MD;  Location: [place];  Service: Neurosurgery;  Laterality: N/A;  Anterior Lumbar Interbody Fusion - Lumbar Five-Sacral One   CYSTOSCOPY WITH RETROGRADE PYELOGRAM, URETEROSCOPY AND STENT PLACEMENT Left 04/21/2021   Procedure: CYSTOSCOPY WITH RETROGRADE PYELOGRAM, DIAGNOSTICURETEROSCOPY AND STENT PLACEMENT;  Surgeon: Alexis Frock, MD;  Location: [facility];  Service: Urology;  Laterality: Left;  1 HR   EXTRACORPOREAL SHOCK WAVE LITHOTRIPSY     approx 2002   LUMBAR [place] SURGERY  2018   REPAIR DURAL / CSF LEAK  2018   post op lumbar diskectomy   REVISION TOTAL KNEE ARTHROPLASTY Left 2015   TOTAL KNEE ARTHROPLASTY Left 2012   TRANSFORAMINAL LUMBAR INTERBODY FUSION (TLIF) WITH PEDICLE SCREW FIXATION 1 LEVEL N/A 06/12/2019   Procedure: LUMBAR FOUR-FIVE TRANSFORAMINAL LUMBAR INTERBODY FUSION (TLIF) WITH PEDICLE SCREW FIXATION;  Surgeon: Kary Kos, MD;  Location: [place];  Service: Neurosurgery;  Laterality: N/A;   UMBILICAL HERNIA REPAIR  1992    MEDICATIONS:  apixaban (ELIQUIS) 5 MG TABS tablet   celecoxib (CELEBREX) 200 MG capsule   diltiazem (CARDIZEM CD) 180 MG 24 hr capsule   fexofenadine (ALLEGRA) 180 MG tablet   fluticasone (FLONASE) 50 MCG/ACT nasal spray   hydrochlorothiazide (HYDRODIURIL) 12.5 MG tablet   lisinopril (ZESTRIL) 20 MG tablet   methocarbamol (ROBAXIN) 500 MG tablet   metoprolol succinate (TOPROL-XL) 25 MG 24 hr tablet   montelukast (SINGULAIR) 10 MG tablet   Multiple Vitamins-Minerals (MULTIVITAMIN WITH MINERALS) tablet   Olopatadine HCl 0.2 % SOLN   pantoprazole (PROTONIX) 40 MG tablet   Phenazopyridine HCl (AZO URINARY PAIN PO)   potassium chloride SA (KLOR-CON) 20 MEQ tablet   senna-docusate (SENOKOT-S) 8.6-50 MG tablet   tadalafil (CIALIS) 5 MG tablet   tamsulosin (FLOMAX) 0.4 MG CAPS capsule   No current facility-administered medications for this encounter.   - Pt to hold eliquis 2 days before surgery.   If no changes, I anticipate pt can proceed with surgery as scheduled.   Willeen Cass, PhD, FNP-BC [facility]/Anesthesiology Phone: [phone] 06/09/2021 2:21 PM

## 2021-06-11 LAB — URINE CULTURE: Organism ID, Bacteria: NO GROWTH

## 2021-06-15 ENCOUNTER — Other Ambulatory Visit: Payer: Self-pay

## 2021-06-15 ENCOUNTER — Ambulatory Visit: Payer: Medicare PPO | Admitting: Family Medicine

## 2021-06-15 ENCOUNTER — Encounter: Payer: Self-pay | Admitting: Family Medicine

## 2021-06-15 VITALS — BP 144/84 | HR 80 | Ht 73.0 in | Wt 241.0 lb

## 2021-06-15 DIAGNOSIS — I10 Essential (primary) hypertension: Secondary | ICD-10-CM

## 2021-06-15 NOTE — Progress Notes (Signed)
Date:  06/15/2021   Name:  [name]   DOB:  06/22/1956   MRN:  [id number]   Chief Complaint: Hypertension (Recheck b/p after changing metoprolol to XR- sees [facility] tomorrow and [facility] in 3 weeks)  Hypertension This is a chronic problem. The current episode started 1 to 4 weeks ago. The problem has been waxing and waning since onset. The problem is controlled. Pertinent negatives include no chest pain, headaches, neck pain, palpitations or shortness of breath. There are no associated agents to hypertension. Risk factors for coronary artery disease include dyslipidemia and male gender. Past treatments include beta blockers.   Lab Results  Component Value Date   CREATININE 0.92 06/08/2021   BUN 16 06/08/2021   NA 136 06/08/2021   K 3.4 (L) 06/08/2021   CL 101 06/08/2021   CO2 27 06/08/2021   Lab Results  Component Value Date   CHOL 180 03/04/2021   HDL 46 03/04/2021   LDLCALC 119 (H) 03/04/2021   TRIG 80 03/04/2021   CHOLHDL 4.1 08/07/2019   No results found for: TSH No results found for: HGBA1C Lab Results  Component Value Date   WBC 19.1 (H) 06/09/2021   HGB 13.1 06/09/2021   HCT 41.2 06/09/2021   MCV 77.7 (L) 06/09/2021   PLT 213 06/09/2021   Lab Results  Component Value Date   ALT 10 04/06/2020   AST 10 04/06/2020   ALKPHOS 147 (H) 04/06/2020   BILITOT 0.2 04/06/2020     Review of Systems  Constitutional:  Negative for chills and fever.  HENT:  Negative for drooling, ear discharge, ear pain and sore throat.   Respiratory:  Negative for cough, shortness of breath and wheezing.   Cardiovascular:  Negative for chest pain, palpitations and leg swelling.  Gastrointestinal:  Negative for abdominal pain, blood in stool, constipation, diarrhea and nausea.  Endocrine: Negative for polydipsia.  Genitourinary:  Negative for dysuria, frequency, hematuria and urgency.  Musculoskeletal:  Negative for back pain, myalgias and neck pain.  Skin:  Negative for  rash.  Allergic/Immunologic: Negative for environmental allergies.  Neurological:  Negative for dizziness and headaches.  Hematological:  Does not bruise/bleed easily.  Psychiatric/Behavioral:  Negative for suicidal ideas. The patient is not nervous/anxious.    Patient Active Problem List   Diagnosis Date Noted   Sacroiliitis ([place]) 05/07/2021   Spinal stenosis of lumbosacral region 01/18/2021   Herniated nucleus pulposus, lumbar 03/16/2020   Body mass index (BMI) 33.0-33.9, adult 09/03/2019   Other intervertebral disc displacement, lumbar region 06/25/2019   HNP (herniated nucleus pulposus), lumbar 06/12/2019   UTI due to Klebsiella species 05/08/2019   Biceps tendonitis on right 07/27/2018   Spinal stenosis, 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   Lumbago with sciatica, unspecified side 11/14/2017   Primary osteoarthritis of right knee 10/12/2017   Taking multiple medications for chronic disease 07/30/2015   Bronchitis 07/01/2015   Reactive airway disease 07/01/2015   Allergic sinusitis 07/01/2015   Essential (primary) hypertension 07/01/2015   Valvular heart disease 07/01/2015   Metatarsalgia of left foot 06/10/2015   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    Allergies  Allergen Reactions   Meloxicam Palpitations    Past Surgical History:  Procedure Laterality Date   ABDOMINAL EXPOSURE N/A 01/18/2021   Procedure: ABDOMINAL EXPOSURE;  Surgeon: [name], MD;  Location: [facility] OR;  Service: Vascular;  Laterality: N/A;   ANTERIOR LUMBAR FUSION N/A 01/18/2021   Procedure: Anterior Lumbar Interbody Fusion - Lumbar Five-Sarcal One;  Surgeon: Kary Kos, MD;  Location: [place];  Service: Neurosurgery;  Laterality: N/A;  Anterior Lumbar Interbody Fusion - Lumbar Five-Sacral One   CYSTOSCOPY WITH RETROGRADE PYELOGRAM, URETEROSCOPY AND STENT PLACEMENT Left 04/21/2021   Procedure: CYSTOSCOPY WITH RETROGRADE PYELOGRAM, DIAGNOSTICURETEROSCOPY AND STENT PLACEMENT;  Surgeon: Alexis Frock, MD;  Location: [facility];  Service: Urology;  Laterality: Left;  1 HR   EXTRACORPOREAL SHOCK WAVE LITHOTRIPSY     approx 2002   LUMBAR [place] SURGERY  2018   REPAIR DURAL / CSF LEAK  2018   post op lumbar diskectomy   REVISION TOTAL KNEE ARTHROPLASTY Left 2015   TOTAL KNEE ARTHROPLASTY Left 2012   TRANSFORAMINAL LUMBAR INTERBODY FUSION (TLIF) WITH PEDICLE SCREW FIXATION 1 LEVEL N/A 06/12/2019   Procedure: LUMBAR FOUR-FIVE TRANSFORAMINAL LUMBAR INTERBODY FUSION (TLIF) WITH PEDICLE SCREW FIXATION;  Surgeon: Kary Kos, MD;  Location: [place];  Service: Neurosurgery;  Laterality: N/A;   UMBILICAL HERNIA REPAIR  1992    Social History   Tobacco Use   Smoking status: Never   Smokeless tobacco: Former    Types: Chew    Quit date: 03/2020  Vaping Use   Vaping Use: Never used  Substance Use Topics   Alcohol use: Yes    Comment: ocassional   Drug use: Never     Medication list has been reviewed and updated.  Current Meds  Medication Sig   apixaban (ELIQUIS) 5 MG TABS tablet Take 5 mg by mouth 2 (two) times daily.   celecoxib (CELEBREX) 200 MG capsule Take 1 capsule (200 mg total) by mouth daily.   diltiazem (CARDIZEM CD) 180 MG 24 hr capsule Take 1 capsule (180 mg total) by mouth 2 (two) times daily.   fexofenadine (ALLEGRA) 180 MG tablet Take 1 tablet (180 mg total) by mouth daily.   fluticasone (FLONASE) 50 MCG/ACT nasal spray Use 1 spray(s) in each nostril once daily (Patient taking differently: Place 1 spray into both nostrils daily as needed for allergies.)   hydrochlorothiazide (HYDRODIURIL) 12.5 MG tablet Take 1 tablet (12.5 mg total) by mouth daily.   lisinopril (ZESTRIL) 20 MG tablet Take 1 tablet (20 mg total) by mouth every evening. (Patient taking differently: Take 20 mg by mouth at bedtime.)   methocarbamol (ROBAXIN) 500 MG tablet Take 1 tablet (500 mg total) by mouth 4 (four) times daily.   metoprolol succinate (TOPROL-XL) 25 MG 24 hr tablet Take 1  tablet (25 mg total) by mouth daily.   montelukast (SINGULAIR) 10 MG tablet Take 1 tablet (10 mg total) by mouth at bedtime.   Multiple Vitamins-Minerals (MULTIVITAMIN WITH MINERALS) tablet Take 1 tablet by mouth daily.   Olopatadine HCl 0.2 % SOLN Place 1 drop into both eyes 2 (two) times daily as needed (allergies.).   pantoprazole (PROTONIX) 40 MG tablet Take 1 tablet (40 mg total) by mouth daily. (Patient taking differently: Take 40 mg by mouth every evening.)   Phenazopyridine HCl (AZO URINARY PAIN PO) Take 1 capsule by mouth daily.   potassium chloride SA (KLOR-CON) 20 MEQ tablet Take 3 tablets (60 mEq total) by mouth daily. (Patient taking differently: Take 20-40 mEq by mouth 2 (two) times daily. Takes one tablet in am and two tabs in pm)   senna-docusate (SENOKOT-S) 8.6-50 MG tablet Take 1 tablet by mouth 2 (two) times daily. While taking strong pain meds to prevent constipation   tadalafil (CIALIS) 5 MG tablet Take 1-2 tablets (5-10 mg total) by mouth daily as needed for erectile  dysfunction.   tamsulosin (FLOMAX) 0.4 MG CAPS capsule TAKE ONE (1) CAPSULE EACH DAY. (Patient taking differently: Take 0.4 mg by mouth daily.)    PHQ 2/9 Scores 06/15/2021 03/04/2021 09/14/2020 09/14/2020  PHQ - 2 Score 0 0 0 0  PHQ- 9 Score 0 0 - 0    GAD 7 : Generalized Anxiety Score 06/15/2021 03/04/2021 09/14/2020 04/06/2020  Nervous, Anxious, on Edge 0 0 0 0  Control/stop worrying 0 0 0 0  Worry too much - different things 0 0 0 0  Trouble relaxing 0 0 0 0  Restless 0 0 0 0  Easily annoyed or irritable 0 0 0 0  Afraid - awful might happen 0 0 0 0  Total GAD 7 Score 0 0 0 0    BP Readings from Last 3 Encounters:  06/15/21 (!) 144/84  06/09/21 138/80  06/08/21 (!) 177/93    Physical Exam Vitals and nursing note reviewed.  HENT:     Head: Normocephalic.     Right Ear: Tympanic membrane and external ear normal.     Left Ear: Tympanic membrane and external ear normal.     Nose: Nose normal.   Eyes:     General: No scleral icterus.       Right eye: No discharge.        Left eye: No discharge.     Conjunctiva/sclera: Conjunctivae normal.     Pupils: Pupils are equal, round, and reactive to light.  Neck:     Thyroid: No thyromegaly.     Vascular: No JVD.     Trachea: No tracheal deviation.  Cardiovascular:     Rate and Rhythm: Normal rate and regular rhythm.     Chest Wall: PMI is not displaced.     Pulses: Normal pulses.     Heart sounds: Normal heart sounds, S1 normal and S2 normal. No murmur heard. No systolic murmur is present.  No diastolic murmur is present.    No friction rub. No gallop. No S3 or S4 sounds.  Pulmonary:     Effort: No respiratory distress.     Breath sounds: Normal breath sounds. No wheezing or rales.  Abdominal:     General: Bowel sounds are normal.     Palpations: Abdomen is soft. There is no mass.     Tenderness: There is no abdominal tenderness. There is no guarding or rebound.  Musculoskeletal:        General: No tenderness. Normal range of motion.     Cervical back: Normal range of motion and neck supple.  Lymphadenopathy:     Cervical: No cervical adenopathy.  Skin:    General: Skin is warm.     Findings: No rash.  Neurological:     Mental Status: He is alert and oriented to person, place, and time.     Cranial Nerves: No cranial nerve deficit.     Deep Tendon Reflexes: Reflexes are normal and symmetric.    Wt Readings from Last 3 Encounters:  06/15/21 241 lb (109.3 kg)  06/09/21 246 lb (111.6 kg)  06/08/21 244 lb (110.7 kg)    BP (!) 144/84   Pulse 80   Ht '6\' 1"'$  (1.854 m)   Wt 241 lb (109.3 kg)   BMI 31.80 kg/m   Assessment and Plan:  1. Essential hypertension Chronic.  Controlled.  Stable.  Blood pressure is 144/84.  Patient will continue on current regimen of metoprolol extended release 25 mg diltiazem 180 mg twice a day  Zestril 20 mg once a day and hydrochlorothiazide 12.5 mg once a day.  Patient has upcoming  appointment with cardiology for reevaluation and we will continue with the present regimen.  Patient is a little on the dry side and I have encouraged him to take more fluids during the operative period.

## 2021-06-16 ENCOUNTER — Other Ambulatory Visit: Payer: Self-pay | Admitting: Urology

## 2021-06-16 DIAGNOSIS — N135 Crossing vessel and stricture of ureter without hydronephrosis: Secondary | ICD-10-CM | POA: Diagnosis not present

## 2021-06-17 LAB — SARS CORONAVIRUS 2 (TAT 6-24 HRS): SARS Coronavirus 2: NEGATIVE

## 2021-06-18 ENCOUNTER — Inpatient Hospital Stay (HOSPITAL_COMMUNITY): Payer: Medicare PPO

## 2021-06-18 ENCOUNTER — Encounter (HOSPITAL_COMMUNITY)
Admission: RE | Disposition: A | Discharge status: Home / Self Care | Payer: Self-pay | Source: Home / Self Care | Attending: Urology

## 2021-06-18 ENCOUNTER — Inpatient Hospital Stay (HOSPITAL_COMMUNITY)
Admission: RE | Admit: 2021-06-18 | Discharge: 2021-06-19 | DRG: 655 | Disposition: A | Discharge status: Home / Self Care | Payer: Medicare PPO | Attending: Urology | Admitting: Urology

## 2021-06-18 ENCOUNTER — Encounter (HOSPITAL_COMMUNITY): Payer: Self-pay | Admitting: Urology

## 2021-06-18 ENCOUNTER — Inpatient Hospital Stay (HOSPITAL_COMMUNITY): Payer: Medicare PPO | Admitting: Certified Registered"

## 2021-06-18 ENCOUNTER — Other Ambulatory Visit: Payer: Self-pay

## 2021-06-18 ENCOUNTER — Inpatient Hospital Stay (HOSPITAL_COMMUNITY): Payer: Medicare PPO | Admitting: Emergency Medicine

## 2021-06-18 DIAGNOSIS — Z981 Arthrodesis status: Secondary | ICD-10-CM | POA: Diagnosis not present

## 2021-06-18 DIAGNOSIS — E782 Mixed hyperlipidemia: Secondary | ICD-10-CM | POA: Diagnosis present

## 2021-06-18 DIAGNOSIS — Z886 Allergy status to analgesic agent status: Secondary | ICD-10-CM

## 2021-06-18 DIAGNOSIS — N131 Hydronephrosis with ureteral stricture, not elsewhere classified: Principal | ICD-10-CM | POA: Diagnosis present

## 2021-06-18 DIAGNOSIS — Z85828 Personal history of other malignant neoplasm of skin: Secondary | ICD-10-CM

## 2021-06-18 DIAGNOSIS — I10 Essential (primary) hypertension: Secondary | ICD-10-CM | POA: Diagnosis present

## 2021-06-18 DIAGNOSIS — Z79899 Other long term (current) drug therapy: Secondary | ICD-10-CM | POA: Diagnosis not present

## 2021-06-18 DIAGNOSIS — Z8349 Family history of other endocrine, nutritional and metabolic diseases: Secondary | ICD-10-CM | POA: Diagnosis not present

## 2021-06-18 DIAGNOSIS — I480 Paroxysmal atrial fibrillation: Secondary | ICD-10-CM | POA: Diagnosis present

## 2021-06-18 DIAGNOSIS — Z791 Long term (current) use of non-steroidal anti-inflammatories (NSAID): Secondary | ICD-10-CM

## 2021-06-18 DIAGNOSIS — R351 Nocturia: Secondary | ICD-10-CM | POA: Diagnosis not present

## 2021-06-18 DIAGNOSIS — N135 Crossing vessel and stricture of ureter without hydronephrosis: Secondary | ICD-10-CM | POA: Diagnosis not present

## 2021-06-18 DIAGNOSIS — M5126 Other intervertebral disc displacement, lumbar region: Secondary | ICD-10-CM | POA: Diagnosis not present

## 2021-06-18 DIAGNOSIS — M1990 Unspecified osteoarthritis, unspecified site: Secondary | ICD-10-CM | POA: Diagnosis present

## 2021-06-18 DIAGNOSIS — G8929 Other chronic pain: Secondary | ICD-10-CM | POA: Diagnosis present

## 2021-06-18 DIAGNOSIS — H9191 Unspecified hearing loss, right ear: Secondary | ICD-10-CM | POA: Diagnosis present

## 2021-06-18 DIAGNOSIS — Z7901 Long term (current) use of anticoagulants: Secondary | ICD-10-CM | POA: Diagnosis not present

## 2021-06-18 DIAGNOSIS — K219 Gastro-esophageal reflux disease without esophagitis: Secondary | ICD-10-CM | POA: Diagnosis present

## 2021-06-18 DIAGNOSIS — Z87442 Personal history of urinary calculi: Secondary | ICD-10-CM | POA: Diagnosis not present

## 2021-06-18 DIAGNOSIS — N401 Enlarged prostate with lower urinary tract symptoms: Secondary | ICD-10-CM | POA: Diagnosis present

## 2021-06-18 DIAGNOSIS — Z96652 Presence of left artificial knee joint: Secondary | ICD-10-CM | POA: Diagnosis present

## 2021-06-18 DIAGNOSIS — J309 Allergic rhinitis, unspecified: Secondary | ICD-10-CM | POA: Diagnosis present

## 2021-06-18 DIAGNOSIS — Z87891 Personal history of nicotine dependence: Secondary | ICD-10-CM | POA: Diagnosis not present

## 2021-06-18 HISTORY — PX: CYSTOSCOPY W/ URETERAL STENT PLACEMENT: SHX1429

## 2021-06-18 LAB — HEMOGLOBIN AND HEMATOCRIT, BLOOD
HCT: 38.9 % — ABNORMAL LOW (ref 39.0–52.0)
Hemoglobin: 11.9 g/dL — ABNORMAL LOW (ref 13.0–17.0)

## 2021-06-18 SURGERY — REIMPLANTATION, URETER, ROBOT-ASSISTED, LAPAROSCOPIC
Anesthesia: General | Site: Ureter | Laterality: Left

## 2021-06-18 MED ORDER — FENTANYL CITRATE PF 50 MCG/ML IJ SOSY
PREFILLED_SYRINGE | INTRAMUSCULAR | Status: AC
  Administered 2021-06-18: 50 ug via INTRAVENOUS
  Filled 2021-06-18: qty 1

## 2021-06-18 MED ORDER — DIPHENHYDRAMINE HCL 12.5 MG/5ML PO ELIX: 12.5000 mg | ORAL_SOLUTION | Freq: Four times a day (QID) | ORAL | Status: DC | PRN

## 2021-06-18 MED ORDER — LACTATED RINGERS IR SOLN
Status: DC | PRN
  Administered 2021-06-18: 1000 mL

## 2021-06-18 MED ORDER — HYDROMORPHONE HCL 1 MG/ML IJ SOLN
0.5000 mg | INTRAMUSCULAR | Status: DC | PRN
  Administered 2021-06-18 (×2): 0.5 mg via INTRAVENOUS
  Filled 2021-06-18 (×2): qty 1

## 2021-06-18 MED ORDER — ACETAMINOPHEN 500 MG PO TABS
1000.0000 mg | ORAL_TABLET | Freq: Four times a day (QID) | ORAL | Status: AC
Start: 2021-06-18 — End: 2021-06-19
  Administered 2021-06-18 – 2021-06-19 (×4): 1000 mg via ORAL
  Filled 2021-06-18 (×4): qty 2

## 2021-06-18 MED ORDER — HYDROMORPHONE HCL 2 MG/ML IJ SOLN
INTRAMUSCULAR | Status: AC
  Filled 2021-06-18: qty 1

## 2021-06-18 MED ORDER — POTASSIUM CHLORIDE CRYS ER 20 MEQ PO TBCR
20.0000 meq | EXTENDED_RELEASE_TABLET | Freq: Every day | ORAL | Status: DC
  Administered 2021-06-19: 20 meq via ORAL
  Filled 2021-06-18 (×2): qty 1

## 2021-06-18 MED ORDER — CEFAZOLIN (ANCEF) 1 G IV SOLR: 2.0000 g | INTRAVENOUS | Status: DC

## 2021-06-18 MED ORDER — SODIUM CHLORIDE 0.9 % IV SOLN
INTRAVENOUS | Status: DC | PRN
  Administered 2021-06-18: 40 mL

## 2021-06-18 MED ORDER — HYDROCODONE-ACETAMINOPHEN 5-325 MG PO TABS: 1.0000 | ORAL_TABLET | Freq: Four times a day (QID) | ORAL | 0 refills | Status: DC | PRN

## 2021-06-18 MED ORDER — ONDANSETRON HCL 4 MG/2ML IJ SOLN
INTRAMUSCULAR | Status: AC
  Filled 2021-06-18: qty 2

## 2021-06-18 MED ORDER — SODIUM CHLORIDE (PF) 0.9 % IJ SOLN
INTRAMUSCULAR | Status: AC
  Filled 2021-06-18: qty 10

## 2021-06-18 MED ORDER — MAGNESIUM CITRATE PO SOLN: 1.0000 | Freq: Once | ORAL | Status: DC

## 2021-06-18 MED ORDER — ORAL CARE MOUTH RINSE: 15.0000 mL | Freq: Once | OROMUCOSAL | Status: AC

## 2021-06-18 MED ORDER — BUPIVACAINE LIPOSOME 1.3 % IJ SUSP
20.0000 mL | Freq: Once | INTRAMUSCULAR | Status: DC
  Filled 2021-06-18: qty 20

## 2021-06-18 MED ORDER — PROPOFOL 10 MG/ML IV BOLUS
INTRAVENOUS | Status: AC
  Filled 2021-06-18: qty 20

## 2021-06-18 MED ORDER — SODIUM CHLORIDE 0.9 % IR SOLN
Status: DC | PRN
  Administered 2021-06-18: 2000 mL via INTRAVESICAL

## 2021-06-18 MED ORDER — PROPOFOL 10 MG/ML IV BOLUS
INTRAVENOUS | Status: DC | PRN
  Administered 2021-06-18: 150 mg via INTRAVENOUS

## 2021-06-18 MED ORDER — CHLORHEXIDINE GLUCONATE CLOTH 2 % EX PADS
6.0000 | MEDICATED_PAD | Freq: Every day | CUTANEOUS | Status: DC
  Administered 2021-06-19 (×2): 6 via TOPICAL

## 2021-06-18 MED ORDER — LISINOPRIL 20 MG PO TABS
20.0000 mg | ORAL_TABLET | Freq: Every day | ORAL | Status: DC
  Administered 2021-06-18: 20 mg via ORAL
  Filled 2021-06-18: qty 1

## 2021-06-18 MED ORDER — FENTANYL CITRATE (PF) 100 MCG/2ML IJ SOLN
INTRAMUSCULAR | Status: DC | PRN
  Administered 2021-06-18 (×5): 50 ug via INTRAVENOUS

## 2021-06-18 MED ORDER — CEFAZOLIN SODIUM-DEXTROSE 2-4 GM/100ML-% IV SOLN
2.0000 g | Freq: Once | INTRAVENOUS | Status: AC
  Administered 2021-06-18: 2 g via INTRAVENOUS
  Filled 2021-06-18: qty 100

## 2021-06-18 MED ORDER — ONDANSETRON HCL 4 MG/2ML IJ SOLN
INTRAMUSCULAR | Status: DC | PRN
  Administered 2021-06-18: 4 mg via INTRAVENOUS

## 2021-06-18 MED ORDER — LIDOCAINE 2% (20 MG/ML) 5 ML SYRINGE
INTRAMUSCULAR | Status: DC | PRN
  Administered 2021-06-18: 60 mg via INTRAVENOUS

## 2021-06-18 MED ORDER — LIDOCAINE 2% (20 MG/ML) 5 ML SYRINGE
INTRAMUSCULAR | Status: DC | PRN
  Administered 2021-06-18: 1.5 mg/kg/h via INTRAVENOUS

## 2021-06-18 MED ORDER — IOHEXOL 300 MG/ML  SOLN
INTRAMUSCULAR | Status: DC | PRN
  Administered 2021-06-18: 10 mL via URETHRAL

## 2021-06-18 MED ORDER — BACITRACIN-NEOMYCIN-POLYMYXIN 400-5-5000 EX OINT: 1.0000 "application " | TOPICAL_OINTMENT | Freq: Three times a day (TID) | CUTANEOUS | Status: DC | PRN

## 2021-06-18 MED ORDER — SULFAMETHOXAZOLE-TRIMETHOPRIM 800-160 MG PO TABS: 1.0000 | ORAL_TABLET | Freq: Two times a day (BID) | ORAL | 0 refills | Status: DC

## 2021-06-18 MED ORDER — DOCUSATE SODIUM 100 MG PO CAPS: 100.0000 mg | ORAL_CAPSULE | Freq: Two times a day (BID) | ORAL | Status: DC

## 2021-06-18 MED ORDER — DIPHENHYDRAMINE HCL 50 MG/ML IJ SOLN: 12.5000 mg | Freq: Four times a day (QID) | INTRAMUSCULAR | Status: DC | PRN

## 2021-06-18 MED ORDER — ONDANSETRON HCL 4 MG/2ML IJ SOLN
4.0000 mg | INTRAMUSCULAR | Status: DC | PRN
  Administered 2021-06-18: 4 mg via INTRAVENOUS
  Filled 2021-06-18: qty 2

## 2021-06-18 MED ORDER — POTASSIUM CHLORIDE CRYS ER 20 MEQ PO TBCR
40.0000 meq | EXTENDED_RELEASE_TABLET | Freq: Every day | ORAL | Status: DC
  Administered 2021-06-18: 40 meq via ORAL
  Filled 2021-06-18: qty 2

## 2021-06-18 MED ORDER — KETAMINE HCL 10 MG/ML IJ SOLN
INTRAMUSCULAR | Status: AC
  Filled 2021-06-18: qty 1

## 2021-06-18 MED ORDER — MIDAZOLAM HCL 5 MG/5ML IJ SOLN
INTRAMUSCULAR | Status: DC | PRN
  Administered 2021-06-18: 2 mg via INTRAVENOUS

## 2021-06-18 MED ORDER — SUGAMMADEX SODIUM 200 MG/2ML IV SOLN
INTRAVENOUS | Status: DC | PRN
  Administered 2021-06-18: 250 mg via INTRAVENOUS

## 2021-06-18 MED ORDER — SENNOSIDES-DOCUSATE SODIUM 8.6-50 MG PO TABS
2.0000 | ORAL_TABLET | Freq: Every day | ORAL | Status: DC
  Administered 2021-06-18: 2 via ORAL
  Filled 2021-06-18: qty 2

## 2021-06-18 MED ORDER — CHLORHEXIDINE GLUCONATE 0.12 % MT SOLN
15.0000 mL | Freq: Once | OROMUCOSAL | Status: AC
  Administered 2021-06-18: 15 mL via OROMUCOSAL

## 2021-06-18 MED ORDER — AMISULPRIDE (ANTIEMETIC) 5 MG/2ML IV SOLN: 10.0000 mg | Freq: Once | INTRAVENOUS | Status: DC | PRN

## 2021-06-18 MED ORDER — LIDOCAINE 2% (20 MG/ML) 5 ML SYRINGE
INTRAMUSCULAR | Status: AC
  Filled 2021-06-18: qty 10

## 2021-06-18 MED ORDER — STERILE WATER FOR IRRIGATION IR SOLN
Status: DC | PRN
  Administered 2021-06-18: 1000 mL

## 2021-06-18 MED ORDER — SUGAMMADEX SODIUM 500 MG/5ML IV SOLN
INTRAVENOUS | Status: AC
  Filled 2021-06-18: qty 5

## 2021-06-18 MED ORDER — FENTANYL CITRATE PF 50 MCG/ML IJ SOSY: 25.0000 ug | PREFILLED_SYRINGE | INTRAMUSCULAR | Status: DC | PRN

## 2021-06-18 MED ORDER — LIDOCAINE 2% (20 MG/ML) 5 ML SYRINGE
INTRAMUSCULAR | Status: AC
  Filled 2021-06-18: qty 5

## 2021-06-18 MED ORDER — POTASSIUM CHLORIDE CRYS ER 20 MEQ PO TBCR: 20.0000 meq | EXTENDED_RELEASE_TABLET | Freq: Two times a day (BID) | ORAL | Status: DC

## 2021-06-18 MED ORDER — PANTOPRAZOLE SODIUM 40 MG PO TBEC: 40.0000 mg | DELAYED_RELEASE_TABLET | Freq: Every evening | ORAL | Status: DC

## 2021-06-18 MED ORDER — LACTATED RINGERS IV SOLN: INTRAVENOUS | Status: DC

## 2021-06-18 MED ORDER — ROCURONIUM BROMIDE 10 MG/ML (PF) SYRINGE
PREFILLED_SYRINGE | INTRAVENOUS | Status: AC
  Filled 2021-06-18: qty 10

## 2021-06-18 MED ORDER — PHENYLEPHRINE 40 MCG/ML (10ML) SYRINGE FOR IV PUSH (FOR BLOOD PRESSURE SUPPORT)
PREFILLED_SYRINGE | INTRAVENOUS | Status: AC
  Filled 2021-06-18: qty 10

## 2021-06-18 MED ORDER — HYDROMORPHONE HCL 1 MG/ML IJ SOLN
INTRAMUSCULAR | Status: DC | PRN
  Administered 2021-06-18: .5 mg via INTRAVENOUS

## 2021-06-18 MED ORDER — ROCURONIUM BROMIDE 100 MG/10ML IV SOLN
INTRAVENOUS | Status: DC | PRN
Start: 2021-06-18 — End: 2021-06-18
  Administered 2021-06-18 (×2): 10 mg via INTRAVENOUS
  Administered 2021-06-18: 60 mg via INTRAVENOUS
  Administered 2021-06-18: 20 mg via INTRAVENOUS

## 2021-06-18 MED ORDER — MIDAZOLAM HCL 2 MG/2ML IJ SOLN
INTRAMUSCULAR | Status: AC
  Filled 2021-06-18: qty 2

## 2021-06-18 MED ORDER — BELLADONNA ALKALOIDS-OPIUM 16.2-60 MG RE SUPP: 1.0000 | Freq: Four times a day (QID) | RECTAL | Status: DC | PRN

## 2021-06-18 MED ORDER — HYDROCHLOROTHIAZIDE 25 MG PO TABS
12.5000 mg | ORAL_TABLET | Freq: Every day | ORAL | Status: DC
  Administered 2021-06-18 – 2021-06-19 (×2): 12.5 mg via ORAL
  Filled 2021-06-18 (×2): qty 1

## 2021-06-18 MED ORDER — LACTATED RINGERS IV SOLN: INTRAVENOUS | Status: DC | PRN

## 2021-06-18 MED ORDER — DILTIAZEM HCL ER COATED BEADS 180 MG PO CP24
180.0000 mg | ORAL_CAPSULE | Freq: Two times a day (BID) | ORAL | Status: DC
  Administered 2021-06-18 – 2021-06-19 (×2): 180 mg via ORAL
  Filled 2021-06-18 (×2): qty 1

## 2021-06-18 MED ORDER — DEXAMETHASONE SODIUM PHOSPHATE 10 MG/ML IJ SOLN
INTRAMUSCULAR | Status: DC | PRN
  Administered 2021-06-18: 10 mg via INTRAVENOUS

## 2021-06-18 MED ORDER — KETAMINE HCL 10 MG/ML IJ SOLN
INTRAMUSCULAR | Status: DC | PRN
  Administered 2021-06-18 (×5): 10 mg via INTRAVENOUS

## 2021-06-18 MED ORDER — OXYCODONE HCL 5 MG PO TABS
5.0000 mg | ORAL_TABLET | ORAL | Status: DC | PRN
  Administered 2021-06-18 – 2021-06-19 (×3): 5 mg via ORAL
  Filled 2021-06-18 (×3): qty 1

## 2021-06-18 MED ORDER — FENTANYL CITRATE (PF) 250 MCG/5ML IJ SOLN
INTRAMUSCULAR | Status: AC
  Filled 2021-06-18: qty 5

## 2021-06-18 MED ORDER — DEXAMETHASONE SODIUM PHOSPHATE 10 MG/ML IJ SOLN
INTRAMUSCULAR | Status: AC
  Filled 2021-06-18: qty 1

## 2021-06-18 MED ORDER — EPHEDRINE 5 MG/ML INJ
INTRAVENOUS | Status: AC
  Filled 2021-06-18: qty 5

## 2021-06-18 MED ORDER — ACETAMINOPHEN 10 MG/ML IV SOLN: 1000.0000 mg | Freq: Once | INTRAVENOUS | Status: DC | PRN

## 2021-06-18 MED ORDER — TAMSULOSIN HCL 0.4 MG PO CAPS
0.4000 mg | ORAL_CAPSULE | Freq: Every day | ORAL | Status: DC
  Administered 2021-06-19: 0.4 mg via ORAL
  Filled 2021-06-18: qty 1

## 2021-06-18 MED ORDER — DEXTROSE-NACL 5-0.45 % IV SOLN: INTRAVENOUS | Status: DC

## 2021-06-18 MED ORDER — EPHEDRINE SULFATE-NACL 50-0.9 MG/10ML-% IV SOSY
PREFILLED_SYRINGE | INTRAVENOUS | Status: DC | PRN
  Administered 2021-06-18: 10 mg via INTRAVENOUS

## 2021-06-18 MED ORDER — ONDANSETRON HCL 4 MG/2ML IJ SOLN: 4.0000 mg | Freq: Once | INTRAMUSCULAR | Status: DC | PRN

## 2021-06-18 MED ORDER — METOPROLOL SUCCINATE ER 25 MG PO TB24
25.0000 mg | ORAL_TABLET | Freq: Every day | ORAL | Status: DC
  Administered 2021-06-19: 25 mg via ORAL
  Filled 2021-06-18: qty 1

## 2021-06-18 SURGICAL SUPPLY — 99 items
APPLICATOR SURGIFLO ENDO (HEMOSTASIS) IMPLANT
BAG COUNTER SPONGE SURGICOUNT (BAG) IMPLANT
BAG URINE DRAIN 2000ML AR STRL (UROLOGICAL SUPPLIES) ×3 IMPLANT
BAG URO CATCHER STRL LF (MISCELLANEOUS) ×3 IMPLANT
BASKET ZERO TIP NITINOL 2.4FR (BASKET) IMPLANT
CATH FOLEY 3WAY  5CC 18FR (CATHETERS)
CATH FOLEY 3WAY 30CC 22FR (CATHETERS) ×3 IMPLANT
CATH FOLEY 3WAY 5CC 18FR (CATHETERS) IMPLANT
CATH INTERMIT  6FR 70CM (CATHETERS) ×3 IMPLANT
CHLORAPREP W/TINT 26 (MISCELLANEOUS) ×3 IMPLANT
CLIP LIGATING HEMO LOK XL GOLD (MISCELLANEOUS) ×3 IMPLANT
CLIP LIGATING HEMO O LOK GREEN (MISCELLANEOUS) ×6 IMPLANT
CLOTH BEACON ORANGE TIMEOUT ST (SAFETY) ×3 IMPLANT
COVER SURGICAL LIGHT HANDLE (MISCELLANEOUS) ×3 IMPLANT
COVER TIP SHEARS 8 DVNC (MISCELLANEOUS) ×2 IMPLANT
COVER TIP SHEARS 8MM DA VINCI (MISCELLANEOUS) ×1
DECANTER SPIKE VIAL GLASS SM (MISCELLANEOUS) ×3 IMPLANT
DERMABOND ADVANCED (GAUZE/BANDAGES/DRESSINGS) ×1
DERMABOND ADVANCED .7 DNX12 (GAUZE/BANDAGES/DRESSINGS) ×2 IMPLANT
DRAIN CHANNEL 15F RND FF 3/16 (WOUND CARE) ×3 IMPLANT
DRAIN CHANNEL RND F F (WOUND CARE) IMPLANT
DRAPE ARM DVNC X/XI (DISPOSABLE) ×8 IMPLANT
DRAPE C-ARM 42X120 X-RAY (DRAPES) ×3 IMPLANT
DRAPE COLUMN DVNC XI (DISPOSABLE) ×2 IMPLANT
DRAPE DA VINCI XI ARM (DISPOSABLE) ×4
DRAPE DA VINCI XI COLUMN (DISPOSABLE) ×1
DRAPE INCISE IOBAN 66X45 STRL (DRAPES) ×3 IMPLANT
DRAPE SHEET LG 3/4 BI-LAMINATE (DRAPES) ×3 IMPLANT
DRAPE SURG IRRIG POUCH 19X23 (DRAPES) ×3 IMPLANT
DRSG TEGADERM 4X4.75 (GAUZE/BANDAGES/DRESSINGS) ×3 IMPLANT
ELECT REM PT RETURN 15FT ADLT (MISCELLANEOUS) ×3 IMPLANT
EVACUATOR SILICONE 100CC (DRAIN) ×3 IMPLANT
GAUZE SPONGE 4X4 12PLY STRL (GAUZE/BANDAGES/DRESSINGS) ×3 IMPLANT
GLOVE SURG ENC MOIS LTX SZ6.5 (GLOVE) ×3 IMPLANT
GLOVE SURG ENC TEXT LTX SZ7.5 (GLOVE) ×15 IMPLANT
GLOVE SURG POLYISO LF SZ6.5 (GLOVE) ×6 IMPLANT
GLOVE SURG UNDER POLY LF SZ6.5 (GLOVE) ×6 IMPLANT
GLOVE SURG UNDER POLY LF SZ7 (GLOVE) ×3 IMPLANT
GOWN STRL REUS W/TWL LRG LVL3 (GOWN DISPOSABLE) ×21 IMPLANT
GUIDEWIRE ANG ZIPWIRE 038X150 (WIRE) ×3 IMPLANT
GUIDEWIRE STR DUAL SENSOR (WIRE) IMPLANT
HOLDER FOLEY CATH W/STRAP (MISCELLANEOUS) ×3 IMPLANT
IRRIG SUCT STRYKERFLOW 2 WTIP (MISCELLANEOUS) ×3
IRRIGATION SUCT STRKRFLW 2 WTP (MISCELLANEOUS) ×2 IMPLANT
IV LACTATED RINGERS 1000ML (IV SOLUTION) ×3 IMPLANT
IV NS 1000ML (IV SOLUTION) ×2
IV NS 1000ML BAXH (IV SOLUTION) ×4 IMPLANT
KIT BASIN OR (CUSTOM PROCEDURE TRAY) ×3 IMPLANT
KIT TURNOVER KIT A (KITS) ×3 IMPLANT
LOOP VESSEL MAXI BLUE (MISCELLANEOUS) ×3 IMPLANT
MANIFOLD NEPTUNE II (INSTRUMENTS) ×3 IMPLANT
NDL SAFETY ECLIPSE 18X1.5 (NEEDLE) ×2 IMPLANT
NEEDLE HYPO 18GX1.5 SHARP (NEEDLE) ×1
NEEDLE INSUFFLATION 14GA 120MM (NEEDLE) ×3 IMPLANT
NS IRRIG 1000ML POUR BTL (IV SOLUTION) IMPLANT
PACK CYSTO (CUSTOM PROCEDURE TRAY) ×3 IMPLANT
PAD POSITIONING PINK XL (MISCELLANEOUS) IMPLANT
PENCIL SMOKE EVACUATOR (MISCELLANEOUS) IMPLANT
PLUG CATH AND CAP STER (CATHETERS) ×3 IMPLANT
PORT ACCESS TROCAR AIRSEAL 12 (TROCAR) ×2 IMPLANT
PORT ACCESS TROCAR AIRSEAL 5M (TROCAR) ×1
POUCH SPECIMEN RETRIEVAL 10MM (ENDOMECHANICALS) IMPLANT
SEAL CANN UNIV 5-8 DVNC XI (MISCELLANEOUS) ×8 IMPLANT
SEAL XI 5MM-8MM UNIVERSAL (MISCELLANEOUS) ×4
SET IRRIG Y TYPE TUR BLADDER L (SET/KITS/TRAYS/PACK) ×3 IMPLANT
SET TRI-LUMEN FLTR TB AIRSEAL (TUBING) ×3 IMPLANT
SHEET LAVH (DRAPES) ×3 IMPLANT
SOLUTION ELECTROLUBE (MISCELLANEOUS) ×3 IMPLANT
SPONGE T-LAP 4X18 ~~LOC~~+RFID (SPONGE) ×3 IMPLANT
STENT URET 6FRX26 CONTOUR (STENTS) ×3 IMPLANT
SURGIFLO W/THROMBIN 8M KIT (HEMOSTASIS) IMPLANT
SUT ETHILON 3 0 FSL (SUTURE) ×3 IMPLANT
SUT MNCRL AB 4-0 PS2 18 (SUTURE) ×6 IMPLANT
SUT PDS AB 0 CT1 36 (SUTURE) IMPLANT
SUT PDS AB 1 CTX 36 (SUTURE) IMPLANT
SUT PDS AB 2-0 CT2 27 (SUTURE) ×6 IMPLANT
SUT VIC AB 0 CT1 27 (SUTURE)
SUT VIC AB 0 CT1 27XBRD ANTBC (SUTURE) IMPLANT
SUT VIC AB 1-0 CT2 27 (SUTURE) ×6 IMPLANT
SUT VIC AB 2-0 CT2 27 (SUTURE) IMPLANT
SUT VIC AB 2-0 SH 27 (SUTURE)
SUT VIC AB 2-0 SH 27X BRD (SUTURE) IMPLANT
SUT VIC AB 3-0 SH 27 (SUTURE)
SUT VIC AB 3-0 SH 27X BRD (SUTURE) IMPLANT
SUT VIC AB 4-0 RB1 18 (SUTURE) IMPLANT
SUT VIC AB 4-0 RB1 27 (SUTURE) ×4
SUT VIC AB 4-0 RB1 27XBRD (SUTURE) ×8 IMPLANT
SUT VIC AB 4-0 SH 18 (SUTURE) IMPLANT
SUT VICRYL 0 UR6 27IN ABS (SUTURE) IMPLANT
SUT VLOC 180 2-0 6IN GS21 (SUTURE) IMPLANT
SUT VLOC BARB 180 ABS3/0GR12 (SUTURE)
SUTURE VLOC BRB 180 ABS3/0GR12 (SUTURE) IMPLANT
TOWEL OR 17X26 10 PK STRL BLUE (TOWEL DISPOSABLE) ×3 IMPLANT
TRAY LAPAROSCOPIC (CUSTOM PROCEDURE TRAY) ×3 IMPLANT
TROCAR XCEL 12X100 BLDLESS (ENDOMECHANICALS) ×3 IMPLANT
TROCAR XCEL NON-BLD 5MMX100MML (ENDOMECHANICALS) ×3 IMPLANT
TUBING CONNECTING 10 (TUBING) ×3 IMPLANT
TUBING UROLOGY SET (TUBING) IMPLANT
WATER STERILE IRR 1000ML POUR (IV SOLUTION) ×3 IMPLANT

## 2021-06-18 NOTE — Op Note (Signed)
NAMEJOMEL, PAPWORTH MEDICAL RECORD NO: [id number] ACCOUNT NO: [id number] DATE OF BIRTH: 04/18/1956 FACILITY: [facility] LOCATION: [id number] PHYSICIAN: Alexis Frock, MD  Operative Report   DATE OF PROCEDURE: 06/18/2021  PREOPERATIVE DIAGNOSIS:  Left ureteral stricture.  PROCEDURES PERFORMED: 1.  Cystoscopy with left retrograde pyelogram interpretation. 2.  Exchange left ureteral stent 6 x 26 contour. 3.  Robotic-assisted laparoscopic left ureteral reimplant with psoas hitch.  ESTIMATED BLOOD LOSS:  [id number] mL  COMPLICATIONS:  None.  SPECIMENS:  None.  FINDINGS:   1.  High-grade left distal ureteral stricture as expected below the iliac vessels. 2.  Successful replacement of left ureteral stent proximal end in the renal pelvis, distal end in the urinary bladder. 3.  Successful reimplantation of the left ureter into left bladder dome with psoas hitch.  ASSISTANT:  Debbrah Alar, PA  DRAINS:   1.  Jackson-Pratt drain to bulb suction. 2.  Foley catheter to straight drain.  INDICATIONS:  The patient is a very pleasant 65 year old man with longstanding history of severe lumbar spine disease.  He is status post multiple procedures for this and was noted to have new significant hydronephrosis of the distal ureter after most  recent surgery.  This is concerning for possible left distal ureteral obstruction.  He underwent evaluation with axial imaging and endoscopic examination with cystoscopy, retrograde ureteroscopy and he was found to have significant, but incomplete left  distal stricture with tethering of the ureter medially below the iliac vessels.  This was not amenable to endoscopic management. Options were discussed including chronic stenting versus definitive management with ureteral reconstruction with  reimplantation being most favored given the location of the stricture, he wished to proceed.  Informed consent was then placed in medical record.  PROCEDURE IN DETAIL:  The patient  being verified, procedure being cystoscopy, left stent change and a left ureteral reimplant was confirmed.  Procedure timeout was performed.  Intravenous antibiotics administered.  General anesthesia induced. The patient  was placed into the low lithotomy position.  The arms were tucked to the side with gel rolls.  He was further fashioned on the operating table using 3 inch tape with foam padding across the supraxiphoid chest.  A test of steep Trendelenburg positioning  was performed and was found to be suitably positioned.  He was first clipper shaving and then prepped the patient's infraxiphoid abdomen using chlorhexidine gluconate, his penis, perineum and proximal thigh using iodine.  Attention was then directed to the  stent exchange.  Cystourethroscopy was performed using 21-French rigid cystoscope with offset lens placed in the anterior and posterior urethra was unremarkable.  Inspection of bladder revealed some mild trabeculation.  The distal end of the stent was  seen in situ and the left side was grasped and brought to the level of the urethral meatus and 0.038 ZIPwire was advanced in his lower pole exchanged for an open-ended catheter and a left retrograde pyelogram was obtained.  Left retrograde pyelogram demonstrated a single system left ureter and single system left kidney.  There was persistent tethering at the left distal ureter medially with hydronephrosis above this.  ZIPwire was once again advanced a new 6 x 26 contour  type stent was placed using cystoscopic and fluoroscopic guidance and good proximal and distal end were noted.  A 3-way type Foley catheter was placed per urethra to straight drain.  Irrigation port plugged.  10 mL of water in the balloon.  Next, a high  flow low pressure pneumoperitoneum was obtained using a  Veress technique in the supraumbilical midline and passed the aspiration drop test.  An 8 mm robotic camera port was then placed in same location.  Laparoscopic  examination of the peritoneal cavity  revealed no significant adhesions.  No visceral injury.  Additional ports were placed as follows:  Right paramedian 8 mm robotic port, right far lateral 12 mm AirSeal assistant port, right paramedian 5 mm suction port, a left paramedian 8 mm robotic  port, left far lateral 8 mm robotic port.  Robot was docked and passed the electronic checks.  Attention was turned to development of the left retroperitoneum.  Incision was made in the lateral descending colon from the iliac vessels superiorly with  distance approximately 10 cm and inferiorly approximately 6 cm inferior to the iliac vessels and the colon was carefully swept medially.  This created a retroperitoneal flap.  Attention was directed to the identification of left ureter.  Ureter was  encountered as it coursed over the iliac vessels as expected.  The stent was visible within this.  This was marked with a vessel loop, dissected proximally at distance approximately 6 cm the area of the iliac crossing and then distally approximately 3 cm  below the area of the iliacs.  At this portion, there was significant desmoplastic reaction around the ureter and we began to see the ureter course medially indicating the site of a significant stricture.  As the goal today was reimplantation, this was felt to  be adequate for the distal end of dissection.  Attention was directed at mobilization of the bladder.  The space of Retzius was developed by incising lateral to the medial umbilical ligaments bilaterally, dropping the bladder away from the anterior  abdominal wall down to the endopelvic fascia.  Left vas deferens was encountered and purposely ligated.  The bladder was then filled to 600 mL and the ureter was of sufficient length that a simple reimplantation would not reach; however, a psoas hitch  would easily allow for a tension-free reimplant.  The bladder was then drained.  The ureter was then transected at the distal  aspect of his dissected area with the distal stump being controlled with extra-large Hem-o-Lok clip. Purposeful care was taken  to avoid transection of the ureteral stent, which at this point was still a 15 cm, ureter was tucked medially.  The psoas area was identified as was the psoas tendon and two stay sutures of 0 Vicryl were placed into this parallel to the psoas tendon  within the mid section of it and perivesical tissue was then hitched to this.  This tethering the left bladder towards the area of the left upper abdomen.  The bladder was then refilled to 400 mL and the ureter was laid down to position and again was  minimal with anastomosis without tension whatsoever.  I was quite happy with this. The ureter was spatulated for a distance of about  1.2 cm, heel stitch of 4-0 Vicryl was applied.  A small cystotomy was made approximately 5 mm in length and heel stitch was anchored into this.  Stent was placed. Next,  further ureteral to bladder anastomosis was performed using 2 separate running suture lines of 4-0 Vicryl, resulting in an excellent tension-free apposition.  He was quite happy with the geometry of this.  The left lateral most robotic arm was removed.   A closed suction drain was placed and the pneumoperitoneum was reduced after the previous right lateral most 12 mm port site was closed at  the fascia using a Carter-Thomason suture passer and 0 Vicryl.  All incision sites were infiltrated with dilute  lipolyzed Marcaine and closed the skin and subcuticular Monocryl and Dermabond.  Procedure was terminated.  The patient tolerated the procedure well.  No immediate complications.  The patient was taken to postanesthesia care in stable condition.  Please note first assistant Debbrah Alar PA was crucial for intraoperative retraction, suctioning, vascular clipping, intracorporeal suture passage, and general first assistance.   PUS D: 06/18/2021 10:57:14 am T: 06/18/2021 4:48:00 pm  JOB:  [id number]

## 2021-06-18 NOTE — H&P (Signed)
[name] is an 65 y.o. male.    Chief Complaint: Pre-Op LEFT Ureteral Reimplant  HPI:  1 - Left Ureteral Obstruction / Stricture  - high grade partial left distal ureteral obstruction in area of low L spine after necessary ureteral mobilization as part of anterior approach spine surgery 2022. Ureteroscopy conffirms high grade tethereing / kinking of ureter over lower L spine and JJ stent placed.   PMH sig for L spine surgery x 4 (3 posterior, one anterior), HTN (sees [place] cards, no ischemic disease), mild obesity. He is related to Dr. Barry Dienes in Gen Surg by marriage. His PCP is Beverely Low MD.   Today "[name]" is seen to proceed with LEFT robotic reimplant / possible psoas hitch. No interval fevers.   Past Medical History:  Diagnosis Date   Acquired deafness, right    per pt completed deafness right ear since age 65   Allergic rhinitis, seasonal    Anticoagulant long-term use    eliquis--- managed by cardiology   BPH associated with nocturia    urologist--- dr Tresa Moore   Chronic low back pain    DDD (degenerative disc disease), lumbosacral    Foraminal stenosis of lumbosacral region    GERD (gastroesophageal reflux disease)    Heart murmur    History of kidney stones    History of skin cancer    Hydronephrosis, left    Hyperlipidemia, mixed    Hypertension, essential    followed by pcp and cardiology   Hypokalemia    OA (osteoarthritis)    Paroxysmal atrial fibrillation ([place]) 01/28/2017   cardiologist--- dr Nehemiah Massed;   pt had ETT 03-16-2017 per cardio note normal, event monitor 02-09-2017 showed baseline SR some peroids  Afib with controlled rate with occ PAC/ PVC;  per pt had echo many yrs ago prior to AFib    Past Surgical History:  Procedure Laterality Date   ABDOMINAL EXPOSURE N/A 01/18/2021   Procedure: ABDOMINAL EXPOSURE;  Surgeon: [name], MD;  Location: [facility];  Service: Vascular;  Laterality: N/A;   ANTERIOR LUMBAR FUSION N/A 01/18/2021    Procedure: Anterior Lumbar Interbody Fusion - Lumbar Five-Sarcal One;  Surgeon: Kary Kos, MD;  Location: [facility];  Service: Neurosurgery;  Laterality: N/A;  Anterior Lumbar Interbody Fusion - Lumbar Five-Sacral One   CYSTOSCOPY WITH RETROGRADE PYELOGRAM, URETEROSCOPY AND STENT PLACEMENT Left 04/21/2021   Procedure: CYSTOSCOPY WITH RETROGRADE PYELOGRAM, DIAGNOSTICURETEROSCOPY AND STENT PLACEMENT;  Surgeon: Alexis Frock, MD;  Location: [facility];  Service: Urology;  Laterality: Left;  1 HR   EXTRACORPOREAL SHOCK WAVE LITHOTRIPSY     approx 2002   LUMBAR [place] SURGERY  2018   REPAIR DURAL / CSF LEAK  2018   post op lumbar diskectomy   REVISION TOTAL KNEE ARTHROPLASTY Left 2015   TOTAL KNEE ARTHROPLASTY Left 2012   TRANSFORAMINAL LUMBAR INTERBODY FUSION (TLIF) WITH PEDICLE SCREW FIXATION 1 LEVEL N/A 06/12/2019   Procedure: LUMBAR FOUR-FIVE TRANSFORAMINAL LUMBAR INTERBODY FUSION (TLIF) WITH PEDICLE SCREW FIXATION;  Surgeon: Kary Kos, MD;  Location: [facility];  Service: Neurosurgery;  Laterality: N/A;   UMBILICAL HERNIA REPAIR  1992    Family History  Problem Relation Age of Onset   Cancer Mother    Cancer Father    Cancer Brother    Hyperlipidemia Brother    Social History:  reports that he has never smoked. He quit smokeless tobacco use about 14 months ago.  His smokeless tobacco use included chew. He reports current alcohol  use. He reports that he does not use drugs.  Allergies:  Allergies  Allergen Reactions   Meloxicam Palpitations    Medications Prior to Admission  Medication Sig Dispense Refill   apixaban (ELIQUIS) 5 MG TABS tablet Take 5 mg by mouth 2 (two) times daily.     celecoxib (CELEBREX) 200 MG capsule Take 1 capsule (200 mg total) by mouth daily. 30 capsule 5   fexofenadine (ALLEGRA) 180 MG tablet Take 1 tablet (180 mg total) by mouth daily. 90 tablet 1   fluticasone (FLONASE) 50 MCG/ACT nasal spray Use 1 spray(s) in each nostril once daily (Patient taking  differently: Place 1 spray into both nostrils daily as needed for allergies.) 16 g 1   hydrochlorothiazide (HYDRODIURIL) 12.5 MG tablet Take 1 tablet (12.5 mg total) by mouth daily. 90 tablet 1   lisinopril (ZESTRIL) 20 MG tablet Take 1 tablet (20 mg total) by mouth every evening. (Patient taking differently: Take 20 mg by mouth at bedtime.) 90 tablet 1   montelukast (SINGULAIR) 10 MG tablet Take 1 tablet (10 mg total) by mouth at bedtime. 90 tablet 1   Multiple Vitamins-Minerals (MULTIVITAMIN WITH MINERALS) tablet Take 1 tablet by mouth daily.     Olopatadine HCl 0.2 % SOLN Place 1 drop into both eyes 2 (two) times daily as needed (allergies.).     pantoprazole (PROTONIX) 40 MG tablet Take 1 tablet (40 mg total) by mouth daily. (Patient taking differently: Take 40 mg by mouth every evening.) 90 tablet 1   Phenazopyridine HCl (AZO URINARY PAIN PO) Take 1 capsule by mouth daily.     potassium chloride SA (KLOR-CON) 20 MEQ tablet Take 3 tablets (60 mEq total) by mouth daily. (Patient taking differently: Take 20-40 mEq by mouth 2 (two) times daily. Takes one tablet in am and two tabs in pm) 270 tablet 1   tadalafil (CIALIS) 5 MG tablet Take 1-2 tablets (5-10 mg total) by mouth daily as needed for erectile dysfunction. 30 tablet 11   tamsulosin (FLOMAX) 0.4 MG CAPS capsule TAKE ONE (1) CAPSULE EACH DAY. (Patient taking differently: Take 0.4 mg by mouth daily.) 90 capsule 3   diltiazem (CARDIZEM CD) 180 MG 24 hr capsule Take 1 capsule (180 mg total) by mouth 2 (two) times daily. 60 capsule 1   methocarbamol (ROBAXIN) 500 MG tablet Take 1 tablet (500 mg total) by mouth 4 (four) times daily. 45 tablet 0   metoprolol succinate (TOPROL-XL) 25 MG 24 hr tablet Take 1 tablet (25 mg total) by mouth daily. 30 tablet 3   senna-docusate (SENOKOT-S) 8.6-50 MG tablet Take 1 tablet by mouth 2 (two) times daily. While taking strong pain meds to prevent constipation 10 tablet 0    No results found for this or any  previous visit (from the past 48 hour(s)). No results found.  Review of Systems  Constitutional:  Negative for chills and fever.  All other systems reviewed and are negative.  There were no vitals taken for this visit. Physical Exam Vitals reviewed.  HENT:     Head: Normocephalic.     Nose: Nose normal.  Eyes:     Pupils: Pupils are equal, round, and reactive to light.  Cardiovascular:     Rate and Rhythm: Normal rate.  Pulmonary:     Effort: Pulmonary effort is normal.  Abdominal:     Comments: Stable truncal obesity, prior scars w/o hernias.   Genitourinary:    Comments: Left neph tube in place with non-foul urine.  Musculoskeletal:        General: Normal range of motion.  Neurological:     General: No focal deficit present.     Mental Status: He is alert.  Psychiatric:        Mood and Affect: Mood normal.     Assessment/Plan  Proceed with LEFT robotic ureteral reimplant as planned. Risks, benefits, alternatives, expected peri-op course discussed previously and reiterated today.   Alexis Frock, MD 06/18/2021, 5:23 AM

## 2021-06-18 NOTE — Transfer of Care (Signed)
Immediate Anesthesia Transfer of Care Note  Patient: [name]  Procedure(s) Performed: XI ROBOTICALLY ASSISTED LAPAROSCOPIC URETERAL RE-IMPLANTATION WITH PSOAS HITCH (Left: Ureter) CYSTOSCOPY WITH RETROGRADE PYELOGRAM/URETERAL STENT EXCHANGE (Left: Bladder)  Patient Location: PACU  Anesthesia Type:General  Level of Consciousness: drowsy and patient cooperative  Airway & Oxygen Therapy: Patient Spontanous Breathing and Patient connected to face mask oxygen  Post-op Assessment: Report given to RN and Post -op Vital signs reviewed and stable  Post vital signs: Reviewed and stable  Last Vitals:  Vitals Value Taken Time  BP 142/79 06/18/21 1100  Temp    Pulse 80 06/18/21 1101  Resp 19 06/18/21 1101  SpO2 96 % 06/18/21 1101  Vitals shown include unvalidated device data.  Last Pain:  Vitals:   06/18/21 0552  TempSrc: Oral  PainSc:          Complications: No notable events documented.

## 2021-06-18 NOTE — Anesthesia Postprocedure Evaluation (Signed)
Anesthesia Post Note  Patient: [name]  Procedure(s) Performed: XI ROBOTICALLY ASSISTED LAPAROSCOPIC URETERAL RE-IMPLANTATION WITH PSOAS HITCH (Left: Ureter) CYSTOSCOPY WITH RETROGRADE PYELOGRAM/URETERAL STENT EXCHANGE (Left: Bladder)     Patient location during evaluation: PACU Anesthesia Type: General Level of consciousness: awake Pain management: pain level controlled Vital Signs Assessment: post-procedure vital signs reviewed and stable Respiratory status: spontaneous breathing, nonlabored ventilation, respiratory function stable and patient connected to nasal cannula oxygen Cardiovascular status: blood pressure returned to baseline and stable Postop Assessment: no apparent nausea or vomiting Anesthetic complications: no   No notable events documented.  Last Vitals:  Vitals:   06/18/21 1230 06/18/21 1245  BP: (!) 166/95 (!) 167/98  Pulse: 78 73  Resp: 20 12  Temp:  36.7 C  SpO2: 98% 99%    Last Pain:  Vitals:   06/18/21 1245  TempSrc:   PainSc: 4                  Latiesha Harada P Brittony Billick

## 2021-06-18 NOTE — Progress Notes (Signed)
Received report from I. Oraegbunam,RN.  No change in assessment. Continue plan of care. Romie Tay, Laurel Dimmer, RN

## 2021-06-18 NOTE — Discharge Instructions (Addendum)
1- Drain Sites - You may have some mild persistent drainage from old drain site for several days, this is normal. This can be covered with cotton gauze for convenience.  2 - Stiches - Your stitches are all dissolvable. You may notice a "loose thread" at your incisions, these are normal and require no intervention. You may cut them flush to the skin with fingernail clippers if needed for comfort.  3 - Diet - No restrictions  4 - Activity - No heavy lifting / straining (any activities that require valsalva or "bearing down") x 4 weeks. Otherwise, no restrictions.  5 - Bathing - You may shower immediately. Do not take a bath or get into swimming pool where incision sites are submersed in water x 4 weeks.   6 - Catheter - Will remain in place until removed at your next appointment. It may be cleaned with soap and water in the shower. It may be disconnected from the drain bag while in the shower to avoid tripping over the tube. You may apply Neosporin or Vaseline ointment as needed to the tip of the penis where the catheter inserts to reduce friction and irritation in this spot.   7 - When to Call the Doctor - Call MD for any fever >102, any acute wound problems, or any severe nausea / vomiting. You can call the [facility] [phone]) 24 hours a day 365 days a year. It will roll-over to the answering service and on-call physician after hours.   You may resume Eliquis on 06/21/21 and aspirin, advil, aleve, vitamins, and supplements 7 days after surgery.

## 2021-06-18 NOTE — Brief Op Note (Signed)
06/18/2021  10:48 AM  PATIENT:  [name]  65 y.o. male  PRE-OPERATIVE DIAGNOSIS:  LEFT URETERAL STRICTURE  POST-OPERATIVE DIAGNOSIS:  LEFT URETERAL STRICTURE  PROCEDURE:  Procedure(s) with comments: XI ROBOTICALLY ASSISTED LAPAROSCOPIC URETERAL RE-IMPLANTATION WITH PSOAS HITCH (Left) - 3 HRS CYSTOSCOPY WITH RETROGRADE PYELOGRAM/URETERAL STENT EXCHANGE (Left)  SURGEON:  Surgeon(s) and Role:    * Alexis Frock, MD - Primary  PHYSICIAN ASSISTANT:   ASSISTANTS: Clemetine Marker PA   ANESTHESIA:   local and general  EBL:  150 mL   BLOOD ADMINISTERED:none  DRAINS:  1 - JP to bulb; 2 - Foley to gravity    LOCAL MEDICATIONS USED:  MARCAINE     SPECIMEN:  No Specimen  DISPOSITION OF SPECIMEN:  N/A  COUNTS:  YES  TOURNIQUET:  * No tourniquets in log *  DICTATION: .Other Dictation: Dictation Number [id number]  PLAN OF CARE: Admit for overnight observation  PATIENT DISPOSITION:  PACU - hemodynamically stable.   Delay start of Pharmacological VTE agent (>24hrs) due to surgical blood loss or risk of bleeding: yes

## 2021-06-18 NOTE — Anesthesia Procedure Notes (Signed)
Procedure Name: Intubation Date/Time: 06/18/2021 7:35 AM Performed by: Gwyndolyn Saxon, CRNA Pre-anesthesia Checklist: Patient identified, Emergency Drugs available, Suction available and Patient being monitored Patient Re-evaluated:Patient Re-evaluated prior to induction Oxygen Delivery Method: Circle system utilized Preoxygenation: Pre-oxygenation with 100% oxygen Induction Type: IV induction Ventilation: Mask ventilation without difficulty Laryngoscope Size: Miller and 3 Grade View: Grade I Tube type: Oral Tube size: 8.0 mm Number of attempts: 1 Airway Equipment and Method: Patient positioned with wedge pillow and Stylet Placement Confirmation: ETT inserted through vocal cords under direct vision, positive ETCO2 and breath sounds checked- equal and bilateral Secured at: 23 cm Tube secured with: Tape Dental Injury: Teeth and Oropharynx as per pre-operative assessment

## 2021-06-19 ENCOUNTER — Encounter (HOSPITAL_COMMUNITY): Payer: Self-pay | Admitting: Urology

## 2021-06-19 LAB — HEMOGLOBIN AND HEMATOCRIT, BLOOD
HCT: 37 % — ABNORMAL LOW (ref 39.0–52.0)
Hemoglobin: 11.4 g/dL — ABNORMAL LOW (ref 13.0–17.0)

## 2021-06-19 LAB — BASIC METABOLIC PANEL
Anion gap: 7 (ref 5–15)
BUN: 11 mg/dL (ref 8–23)
CO2: 26 mmol/L (ref 22–32)
Calcium: 8.8 mg/dL — ABNORMAL LOW (ref 8.9–10.3)
Chloride: 108 mmol/L (ref 98–111)
Creatinine, Ser: 0.84 mg/dL (ref 0.61–1.24)
GFR, Estimated: >60 mL/min (ref >60)
Glucose, Bld: 161 mg/dL — ABNORMAL HIGH (ref 70–99)
Potassium: 4 mmol/L (ref 3.5–5.1)
Sodium: 141 mmol/L (ref 135–145)

## 2021-06-19 MED ORDER — CHLORPROMAZINE HCL 25 MG PO TABS
25.0000 mg | ORAL_TABLET | Freq: Three times a day (TID) | ORAL | Status: DC | PRN
  Administered 2021-06-19: 25 mg via ORAL
  Filled 2021-06-19 (×3): qty 1

## 2021-06-19 MED ORDER — CHLORPROMAZINE HCL 25 MG PO TABS: 25.0000 mg | ORAL_TABLET | Freq: Three times a day (TID) | ORAL | 0 refills | Status: DC | PRN

## 2021-06-19 NOTE — Plan of Care (Signed)
  Problem: Education: Goal: Knowledge of General Education information will improve Description: Including pain rating scale, medication(s)/side effects and non-pharmacologic comfort measures Outcome: Progressing   Problem: Activity: Goal: Risk for activity intolerance will decrease Outcome: Progressing   Problem: Coping: Goal: Level of anxiety will decrease Outcome: Progressing   Problem: Elimination: Goal: Will not experience complications related to urinary retention Outcome: Progressing   Problem: Pain Managment: Goal: General experience of comfort will improve Outcome: Progressing   Problem: Safety: Goal: Ability to remain free from injury will improve Outcome: Progressing   Problem: Skin Integrity: Goal: Risk for impaired skin integrity will decrease Outcome: Progressing   Problem: Skin Integrity: Goal: Demonstration of wound healing without infection will improve Outcome: Progressing

## 2021-06-19 NOTE — Progress Notes (Signed)
Extensive leg bag and foley care teaching with pt and wife. Ready for d/c home

## 2021-06-19 NOTE — Progress Notes (Signed)
Patient was able to get up and ambulate in the hall twice during the evening shift, and tolerated it fairly well.

## 2021-06-19 NOTE — Discharge Summary (Addendum)
[facility] Discharge Summary  Admit date: 06/18/2021  Discharge date and time: 06/19/21   Discharge to: Home  Discharge Service: Urology  Discharge Attending Physician:  Dr. Rexene Alberts  Discharge  Diagnoses: Left ureteral stricture  Secondary Diagnosis: Active Problems:   Ureteral stricture   OR Procedures: Procedure(s): XI ROBOTICALLY ASSISTED LAPAROSCOPIC URETERAL RE-IMPLANTATION WITH PSOAS HITCH CYSTOSCOPY WITH RETROGRADE PYELOGRAM/URETERAL STENT EXCHANGE 06/18/2021   Ancillary Procedures: None   Discharge Day Services: The patient was seen and examined by the Urology team both in the morning and immediately prior to discharge.  Vital signs and laboratory values were stable and within normal limits.  The physical exam was benign and unchanged and all surgical wounds were examined.  Discharge instructions were explained and all questions answered.  Subjective  No acute events overnight. Pain Controlled. No fever or chills.  Objective Patient Vitals for the past 8 hrs:  BP Temp Temp src Pulse Resp SpO2  06/19/21 0757 (!) 150/90 -- -- 64 -- --  06/19/21 0531 (!) 158/85 98.6 F (37 C) Oral 63 18 94 %   No intake/output data recorded.  General Appearance:        No acute distress Lungs:                       Normal work of breathing on room air Heart:                                Regular rate and rhythm Abdomen:                         Soft, non-tender, non-distended. Incisions c/d/I. Left JP site covered with dressing. GU:                               Foley catheter in place draining clear light pink tinged urine. Extremities:                      Warm and well perfused   Hospital Course:  The patient underwent left robotic ureteral reimplant w/ psoas hitch on 06/18/2021.  The patient tolerated the procedure well, was extubated in the OR, and afterwards was taken to the PACU for routine post-surgical care. When stable the patient was transferred to the floor.    The patient did well postoperatively.  The patient's diet was slowly advanced and at the time of discharge was tolerating a regular diet. His JP was removed prior to discharge. The patient was discharged home 1 Day Post-Op, at which point was tolerating a regular solid diet, was instructed how to care for his Foley catheter, have adequate pain control with P.O. pain medication, and could ambulate without difficulty. The patient will follow up with [place] for post op check.   Condition at Discharge: Improved  Discharge Medications:  Allergies as of 06/19/2021       Reactions   Meloxicam Palpitations        Medication List     STOP taking these medications    apixaban 5 MG Tabs tablet Commonly known as: ELIQUIS   celecoxib 200 MG capsule Commonly known as: CELEBREX   multivitamin with minerals tablet       TAKE these medications    AZO URINARY PAIN PO Take 1 capsule by mouth daily.  diltiazem 180 MG 24 hr capsule Commonly known as: CARDIZEM CD Take 1 capsule (180 mg total) by mouth 2 (two) times daily.   docusate sodium 100 MG capsule Commonly known as: COLACE Take 1 capsule (100 mg total) by mouth 2 (two) times daily.   fexofenadine 180 MG tablet Commonly known as: ALLEGRA Take 1 tablet (180 mg total) by mouth daily.   fluticasone 50 MCG/ACT nasal spray Commonly known as: FLONASE Use 1 spray(s) in each nostril once daily What changed:  how much to take how to take this when to take this reasons to take this additional instructions   hydrochlorothiazide 12.5 MG tablet Commonly known as: HYDRODIURIL Take 1 tablet (12.5 mg total) by mouth daily.   HYDROcodone-acetaminophen 5-325 MG tablet Commonly known as: Norco Take 1-2 tablets by mouth every 6 (six) hours as needed for moderate pain.   lisinopril 20 MG tablet Commonly known as: ZESTRIL Take 1 tablet (20 mg total) by mouth every evening. What changed: when to take this   methocarbamol 500 MG  tablet Commonly known as: Robaxin Take 1 tablet (500 mg total) by mouth 4 (four) times daily.   metoprolol succinate 25 MG 24 hr tablet Commonly known as: TOPROL-XL Take 1 tablet (25 mg total) by mouth daily.   montelukast 10 MG tablet Commonly known as: SINGULAIR Take 1 tablet (10 mg total) by mouth at bedtime.   Olopatadine HCl 0.2 % Soln Place 1 drop into both eyes 2 (two) times daily as needed (allergies.).   pantoprazole 40 MG tablet Commonly known as: PROTONIX Take 1 tablet (40 mg total) by mouth daily. What changed: when to take this   potassium chloride SA 20 MEQ tablet Commonly known as: KLOR-CON Take 3 tablets (60 mEq total) by mouth daily. What changed:  how much to take when to take this additional instructions   senna-docusate 8.6-50 MG tablet Commonly known as: Senokot-S Take 1 tablet by mouth 2 (two) times daily. While taking strong pain meds to prevent constipation   sulfamethoxazole-trimethoprim 800-160 MG tablet Commonly known as: BACTRIM DS Take 1 tablet by mouth 2 (two) times daily. Start the day prior to foley removal appointment   tadalafil 5 MG tablet Commonly known as: CIALIS Take 1-2 tablets (5-10 mg total) by mouth daily as needed for erectile dysfunction.   tamsulosin 0.4 MG Caps capsule Commonly known as: FLOMAX TAKE ONE (1) CAPSULE EACH DAY. What changed: See the new instructions.        Matt R. [facility]  Pager: [phone]

## 2021-07-01 ENCOUNTER — Ambulatory Visit: Payer: Self-pay | Admitting: *Deleted

## 2021-07-01 DIAGNOSIS — N135 Crossing vessel and stricture of ureter without hydronephrosis: Secondary | ICD-10-CM | POA: Diagnosis not present

## 2021-07-01 NOTE — Telephone Encounter (Signed)
Patient called for appointment today for chest congestion, earliest availability is tomorrow. Requested an antibiotic.  Spoke with Mrs. Runde feels congestion in his chest, no pain/SOB/fever/cough. Advised he take mucinex and increase daily water. Appointment made for Friday. Pharmacy on file. Patient requesting callback either way regarding prescription. Okay to leave VM if no answer.

## 2021-07-02 ENCOUNTER — Encounter: Payer: Self-pay | Admitting: Family Medicine

## 2021-07-02 ENCOUNTER — Other Ambulatory Visit: Payer: Self-pay

## 2021-07-02 ENCOUNTER — Telehealth: Payer: Medicare PPO | Admitting: Family Medicine

## 2021-07-02 ENCOUNTER — Ambulatory Visit: Payer: Medicare PPO | Admitting: Family Medicine

## 2021-07-02 VITALS — BP 120/88 | HR 80 | Temp 98.6°F | Ht 73.0 in | Wt 237.0 lb

## 2021-07-02 DIAGNOSIS — R350 Frequency of micturition: Secondary | ICD-10-CM | POA: Diagnosis not present

## 2021-07-02 DIAGNOSIS — J0100 Acute maxillary sinusitis, unspecified: Secondary | ICD-10-CM

## 2021-07-02 LAB — POCT URINALYSIS DIPSTICK
Bilirubin, UA: NEGATIVE
Glucose, UA: NEGATIVE
Ketones, UA: NEGATIVE
Nitrite, UA: NEGATIVE
Protein, UA: NEGATIVE
Spec Grav, UA: 1.015 (ref 1.010–1.025)
Urobilinogen, UA: NEGATIVE E.U./dL — AB
pH, UA: 5 (ref 5.0–8.0)

## 2021-07-02 MED ORDER — DOXYCYCLINE HYCLATE 100 MG PO TABS
100.0000 mg | ORAL_TABLET | Freq: Two times a day (BID) | ORAL | 0 refills | Status: DC
Start: 2021-07-02 — End: 2021-07-08

## 2021-07-02 NOTE — Progress Notes (Signed)
doxy   Date:  07/02/2021   Name:  [name]   DOB:  [birth year]   MRN:  [id number]   Chief Complaint: chest congestion (Started with earache Monday, then chest congestion on Wednesday. R) ear is "sore," cough with thick green mucous. )  Sinusitis This is a new problem. The current episode started in the past 7 days. The problem has been gradually worsening since onset. There has been no fever. He is experiencing no pain. Associated symptoms include chills, congestion, coughing, diaphoresis, ear pain, sinus pressure and a sore throat. Pertinent negatives include no headaches, hoarse voice, neck pain, shortness of breath, sneezing or swollen glands. Treatments tried: mucinex. The treatment provided no relief.   Lab Results  Component Value Date   CREATININE 0.84 06/19/2021   BUN 11 06/19/2021   NA 141 06/19/2021   K 4.0 06/19/2021   CL 108 06/19/2021   CO2 26 06/19/2021   Lab Results  Component Value Date   CHOL 180 03/04/2021   HDL 46 03/04/2021   LDLCALC 119 (H) 03/04/2021   TRIG 80 03/04/2021   CHOLHDL 4.1 08/07/2019   No results found for: TSH No results found for: HGBA1C Lab Results  Component Value Date   WBC 19.1 (H) 06/09/2021   HGB 11.4 (L) 06/19/2021   HCT 37.0 (L) 06/19/2021   MCV 77.7 (L) 06/09/2021   PLT 213 06/09/2021   Lab Results  Component Value Date   ALT 10 04/06/2020   AST 10 04/06/2020   ALKPHOS 147 (H) 04/06/2020   BILITOT 0.2 04/06/2020     Review of Systems  Constitutional:  Positive for chills and diaphoresis. Negative for fever.  HENT:  Positive for congestion, ear pain, sinus pressure and sore throat. Negative for drooling, ear discharge, hoarse voice and sneezing.   Respiratory:  Positive for cough. Negative for shortness of breath and wheezing.   Cardiovascular:  Negative for chest pain, palpitations and leg swelling.  Gastrointestinal:  Negative for abdominal pain, blood in stool, constipation, diarrhea and nausea.  Endocrine:  Negative for polydipsia.  Genitourinary:  Negative for dysuria, frequency, hematuria and urgency.  Musculoskeletal:  Negative for back pain, myalgias and neck pain.  Skin:  Negative for rash.  Allergic/Immunologic: Negative for environmental allergies.  Neurological:  Negative for dizziness and headaches.  Hematological:  Does not bruise/bleed easily.  Psychiatric/Behavioral:  Negative for suicidal ideas. The patient is not nervous/anxious.    Patient Active Problem List   Diagnosis Date Noted   Ureteral stricture 06/18/2021   Sacroiliitis ([place]) 05/07/2021   Spinal stenosis of lumbosacral region 01/18/2021   Herniated nucleus pulposus, lumbar 03/16/2020   Body mass index (BMI) 33.0-33.9, adult 09/03/2019   Other intervertebral disc displacement, lumbar region 06/25/2019   HNP (herniated nucleus pulposus), lumbar 06/12/2019   UTI due to Klebsiella species 05/08/2019   Biceps tendonitis on right 07/27/2018   Spinal stenosis, 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   Lumbago with sciatica, unspecified side 11/14/2017   Primary osteoarthritis of right knee 10/12/2017   Taking multiple medications for chronic disease 07/30/2015   Bronchitis 07/01/2015   Reactive airway disease 07/01/2015   Allergic sinusitis 07/01/2015   Essential (primary) hypertension 07/01/2015   Valvular heart disease 07/01/2015   Metatarsalgia of left foot 06/10/2015   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    Allergies  Allergen Reactions   Meloxicam Palpitations    Past Surgical History:  Procedure Laterality Date   ABDOMINAL EXPOSURE N/A 01/18/2021   Procedure: ABDOMINAL EXPOSURE;  Surgeon: [name], MD;  Location: [place];  Service: Vascular;  Laterality: N/A;   ANTERIOR LUMBAR FUSION N/A 01/18/2021   Procedure: Anterior Lumbar Interbody Fusion - Lumbar Five-Sarcal One;  Surgeon: Kary Kos, MD;  Location: [place];  Service: Neurosurgery;  Laterality: N/A;  Anterior Lumbar Interbody Fusion -  Lumbar Five-Sacral One   CYSTOSCOPY W/ URETERAL STENT PLACEMENT Left 06/18/2021   Procedure: CYSTOSCOPY WITH RETROGRADE PYELOGRAM/URETERAL STENT EXCHANGE;  Surgeon: Alexis Frock, MD;  Location: [facility];  Service: Urology;  Laterality: Left;   CYSTOSCOPY WITH RETROGRADE PYELOGRAM, URETEROSCOPY AND STENT PLACEMENT Left 04/21/2021   Procedure: CYSTOSCOPY WITH RETROGRADE PYELOGRAM, DIAGNOSTICURETEROSCOPY AND STENT PLACEMENT;  Surgeon: Alexis Frock, MD;  Location[facility];  Service: Urology;  Laterality: Left;  1 HR   EXTRACORPOREAL SHOCK WAVE LITHOTRIPSY     approx 2002   LUMBAR [place] SURGERY  2018   REPAIR DURAL / CSF LEAK  2018   post op lumbar diskectomy   REVISION TOTAL KNEE ARTHROPLASTY Left 2015   TOTAL KNEE ARTHROPLASTY Left 2012   TRANSFORAMINAL LUMBAR INTERBODY FUSION (TLIF) WITH PEDICLE SCREW FIXATION 1 LEVEL N/A 06/12/2019   Procedure: LUMBAR FOUR-FIVE TRANSFORAMINAL LUMBAR INTERBODY FUSION (TLIF) WITH PEDICLE SCREW FIXATION;  Surgeon: Kary Kos, MD;  Location: [place];  Service: Neurosurgery;  Laterality: N/A;   UMBILICAL HERNIA REPAIR  1992    Social History   Tobacco Use   Smoking status: Never   Smokeless tobacco: Former    Types: Chew    Quit date: 03/2020  Vaping Use   Vaping Use: Never used  Substance Use Topics   Alcohol use: Yes    Comment: ocassional   Drug use: Never     Medication list has been reviewed and updated.  Current Meds  Medication Sig   diltiazem (CARDIZEM CD) 180 MG 24 hr capsule Take 1 capsule (180 mg total) by mouth 2 (two) times daily.   docusate sodium (COLACE) 100 MG capsule Take 1 capsule (100 mg total) by mouth 2 (two) times daily.   fexofenadine (ALLEGRA) 180 MG tablet Take 1 tablet (180 mg total) by mouth daily.   fluticasone (FLONASE) 50 MCG/ACT nasal spray Use 1 spray(s) in each nostril once daily (Patient taking differently: Place 1 spray into both nostrils daily as needed for allergies.)   hydrochlorothiazide  (HYDRODIURIL) 12.5 MG tablet Take 1 tablet (12.5 mg total) by mouth daily.   metoprolol succinate (TOPROL-XL) 25 MG 24 hr tablet Take 1 tablet (25 mg total) by mouth daily.   montelukast (SINGULAIR) 10 MG tablet Take 1 tablet (10 mg total) by mouth at bedtime.   Olopatadine HCl 0.2 % SOLN Place 1 drop into both eyes 2 (two) times daily as needed (allergies.).   pantoprazole (PROTONIX) 40 MG tablet Take 1 tablet (40 mg total) by mouth daily. (Patient taking differently: Take 40 mg by mouth every evening.)   Phenazopyridine HCl (AZO URINARY PAIN PO) Take 1 capsule by mouth daily.   potassium chloride SA (KLOR-CON) 20 MEQ tablet Take 3 tablets (60 mEq total) by mouth daily. (Patient taking differently: Take 20-40 mEq by mouth 2 (two) times daily. Takes one tablet in am and two tabs in pm)   senna-docusate (SENOKOT-S) 8.6-50 MG tablet Take 1 tablet by mouth 2 (two) times daily. While taking strong pain meds to prevent constipation   sulfamethoxazole-trimethoprim (BACTRIM DS) 800-160 MG tablet Take 1 tablet by mouth 2 (two) times daily. Start the day prior to foley removal appointment  tadalafil (CIALIS) 5 MG tablet Take 1-2 tablets (5-10 mg total) by mouth daily as needed for erectile dysfunction.   tamsulosin (FLOMAX) 0.4 MG CAPS capsule TAKE ONE (1) CAPSULE EACH DAY. (Patient taking differently: Take 0.4 mg by mouth daily.)   [DISCONTINUED] methocarbamol (ROBAXIN) 500 MG tablet Take 1 tablet (500 mg total) by mouth 4 (four) times daily.    PHQ 2/9 Scores 07/02/2021 06/15/2021 03/04/2021 09/14/2020  PHQ - 2 Score 0 0 0 0  PHQ- 9 Score 0 0 0 -    GAD 7 : Generalized Anxiety Score 07/02/2021 06/15/2021 03/04/2021 09/14/2020  Nervous, Anxious, on Edge 0 0 0 0  Control/stop worrying 0 0 0 0  Worry too much - different things 0 0 0 0  Trouble relaxing 0 0 0 0  Restless 0 0 0 0  Easily annoyed or irritable 0 0 0 0  Afraid - awful might happen 0 0 0 0  Total GAD 7 Score 0 0 0 0    BP Readings from Last  3 Encounters:  06/19/21 133/87  06/15/21 (!) 144/84  06/09/21 138/80    Physical Exam Vitals and nursing note reviewed.  HENT:     Head: Normocephalic.     Right Ear: Tympanic membrane, ear canal and external ear normal. There is no impacted cerumen.     Left Ear: Tympanic membrane, ear canal and external ear normal. There is no impacted cerumen.     Nose: Nose normal. No congestion or rhinorrhea.  Eyes:     General: No scleral icterus.       Right eye: No discharge.        Left eye: No discharge.     Conjunctiva/sclera: Conjunctivae normal.     Pupils: Pupils are equal, round, and reactive to light.  Neck:     Thyroid: No thyromegaly.     Vascular: No JVD.     Trachea: No tracheal deviation.  Cardiovascular:     Rate and Rhythm: Normal rate and regular rhythm.     Heart sounds: Normal heart sounds. No murmur heard.   No friction rub. No gallop.  Pulmonary:     Effort: No respiratory distress.     Breath sounds: Normal breath sounds. No wheezing, rhonchi or rales.  Abdominal:     General: Bowel sounds are normal.     Palpations: Abdomen is soft. There is no mass.     Tenderness: There is no abdominal tenderness. There is no guarding or rebound.  Musculoskeletal:        General: No tenderness. Normal range of motion.     Cervical back: Normal range of motion and neck supple.  Lymphadenopathy:     Cervical: No cervical adenopathy.  Skin:    General: Skin is warm.     Findings: No rash.  Neurological:     Mental Status: He is alert and oriented to person, place, and time.     Cranial Nerves: No cranial nerve deficit.     Deep Tendon Reflexes: Reflexes are normal and symmetric.    Wt Readings from Last 3 Encounters:  07/02/21 237 lb (107.5 kg)  06/18/21 241 lb (109.3 kg)  06/15/21 241 lb (109.3 kg)    Ht '6\' 1"'$  (1.854 m)   Wt 237 lb (107.5 kg)   BMI 31.27 kg/m   Assessment and Plan:  1. Acute maxillary sinusitis, recurrence not specified Aute persistent stable  Doxycycline 100 bid for 10 days - doxycycline (VIBRA-TABS) 100 MG tablet; Take  1 tablet (100 mg total) by mouth 2 (two) times daily.  Dispense: 20 tablet; Refill: 0  2. Urinary frequency U/a blood3+/luuk/tr COMPLETEED COURSE bactrim - POCT urinalysis dipstick

## 2021-07-05 ENCOUNTER — Ambulatory Visit
Admission: RE | Admit: 2021-07-05 | Discharge: 2021-07-05 | Disposition: A | Discharge status: Home / Self Care | Payer: Medicare PPO | Attending: Family Medicine | Admitting: Family Medicine

## 2021-07-05 ENCOUNTER — Telehealth: Payer: Self-pay

## 2021-07-05 ENCOUNTER — Other Ambulatory Visit: Payer: Self-pay

## 2021-07-05 ENCOUNTER — Ambulatory Visit
Admission: RE | Admit: 2021-07-05 | Discharge: 2021-07-05 | Disposition: A | Discharge status: Home / Self Care | Payer: Medicare PPO | Source: Ambulatory Visit | Attending: Family Medicine | Admitting: Family Medicine

## 2021-07-05 DIAGNOSIS — R059 Cough, unspecified: Secondary | ICD-10-CM | POA: Diagnosis not present

## 2021-07-05 NOTE — Telephone Encounter (Signed)
Copied from [place] [phone]. Topic: General - Other >> Jul 05, 2021  8:37 AM Celene Kras wrote: Reason for CRM: Pt calling stating that he is seeing no improvement in his symptoms. He states that he does not believe that the doxycycline is working. He states that he is still running a fever and has body aches. Pt requesting to have a call back from PCP or nurse regarding this. Please advise.

## 2021-07-05 NOTE — Progress Notes (Signed)
Ordered chest film

## 2021-07-06 DIAGNOSIS — M5440 Lumbago with sciatica, unspecified side: Secondary | ICD-10-CM | POA: Diagnosis not present

## 2021-07-06 DIAGNOSIS — M48062 Spinal stenosis, lumbar region with neurogenic claudication: Secondary | ICD-10-CM | POA: Diagnosis not present

## 2021-07-06 DIAGNOSIS — Z6831 Body mass index (BMI) 31.0-31.9, adult: Secondary | ICD-10-CM | POA: Diagnosis not present

## 2021-07-06 DIAGNOSIS — I10 Essential (primary) hypertension: Secondary | ICD-10-CM | POA: Diagnosis not present

## 2021-07-08 ENCOUNTER — Other Ambulatory Visit: Payer: Self-pay

## 2021-07-08 ENCOUNTER — Telehealth: Payer: Self-pay

## 2021-07-08 DIAGNOSIS — J0100 Acute maxillary sinusitis, unspecified: Secondary | ICD-10-CM

## 2021-07-08 MED ORDER — DOXYCYCLINE HYCLATE 100 MG PO TABS: 100.0000 mg | ORAL_TABLET | Freq: Two times a day (BID) | ORAL | 0 refills | Status: DC

## 2021-07-08 NOTE — Telephone Encounter (Signed)
Copied from [place] [phone]. Topic: General - Other >> Jul 08, 2021  9:40 AM Valere Dross wrote: Reason for CRM: Pt called in to leave Baxter Flattery a message about his antidotic and being able to receive another refill for a week, please advise.

## 2021-07-08 NOTE — Progress Notes (Signed)
Sent in additional 7 days of Doxy

## 2021-07-14 ENCOUNTER — Other Ambulatory Visit: Payer: Self-pay | Admitting: Family Medicine

## 2021-07-14 ENCOUNTER — Encounter: Payer: Self-pay | Admitting: Family Medicine

## 2021-07-14 ENCOUNTER — Other Ambulatory Visit: Payer: Self-pay

## 2021-07-14 ENCOUNTER — Ambulatory Visit: Payer: Medicare PPO | Admitting: Family Medicine

## 2021-07-14 VITALS — BP 138/90 | HR 68 | Temp 98.6°F | Ht 73.0 in | Wt 245.0 lb

## 2021-07-14 DIAGNOSIS — J4520 Mild intermittent asthma, uncomplicated: Secondary | ICD-10-CM

## 2021-07-14 DIAGNOSIS — J302 Other seasonal allergic rhinitis: Secondary | ICD-10-CM

## 2021-07-14 DIAGNOSIS — R059 Cough, unspecified: Secondary | ICD-10-CM

## 2021-07-14 DIAGNOSIS — J40 Bronchitis, not specified as acute or chronic: Secondary | ICD-10-CM | POA: Diagnosis not present

## 2021-07-14 MED ORDER — PREDNISONE 10 MG PO TABS: 10.0000 mg | ORAL_TABLET | Freq: Every day | ORAL | 0 refills | Status: DC

## 2021-07-14 MED ORDER — ALBUTEROL SULFATE HFA 108 (90 BASE) MCG/ACT IN AERS
2.0000 | INHALATION_SPRAY | Freq: Four times a day (QID) | RESPIRATORY_TRACT | 2 refills | Status: DC | PRN
Start: 2021-07-14 — End: 2021-09-20

## 2021-07-14 NOTE — Progress Notes (Signed)
Date:  07/14/2021   Name:  [name]   DOB:  [birth year]   MRN:  [id number]   Chief Complaint: Follow-up (Sinus- had 2 rounds of doxy- on day 3 of the second one now. Still having congestion, no fever, no facial pressure, no production, but has a "rattle" when he breathes at night. Gets better when he sits up.)  Cough This is a recurrent problem. The current episode started 1 to 4 weeks ago (2 weeks). The problem has been waxing and waning. The problem occurs every few minutes. Associated symptoms include nasal congestion, postnasal drip, shortness of breath and wheezing. Pertinent negatives include no chest pain, chills, fever, hemoptysis, myalgias, rash, rhinorrhea, sore throat, sweats or weight loss. Associated symptoms comments: Clearing throat/?stridor. The symptoms are aggravated by lying down. His past medical history is significant for environmental allergies and pneumonia. There is no history of asthma or emphysema.   Lab Results  Component Value Date   CREATININE 0.84 06/19/2021   BUN 11 06/19/2021   NA 141 06/19/2021   K 4.0 06/19/2021   CL 108 06/19/2021   CO2 26 06/19/2021   Lab Results  Component Value Date   CHOL 180 03/04/2021   HDL 46 03/04/2021   LDLCALC 119 (H) 03/04/2021   TRIG 80 03/04/2021   CHOLHDL 4.1 08/07/2019   No results found for: TSH No results found for: HGBA1C Lab Results  Component Value Date   WBC 19.1 (H) 06/09/2021   HGB 11.4 (L) 06/19/2021   HCT 37.0 (L) 06/19/2021   MCV 77.7 (L) 06/09/2021   PLT 213 06/09/2021   Lab Results  Component Value Date   ALT 10 04/06/2020   AST 10 04/06/2020   ALKPHOS 147 (H) 04/06/2020   BILITOT 0.2 04/06/2020     Review of Systems  Constitutional:  Negative for chills, fever and weight loss.  HENT:  Positive for postnasal drip. Negative for rhinorrhea and sore throat.   Respiratory:  Positive for cough, shortness of breath, wheezing and stridor. Negative for apnea, hemoptysis and chest  tightness.   Cardiovascular:  Negative for chest pain and palpitations.  Musculoskeletal:  Negative for myalgias.  Skin:  Negative for rash.  Allergic/Immunologic: Positive for environmental allergies.   Patient Active Problem List   Diagnosis Date Noted   Ureteral stricture 06/18/2021   Sacroiliitis ([place]) 05/07/2021   Spinal stenosis of lumbosacral region 01/18/2021   Herniated nucleus pulposus, lumbar 03/16/2020   Body mass index (BMI) 33.0-33.9, adult 09/03/2019   Other intervertebral disc displacement, lumbar region 06/25/2019   HNP (herniated nucleus pulposus), lumbar 06/12/2019   UTI due to Klebsiella species 05/08/2019   Biceps tendonitis on right 07/27/2018   Spinal stenosis, 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   Lumbago with sciatica, unspecified side 11/14/2017   Primary osteoarthritis of right knee 10/12/2017   Taking multiple medications for chronic disease 07/30/2015   Bronchitis 07/01/2015   Reactive airway disease 07/01/2015   Allergic sinusitis 07/01/2015   Essential (primary) hypertension 07/01/2015   Valvular heart disease 07/01/2015   Metatarsalgia of left foot 06/10/2015   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    Allergies  Allergen Reactions   Meloxicam Palpitations    Past Surgical History:  Procedure Laterality Date   ABDOMINAL EXPOSURE N/A 01/18/2021   Procedure: ABDOMINAL EXPOSURE;  Surgeon: [name], MD;  [facility];  Service: Vascular;  Laterality: N/A;   ANTERIOR LUMBAR FUSION N/A 01/18/2021   Procedure: Anterior Lumbar Interbody Fusion - Lumbar  Five-Sarcal One;  Surgeon: Kary Kos, MD;  Location: [place];  Service: Neurosurgery;  Laterality: N/A;  Anterior Lumbar Interbody Fusion - Lumbar Five-Sacral One   CYSTOSCOPY W/ URETERAL STENT PLACEMENT Left 06/18/2021   Procedure: CYSTOSCOPY WITH RETROGRADE PYELOGRAM/URETERAL STENT EXCHANGE;  Surgeon: Alexis Frock, MD;  Location: [facility];  Service: Urology;  Laterality: Left;    CYSTOSCOPY WITH RETROGRADE PYELOGRAM, URETEROSCOPY AND STENT PLACEMENT Left 04/21/2021   Procedure: CYSTOSCOPY WITH RETROGRADE PYELOGRAM, DIAGNOSTICURETEROSCOPY AND STENT PLACEMENT;  Surgeon: Alexis Frock, MD;  Location[facility];  Service: Urology;  Laterality: Left;  1 HR   EXTRACORPOREAL SHOCK WAVE LITHOTRIPSY     approx 2002   LUMBAR [place] SURGERY  2018   REPAIR DURAL / CSF LEAK  2018   post op lumbar diskectomy   REVISION TOTAL KNEE ARTHROPLASTY Left 2015   TOTAL KNEE ARTHROPLASTY Left 2012   TRANSFORAMINAL LUMBAR INTERBODY FUSION (TLIF) WITH PEDICLE SCREW FIXATION 1 LEVEL N/A 06/12/2019   Procedure: LUMBAR FOUR-FIVE TRANSFORAMINAL LUMBAR INTERBODY FUSION (TLIF) WITH PEDICLE SCREW FIXATION;  Surgeon: Kary Kos, MD;  Location: [place];  Service: Neurosurgery;  Laterality: N/A;   UMBILICAL HERNIA REPAIR  1992    Social History   Tobacco Use   Smoking status: Never   Smokeless tobacco: Former    Types: Chew    Quit date: 03/2020  Vaping Use   Vaping Use: Never used  Substance Use Topics   Alcohol use: Yes    Comment: ocassional   Drug use: Never     Medication list has been reviewed and updated.  Current Meds  Medication Sig   diltiazem (CARDIZEM CD) 180 MG 24 hr capsule Take 1 capsule (180 mg total) by mouth 2 (two) times daily.   docusate sodium (COLACE) 100 MG capsule Take 1 capsule (100 mg total) by mouth 2 (two) times daily.   doxycycline (VIBRA-TABS) 100 MG tablet Take 1 tablet (100 mg total) by mouth 2 (two) times daily.   fexofenadine (ALLEGRA) 180 MG tablet Take 1 tablet (180 mg total) by mouth daily.   fluticasone (FLONASE) 50 MCG/ACT nasal spray Use 1 spray(s) in each nostril once daily   hydrochlorothiazide (HYDRODIURIL) 12.5 MG tablet Take 1 tablet (12.5 mg total) by mouth daily.   HYDROcodone-acetaminophen (NORCO) 5-325 MG tablet Take 1-2 tablets by mouth every 6 (six) hours as needed for moderate pain.   lisinopril (ZESTRIL) 20 MG tablet  Take 1 tablet (20 mg total) by mouth every evening. (Patient taking differently: Take 20 mg by mouth at bedtime.)   metoprolol succinate (TOPROL-XL) 25 MG 24 hr tablet Take 1 tablet (25 mg total) by mouth daily.   montelukast (SINGULAIR) 10 MG tablet Take 1 tablet (10 mg total) by mouth at bedtime.   Olopatadine HCl 0.2 % SOLN Place 1 drop into both eyes 2 (two) times daily as needed (allergies.).   pantoprazole (PROTONIX) 40 MG tablet Take 1 tablet (40 mg total) by mouth daily. (Patient taking differently: Take 40 mg by mouth every evening.)   Phenazopyridine HCl (AZO URINARY PAIN PO) Take 1 capsule by mouth daily.   potassium chloride SA (KLOR-CON) 20 MEQ tablet Take 3 tablets (60 mEq total) by mouth daily. (Patient taking differently: Take 20-40 mEq by mouth 2 (two) times daily. Takes one tablet in am and two tabs in pm)   senna-docusate (SENOKOT-S) 8.6-50 MG tablet Take 1 tablet by mouth 2 (two) times daily. While taking strong pain meds to prevent constipation   tadalafil (CIALIS)  5 MG tablet Take 1-2 tablets (5-10 mg total) by mouth daily as needed for erectile dysfunction.   tamsulosin (FLOMAX) 0.4 MG CAPS capsule TAKE ONE (1) CAPSULE EACH DAY. (Patient taking differently: Take 0.4 mg by mouth daily.)   [DISCONTINUED] sulfamethoxazole-trimethoprim (BACTRIM DS) 800-160 MG tablet Take 1 tablet by mouth 2 (two) times daily. Start the day prior to foley removal appointment    [facility] 2/9 Scores 07/02/2021 06/15/2021 03/04/2021 09/14/2020  PHQ - 2 Score 0 0 0 0  PHQ- 9 Score 0 0 0 -    GAD 7 : Generalized Anxiety Score 07/02/2021 06/15/2021 03/04/2021 09/14/2020  Nervous, Anxious, on Edge 0 0 0 0  Control/stop worrying 0 0 0 0  Worry too much - different things 0 0 0 0  Trouble relaxing 0 0 0 0  Restless 0 0 0 0  Easily annoyed or irritable 0 0 0 0  Afraid - awful might happen 0 0 0 0  Total GAD 7 Score 0 0 0 0    BP Readings from Last 3 Encounters:  07/02/21 120/88  06/19/21 133/87  06/15/21  (!) 144/84    Physical Exam Vitals and nursing note reviewed.  HENT:     Head: Normocephalic.     Right Ear: Tympanic membrane, ear canal and external ear normal. There is no impacted cerumen.     Left Ear: Tympanic membrane, ear canal and external ear normal. There is no impacted cerumen.     Nose: Nose normal. No congestion or rhinorrhea.  Eyes:     General: No scleral icterus.       Right eye: No discharge.        Left eye: No discharge.     Conjunctiva/sclera: Conjunctivae normal.     Pupils: Pupils are equal, round, and reactive to light.  Neck:     Thyroid: No thyromegaly.     Vascular: No JVD.     Trachea: No tracheal deviation.  Cardiovascular:     Rate and Rhythm: Normal rate and regular rhythm.     Heart sounds: Normal heart sounds. No murmur heard.   No friction rub. No gallop.  Pulmonary:     Effort: No respiratory distress.     Breath sounds: Normal breath sounds. No wheezing or rales.  Abdominal:     General: Bowel sounds are normal.     Palpations: Abdomen is soft. There is no mass.     Tenderness: There is no abdominal tenderness. There is no guarding or rebound.  Musculoskeletal:        General: No tenderness. Normal range of motion.     Cervical back: Normal range of motion and neck supple.  Lymphadenopathy:     Cervical: No cervical adenopathy.  Skin:    General: Skin is warm.     Findings: No rash.  Neurological:     Mental Status: He is alert and oriented to person, place, and time.     Cranial Nerves: No cranial nerve deficit.     Deep Tendon Reflexes: Reflexes are normal and symmetric.    Wt Readings from Last 3 Encounters:  07/14/21 245 lb (111.1 kg)  07/02/21 237 lb (107.5 kg)  06/18/21 241 lb (109.3 kg)    Ht 6\' 1"  (1.854 m)   Wt 245 lb (111.1 kg)   BMI 32.32 kg/m   Assessment and Plan: 1. Bronchitis New onset.  Persistent.  Ongoing but not progressing.  Patient is behaving more of a bronchiolitis and review of  chest x-ray notes no  pneumonia from most recent BM of around 1 week ago.  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  2. Mild intermittent reactive airway disease without complication Chronic.  Intermittent.  Mild.  Currently active.  We will treat with prednisone 10 mg daily and albuterol rescue inhaler and patient has been given a Trelegy 100 mg for trial for 2 weeks. - predniSONE (DELTASONE) 10 MG tablet; Take 1 tablet (10 mg total) by mouth daily with breakfast.  Dispense: 30 tablet; Refill: 0 - albuterol (VENTOLIN HFA) 108 (90 Base) MCG/ACT inhaler; Inhale 2 puffs into the lungs every 6 (six) hours as needed for wheezing or shortness of breath.  Dispense: 8 g; Refill: 2  3. Other seasonal allergic rhinitis History of allergic rhinitis and patient has noticed some improvement on initiation of Singulair which may suggest either reactive airway disease or allergy component.  We will continue on current dosing of Singulair as well as Mucinex for mucolytic activity.  4. Cough Chronic and persistent.  Patient is currently taking over-the-counter cough preparation and would like to continue to do so.

## 2021-07-26 ENCOUNTER — Ambulatory Visit: Payer: Self-pay

## 2021-07-26 ENCOUNTER — Other Ambulatory Visit: Payer: Self-pay | Admitting: Family Medicine

## 2021-07-26 DIAGNOSIS — I10 Essential (primary) hypertension: Secondary | ICD-10-CM

## 2021-07-26 NOTE — Telephone Encounter (Signed)
Called pt scheduled an appt for tomorrow 07/27/21.  KP

## 2021-07-26 NOTE — Telephone Encounter (Signed)
Pt had a OV with  Dr Ronnald Ramp 07-14-2021 and was dx with bronchitis. Pt is calling to report he still having cough, temp 100.7  to 101.3 and chest congestion. Pt was given a sample of trelegy inhaler that is help and has only one more day.  Pt did not want to make follow up appt and would like to know the next step maybe md would call in abx  Pt coughing light green phlegm. Denies SOB.Pt stated he is wheezing. Clears slightly after coughing.  Pt tested negative on a home covid test this morning. Care advice given but pt did not want an appt- Pt wants abx called in. Routing high priority to office.  Reason for Disposition  Wheezing is present  Answer Assessment - Initial Assessment Questions 1. ONSET: "When did the cough begin?"      07/14/21 2. SEVERITY: "How bad is the cough today?"      frequent 3. SPUTUM: "Describe the color of your sputum" (none, dry cough; clear, white, yellow, green)     Lt green 4. HEMOPTYSIS: "Are you coughing up any blood?" If so ask: "How much?" (flecks, streaks, tablespoons, etc.)     no 5. DIFFICULTY BREATHING: "Are you having difficulty breathing?" If Yes, ask: "How bad is it?" (e.g., mild, moderate, severe)    - MILD: No SOB at rest, mild SOB with walking, speaks normally in sentences, can lie down, no retractions, pulse < 100.    - MODERATE: SOB at rest, SOB with minimal exertion and prefers to sit, cannot lie down flat, speaks in phrases, mild retractions, audible wheezing, pulse 100-120.    - SEVERE: Very SOB at rest, speaks in single words, struggling to breathe, sitting hunched forward, retractions, pulse > 120      none 6. FEVER: "Do you have a fever?" If Yes, ask: "What is your temperature, how was it measured, and when did it start?"     101. 3 tx with Tylenol 7. CARDIAC HISTORY: "Do you have any history of heart disease?" (e.g., heart attack, congestive heart failure)      HTN 8. LUNG HISTORY: "Do you have any history of lung disease?"  (e.g., pulmonary  embolus, asthma, emphysema)     no 9. PE RISK FACTORS: "Do you have a history of blood clots?" (or: recent major surgery, recent prolonged travel, bedridden)     no 10. OTHER SYMPTOMS: "Do you have any other symptoms?" (e.g., runny nose, wheezing, chest pain)       Runny nose (clear), wheezing, headache- neg Covid test 11. PREGNANCY: "Is there any chance you are pregnant?" "When was your last menstrual period?"       no 12. TRAVEL: "Have you traveled out of the country in the last month?" (e.g., travel history, exposures)       no  Protocols used: Cough - Acute Productive-A-AH

## 2021-07-27 ENCOUNTER — Other Ambulatory Visit: Payer: Self-pay

## 2021-07-27 ENCOUNTER — Ambulatory Visit: Payer: Medicare PPO | Admitting: Family Medicine

## 2021-07-27 ENCOUNTER — Encounter: Payer: Self-pay | Admitting: Family Medicine

## 2021-07-27 ENCOUNTER — Inpatient Hospital Stay
Admission: EM | Admit: 2021-07-27 | Discharge: 2021-08-02 | DRG: 698 | Disposition: A | Discharge status: Home / Self Care | Payer: Medicare PPO | Source: Ambulatory Visit | Attending: Internal Medicine | Admitting: Internal Medicine

## 2021-07-27 ENCOUNTER — Emergency Department: Payer: Medicare PPO

## 2021-07-27 VITALS — BP 118/74 | HR 121 | Temp 101.3°F | Resp 24 | Ht 73.0 in | Wt 243.0 lb

## 2021-07-27 DIAGNOSIS — Z79899 Other long term (current) drug therapy: Secondary | ICD-10-CM

## 2021-07-27 DIAGNOSIS — J45909 Unspecified asthma, uncomplicated: Secondary | ICD-10-CM | POA: Diagnosis not present

## 2021-07-27 DIAGNOSIS — R7881 Bacteremia: Secondary | ICD-10-CM

## 2021-07-27 DIAGNOSIS — T83593A Infection and inflammatory reaction due to other urinary stents, initial encounter: Secondary | ICD-10-CM | POA: Diagnosis not present

## 2021-07-27 DIAGNOSIS — E872 Acidosis, unspecified: Secondary | ICD-10-CM | POA: Diagnosis present

## 2021-07-27 DIAGNOSIS — Z7952 Long term (current) use of systemic steroids: Secondary | ICD-10-CM

## 2021-07-27 DIAGNOSIS — R531 Weakness: Secondary | ICD-10-CM | POA: Diagnosis not present

## 2021-07-27 DIAGNOSIS — Z981 Arthrodesis status: Secondary | ICD-10-CM

## 2021-07-27 DIAGNOSIS — I10 Essential (primary) hypertension: Secondary | ICD-10-CM | POA: Diagnosis present

## 2021-07-27 DIAGNOSIS — I482 Chronic atrial fibrillation, unspecified: Secondary | ICD-10-CM | POA: Diagnosis not present

## 2021-07-27 DIAGNOSIS — Z6831 Body mass index (BMI) 31.0-31.9, adult: Secondary | ICD-10-CM

## 2021-07-27 DIAGNOSIS — Z83438 Family history of other disorder of lipoprotein metabolism and other lipidemia: Secondary | ICD-10-CM | POA: Diagnosis not present

## 2021-07-27 DIAGNOSIS — A419 Sepsis, unspecified organism: Secondary | ICD-10-CM | POA: Diagnosis not present

## 2021-07-27 DIAGNOSIS — Z466 Encounter for fitting and adjustment of urinary device: Secondary | ICD-10-CM | POA: Diagnosis not present

## 2021-07-27 DIAGNOSIS — N135 Crossing vessel and stricture of ureter without hydronephrosis: Secondary | ICD-10-CM | POA: Diagnosis not present

## 2021-07-27 DIAGNOSIS — E782 Mixed hyperlipidemia: Secondary | ICD-10-CM | POA: Diagnosis present

## 2021-07-27 DIAGNOSIS — N390 Urinary tract infection, site not specified: Secondary | ICD-10-CM | POA: Diagnosis not present

## 2021-07-27 DIAGNOSIS — Z87442 Personal history of urinary calculi: Secondary | ICD-10-CM | POA: Diagnosis not present

## 2021-07-27 DIAGNOSIS — E876 Hypokalemia: Secondary | ICD-10-CM | POA: Diagnosis present

## 2021-07-27 DIAGNOSIS — Z9889 Other specified postprocedural states: Secondary | ICD-10-CM | POA: Diagnosis not present

## 2021-07-27 DIAGNOSIS — N401 Enlarged prostate with lower urinary tract symptoms: Secondary | ICD-10-CM | POA: Diagnosis present

## 2021-07-27 DIAGNOSIS — K219 Gastro-esophageal reflux disease without esophagitis: Secondary | ICD-10-CM | POA: Diagnosis present

## 2021-07-27 DIAGNOSIS — A4159 Other Gram-negative sepsis: Secondary | ICD-10-CM | POA: Diagnosis present

## 2021-07-27 DIAGNOSIS — R319 Hematuria, unspecified: Secondary | ICD-10-CM | POA: Diagnosis not present

## 2021-07-27 DIAGNOSIS — J4520 Mild intermittent asthma, uncomplicated: Secondary | ICD-10-CM | POA: Diagnosis not present

## 2021-07-27 DIAGNOSIS — Z7951 Long term (current) use of inhaled steroids: Secondary | ICD-10-CM | POA: Diagnosis not present

## 2021-07-27 DIAGNOSIS — E669 Obesity, unspecified: Secondary | ICD-10-CM | POA: Diagnosis not present

## 2021-07-27 DIAGNOSIS — Z87891 Personal history of nicotine dependence: Secondary | ICD-10-CM | POA: Diagnosis not present

## 2021-07-27 DIAGNOSIS — Z7901 Long term (current) use of anticoagulants: Secondary | ICD-10-CM

## 2021-07-27 DIAGNOSIS — Z20822 Contact with and (suspected) exposure to covid-19: Secondary | ICD-10-CM | POA: Diagnosis present

## 2021-07-27 DIAGNOSIS — I480 Paroxysmal atrial fibrillation: Secondary | ICD-10-CM | POA: Diagnosis present

## 2021-07-27 DIAGNOSIS — Y831 Surgical operation with implant of artificial internal device as the cause of abnormal reaction of the patient, or of later complication, without mention of misadventure at the time of the procedure: Secondary | ICD-10-CM | POA: Diagnosis present

## 2021-07-27 DIAGNOSIS — R6510 Systemic inflammatory response syndrome (SIRS) of non-infectious origin without acute organ dysfunction: Secondary | ICD-10-CM | POA: Insufficient documentation

## 2021-07-27 DIAGNOSIS — N4889 Other specified disorders of penis: Secondary | ICD-10-CM | POA: Diagnosis not present

## 2021-07-27 DIAGNOSIS — Z960 Presence of urogenital implants: Secondary | ICD-10-CM

## 2021-07-27 DIAGNOSIS — N1330 Unspecified hydronephrosis: Secondary | ICD-10-CM

## 2021-07-27 DIAGNOSIS — B9689 Other specified bacterial agents as the cause of diseases classified elsewhere: Secondary | ICD-10-CM | POA: Diagnosis not present

## 2021-07-27 DIAGNOSIS — Z96653 Presence of artificial knee joint, bilateral: Secondary | ICD-10-CM | POA: Diagnosis present

## 2021-07-27 DIAGNOSIS — R509 Fever, unspecified: Secondary | ICD-10-CM | POA: Diagnosis not present

## 2021-07-27 LAB — CBC WITH DIFFERENTIAL/PLATELET
Abs Immature Granulocytes: 0.21 10*3/uL — ABNORMAL HIGH (ref 0.00–0.07)
Basophils Absolute: 0 10*3/uL (ref 0.0–0.1)
Basophils Relative: 0 %
Eosinophils Absolute: 0 10*3/uL (ref 0.0–0.5)
Eosinophils Relative: 0 %
HCT: 39.8 % (ref 39.0–52.0)
Hemoglobin: 12.3 g/dL — ABNORMAL LOW (ref 13.0–17.0)
Immature Granulocytes: 1 %
Lymphocytes Relative: 5 %
Lymphs Abs: 0.8 10*3/uL (ref 0.7–4.0)
MCH: 24.3 pg — ABNORMAL LOW (ref 26.0–34.0)
MCHC: 30.9 g/dL (ref 30.0–36.0)
MCV: 78.7 fL — ABNORMAL LOW (ref 80.0–100.0)
Monocytes Absolute: 0.6 10*3/uL (ref 0.1–1.0)
Monocytes Relative: 4 %
Neutro Abs: 13.7 10*3/uL — ABNORMAL HIGH (ref 1.7–7.7)
Neutrophils Relative %: 90 %
Platelets: 199 10*3/uL (ref 150–400)
RBC: 5.06 MIL/uL (ref 4.22–5.81)
RDW: 17.2 % — ABNORMAL HIGH (ref 11.5–15.5)
WBC: 15.4 10*3/uL — ABNORMAL HIGH (ref 4.0–10.5)
nRBC: 0 % (ref 0.0–0.2)

## 2021-07-27 LAB — URINALYSIS, COMPLETE (UACMP) WITH MICROSCOPIC
Bilirubin Urine: NEGATIVE
Glucose, UA: NEGATIVE mg/dL
Ketones, ur: NEGATIVE mg/dL
Nitrite: POSITIVE — AB
Protein, ur: 100 mg/dL — AB
Specific Gravity, Urine: 1.02 (ref 1.005–1.030)
Squamous Epithelial / HPF: NONE SEEN (ref 0–5)
WBC, UA: >50 WBC/hpf — ABNORMAL HIGH (ref 0–5)
pH: 6 (ref 5.0–8.0)

## 2021-07-27 LAB — COMPREHENSIVE METABOLIC PANEL
ALT: 24 U/L (ref 0–44)
AST: 23 U/L (ref 15–41)
Albumin: 3.5 g/dL (ref 3.5–5.0)
Alkaline Phosphatase: 73 U/L (ref 38–126)
Anion gap: 11 (ref 5–15)
BUN: 14 mg/dL (ref 8–23)
CO2: 24 mmol/L (ref 22–32)
Calcium: 8.9 mg/dL (ref 8.9–10.3)
Chloride: 98 mmol/L (ref 98–111)
Creatinine, Ser: 1.12 mg/dL (ref 0.61–1.24)
GFR, Estimated: >60 mL/min (ref >60)
Glucose, Bld: 182 mg/dL — ABNORMAL HIGH (ref 70–99)
Potassium: 3.4 mmol/L — ABNORMAL LOW (ref 3.5–5.1)
Sodium: 133 mmol/L — ABNORMAL LOW (ref 135–145)
Total Bilirubin: 1.1 mg/dL (ref 0.3–1.2)
Total Protein: 7.5 g/dL (ref 6.5–8.1)

## 2021-07-27 LAB — PROTIME-INR
INR: 1.7 — ABNORMAL HIGH (ref 0.8–1.2)
INR: 2.2 — ABNORMAL HIGH (ref 0.8–1.2)
Prothrombin Time: 19.7 seconds — ABNORMAL HIGH (ref 11.4–15.2)
Prothrombin Time: 24.7 seconds — ABNORMAL HIGH (ref 11.4–15.2)

## 2021-07-27 LAB — RESP PANEL BY RT-PCR (FLU A&B, COVID) ARPGX2
Influenza A by PCR: NEGATIVE
Influenza B by PCR: NEGATIVE
SARS Coronavirus 2 by RT PCR: NEGATIVE

## 2021-07-27 LAB — APTT: aPTT: 41 seconds — ABNORMAL HIGH (ref 24–36)

## 2021-07-27 LAB — LACTIC ACID, PLASMA
Lactic Acid, Venous: 2.5 mmol/L (ref 0.5–1.9)
Lactic Acid, Venous: 1.9 mmol/L (ref 0.5–1.9)

## 2021-07-27 MED ORDER — LORATADINE 10 MG PO TABS
10.0000 mg | ORAL_TABLET | Freq: Every day | ORAL | Status: DC
  Administered 2021-07-28 – 2021-08-02 (×6): 10 mg via ORAL
  Filled 2021-07-27 (×6): qty 1

## 2021-07-27 MED ORDER — POTASSIUM CHLORIDE CRYS ER 20 MEQ PO TBCR
20.0000 meq | EXTENDED_RELEASE_TABLET | Freq: Every day | ORAL | Status: DC
  Administered 2021-07-28 – 2021-07-29 (×2): 20 meq via ORAL
  Filled 2021-07-27 (×2): qty 1

## 2021-07-27 MED ORDER — METHYLPREDNISOLONE SODIUM SUCC 125 MG IJ SOLR
60.0000 mg | Freq: Once | INTRAMUSCULAR | Status: AC
  Administered 2021-07-27: 60 mg via INTRAVENOUS
  Filled 2021-07-27: qty 2

## 2021-07-27 MED ORDER — ALBUTEROL SULFATE (2.5 MG/3ML) 0.083% IN NEBU: 3.0000 mL | INHALATION_SOLUTION | Freq: Four times a day (QID) | RESPIRATORY_TRACT | Status: DC | PRN

## 2021-07-27 MED ORDER — OLOPATADINE HCL 0.1 % OP SOLN
1.0000 [drp] | Freq: Two times a day (BID) | OPHTHALMIC | Status: DC | PRN
  Filled 2021-07-27: qty 5

## 2021-07-27 MED ORDER — SODIUM CHLORIDE 0.9 % IV BOLUS
1000.0000 mL | Freq: Once | INTRAVENOUS | Status: AC
  Administered 2021-07-27: 1000 mL via INTRAVENOUS

## 2021-07-27 MED ORDER — FLUTICASONE-UMECLIDIN-VILANT 100-62.5-25 MCG/INH IN AEPB: 1.0000 | INHALATION_SPRAY | Freq: Every day | RESPIRATORY_TRACT | Status: DC

## 2021-07-27 MED ORDER — PANTOPRAZOLE SODIUM 40 MG PO TBEC
40.0000 mg | DELAYED_RELEASE_TABLET | Freq: Every evening | ORAL | Status: DC
  Administered 2021-07-28 – 2021-08-01 (×5): 40 mg via ORAL
  Filled 2021-07-27 (×5): qty 1

## 2021-07-27 MED ORDER — ACETAMINOPHEN 650 MG RE SUPP
650.0000 mg | Freq: Four times a day (QID) | RECTAL | Status: DC | PRN
  Filled 2021-07-27: qty 1

## 2021-07-27 MED ORDER — UMECLIDINIUM BROMIDE 62.5 MCG/INH IN AEPB
1.0000 | INHALATION_SPRAY | Freq: Every day | RESPIRATORY_TRACT | Status: DC
  Administered 2021-07-28 – 2021-08-02 (×6): 1 via RESPIRATORY_TRACT
  Filled 2021-07-27: qty 7

## 2021-07-27 MED ORDER — KETOROLAC TROMETHAMINE 15 MG/ML IJ SOLN
15.0000 mg | Freq: Once | INTRAMUSCULAR | Status: AC | PRN
  Administered 2021-07-27: 15 mg via INTRAVENOUS
  Filled 2021-07-27: qty 1

## 2021-07-27 MED ORDER — HYDROCODONE-ACETAMINOPHEN 5-325 MG PO TABS
1.0000 | ORAL_TABLET | Freq: Four times a day (QID) | ORAL | Status: DC | PRN
  Administered 2021-07-27 – 2021-07-30 (×4): 1 via ORAL
  Filled 2021-07-27 (×5): qty 1

## 2021-07-27 MED ORDER — MORPHINE SULFATE (PF) 2 MG/ML IV SOLN: 2.0000 mg | INTRAVENOUS | Status: AC | PRN

## 2021-07-27 MED ORDER — TRAZODONE HCL 50 MG PO TABS: 25.0000 mg | ORAL_TABLET | Freq: Every evening | ORAL | Status: DC | PRN

## 2021-07-27 MED ORDER — PREDNISONE 20 MG PO TABS
10.0000 mg | ORAL_TABLET | Freq: Every day | ORAL | Status: DC
  Administered 2021-07-28 – 2021-08-02 (×6): 10 mg via ORAL
  Filled 2021-07-27 (×6): qty 1

## 2021-07-27 MED ORDER — SODIUM CHLORIDE 0.9 % IV SOLN
2.0000 g | Freq: Three times a day (TID) | INTRAVENOUS | Status: DC
  Administered 2021-07-27 – 2021-07-31 (×11): 2 g via INTRAVENOUS
  Filled 2021-07-27 (×14): qty 2

## 2021-07-27 MED ORDER — SENNOSIDES-DOCUSATE SODIUM 8.6-50 MG PO TABS
1.0000 | ORAL_TABLET | Freq: Two times a day (BID) | ORAL | Status: DC
  Administered 2021-07-27 – 2021-08-01 (×9): 1 via ORAL
  Filled 2021-07-27 (×12): qty 1

## 2021-07-27 MED ORDER — DILTIAZEM HCL ER COATED BEADS 180 MG PO CP24
180.0000 mg | ORAL_CAPSULE | Freq: Two times a day (BID) | ORAL | Status: DC
  Administered 2021-07-27 – 2021-08-02 (×12): 180 mg via ORAL
  Filled 2021-07-27 (×14): qty 1

## 2021-07-27 MED ORDER — TAMSULOSIN HCL 0.4 MG PO CAPS
0.4000 mg | ORAL_CAPSULE | Freq: Every day | ORAL | Status: DC
  Administered 2021-07-28 – 2021-08-02 (×6): 0.4 mg via ORAL
  Filled 2021-07-27 (×6): qty 1

## 2021-07-27 MED ORDER — SODIUM CHLORIDE 0.9 % IV SOLN
2.0000 g | Freq: Once | INTRAVENOUS | Status: AC
  Administered 2021-07-27: 2 g via INTRAVENOUS
  Filled 2021-07-27: qty 2

## 2021-07-27 MED ORDER — POTASSIUM CHLORIDE CRYS ER 20 MEQ PO TBCR
40.0000 meq | EXTENDED_RELEASE_TABLET | Freq: Every day | ORAL | Status: DC
  Administered 2021-07-27 – 2021-07-28 (×2): 40 meq via ORAL
  Filled 2021-07-27 (×2): qty 2

## 2021-07-27 MED ORDER — HYDROCHLOROTHIAZIDE 25 MG PO TABS
12.5000 mg | ORAL_TABLET | Freq: Every day | ORAL | Status: DC
  Administered 2021-07-28 – 2021-08-02 (×6): 12.5 mg via ORAL
  Filled 2021-07-27 (×7): qty 1

## 2021-07-27 MED ORDER — POTASSIUM CHLORIDE CRYS ER 20 MEQ PO TBCR: 60.0000 meq | EXTENDED_RELEASE_TABLET | Freq: Every day | ORAL | Status: DC

## 2021-07-27 MED ORDER — ENOXAPARIN SODIUM 60 MG/0.6ML IJ SOSY
0.5000 mg/kg | PREFILLED_SYRINGE | INTRAMUSCULAR | Status: DC
  Administered 2021-07-27: 55 mg via SUBCUTANEOUS
  Filled 2021-07-27: qty 0.6

## 2021-07-27 MED ORDER — FLUTICASONE FUROATE-VILANTEROL 100-25 MCG/INH IN AEPB
1.0000 | INHALATION_SPRAY | Freq: Every day | RESPIRATORY_TRACT | Status: DC
  Administered 2021-07-28 – 2021-08-02 (×6): 1 via RESPIRATORY_TRACT
  Filled 2021-07-27: qty 28

## 2021-07-27 MED ORDER — ACETAMINOPHEN 325 MG PO TABS
650.0000 mg | ORAL_TABLET | Freq: Four times a day (QID) | ORAL | Status: DC | PRN
  Administered 2021-07-27 – 2021-07-31 (×4): 650 mg via ORAL
  Filled 2021-07-27 (×4): qty 2

## 2021-07-27 MED ORDER — METOPROLOL SUCCINATE ER 25 MG PO TB24
25.0000 mg | ORAL_TABLET | Freq: Every day | ORAL | Status: DC
  Administered 2021-07-28 – 2021-07-29 (×2): 25 mg via ORAL
  Filled 2021-07-27 (×2): qty 1

## 2021-07-27 MED ORDER — LACTATED RINGERS IV SOLN: INTRAVENOUS | Status: AC

## 2021-07-27 MED ORDER — APIXABAN 5 MG PO TABS
5.0000 mg | ORAL_TABLET | Freq: Two times a day (BID) | ORAL | Status: DC
  Administered 2021-07-27 – 2021-08-02 (×12): 5 mg via ORAL
  Filled 2021-07-27 (×12): qty 1

## 2021-07-27 MED ORDER — LISINOPRIL 20 MG PO TABS
20.0000 mg | ORAL_TABLET | Freq: Every day | ORAL | Status: DC
  Administered 2021-07-27 – 2021-08-01 (×6): 20 mg via ORAL
  Filled 2021-07-27 (×6): qty 1

## 2021-07-27 MED ORDER — SODIUM CHLORIDE 0.9 % IV SOLN: 2.0000 g | INTRAVENOUS | Status: DC

## 2021-07-27 MED ORDER — MONTELUKAST SODIUM 10 MG PO TABS
10.0000 mg | ORAL_TABLET | Freq: Every day | ORAL | Status: DC
  Administered 2021-07-27 – 2021-08-01 (×6): 10 mg via ORAL
  Filled 2021-07-27 (×6): qty 1

## 2021-07-27 NOTE — Progress Notes (Signed)
Primary Care / Sports Medicine Office Visit  Patient Information:  Patient ID: [name], male DOB: 1955/12/16 Age: 65 y.o. MRN: [id number]   [name] is a pleasant 65 y.o. male presenting with the following:  Chief Complaint  Patient presents with   Bronchitis    X1 month; fever, productive cough, nausea; experiencing flank pain and cloudy urine; 5/10 pain    Review of Systems pertinent details above   Patient Active Problem List   Diagnosis Date Noted   SIRS (systemic inflammatory response syndrome) ([place]) 07/27/2021   Sepsis ([place]) 07/27/2021   Ureteral stricture 06/18/2021   Sacroiliitis ([place]) 05/07/2021   Spinal stenosis of lumbosacral region 01/18/2021   Herniated nucleus pulposus, lumbar 03/16/2020   Body mass index (BMI) 33.0-33.9, adult 09/03/2019   Other intervertebral disc displacement, lumbar region 06/25/2019   HNP (herniated nucleus pulposus), lumbar 06/12/2019   UTI due to Klebsiella species 05/08/2019   Biceps tendonitis on right 07/27/2018   Spinal stenosis, 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   Lumbago with sciatica, unspecified side 11/14/2017   Primary osteoarthritis of right knee 10/12/2017   Taking multiple medications for chronic disease 07/30/2015   Bronchitis 07/01/2015   Reactive airway disease 07/01/2015   Allergic sinusitis 07/01/2015   Essential (primary) hypertension 07/01/2015   Valvular heart disease 07/01/2015   Metatarsalgia of left foot 06/10/2015   [place] arthritis 12/15/2013   Past Medical History:  Diagnosis Date   Acquired deafness, right    per pt completed deafness right ear since age 50   Allergic rhinitis, seasonal    Anticoagulant long-term use    eliquis--- managed by cardiology   BPH associated with nocturia    urologist--- dr Tresa Moore   Chronic low back pain    DDD (degenerative disc disease), lumbosacral    [place]aminal stenosis of lumbosacral region    GERD (gastroesophageal reflux  disease)    Heart murmur    History of kidney stones    History of skin cancer    Hydronephrosis, left    Hyperlipidemia, mixed    Hypertension, essential    followed by pcp and cardiology   Hypokalemia    OA (osteoarthritis)    Paroxysmal atrial fibrillation ([place]) 01/28/2017   cardiologist--- dr Nehemiah Massed;   pt had ETT 03-16-2017 per cardio note normal, event monitor 02-09-2017 showed baseline SR some peroids  Afib with controlled rate with occ PAC/ PVC;  per pt had echo many yrs ago prior to AFib   Outpatient Encounter Medications as of 07/27/2021  Medication Sig   albuterol (VENTOLIN HFA) 108 (90 Base) MCG/ACT inhaler Inhale 2 puffs into the lungs every 6 (six) hours as needed for wheezing or shortness of breath.   diltiazem (CARDIZEM CD) 180 MG 24 hr capsule Take 1 capsule by mouth twice daily   fexofenadine (ALLEGRA) 180 MG tablet Take 1 tablet (180 mg total) by mouth daily.   fluticasone (FLONASE) 50 MCG/ACT nasal spray Use 1 spray(s) in each nostril once daily   Fluticasone-Umeclidin-Vilant (TRELEGY ELLIPTA) 100-62.5-25 MCG/INH AEPB Inhale 1 puff into the lungs daily.   hydrochlorothiazide (HYDRODIURIL) 12.5 MG tablet Take 1 tablet (12.5 mg total) by mouth daily.   lisinopril (ZESTRIL) 20 MG tablet Take 1 tablet (20 mg total) by mouth every evening. (Patient taking differently: Take 20 mg by mouth at bedtime.)   metoprolol succinate (TOPROL-XL) 25 MG 24 hr tablet Take 1 tablet (25 mg total) by mouth daily.   montelukast (SINGULAIR) 10 MG  tablet Take 1 tablet (10 mg total) by mouth at bedtime.   Olopatadine HCl 0.2 % SOLN Place 1 drop into both eyes 2 (two) times daily as needed (allergies.).   pantoprazole (PROTONIX) 40 MG tablet Take 1 tablet (40 mg total) by mouth daily. (Patient taking differently: Take 40 mg by mouth every evening.)   Phenazopyridine HCl (AZO URINARY PAIN PO) Take 1 capsule by mouth daily.   potassium chloride SA (KLOR-CON) 20 MEQ tablet Take 3 tablets (60 mEq  total) by mouth daily. (Patient taking differently: Take 20-40 mEq by mouth 2 (two) times daily. Takes one tablet in am and two tabs in pm)   predniSONE (DELTASONE) 10 MG tablet Take 1 tablet (10 mg total) by mouth daily with breakfast.   tadalafil (CIALIS) 5 MG tablet Take 1-2 tablets (5-10 mg total) by mouth daily as needed for erectile dysfunction.   tamsulosin (FLOMAX) 0.4 MG CAPS capsule TAKE ONE (1) CAPSULE EACH DAY. (Patient taking differently: Take 0.4 mg by mouth daily.)   docusate sodium (COLACE) 100 MG capsule Take 1 capsule (100 mg total) by mouth 2 (two) times daily. (Patient not taking: Reported on 07/27/2021)   doxycycline (VIBRA-TABS) 100 MG tablet Take 1 tablet (100 mg total) by mouth 2 (two) times daily. (Patient not taking: Reported on 07/27/2021)   HYDROcodone-acetaminophen (NORCO) 5-325 MG tablet Take 1-2 tablets by mouth every 6 (six) hours as needed for moderate pain. (Patient not taking: Reported on 07/27/2021)   senna-docusate (SENOKOT-S) 8.6-50 MG tablet Take 1 tablet by mouth 2 (two) times daily. While taking strong pain meds to prevent constipation (Patient not taking: Reported on 07/27/2021)   No facility-administered encounter medications on file as of 07/27/2021.   Past Surgical History:  Procedure Laterality Date   ABDOMINAL EXPOSURE N/A 01/18/2021   Procedure: ABDOMINAL EXPOSURE;  Surgeon: [name], MD;  Location: [facility];  Service: Vascular;  Laterality: N/A;   ANTERIOR LUMBAR FUSION N/A 01/18/2021   Procedure: Anterior Lumbar Interbody Fusion - Lumbar Five-Sarcal One;  Surgeon: Kary Kos, MD;  Location: [facility];  Service: Neurosurgery;  Laterality: N/A;  Anterior Lumbar Interbody Fusion - Lumbar Five-Sacral One   CYSTOSCOPY W/ URETERAL STENT PLACEMENT Left 06/18/2021   Procedure: CYSTOSCOPY WITH RETROGRADE PYELOGRAM/URETERAL STENT EXCHANGE;  Surgeon: Alexis Frock, MD;  Location: [facility];  Service: Urology;  Laterality: Left;   CYSTOSCOPY WITH RETROGRADE  PYELOGRAM, URETEROSCOPY AND STENT PLACEMENT Left 04/21/2021   Procedure: CYSTOSCOPY WITH RETROGRADE PYELOGRAM, DIAGNOSTICURETEROSCOPY AND STENT PLACEMENT;  Surgeon: Alexis Frock, MD;  Location: [facility];  Service: Urology;  Laterality: Left;  1 HR   EXTRACORPOREAL SHOCK WAVE LITHOTRIPSY     approx 2002   LUMBAR [place] SURGERY  2018   REPAIR DURAL / CSF LEAK  2018   post op lumbar diskectomy   REVISION TOTAL KNEE ARTHROPLASTY Left 2015   TOTAL KNEE ARTHROPLASTY Left 2012   TRANSFORAMINAL LUMBAR INTERBODY FUSION (TLIF) WITH PEDICLE SCREW FIXATION 1 LEVEL N/A 06/12/2019   Procedure: LUMBAR FOUR-FIVE TRANSFORAMINAL LUMBAR INTERBODY FUSION (TLIF) WITH PEDICLE SCREW FIXATION;  Surgeon: Kary Kos, MD;  Location: [facility];  Service: Neurosurgery;  Laterality: N/A;   UMBILICAL HERNIA REPAIR  1992    Vitals:   07/27/21 0852  BP: 118/74  Pulse: (!) 121  Resp: (!) 24  Temp: (!) 101.3 F (38.5 C)  SpO2: 93%   Vitals:   07/27/21 0852  Weight: 243 lb (110.2 kg)  Height: 6\' 1"  (1.854 m)   Body mass  index is 32.06 kg/m.  DG Chest 2 View  Result Date: 07/05/2021 CLINICAL DATA:  65 year old male with cough EXAM: CHEST - 2 VIEW COMPARISON:  12/03/2018 FINDINGS: Cardiomediastinal silhouette unchanged in size and contour. No evidence of central vascular congestion. No interlobular septal thickening. No pneumothorax or pleural effusion. Coarsened interstitial markings, with no confluent airspace disease. No acute displaced fracture. Degenerative changes of the spine. IMPRESSION: No active cardiopulmonary disease. Electronically Signed   By: Corrie Mckusick D.O.   On: 07/05/2021 14:08     Independent interpretation of notes and tests performed by another provider:   None  Procedures performed:   None  Pertinent History, Exam, Impression, and Recommendations:   Sepsis ([place]) 65 year old patient with a history of paroxysmal A. fib, hypertension, kidney stones status post left-sided  stenting 06/18/2021 presenting with 7-10 day history of concomitant shortness of air, productive cough, and bilateral flank pain with blood per urine and recent cloudy urine. He has been having fevers, sweats, and significant fatigue.   On examination he is febrile at 101.3, HR 121, respirations 24, saturation at 93%, and he is laying supine in mild distress secondary to effort, he is diaphoretic. Examination findings additionally pertinent for coarse lower lung fields, regular rhythm with tachycardic rate, mild suprapubic tenderness, CVA tenderness right greater than left.  Given that he has 3 SIRS criteria with 2 suspected sources of infection (lungs, kidney/bladder), I have advised the patient about concern for sepsis and what setting (outpatient vs inpatient) this condition is best managed. They are amenable to non-emergent transport to [facility]. I had an opportunity to speak with the patient's primary care provider Dr. Coralie Common to review pertinent medical history, current findings, and discuss management strategy. I did contact the [facility] ER triage nurse at [phone] after seeing the patient to relay the above clinical information and discuss management so there is no delay in care.   Orders & Medications No orders of the defined types were placed in this encounter.  No orders of the defined types were placed in this encounter.    Return if symptoms worsen or fail to improve.     Montel Culver, MD   Primary Care Sports Medicine [place]

## 2021-07-27 NOTE — ED Triage Notes (Signed)
Pt here from his doctor's office with possible sepsis. Pt had a fever at home but took tylenol at 0800. Pt states that he feels weak and is nauseous. Pt also states bilateral flank pain.

## 2021-07-27 NOTE — ED Provider Notes (Signed)
[facility] Emergency Department Provider Note    Event Date/Time   First MD Initiated Contact with Patient 07/27/21 1138     (approximate)  I have reviewed the triage vital signs and the nursing notes.   HISTORY  Chief Complaint Fever    HPI [name] is a 65 y.o. male below listed past medical history presents to the ER for evaluation of generalized malaise persistent cough some flank pain nausea as well as fevers.  Patient with recent ureteral stent placement was previously placed on doxycycline for case of bronchitis finished that course about 8 days ago started feeling worse today was actually scheduled to have his ureteral stent removed but in clinic was found to be febrile and tachycardic with presentation concerning for sepsis with so was sent to the ER.  Denies any focal pain or discomfort.  Has been having hematuria since stent placement.  Past Medical History:  Diagnosis Date   Acquired deafness, right    per pt completed deafness right ear since age 49   Allergic rhinitis, seasonal    Anticoagulant long-term use    eliquis--- managed by cardiology   BPH associated with nocturia    urologist--- dr Tresa Moore   Chronic low back pain    DDD (degenerative disc disease), lumbosacral    Foraminal stenosis of lumbosacral region    GERD (gastroesophageal reflux disease)    Heart murmur    History of kidney stones    History of skin cancer    Hydronephrosis, left    Hyperlipidemia, mixed    Hypertension, essential    followed by pcp and cardiology   Hypokalemia    OA (osteoarthritis)    Paroxysmal atrial fibrillation ([place]) 01/28/2017   cardiologist--- dr Nehemiah Massed;   pt had ETT 03-16-2017 per cardio note normal, event monitor 02-09-2017 showed baseline SR some peroids  Afib with controlled rate with occ PAC/ PVC;  per pt had echo many yrs ago prior to AFib   Family History  Problem Relation Age of Onset   Cancer Mother    Cancer Father     Cancer Brother    Hyperlipidemia Brother    Past Surgical History:  Procedure Laterality Date   ABDOMINAL EXPOSURE N/A 01/18/2021   Procedure: ABDOMINAL EXPOSURE;  Surgeon: [name], MD;  Location: [place];  Service: Vascular;  Laterality: N/A;   ANTERIOR LUMBAR FUSION N/A 01/18/2021   Procedure: Anterior Lumbar Interbody Fusion - Lumbar Five-Sarcal One;  Surgeon: Kary Kos, MD;  Location: [place];  Service: Neurosurgery;  Laterality: N/A;  Anterior Lumbar Interbody Fusion - Lumbar Five-Sacral One   CYSTOSCOPY W/ URETERAL STENT PLACEMENT Left 06/18/2021   Procedure: CYSTOSCOPY WITH RETROGRADE PYELOGRAM/URETERAL STENT EXCHANGE;  Surgeon: Alexis Frock, MD;  Location: [facility];  Service: Urology;  Laterality: Left;   CYSTOSCOPY WITH RETROGRADE PYELOGRAM, URETEROSCOPY AND STENT PLACEMENT Left 04/21/2021   Procedure: CYSTOSCOPY WITH RETROGRADE PYELOGRAM, DIAGNOSTICURETEROSCOPY AND STENT PLACEMENT;  Surgeon: Alexis Frock, MD;  [facility];  Service: Urology;  Laterality: Left;  1 HR   EXTRACORPOREAL SHOCK WAVE LITHOTRIPSY     approx 2002   LUMBAR [place] SURGERY  2018   REPAIR DURAL / CSF LEAK  2018   post op lumbar diskectomy   REVISION TOTAL KNEE ARTHROPLASTY Left 2015   TOTAL KNEE ARTHROPLASTY Left 2012   TRANSFORAMINAL LUMBAR INTERBODY FUSION (TLIF) WITH PEDICLE SCREW FIXATION 1 LEVEL N/A 06/12/2019   Procedure: LUMBAR FOUR-FIVE TRANSFORAMINAL LUMBAR INTERBODY FUSION (TLIF)  WITH PEDICLE SCREW FIXATION;  Surgeon: Kary Kos, MD;  Location: [place];  Service: Neurosurgery;  Laterality: N/A;   [place]   Patient Active Problem List   Diagnosis Date Noted   SIRS (systemic inflammatory response syndrome) ([place]) 07/27/2021   Sepsis ([place]) 07/27/2021   Ureteral stricture 06/18/2021   Sacroiliitis ([place]) 05/07/2021   Spinal stenosis of lumbosacral region 01/18/2021   Herniated nucleus pulposus, lumbar 03/16/2020   Body mass index (BMI) 33.0-33.9,  adult 09/03/2019   Other intervertebral disc displacement, lumbar region 06/25/2019   HNP (herniated nucleus pulposus), lumbar 06/12/2019   UTI due to Klebsiella species 05/08/2019   Biceps tendonitis on right 07/27/2018   Spinal stenosis, 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 Authorizing Provider  albuterol (VENTOLIN HFA) 108 (90 Base) MCG/ACT inhaler Inhale 2 puffs into the lungs every 6 (six) hours as needed for wheezing or shortness of breath. 07/14/21   Juline Patch, MD  diltiazem (CARDIZEM CD) 180 MG 24 hr capsule Take 1 capsule by mouth twice daily 07/27/21   Juline Patch, MD  docusate sodium (COLACE) 100 MG capsule Take 1 capsule (100 mg total) by mouth 2 (two) times daily. Patient not taking: Reported on 07/27/2021 06/18/21   Debbrah Alar, PA-C  doxycycline (VIBRA-TABS) 100 MG tablet Take 1 tablet (100 mg total) by mouth 2 (two) times daily. Patient not taking: Reported on 07/27/2021 07/08/21   Juline Patch, MD  fexofenadine (ALLEGRA) 180 MG tablet Take 1 tablet (180 mg total) by mouth daily. 03/04/21   Juline Patch, MD  fluticasone (FLONASE) 50 MCG/ACT nasal spray Use 1 spray(s) in each nostril once daily 07/14/21   Juline Patch, MD  Fluticasone-Umeclidin-Vilant (TRELEGY ELLIPTA) 100-62.5-25 MCG/INH AEPB Inhale 1 puff into the lungs daily.    [provider]  hydrochlorothiazide (HYDRODIURIL) 12.5 MG tablet Take 1 tablet (12.5 mg total) by mouth daily. 03/04/21    Juline Patch, MD  HYDROcodone-acetaminophen (NORCO) 5-325 MG tablet Take 1-2 tablets by mouth every 6 (six) hours as needed for moderate pain. Patient not taking: Reported on 07/27/2021 06/18/21   Debbrah Alar, PA-C  lisinopril (ZESTRIL) 20 MG tablet Take 1 tablet (20 mg total) by mouth every evening. Patient taking differently: Take 20 mg by mouth at bedtime. 03/04/21   Juline Patch, MD  metoprolol succinate (TOPROL-XL) 25 MG 24 hr tablet Take 1 tablet (25 mg total) by mouth daily. 06/08/21   Juline Patch, MD  montelukast (SINGULAIR) 10 MG tablet Take 1 tablet (10 mg total) by mouth at bedtime. 03/04/21   Juline Patch, MD  Olopatadine HCl 0.2 % SOLN Place 1 drop into both eyes 2 (two) times daily as needed (allergies.).    [provider]  pantoprazole (PROTONIX) 40 MG tablet Take 1 tablet (40 mg total) by mouth daily. Patient taking differently: Take 40 mg by mouth every evening. 03/04/21   Juline Patch, MD  Phenazopyridine HCl (AZO URINARY PAIN PO) Take 1 capsule by mouth  daily.    [provider]  potassium chloride SA (KLOR-CON) 20 MEQ tablet Take 3 tablets (60 mEq total) by mouth daily. Patient taking differently: Take 20-40 mEq by mouth 2 (two) times daily. Takes one tablet in am and two tabs in pm 03/04/21   Juline Patch, MD  predniSONE (DELTASONE) 10 MG tablet Take 1 tablet (10 mg total) by mouth daily with breakfast. 07/14/21   Juline Patch, MD  senna-docusate (SENOKOT-S) 8.6-50 MG tablet Take 1 tablet by mouth 2 (two) times daily. While taking strong pain meds to prevent constipation Patient not taking: Reported on 07/27/2021 04/21/21   Alexis Frock, MD  tadalafil (CIALIS) 5 MG tablet Take 1-2 tablets (5-10 mg total) by mouth daily as needed for erectile dysfunction. 09/22/20   [name], MD  tamsulosin (FLOMAX) 0.4 MG CAPS capsule TAKE ONE (1) CAPSULE EACH DAY. Patient taking differently: Take 0.4 mg by mouth daily. 02/10/21   [name], MD     Allergies Meloxicam    Social History Social History   Tobacco Use   Smoking status: Never   Smokeless tobacco: Former    Types: Chew    Quit date: 03/2020  Vaping Use   Vaping Use: Never used  Substance Use Topics   Alcohol use: Yes    Comment: ocassional   Drug use: Never    Review of Systems Patient denies headaches, rhinorrhea, blurry vision, numbness, shortness of breath, chest pain, edema, cough, abdominal pain, nausea, vomiting, diarrhea, dysuria, fevers, rashes or hallucinations unless otherwise stated above in HPI. ____________________________________________   PHYSICAL EXAM:  VITAL SIGNS: Vitals:   07/27/21 1032 07/27/21 1130  BP: 120/83 124/84  Pulse: (!) 110 (!) 102  Resp: 18 18  Temp: 99.7 F (37.6 C)   SpO2: 96% 96%    Constitutional: Alert and oriented.  Eyes: Conjunctivae are normal.  Head: Atraumatic. Nose: No congestion/rhinnorhea. Mouth/Throat: Mucous membranes are moist.   Neck: No stridor. Painless ROM.  Cardiovascular: Normal rate, regular rhythm. Grossly normal heart sounds.  Good peripheral circulation. Respiratory: Normal respiratory effort.  No retractions. Lungs CTAB. Gastrointestinal: Soft and nontender. No distention. No abdominal bruits. No CVA tenderness. Genitourinary:  Musculoskeletal: No lower extremity tenderness nor edema.  No joint effusions. Neurologic:  Normal speech and language. No gross focal neurologic deficits are appreciated. No facial droop Skin:  Skin is warm, dry and intact. No rash noted. Psychiatric: Mood and affect are normal. Speech and behavior are normal.  ____________________________________________   LABS (all labs ordered are listed, but only abnormal results are displayed)  Results for orders placed or performed during the hospital encounter of 07/27/21 (from the past 24 hour(s))  Comprehensive metabolic panel     Status: Abnormal   Collection Time: 07/27/21 10:43 AM  Result Value Ref Range    Sodium 133 (L) 135 - 145 mmol/L   Potassium 3.4 (L) 3.5 - 5.1 mmol/L   Chloride 98 98 - 111 mmol/L   CO2 24 22 - 32 mmol/L   Glucose, Bld 182 (H) 70 - 99 mg/dL   BUN 14 8 - 23 mg/dL   Creatinine, Ser 1.12 0.61 - 1.24 mg/dL   Calcium 8.9 8.9 - 10.3 mg/dL   Total Protein 7.5 6.5 - 8.1 g/dL   Albumin 3.5 3.5 - 5.0 g/dL   AST 23 15 - 41 U/L   ALT 24 0 - 44 U/L   Alkaline Phosphatase 73 38 - 126 U/L   Total Bilirubin 1.1 0.3 -  1.2 mg/dL   GFR, Estimated >60 >60 mL/min   Anion gap 11 5 - 15  CBC with Differential     Status: Abnormal   Collection Time: 07/27/21 10:43 AM  Result Value Ref Range   WBC 15.4 (H) 4.0 - 10.5 K/uL   RBC 5.06 4.22 - 5.81 MIL/uL   Hemoglobin 12.3 (L) 13.0 - 17.0 g/dL   HCT 39.8 39.0 - 52.0 %   MCV 78.7 (L) 80.0 - 100.0 fL   MCH 24.3 (L) 26.0 - 34.0 pg   MCHC 30.9 30.0 - 36.0 g/dL   RDW 17.2 (H) 11.5 - 15.5 %   Platelets 199 150 - 400 K/uL   nRBC 0.0 0.0 - 0.2 %   Neutrophils Relative % 90 %   Neutro Abs 13.7 (H) 1.7 - 7.7 K/uL   Lymphocytes Relative 5 %   Lymphs Abs 0.8 0.7 - 4.0 K/uL   Monocytes Relative 4 %   Monocytes Absolute 0.6 0.1 - 1.0 K/uL   Eosinophils Relative 0 %   Eosinophils Absolute 0.0 0.0 - 0.5 K/uL   Basophils Relative 0 %   Basophils Absolute 0.0 0.0 - 0.1 K/uL   Immature Granulocytes 1 %   Abs Immature Granulocytes 0.21 (H) 0.00 - 0.07 K/uL  Protime-INR     Status: Abnormal   Collection Time: 07/27/21 10:43 AM  Result Value Ref Range   Prothrombin Time 19.7 (H) 11.4 - 15.2 seconds   INR 1.7 (H) 0.8 - 1.2  Lactic acid, plasma     Status: Abnormal   Collection Time: 07/27/21 10:44 AM  Result Value Ref Range   Lactic Acid, Venous 2.5 (HH) 0.5 - 1.9 mmol/L  Urinalysis, Complete w Microscopic     Status: Abnormal   Collection Time: 07/27/21 10:44 AM  Result Value Ref Range   Color, Urine AMBER (A) YELLOW   APPearance CLOUDY (A) CLEAR   Specific Gravity, Urine 1.020 1.005 - 1.030   pH 6.0 5.0 - 8.0   Glucose, UA NEGATIVE  NEGATIVE mg/dL   Hgb urine dipstick MODERATE (A) NEGATIVE   Bilirubin Urine NEGATIVE NEGATIVE   Ketones, ur NEGATIVE NEGATIVE mg/dL   Protein, ur 100 (A) NEGATIVE mg/dL   Nitrite POSITIVE (A) NEGATIVE   Leukocytes,Ua LARGE (A) NEGATIVE   RBC / HPF 21-50 0 - 5 RBC/hpf   WBC, UA >50 (H) 0 - 5 WBC/hpf   Bacteria, UA MANY (A) NONE SEEN   Squamous Epithelial / LPF NONE SEEN 0 - 5   WBC Clumps PRESENT    Mucus PRESENT    Non Squamous Epithelial PRESENT (A) NONE SEEN   ____________________________________________  EKG My review and personal interpretation at Time: 10:38   Indication: sepsis  Rate: 110  Rhythm: sinus Axis: normal Other: normal intervals, no stemi ____________________________________________  RADIOLOGY  I personally reviewed all radiographic images ordered to evaluate for the above acute complaints and reviewed radiology reports and findings.  These findings were personally discussed with the patient.  Please see medical record for radiology report.  ____________________________________________   PROCEDURES  Procedure(s) performed:  Procedures    Critical Care performed: no ____________________________________________   INITIAL IMPRESSION / ASSESSMENT AND PLAN / ED COURSE  Pertinent labs & imaging results that were available during my care of the patient were reviewed by me and considered in my medical decision making (see chart for details).   DDX: sepsis, uti, pna, bronchitis, covid, dehydration, electrolyte abn  [name] is a 65 y.o. who presents  to the ED with presentation as described above.  Presentation is complicated by recent antibiotic use as well as recent ureteral stent placement.  Does have white count mild lactate elevation as well given IV fluids he is not hypotensive currently.  Did have fever this morning as well as tachycardia but that has improved after antipyretics.  His abdominal exam is soft and benign.  No sign of infiltrate on  chest x-ray.  Urinalysis is consistent with infection with many bacteria.  Patient was given IV antibiotics to cover for urosepsis.  Will discuss with hospitalist for admission.     The patient was evaluated in Emergency Department today for the symptoms described in the history of present illness. He/she was evaluated in the context of the global COVID-19 pandemic, which necessitated consideration that the patient might be at risk for infection with the SARS-CoV-2 virus that causes COVID-19. Institutional protocols and algorithms that pertain to the evaluation of patients at risk for COVID-19 are in a state of rapid change based on information released by regulatory bodies including the CDC and federal and state organizations. These policies and algorithms were followed during the patient's care in the ED.  As part of my medical decision making, I reviewed the following data within the [place] notes reviewed and incorporated, Labs reviewed, notes from prior ED visits and [place] Controlled Substance Database   ____________________________________________   FINAL CLINICAL IMPRESSION(S) / ED DIAGNOSES  Final diagnoses:  Sepsis without acute organ dysfunction, due to unspecified organism [facility])  Urinary tract infection with hematuria, site unspecified      NEW MEDICATIONS STARTED DURING THIS VISIT:  New Prescriptions   No medications on file     Note:  This document was prepared using Dragon voice recognition software and may include unintentional dictation errors.    Merlyn Lot, MD 07/27/21 1204

## 2021-07-27 NOTE — Progress Notes (Signed)
PHARMACY NOTE:  ANTIMICROBIAL RENAL DOSAGE ADJUSTMENT  Current antimicrobial regimen includes a mismatch between antimicrobial dosage and estimated renal function.  As per policy approved by the Pharmacy & Therapeutics and Medical Executive Committees, the antimicrobial dosage will be adjusted accordingly.  Current antimicrobial dosage:  Cefepime 2g IV Q24 hours  Indication: Sepsis  Renal Function:  Estimated Creatinine Clearance: 82.8 mL/min (by C-G formula based on SCr of 1.12 mg/dL).     Antimicrobial dosage has been changed to:  Cefepime 2g IV Q8 hours    Thank you for allowing pharmacy to be a part of this patient's care.  Pearla Dubonnet, [facility] 07/27/2021 7:50 PM

## 2021-07-27 NOTE — Progress Notes (Signed)
PHARMACY -  BRIEF ANTIBIOTIC NOTE   Pharmacy has received consult(s) for cefepime from an ED provider.  The patient's profile has been reviewed for ht/wt/allergies/indication/available labs.    One time order(s) placed for Cefepime 2 g  Further antibiotics/pharmacy consults should be ordered by admitting physician if indicated.                       Thank you, Forde [place] Matthis 07/27/2021  11:50 AM

## 2021-07-27 NOTE — ED Notes (Signed)
Critical lactic acid 2.5

## 2021-07-27 NOTE — H&P (Signed)
History and Physical    [name] CWC:[id number] DOB: [birth year] DOA: 07/27/2021  PCP: Juline Patch, MD (Confirm with patient/family/NH records and if not entered, this has to be entered at [facility] point of entry) Patient coming from: home - [place] office  I have personally briefly reviewed patient's old medical records in [place]  Chief Complaint: fever, tachycardia.  HPI: [name] is a 65 y.o. male with medical history significant of paroxysmal A. fib, hypertension, kidney stones status post left-sided stenting 06/18/2021 presenting to PCP office with 7-10 day history of concomitant shortness of air, productive cough, and bilateral flank pain with blood per urine and recent cloudy urine. He has been having fevers, sweats, and significant fatigue. Of note he just recently completed course of abx for bronchitis.   On examination in PCP office he is febrile at 101.3, HR 121, respirations 24, saturation at 93%, and he is laying supine in mild distress secondary to effort, he is diaphoretic. Examination findings additionally pertinent for coarse lower lung fields, regular rhythm with tachycardic rate, mild suprapubic tenderness, CVA tenderness right greater than left.   Given that he has 3 SIRS criteria with 2 suspected sources of infection (lungs, kidney/bladder) PCP advised the patient about concern for sepsis and what setting (outpatient vs inpatient) this condition is best managed. They are amenable to non-emergent transport to [facility] ER   ED Course: Tmax 101.3  118/74  HRmax 121 to 102  RR 18. Lab revealed K 3.4, glucose 182, Cr 1.12, WBC 15.4 with 90/5/4. U/A w/ many bacteria, >50 WBC/hpf, 21-50 RBC/hpf. CXR- NAD. EKG Sinus tachycardia. Code sepsis was activated: patient received 1 L bolus NS then maintenance fluids, Cefipime 2g give IV, blood and urine specimens sent for culture. TRH called ot admit for continued management.  Review of Systems: As per HPI  otherwise 10 point review of systems negative.    Past Medical History:  Diagnosis Date   Acquired deafness, right    per pt completed deafness right ear since age 60   Allergic rhinitis, seasonal    Anticoagulant long-term use    eliquis--- managed by cardiology   BPH associated with nocturia    urologist--- dr Tresa Moore   Chronic low back pain    DDD (degenerative disc disease), lumbosacral    Foraminal stenosis of lumbosacral region    GERD (gastroesophageal reflux disease)    Heart murmur    History of kidney stones    History of skin cancer    Hydronephrosis, left    Hyperlipidemia, mixed    Hypertension, essential    followed by pcp and cardiology   Hypokalemia    OA (osteoarthritis)    Paroxysmal atrial fibrillation ([place]) 01/28/2017   cardiologist--- dr Nehemiah Massed;   pt had ETT 03-16-2017 per cardio note normal, event monitor 02-09-2017 showed baseline SR some peroids  Afib with controlled rate with occ PAC/ PVC;  per pt had echo many yrs ago prior to AFib    Past Surgical History:  Procedure Laterality Date   ABDOMINAL EXPOSURE N/A 01/18/2021   Procedure: ABDOMINAL EXPOSURE;  Surgeon: [name], MD;  Location: [place];  Service: Vascular;  Laterality: N/A;   ANTERIOR LUMBAR FUSION N/A 01/18/2021   Procedure: Anterior Lumbar Interbody Fusion - Lumbar Five-Sarcal One;  Surgeon: Kary Kos, MD;  Location: [place];  Service: Neurosurgery;  Laterality: N/A;  Anterior Lumbar Interbody Fusion - Lumbar Five-Sacral One   CYSTOSCOPY W/ URETERAL STENT PLACEMENT Left 06/18/2021  Procedure: CYSTOSCOPY WITH RETROGRADE PYELOGRAM/URETERAL STENT EXCHANGE;  Surgeon: Alexis Frock, MD;  Location: [facility];  Service: Urology;  Laterality: Left;   CYSTOSCOPY WITH RETROGRADE PYELOGRAM, URETEROSCOPY AND STENT PLACEMENT Left 04/21/2021   Procedure: CYSTOSCOPY WITH RETROGRADE PYELOGRAM, DIAGNOSTICURETEROSCOPY AND STENT PLACEMENT;  Surgeon: Alexis Frock, MD;  Location: [facility];  Service: Urology;  Laterality: Left;  1 HR   EXTRACORPOREAL SHOCK WAVE LITHOTRIPSY     approx 2002   LUMBAR [place] SURGERY  2018   REPAIR DURAL / CSF LEAK  2018   post op lumbar diskectomy   REVISION TOTAL KNEE ARTHROPLASTY Left 2015   TOTAL KNEE ARTHROPLASTY Left 2012   TRANSFORAMINAL LUMBAR INTERBODY FUSION (TLIF) WITH PEDICLE SCREW FIXATION 1 LEVEL N/A 06/12/2019   Procedure: LUMBAR FOUR-FIVE TRANSFORAMINAL LUMBAR INTERBODY FUSION (TLIF) WITH PEDICLE SCREW FIXATION;  Surgeon: Kary Kos, MD;  Location: [place];  Service: Neurosurgery;  Laterality: N/A;   UMBILICAL HERNIA REPAIR  1992    Soc Hx - married 62 years, lives with wife. He is Radio producer.   reports that he has never smoked. He quit smokeless tobacco use about 16 months ago.  His smokeless tobacco use included chew. He reports current alcohol use. He reports that he does not use drugs.  Allergies  Allergen Reactions   Meloxicam Palpitations    Family History  Problem Relation Age of Onset   Cancer Mother    Cancer Father    Cancer Brother    Hyperlipidemia Brother      Prior to Admission medications   Medication Sig Start Date End Date Taking? Authorizing Provider  albuterol (VENTOLIN HFA) 108 (90 Base) MCG/ACT inhaler Inhale 2 puffs into the lungs every 6 (six) hours as needed for wheezing or shortness of breath. 07/14/21   Juline Patch, MD  diltiazem (CARDIZEM CD) 180 MG 24 hr capsule Take 1 capsule by mouth twice daily 07/27/21   Juline Patch, MD  docusate sodium (COLACE) 100 MG capsule Take 1 capsule (100 mg total) by mouth 2 (two) times daily. Patient not taking: Reported on 07/27/2021 06/18/21   Debbrah Alar, PA-C  doxycycline (VIBRA-TABS) 100 MG tablet Take 1 tablet (100 mg total) by mouth 2 (two) times daily. Patient not taking: Reported on 07/27/2021 07/08/21   Juline Patch, MD  fexofenadine (ALLEGRA) 180 MG tablet Take 1 tablet (180 mg total) by mouth daily. 03/04/21   Juline Patch, MD  fluticasone (FLONASE) 50 MCG/ACT nasal spray Use 1 spray(s) in each nostril once daily 07/14/21   Juline Patch, MD  Fluticasone-Umeclidin-Vilant (TRELEGY ELLIPTA) 100-62.5-25 MCG/INH AEPB Inhale 1 puff into the lungs daily.    [provider]  hydrochlorothiazide (HYDRODIURIL) 12.5 MG tablet Take 1 tablet (12.5 mg total) by mouth daily. 03/04/21   Juline Patch, MD  HYDROcodone-acetaminophen (NORCO) 5-325 MG tablet Take 1-2 tablets by mouth every 6 (six) hours as needed for moderate pain. Patient not taking: Reported on 07/27/2021 06/18/21   Debbrah Alar, PA-C  lisinopril (ZESTRIL) 20 MG tablet Take 1 tablet (20 mg total) by mouth every evening. Patient taking differently: Take 20 mg by mouth at bedtime. 03/04/21   Juline Patch, MD  metoprolol succinate (TOPROL-XL) 25 MG 24 hr tablet Take 1 tablet (25 mg total) by mouth daily. 06/08/21   Juline Patch, MD  montelukast (SINGULAIR) 10 MG tablet Take 1 tablet (10 mg total) by mouth at bedtime. 03/04/21   Juline Patch, MD  Olopatadine HCl 0.2 % SOLN Place 1 drop into both eyes 2 (two) times daily as needed (allergies.).    [provider]  pantoprazole (PROTONIX) 40 MG tablet Take 1 tablet (40 mg total) by mouth daily. Patient taking differently: Take 40 mg by mouth every evening. 03/04/21   Juline Patch, MD  Phenazopyridine HCl (AZO URINARY PAIN PO) Take 1 capsule by mouth daily.    [provider]  potassium chloride SA (KLOR-CON) 20 MEQ tablet Take 3 tablets (60 mEq total) by mouth daily. Patient taking differently: Take 20-40 mEq by mouth 2 (two) times daily. Takes one tablet in am and two tabs in pm 03/04/21   Juline Patch, MD  predniSONE (DELTASONE) 10 MG tablet Take 1 tablet (10 mg total) by mouth daily with breakfast. 07/14/21   Juline Patch, MD  senna-docusate (SENOKOT-S) 8.6-50 MG tablet Take 1 tablet by mouth 2 (two) times daily. While taking strong pain meds to prevent  constipation Patient not taking: Reported on 07/27/2021 04/21/21   Alexis Frock, MD  tadalafil (CIALIS) 5 MG tablet Take 1-2 tablets (5-10 mg total) by mouth daily as needed for erectile dysfunction. 09/22/20   [name], MD  tamsulosin (FLOMAX) 0.4 MG CAPS capsule TAKE ONE (1) CAPSULE EACH DAY. Patient taking differently: Take 0.4 mg by mouth daily. 02/10/21   [name], MD    Physical Exam: Vitals:   07/27/21 1032 07/27/21 1033 07/27/21 1130  BP: 120/83  124/84  Pulse: (!) 110  (!) 102  Resp: 18  18  Temp: 99.7 F (37.6 C)    TempSrc: Oral    SpO2: 96%  96%  Weight:  108.9 kg   Height:  6\' 1"  (1.854 m)      Vitals:   07/27/21 1032 07/27/21 1033 07/27/21 1130  BP: 120/83  124/84  Pulse: (!) 110  (!) 102  Resp: 18  18  Temp: 99.7 F (37.6 C)    TempSrc: Oral    SpO2: 96%  96%  Weight:  108.9 kg   Height:  6\' 1"  (1.854 m)    General: overweight man in no acute distress Eyes: PERRL, lids and conjunctivae normal ENMT: Mucous membranes are moist. Posterior pharynx clear of any exudate or lesions.Normal dentition.  Neck: normal, supple, no masses, no thyromegaly Respiratory: clear to auscultation bilaterally, no wheezing, no crackles. Normal respiratory effort. No accessory muscle use.  Cardiovascular: Regular tachycardia, no murmurs / rubs / gallops. No extremity edema. 2+ pedal pulses. No carotid bruits.  Abdomen: obese, no tenderness, no masses palpated. No hepatosplenomegaly. Bowel sounds hypoactive.  Musculoskeletal: no clubbing / cyanosis. No joint deformity upper and lower extremities. Good ROM, no contractures. Normal muscle tone.  Skin: no rashes, lesions, ulcers. No induration Neurologic: CN 2-12 grossly intact. Sensation intact, DTR normal. Strength 5/5 in all 4.  Psychiatric: Normal judgment and insight. Alert and oriented x 3. Normal mood.     Labs on Admission: I have personally reviewed following labs and imaging studies  CBC: Recent Labs   Lab 07/27/21 1043  WBC 15.4*  NEUTROABS 13.7*  HGB 12.3*  HCT 39.8  MCV 78.7*  PLT 657   Basic Metabolic Panel: Recent Labs  Lab 07/27/21 1043  NA 133*  K 3.4*  CL 98  CO2 24  GLUCOSE 182*  BUN 14  CREATININE 1.12  CALCIUM 8.9   GFR: Estimated Creatinine Clearance: 82.8 mL/min (by C-G formula based on SCr of 1.12 mg/dL). Liver Function Tests:  Recent Labs  Lab 07/27/21 1043  AST 23  ALT 24  ALKPHOS 73  BILITOT 1.1  PROT 7.5  ALBUMIN 3.5   No results for input(s): LIPASE, AMYLASE in the last 168 hours. No results for input(s): AMMONIA in the last 168 hours. Coagulation Profile: Recent Labs  Lab 07/27/21 1043  INR 1.7*   Cardiac Enzymes: No results for input(s): CKTOTAL, CKMB, CKMBINDEX, TROPONINI in the last 168 hours. BNP (last 3 results) No results for input(s): PROBNP in the last 8760 hours. HbA1C: No results for input(s): HGBA1C in the last 72 hours. CBG: No results for input(s): GLUCAP in the last 168 hours. Lipid Profile: No results for input(s): CHOL, HDL, LDLCALC, TRIG, CHOLHDL, LDLDIRECT in the last 72 hours. Thyroid Function Tests: No results for input(s): TSH, T4TOTAL, FREET4, T3FREE, THYROIDAB in the last 72 hours. Anemia Panel: No results for input(s): VITAMINB12, FOLATE, FERRITIN, TIBC, IRON, RETICCTPCT in the last 72 hours. Urine analysis:    Component Value Date/Time   COLORURINE AMBER (A) 07/27/2021 1044   APPEARANCEUR CLOUDY (A) 07/27/2021 1044   APPEARANCEUR Clear 06/05/2019 1117   LABSPEC 1.020 07/27/2021 1044   PHURINE 6.0 07/27/2021 1044   GLUCOSEU NEGATIVE 07/27/2021 1044   HGBUR MODERATE (A) 07/27/2021 1044   BILIRUBINUR NEGATIVE 07/27/2021 1044   BILIRUBINUR negative 07/02/2021 1024   BILIRUBINUR Negative 06/05/2019 1117   KETONESUR NEGATIVE 07/27/2021 1044   PROTEINUR 100 (A) 07/27/2021 1044   UROBILINOGEN negative (A) 07/02/2021 1024   NITRITE POSITIVE (A) 07/27/2021 1044   LEUKOCYTESUR LARGE (A) 07/27/2021 1044     Radiological Exams on Admission: DG Chest 2 View  Result Date: 07/27/2021 CLINICAL DATA:  Pt here from his doctor's office with possible sepsis. Pt had a fever at home but took tylenol at 0800. Pt states that he feels weak and is nauseous. Pt also states bilateral flank painSuspected Sepsis EXAM: CHEST - 2 VIEW COMPARISON:  07/05/2021 FINDINGS: Normal mediastinum and cardiac silhouette. Normal pulmonary vasculature. No evidence of effusion, infiltrate, or pneumothorax. No acute bony abnormality. IMPRESSION: No acute cardiopulmonary process. Electronically Signed   By: Suzy Bouchard M.D.   On: 07/27/2021 11:27    EKG: Independently reviewed. Sinus tachycardia w/o acute changes  Assessment/Plan Active Problems:   Sepsis secondary to UTI ([place])   Reactive airway disease   Essential (primary) hypertension   Ureteral stricture    Urosepsis,severe - patient with lactic acidosis, leukocytosis, tachycardia, fever. He has a ureteral stent in place. He was resuscitated with 1L NS and administered 2g cefipime in ED Plan Med-surg admit - no cardiac issues or symptoms, no indication for telemetry  Continue Cefipime IV  Continue IVF  F/u CBC, Bmet in AM  2. HTN- continue home medications  3. Reactive airway disease - normal respiratory function Plan Continue home regimen  4. Urology - patient with ureteral stent. Patient was scheduled for cystoscopy for stent retrieval per Dr. Tresa Moore. [facility] notified of patient's admission.  DVT prophylaxis: lovenox  Code Status: full code  Family Communication: wife present during interview and exam. All questions answered.  Disposition Plan: home when medically stable  Consults called: none Admission status: inpatient    Adella Hare MD [facility] Pager [phone]  If 7PM-7AM, please contact night-coverage [email or web address] Password [facility]  07/27/2021, 12:34 PM

## 2021-07-27 NOTE — Assessment & Plan Note (Addendum)
65 year old patient with a history of paroxysmal A. fib, hypertension, kidney stones status post left-sided stenting 06/18/2021 presenting with 7-10 day history of concomitant shortness of air, productive cough, and bilateral flank pain with blood per urine and recent cloudy urine. He has been having fevers, sweats, and significant fatigue.   On examination he is febrile at 101.3, HR 121, respirations 24, saturation at 93%, and he is laying supine in mild distress secondary to effort, he is diaphoretic. Examination findings additionally pertinent for coarse lower lung fields, regular rhythm with tachycardic rate, mild suprapubic tenderness, CVA tenderness right greater than left.  Given that he has 3 SIRS criteria with 2 suspected sources of infection (lungs, kidney/bladder), I have advised the patient about concern for sepsis and what setting (outpatient vs inpatient) this condition is best managed. They are amenable to non-emergent transport to [facility] ER. I had an opportunity to speak with the patient's primary care provider Dr. Coralie Common to review pertinent medical history, current findings, and discuss management strategy. I did contact the [facility] ER triage nurse at [phone] after seeing the patient to relay the above clinical information and discuss management so there is no delay in care.

## 2021-07-27 NOTE — ED Notes (Signed)
Pt states he is not in a lot of pain it comes and goes in his flank area bilaterally. Pt just states he feels like "Crap" call bell in reach. Wife at bedside. Bed positioned for comfort. Denies any other needs at this time.

## 2021-07-27 NOTE — Patient Instructions (Signed)
-   Proceed to [facility] hospital ER

## 2021-07-28 ENCOUNTER — Inpatient Hospital Stay: Payer: Medicare PPO

## 2021-07-28 ENCOUNTER — Other Ambulatory Visit: Payer: Self-pay

## 2021-07-28 ENCOUNTER — Encounter: Payer: Self-pay | Admitting: Internal Medicine

## 2021-07-28 DIAGNOSIS — N390 Urinary tract infection, site not specified: Secondary | ICD-10-CM | POA: Diagnosis not present

## 2021-07-28 DIAGNOSIS — I482 Chronic atrial fibrillation, unspecified: Secondary | ICD-10-CM

## 2021-07-28 DIAGNOSIS — I10 Essential (primary) hypertension: Secondary | ICD-10-CM | POA: Diagnosis not present

## 2021-07-28 DIAGNOSIS — A419 Sepsis, unspecified organism: Secondary | ICD-10-CM | POA: Diagnosis not present

## 2021-07-28 LAB — CBC
HCT: 36.1 % — ABNORMAL LOW (ref 39.0–52.0)
Hemoglobin: 11.5 g/dL — ABNORMAL LOW (ref 13.0–17.0)
MCH: 25.3 pg — ABNORMAL LOW (ref 26.0–34.0)
MCHC: 31.9 g/dL (ref 30.0–36.0)
MCV: 79.5 fL — ABNORMAL LOW (ref 80.0–100.0)
Platelets: 165 10*3/uL (ref 150–400)
RBC: 4.54 MIL/uL (ref 4.22–5.81)
RDW: 16.9 % — ABNORMAL HIGH (ref 11.5–15.5)
WBC: 14.1 10*3/uL — ABNORMAL HIGH (ref 4.0–10.5)
nRBC: 0 % (ref 0.0–0.2)

## 2021-07-28 LAB — BASIC METABOLIC PANEL
Anion gap: 4 — ABNORMAL LOW (ref 5–15)
BUN: 21 mg/dL (ref 8–23)
CO2: 25 mmol/L (ref 22–32)
Calcium: 8.5 mg/dL — ABNORMAL LOW (ref 8.9–10.3)
Chloride: 110 mmol/L (ref 98–111)
Creatinine, Ser: 1.1 mg/dL (ref 0.61–1.24)
GFR, Estimated: >60 mL/min (ref >60)
Glucose, Bld: 262 mg/dL — ABNORMAL HIGH (ref 70–99)
Potassium: 4.2 mmol/L (ref 3.5–5.1)
Sodium: 139 mmol/L (ref 135–145)

## 2021-07-28 LAB — HIV ANTIBODY (ROUTINE TESTING W REFLEX): HIV Screen 4th Generation wRfx: REACTIVE — AB

## 2021-07-28 NOTE — Progress Notes (Addendum)
PROGRESS NOTE    [name]  TXM:[id number] DOB: 25-Jan-1956 DOA: 07/27/2021 PCP: Juline Patch, MD   Assessment & Plan:   Active Problems:   Reactive airway disease   Essential (primary) hypertension   Ureteral stricture   Sepsis secondary to UTI ([place])   Sepsis: met criteria w/ lactic acidosis, leukocytosis, tachycardia, fever & likely UTI. Has ureteral stent in place. Continue on IV cefepime. Urology consulted       UTI: urine cx is pending. Continue on IV cefepime. Has ureteral stent in place. Urology consulted    HTN: continue on diltiazem    Reactive airway disease: continue on bronchodilators. Encourage incentive spirometry   PAF: continue on home dose of diltiazem, eliquis    Obesity: BMI 31.6. Complicates overall care and prognosis    DVT prophylaxis: eliquis  Code Status:  full  Family Communication: Disposition Plan:   Level of care: Med-Surg  Status is: Inpatient  Remains inpatient appropriate because:IV treatments appropriate due to intensity of illness or inability to take PO and Inpatient level of care appropriate due to severity of illness  Dispo: The patient is from: Home              Anticipated d/c is to: Home              Patient currently is not medically stable to d/c.   Difficult to place patient : unclear        Consultants:  Urology: Dr. Erlene Quan   Procedures:  Antimicrobials: cefepime    Subjective: Pt c/o malaise   Objective: Vitals:   07/27/21 1856 07/27/21 2027 07/28/21 0452 07/28/21 0819  BP:  (!) 116/97 103/76 131/86  Pulse:  82 97 75  Resp:  (!) 22 18 18   Temp: (!) 101 F (38.3 C) 98.6 F (37 C) (!) 97.4 F (36.3 C) 98.7 F (37.1 C)  TempSrc: Oral Oral Oral Oral  SpO2:  95% 97% 95%  Weight:      Height:        Intake/Output Summary (Last 24 hours) at 07/28/2021 0856 Last data filed at 07/28/2021 0500 Gross per 24 hour  Intake 1025.71 ml  Output 500 ml  Net 525.71 ml   Filed Weights   07/27/21  1033  Weight: 108.9 kg    Examination:  General exam: Appears calm and comfortable  Respiratory system: Clear to auscultation. Respiratory effort normal. Cardiovascular system: S1 & S+. No  rubs, gallops or clicks. Gastrointestinal system: Abdomen is obese, soft and nontender.  Normal bowel sounds heard. Central nervous system: Alert and oriented. Moves all extremities  Psychiatry: Judgement and insight appear normal. Mood & affect appropriate.     Data Reviewed: I have personally reviewed following labs and imaging studies  CBC: Recent Labs  Lab 07/27/21 1043 07/28/21 0431  WBC 15.4* 14.1*  NEUTROABS 13.7*  --   HGB 12.3* 11.5*  HCT 39.8 36.1*  MCV 78.7* 79.5*  PLT 199 482   Basic Metabolic Panel: Recent Labs  Lab 07/27/21 1043 07/28/21 0431  NA 133* 139  K 3.4* 4.2  CL 98 110  CO2 24 25  GLUCOSE 182* 262*  BUN 14 21  CREATININE 1.12 1.10  CALCIUM 8.9 8.5*   GFR: Estimated Creatinine Clearance: 84.3 mL/min (by C-G formula based on SCr of 1.1 mg/dL). Liver Function Tests: Recent Labs  Lab 07/27/21 1043  AST 23  ALT 24  ALKPHOS 73  BILITOT 1.1  PROT 7.5  ALBUMIN 3.5   No  results for input(s): LIPASE, AMYLASE in the last 168 hours. No results for input(s): AMMONIA in the last 168 hours. Coagulation Profile: Recent Labs  Lab 07/27/21 1043 07/27/21 1354  INR 1.7* 2.2*   Cardiac Enzymes: No results for input(s): CKTOTAL, CKMB, CKMBINDEX, TROPONINI in the last 168 hours. BNP (last 3 results) No results for input(s): PROBNP in the last 8760 hours. HbA1C: No results for input(s): HGBA1C in the last 72 hours. CBG: No results for input(s): GLUCAP in the last 168 hours. Lipid Profile: No results for input(s): CHOL, HDL, LDLCALC, TRIG, CHOLHDL, LDLDIRECT in the last 72 hours. Thyroid Function Tests: No results for input(s): TSH, T4TOTAL, FREET4, T3FREE, THYROIDAB in the last 72 hours. Anemia Panel: No results for input(s): VITAMINB12, FOLATE,  FERRITIN, TIBC, IRON, RETICCTPCT in the last 72 hours. Sepsis Labs: Recent Labs  Lab 07/27/21 1044 07/27/21 1354  LATICACIDVEN 2.5* 1.9    Recent Results (from the past 240 hour(s))  Culture, blood (Routine x 2)     Status: None (Preliminary result)   Collection Time: 07/27/21 10:44 AM   Specimen: BLOOD  Result Value Ref Range Status   Specimen Description BLOOD RIGHT [facility]  Final   Special Requests   Final    BOTTLES DRAWN AEROBIC AND ANAEROBIC Blood Culture adequate volume   Culture   Final    NO GROWTH < 24 HOURS Performed at [facility], [address], [place], [place] [address]    Report Status PENDING  Incomplete  Culture, blood (Routine x 2)     Status: None (Preliminary result)   Collection Time: 07/27/21 11:27 AM   Specimen: BLOOD  Result Value Ref Range Status   Specimen Description BLOOD BLOOD LEFT HAND  Final   Special Requests   Final    BOTTLES DRAWN AEROBIC AND ANAEROBIC Blood Culture results may not be optimal due to an inadequate volume of blood received in culture bottles   Culture   Final    NO GROWTH < 24 HOURS Performed at [facility], [address], [place], [place] [address]    Report Status PENDING  Incomplete  Resp Panel by RT-PCR (Flu A&B, Covid) Nasopharyngeal Swab     Status: None   Collection Time: 07/27/21 12:07 PM   Specimen: Nasopharyngeal Swab; Nasopharyngeal(NP) swabs in vial transport medium  Result Value Ref Range Status   SARS Coronavirus 2 by RT PCR NEGATIVE NEGATIVE Final    Comment: (NOTE) SARS-CoV-2 target nucleic acids are NOT DETECTED.  The SARS-CoV-2 RNA is generally detectable in upper respiratory specimens during the acute phase of infection. The lowest concentration of SARS-CoV-2 viral copies this assay can detect is 138 copies/mL. A negative result does not preclude SARS-Cov-2 infection and should not be used as the sole basis for treatment or other patient management decisions. A negative result may  occur with  improper specimen collection/handling, submission of specimen other than nasopharyngeal swab, presence of viral mutation(s) within the areas targeted by this assay, and inadequate number of viral copies(<138 copies/mL). A negative result must be combined with clinical observations, patient history, and epidemiological information. The expected result is Negative.  Fact Sheet for Patients:  EntrepreneurPulse.com.au  Fact Sheet for Healthcare Providers:  IncredibleEmployment.be  This test is no t yet approved or cleared by the [place] FDA and  has been authorized for detection and/or diagnosis of SARS-CoV-2 by FDA under an Emergency Use Authorization (EUA). This EUA will remain  in effect (meaning this test can be used) for  the duration of the COVID-19 declaration under Section 564(b)(1) of the Act, 21 U.S.C.section 360bbb-3(b)(1), unless the authorization is terminated  or revoked sooner.       Influenza A by PCR NEGATIVE NEGATIVE Final   Influenza B by PCR NEGATIVE NEGATIVE Final    Comment: (NOTE) The Xpert Xpress SARS-CoV-2/FLU/RSV plus assay is intended as an aid in the diagnosis of influenza from Nasopharyngeal swab specimens and should not be used as a sole basis for treatment. Nasal washings and aspirates are unacceptable for Xpert Xpress SARS-CoV-2/FLU/RSV testing.  Fact Sheet for Patients: EntrepreneurPulse.com.au  Fact Sheet for Healthcare Providers: IncredibleEmployment.be  This test is not yet approved or cleared by the [place] FDA and has been authorized for detection and/or diagnosis of SARS-CoV-2 by FDA under an Emergency Use Authorization (EUA). This EUA will remain in effect (meaning this test can be used) for the duration of the COVID-19 declaration under Section 564(b)(1) of the Act, 21 U.S.C. section 360bbb-3(b)(1), unless the authorization is terminated  or revoked.  Performed at [facility], [address] [place], [place] [address]          Radiology Studies: DG Chest 2 View  Result Date: 07/27/2021 CLINICAL DATA:  Pt here from his doctor's office with possible sepsis. Pt had a fever at home but took tylenol at 0800. Pt states that he feels weak and is nauseous. Pt also states bilateral flank painSuspected Sepsis EXAM: CHEST - 2 VIEW COMPARISON:  07/05/2021 FINDINGS: Normal mediastinum and cardiac silhouette. Normal pulmonary vasculature. No evidence of effusion, infiltrate, or pneumothorax. No acute bony abnormality. IMPRESSION: No acute cardiopulmonary process. Electronically Signed   By: Suzy Bouchard M.D.   On: 07/27/2021 11:27        Scheduled Meds:  apixaban  5 mg Oral BID   diltiazem  180 mg Oral BID   fluticasone furoate-vilanterol  1 puff Inhalation Daily   And   umeclidinium bromide  1 puff Inhalation Daily   hydrochlorothiazide  12.5 mg Oral Daily   lisinopril  20 mg Oral QHS   loratadine  10 mg Oral Daily   metoprolol succinate  25 mg Oral Daily   montelukast  10 mg Oral QHS   pantoprazole  40 mg Oral QPM   potassium chloride SA  20 mEq Oral Daily   potassium chloride  40 mEq Oral QHS   predniSONE  10 mg Oral Q breakfast   senna-docusate  1 tablet Oral BID   tamsulosin  0.4 mg Oral Daily   Continuous Infusions:  ceFEPime (MAXIPIME) IV 2 g (07/28/21 3383)     LOS: 1 day    Time spent: 32 mins     Wyvonnia Dusky, MD [facility] Pager [phone]  If 7PM-7AM, please contact night-coverage 07/28/2021, 8:56 AM

## 2021-07-28 NOTE — Consult Note (Signed)
Urology Consult  I have been asked to see the patient by Dr. Faythe Ghee, for evaluation and management of UTI/ stent/ post op patient.  Chief Complaint: UTI  History of Present Illness: [name] is a 65 y.o. 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 Tresa Moore.  He was admitted with sepsis of urinary source yesterday to [facility].  Urology consulted today to assist with further management.    He reports that over the past several weeks, he has had increasing malaise as well as some respiratory symptoms.  He has been treated for presumed bronchitis with several rounds of treatments including most recently doxycycline which he completed late last week, 10 mg of prednisone as well as inhalers.  Over the weekend, symptoms evolved to sweats, fevers, and increasing flank pain.  He has been having intermittent hematuria since the time of surgery.  In the emergency room, he was found to have floridly positive urinalysis.  Chest imaging was negative.  He was diagnosed with sepsis of urinary source as he met SIRS criteria with tachycardia, etc.  Please see H&P for further details.  Notably, he did have a Foley catheter postoperatively.  This was removed several weeks postop and Dr. Tresa Moore office and what sounds like a cystogram prior to this.  He was due to have his ureteral stent removed yesterday but due to worsening illness, presented to the emergency room at the advice of his PCP instead.    Other than blood and increasing left flank discomfort, no additional urinary symptoms.    He had new imaging yesterday upon admission other than chest x-ray.  He is currently on Cefepime.  Tmax 102.4 yesterday afternoon, no fevers since.  Other vitals also improving.  WBC stable.   Past Medical History:  Diagnosis Date   Acquired deafness, right    per pt completed deafness  right ear since age 56   Allergic rhinitis, seasonal    Anticoagulant long-term use    eliquis--- managed by cardiology   BPH associated with nocturia    urologist--- dr Tresa Moore   Chronic low back pain    DDD (degenerative disc disease), lumbosacral    Foraminal stenosis of lumbosacral region    GERD (gastroesophageal reflux disease)    Heart murmur    History of kidney stones    History of skin cancer    Hydronephrosis, left    Hyperlipidemia, mixed    Hypertension, essential    followed by pcp and cardiology   Hypokalemia    OA (osteoarthritis)    Paroxysmal atrial fibrillation ([place]) 01/28/2017   cardiologist--- dr Nehemiah Massed;   pt had ETT 03-16-2017 per cardio note normal, event monitor 02-09-2017 showed baseline SR some peroids  Afib with controlled rate with occ PAC/ PVC;  per pt had echo many yrs ago prior to AFib    Past Surgical History:  Procedure Laterality Date   ABDOMINAL EXPOSURE N/A 01/18/2021   Procedure: ABDOMINAL EXPOSURE;  Surgeon: [name], MD;  Location: [place];  Service: Vascular;  Laterality: N/A;   ANTERIOR LUMBAR FUSION N/A 01/18/2021   Procedure: Anterior Lumbar Interbody Fusion - Lumbar Five-Sarcal One;  Surgeon: Kary Kos, MD;  Location: [place];  Service: Neurosurgery;  Laterality: N/A;  Anterior Lumbar Interbody Fusion - Lumbar Five-Sacral One   CYSTOSCOPY W/ URETERAL STENT PLACEMENT Left 06/18/2021   Procedure:  CYSTOSCOPY WITH RETROGRADE PYELOGRAM/URETERAL STENT EXCHANGE;  Surgeon: Alexis Frock, MD;  Location: [facility];  Service: Urology;  Laterality: Left;   CYSTOSCOPY WITH RETROGRADE PYELOGRAM, URETEROSCOPY AND STENT PLACEMENT Left 04/21/2021   Procedure: CYSTOSCOPY WITH RETROGRADE PYELOGRAM, DIAGNOSTICURETEROSCOPY AND STENT PLACEMENT;  Surgeon: Alexis Frock, MD;  Location: [facility];  Service: Urology;  Laterality: Left;  1 HR   EXTRACORPOREAL SHOCK WAVE LITHOTRIPSY     approx 2002   LUMBAR [place] SURGERY  2018   REPAIR DURAL  / CSF LEAK  2018   post op lumbar diskectomy   REVISION TOTAL KNEE ARTHROPLASTY Left 2015   TOTAL KNEE ARTHROPLASTY Left 2012   TRANSFORAMINAL LUMBAR INTERBODY FUSION (TLIF) WITH PEDICLE SCREW FIXATION 1 LEVEL N/A 06/12/2019   Procedure: LUMBAR FOUR-FIVE TRANSFORAMINAL LUMBAR INTERBODY FUSION (TLIF) WITH PEDICLE SCREW FIXATION;  Surgeon: Kary Kos, MD;  Location: [place];  Service: Neurosurgery;  Laterality: N/A;   UMBILICAL HERNIA REPAIR  1992    Home Medications:  Current Meds  Medication Sig   albuterol (VENTOLIN HFA) 108 (90 Base) MCG/ACT inhaler Inhale 2 puffs into the lungs every 6 (six) hours as needed for wheezing or shortness of breath.   apixaban (ELIQUIS) 5 MG TABS tablet Take 5 mg by mouth 2 (two) times daily.   diltiazem (CARDIZEM CD) 180 MG 24 hr capsule Take 1 capsule by mouth twice daily   fexofenadine (ALLEGRA) 180 MG tablet Take 1 tablet (180 mg total) by mouth daily.   fluticasone (FLONASE) 50 MCG/ACT nasal spray Use 1 spray(s) in each nostril once daily   Fluticasone-Umeclidin-Vilant (TRELEGY ELLIPTA) 100-62.5-25 MCG/INH AEPB Inhale 1 puff into the lungs daily.   hydrochlorothiazide (HYDRODIURIL) 12.5 MG tablet Take 1 tablet (12.5 mg total) by mouth daily.   lisinopril (ZESTRIL) 20 MG tablet Take 1 tablet (20 mg total) by mouth every evening. (Patient taking differently: Take 20 mg by mouth at bedtime.)   metoprolol succinate (TOPROL-XL) 25 MG 24 hr tablet Take 1 tablet (25 mg total) by mouth daily.   montelukast (SINGULAIR) 10 MG tablet Take 1 tablet (10 mg total) by mouth at bedtime.   Olopatadine HCl 0.2 % SOLN Place 1 drop into both eyes 2 (two) times daily as needed (allergies.).   pantoprazole (PROTONIX) 40 MG tablet Take 1 tablet (40 mg total) by mouth daily. (Patient taking differently: Take 40 mg by mouth every evening.)   Phenazopyridine HCl (AZO URINARY PAIN PO) Take 1 capsule by mouth daily.   potassium chloride SA (KLOR-CON) 20 MEQ tablet Take 3 tablets (60  mEq total) by mouth daily. (Patient taking differently: Take 20-40 mEq by mouth 2 (two) times daily. Takes one tablet in am and two tabs in pm)   predniSONE (DELTASONE) 10 MG tablet Take 1 tablet (10 mg total) by mouth daily with breakfast.   tadalafil (CIALIS) 5 MG tablet Take 1-2 tablets (5-10 mg total) by mouth daily as needed for erectile dysfunction.   tamsulosin (FLOMAX) 0.4 MG CAPS capsule TAKE ONE (1) CAPSULE EACH DAY. (Patient taking differently: Take 0.4 mg by mouth daily.)    Allergies:  Allergies  Allergen Reactions   Meloxicam Palpitations    Family History  Problem Relation Age of Onset   Cancer Mother    Cancer Father    Cancer Brother    Hyperlipidemia Brother     Social History:  reports that he has never smoked. He quit smokeless tobacco use about 16 months ago.  His smokeless tobacco use included chew. He  reports current alcohol use. He reports that he does not use drugs.  ROS: A complete review of systems was performed.  All systems are negative except for pertinent findings as noted.  Physical Exam:  Vital signs in last 24 hours: Temp:  [97.4 F (36.3 C)-101.2 F (38.4 C)] 98.7 F (37.1 C) (10/05 0819) Pulse Rate:  [75-102] 75 (10/05 0819) Resp:  [18-22] 18 (10/05 0819) BP: (103-135)/(76-97) 131/86 (10/05 0819) SpO2:  [95 %-100 %] 95 % (10/05 0819) Constitutional:  Alert and oriented, No acute distress HEENT: [place] AT, moist mucus membranes.  Trachea midline, no masses Cardiovascular: Regular rate and rhythm, no clubbing, cyanosis, or edema. Respiratory: Normal respiratory effort, lungs clear bilaterally GI: Abdomen is soft, nontender, nondistended, no abdominal masses.  Wounds healing well. GU: No CVA tenderness Skin: No rashes, bruises or suspicious lesions Neurologic: Grossly intact, no focal deficits, moving all 4 extremities Psychiatric: Normal mood and affect   Laboratory Data:  Recent Labs    07/27/21 1043 07/28/21 0431  WBC 15.4* 14.1*   HGB 12.3* 11.5*  HCT 39.8 36.1*   Recent Labs    07/27/21 1043 07/28/21 0431  NA 133* 139  K 3.4* 4.2  CL 98 110  CO2 24 25  GLUCOSE 182* 262*  BUN 14 21  CREATININE 1.12 1.10  CALCIUM 8.9 8.5*   Recent Labs    07/27/21 1043 07/27/21 1354  INR 1.7* 2.2*   Urinalysis is frankly positive, positive nitrates, WBCs and RBCs, bacteria.  Urine culture pending.  Prelim blood culture negative.  Radiologic Imaging: DG Chest 2 View  Result Date: 07/27/2021 CLINICAL DATA:  Pt here from his doctor's office with possible sepsis. Pt had a fever at home but took tylenol at 0800. Pt states that he feels weak and is nauseous. Pt also states bilateral flank painSuspected Sepsis EXAM: CHEST - 2 VIEW COMPARISON:  07/05/2021 FINDINGS: Normal mediastinum and cardiac silhouette. Normal pulmonary vasculature. No evidence of effusion, infiltrate, or pneumothorax. No acute bony abnormality. IMPRESSION: No acute cardiopulmonary process. Electronically Signed   By: Suzy Bouchard M.D.   On: 07/27/2021 11:27    Impression/ Plan:  1.  Sepsis of urinary source-currently on broad-spectrum antibiotics which is appropriate in light of frankly positive urinalysis.  Urine culture is pending.  Unclear whether this culture was sent today or prior to receiving antibiotics yesterday.  Would recommend continued supportive care given that he is improving, broad-spectrum antibiotics which can be narrowed based on UA/urine culture.  Would treat for at least a 2-week course given this is a complicated UTI.  2.  Retained urinary stent/history of ureteral stricture-status post ureteral reimplant with Boari flap.  Surprisingly, no imaging was obtained yesterday to rule out other postop complication such as urine leak or abscess.  In light of improving vitals and fever curve, would hold off on further imaging for the time being.  KUB does demonstrate the stent is in good position.  If he does continue to spike fevers, would  recommend CT of the abdomen pelvis with contrast and consider repeat cystogram if there is any fluid collection.  I reached out to Dr. Tresa Moore to let him know about his patient's admission.  We will plan on rescheduling his stent removal as an outpatient at [facility].     07/28/2021, 4:46 PM  Hollice Espy,  MD

## 2021-07-29 DIAGNOSIS — B9689 Other specified bacterial agents as the cause of diseases classified elsewhere: Secondary | ICD-10-CM | POA: Diagnosis not present

## 2021-07-29 DIAGNOSIS — R7881 Bacteremia: Secondary | ICD-10-CM | POA: Diagnosis not present

## 2021-07-29 DIAGNOSIS — I10 Essential (primary) hypertension: Secondary | ICD-10-CM | POA: Diagnosis not present

## 2021-07-29 DIAGNOSIS — N390 Urinary tract infection, site not specified: Secondary | ICD-10-CM | POA: Diagnosis not present

## 2021-07-29 LAB — BLOOD CULTURE ID PANEL (REFLEXED) - BCID2

## 2021-07-29 LAB — BASIC METABOLIC PANEL
Anion gap: 5 (ref 5–15)
BUN: 32 mg/dL — ABNORMAL HIGH (ref 8–23)
CO2: 22 mmol/L (ref 22–32)
Calcium: 8.5 mg/dL — ABNORMAL LOW (ref 8.9–10.3)
Chloride: 111 mmol/L (ref 98–111)
Creatinine, Ser: 0.98 mg/dL (ref 0.61–1.24)
GFR, Estimated: >60 mL/min (ref >60)
Glucose, Bld: 182 mg/dL — ABNORMAL HIGH (ref 70–99)
Potassium: 3.7 mmol/L (ref 3.5–5.1)
Sodium: 138 mmol/L (ref 135–145)

## 2021-07-29 LAB — CBC
HCT: 32 % — ABNORMAL LOW (ref 39.0–52.0)
Hemoglobin: 10.1 g/dL — ABNORMAL LOW (ref 13.0–17.0)
MCH: 24.7 pg — ABNORMAL LOW (ref 26.0–34.0)
MCHC: 31.6 g/dL (ref 30.0–36.0)
MCV: 78.2 fL — ABNORMAL LOW (ref 80.0–100.0)
Platelets: 172 10*3/uL (ref 150–400)
RBC: 4.09 MIL/uL — ABNORMAL LOW (ref 4.22–5.81)
RDW: 17.1 % — ABNORMAL HIGH (ref 11.5–15.5)
WBC: 12.7 10*3/uL — ABNORMAL HIGH (ref 4.0–10.5)
nRBC: 0 % (ref 0.0–0.2)

## 2021-07-29 LAB — URINE CULTURE: Culture: <10000 — AB

## 2021-07-29 MED ORDER — METOPROLOL SUCCINATE ER 50 MG PO TB24
50.0000 mg | ORAL_TABLET | Freq: Every day | ORAL | Status: DC
  Administered 2021-07-30 – 2021-08-02 (×4): 50 mg via ORAL
  Filled 2021-07-29 (×4): qty 1

## 2021-07-29 MED ORDER — GUAIFENESIN-DM 100-10 MG/5ML PO SYRP
5.0000 mL | ORAL_SOLUTION | ORAL | Status: DC | PRN
  Administered 2021-07-29 – 2021-08-01 (×12): 5 mL via ORAL
  Filled 2021-07-29 (×12): qty 5

## 2021-07-29 NOTE — TOC Initial Note (Signed)
Transition of Care [facility]) - Initial/Assessment Note    Patient Details  Name: [name] MRN: [id number] Date of Birth: [birth year]  [facility] CM/SW Contact:    Candie Chroman, LCSW Phone Number: 07/29/2021, 12:18 PM  Clinical Narrative: Readmission prevention screen complete. CSW met with patient. Wife at bedside. CSW introduced role and explained that discharge planning would be discussed. PCP is Otilio Miu, MD. He drives himself to appointments. Pharmacy is [facility] in [place]. No issues obtaining medications. No home health or DME use prior to admission. No further concerns. CSW encouraged patient to contact CSW as needed. CSW will continue to follow patient for support and facilitate return home when stable.                 Expected Discharge Plan: Home/Self Care Barriers to Discharge: Continued Medical Work up   Patient Goals and CMS Choice        Expected Discharge Plan and Services Expected Discharge Plan: Home/Self Care     Post Acute Care Choice: NA Living arrangements for the past 2 months: Single Family Home                                      Prior Living Arrangements/Services Living arrangements for the past 2 months: Single Family Home Lives with:: Spouse Patient language and need for interpreter reviewed:: Yes Do you feel safe going back to the place where you live?: Yes      Need for Family Participation in Patient Care: Yes (Comment) Care giver support system in place?: Yes (comment)   Criminal Activity/Legal Involvement Pertinent to Current Situation/Hospitalization: No - Comment as needed  Activities of Daily Living Home Assistive Devices/Equipment: None ADL Screening (condition at time of admission) Patient's cognitive ability adequate to safely complete daily activities?: Yes Is the patient deaf or have difficulty hearing?: No Does the patient have difficulty seeing, even when wearing glasses/contacts?: No Does the  patient have difficulty concentrating, remembering, or making decisions?: No Patient able to express need for assistance with ADLs?: Yes Does the patient have difficulty dressing or bathing?: No Independently performs ADLs?: Yes (appropriate for developmental age) Does the patient have difficulty walking or climbing stairs?: No Weakness of Legs: None Weakness of Arms/Hands: None  Permission Sought/Granted                  Emotional Assessment Appearance:: Appears stated age Attitude/Demeanor/Rapport: Engaged, Gracious Affect (typically observed): Accepting, Appropriate, Calm, Pleasant Orientation: : Oriented to Self, Oriented to Place, Oriented to  Time, Oriented to Situation Alcohol / Substance Use: Not Applicable Psych Involvement: No (comment)  Admission diagnosis:  Sepsis secondary to UTI ([place]) [A41.9, N39.0] Urinary tract infection with hematuria, site unspecified [N39.0, R31.9] Sepsis without acute organ dysfunction, due to unspecified organism [facility]) [A41.9] Patient Active Problem List   Diagnosis Date Noted   SIRS (systemic inflammatory response syndrome) ([place]) 07/27/2021   Sepsis secondary to UTI ([place]) 07/27/2021   Ureteral stricture 06/18/2021   Sacroiliitis ([place]) 05/07/2021   Spinal stenosis of lumbosacral region 01/18/2021   Herniated nucleus pulposus, lumbar 03/16/2020   Body mass index (BMI) 33.0-33.9, adult 09/03/2019   Other intervertebral disc displacement, lumbar region 06/25/2019   HNP (herniated nucleus pulposus), lumbar 06/12/2019   UTI due to Klebsiella species 05/08/2019   Biceps tendonitis on right 07/27/2018   Spinal stenosis, 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  Lumbago with sciatica, unspecified side 11/14/2017   Primary osteoarthritis of right knee 10/12/2017   Taking multiple medications for chronic disease 07/30/2015   Bronchitis 07/01/2015   Reactive airway disease 07/01/2015   Allergic sinusitis 07/01/2015   Essential  (primary) hypertension 07/01/2015   Valvular heart disease 07/01/2015   Metatarsalgia of left foot 06/10/2015   [place] arthritis 12/15/2013   PCP:  Juline Patch, MD Pharmacy:   [facility] [address], [place] - [place] [address] Phone: [phone] Fax: [phone]     Social Determinants of Health (SDOH) Interventions    Readmission Risk Interventions Readmission Risk Prevention Plan 07/29/2021  Transportation Screening Complete  PCP or Specialist Appt within 3-5 Days Complete  Social Work Consult for [place] Planning/Counseling [place] Not Applicable  Medication Review Press photographer) Complete  Some recent data might be hidden

## 2021-07-29 NOTE — Progress Notes (Signed)
Mobility Specialist - Progress Note   07/29/21 1600  Mobility  Activity Ambulated in hall  Level of Assistance Independent  Assistive Device None  Distance Ambulated (ft) 360 ft  Mobility Ambulated independently in hallway  Mobility Response Tolerated well  Mobility performed by Mobility specialist  $Mobility charge 1 Mobility   Pre-mobility: 102 HR, 97% SpO2 Post-mobility: 102 HR, 98% SpO2   Pt ambulated in hallway independently on RA. A-fib HR did peak at 165 bpm, no s/s of distress. Winded after activity. RPE 4/10.    Kathee Delton Mobility Specialist 07/29/21, 4:18 PM

## 2021-07-29 NOTE — Consult Note (Signed)
NAME: [name]  DOB: [birth year]  MRN: [id number]  Date/Time: 07/29/2021 5:45 PM  REQUESTING PROVIDER: Dr.Willaims Subjective:  REASON FOR CONSULT: bacteremia ? [name] is a 65 y.o. with a history of HTN, PAF, B/l knee replacement, Lumbar surgery , complicated urological history, presents with hematuria of 1 week duration, fever and chills and not feeling well Pt [place] and saw blood in urine every day for the past week- When he returned he felt unwell and saw his PCP on Monday who asked him to go to the ED HE has a complicated surgical history . HE had  chronic lower back pain after previous TLIF and PLIF. So he   was taken for surgery on 01/18/21 and underwent Expiration fusion disconnection and removal of hardware L2-L5 with placement bilateral S1 cortical screws and replacement new rods from L2 down to S1  2.  Posterior lateral arthrodesis L5-S1  During that surgery the left ureter was mobilized to the midline. Significant inflammation and adherent left iliac vein was seen which was carefully released. Because of persistent back pain an MRI  was done for the lumbar spine on 03/11/21 and it showed a new left hydroureteronephrosis with left ureter taking and unusual course coarsing the left common iliac artery and vein to the midline anterior to the L5-S1 discectomy level HE was seen by urologist Dr. Tresa Moore and underwent cystoscopy on 04/21/21 and  it was noted the left ureter was medially deviated and middle ureter was not well visualized due to fixed tortuosity , and had  left ureteral stent placement. On 06/18/21 he had Robotic-assisted laparoscopic left ureteral reimplant with psoas hitch for high grade left distal ureteral stricture.Marland Kitchen He was also treated for recurrent sinusitis with 3 rounds of doxy. On 07/14/21 he saw his pcp and was diagnosed with Reactive airway disease and mild bronchiolitis and prescribed prednisone for 30 days left flank pain and cloudy  urine. Was febrile in the office with temp of 101.3 , HR 121, RR 24 and sats 93% and was sent to the ED by EMS  Vitals in the ED 116/97, Temp 98.6, RR 22, HR 82 WBC 15,4, HB 12.3 and plt 199 and cr 1.12 CXR no pneumonia Blood culture sent UA > 50 wbc and 21-50 RBC HE was started on cefepime He was seen by urologist Dr.Brandon and asked to continue antibiotics and manage medically the complicated UTI I ma seeing the patient as blood culture positive for enterobacter  Past Medical History:  Diagnosis Date   Acquired deafness, right    per pt completed deafness right ear since age 57   Allergic rhinitis, seasonal    Anticoagulant long-term use    eliquis--- managed by cardiology   BPH associated with nocturia    urologist--- dr Tresa Moore   Chronic low back pain    DDD (degenerative disc disease), lumbosacral    Foraminal stenosis of lumbosacral region    GERD (gastroesophageal reflux disease)    Heart murmur    History of kidney stones    History of skin cancer    Hydronephrosis, left    Hyperlipidemia, mixed    Hypertension, essential    followed by pcp and cardiology   Hypokalemia    OA (osteoarthritis)    Paroxysmal atrial fibrillation ([place]) 01/28/2017   cardiologist--- dr Nehemiah Massed;   pt had ETT 03-16-2017 per cardio note normal, event monitor 02-09-2017 showed baseline SR some peroids  Afib with controlled rate with occ  PAC/ PVC;  per pt had echo many yrs ago prior to AFib    Past Surgical History:  Procedure Laterality Date   ABDOMINAL EXPOSURE N/A 01/18/2021   Procedure: ABDOMINAL EXPOSURE;  Surgeon: [name], MD;  Location: [place];  Service: Vascular;  Laterality: N/A;   ANTERIOR LUMBAR FUSION N/A 01/18/2021   Procedure: Anterior Lumbar Interbody Fusion - Lumbar Five-Sarcal One;  Surgeon: Kary Kos, MD;  Location: [facility];  Service: Neurosurgery;  Laterality: N/A;  Anterior Lumbar Interbody Fusion - Lumbar Five-Sacral One   CYSTOSCOPY W/ URETERAL STENT PLACEMENT  Left 06/18/2021   Procedure: CYSTOSCOPY WITH RETROGRADE PYELOGRAM/URETERAL STENT EXCHANGE;  Surgeon: Alexis Frock, MD;  Location: [facility];  Service: Urology;  Laterality: Left;   CYSTOSCOPY WITH RETROGRADE PYELOGRAM, URETEROSCOPY AND STENT PLACEMENT Left 04/21/2021   Procedure: CYSTOSCOPY WITH RETROGRADE PYELOGRAM, DIAGNOSTICURETEROSCOPY AND STENT PLACEMENT;  Surgeon: Alexis Frock, MD;  [facility];  Service: Urology;  Laterality: Left;  1 HR   EXTRACORPOREAL SHOCK WAVE LITHOTRIPSY     approx 2002   LUMBAR [place] SURGERY  2018   REPAIR DURAL / CSF LEAK  2018   post op lumbar diskectomy   REVISION TOTAL KNEE ARTHROPLASTY Left 2015   TOTAL KNEE ARTHROPLASTY Left 2012   TRANSFORAMINAL LUMBAR INTERBODY FUSION (TLIF) WITH PEDICLE SCREW FIXATION 1 LEVEL N/A 06/12/2019   Procedure: LUMBAR FOUR-FIVE TRANSFORAMINAL LUMBAR INTERBODY FUSION (TLIF) WITH PEDICLE SCREW FIXATION;  Surgeon: Kary Kos, MD;  Location: [place];  Service: Neurosurgery;  Laterality: N/A;   UMBILICAL HERNIA REPAIR  1992    Social History   Socioeconomic History   Marital status: Married    Spouse name: [name]   Number of children: Not on file   Years of education: Not on file   Highest education level: Not on file  Occupational History   Not on file  Tobacco Use   Smoking status: Never   Smokeless tobacco: Former    Types: Sarina Ser    Quit date: 03/2020  Vaping Use   Vaping Use: Never used  Substance and Sexual Activity   Alcohol use: Yes    Comment: ocassional   Drug use: Never   Sexual activity: Yes  Other Topics Concern   Not on file  Social History Narrative   Not on file   Social Determinants of Health   Financial Resource Strain: Low Risk    Difficulty of Paying Living Expenses: Not hard at all  Food Insecurity: No Food Insecurity   Worried About Charity fundraiser in the Last Year: Never true   [place] in the Last Year: Never true  Transportation Needs: No  Transportation Needs   Lack of Transportation (Medical): No   Lack of Transportation (Non-Medical): No  Physical Activity: Inactive   Days of Exercise per Week: 0 days   Minutes of Exercise per Session: 0 min  Stress: No Stress Concern Present   Feeling of Stress : Not at all  Social Connections: Socially Integrated   Frequency of Communication with Friends and Family: More than three times a week   Frequency of Social Gatherings with Friends and Family: More than three times a week   Attends Religious Services: More than 4 times per year   Active Member of [employer] or Organizations: Yes   Attends Music therapist: More than 4 times per year   Marital Status: Married  Human resources officer Violence: Not At Risk   Fear of Current or  Ex-Partner: No   Emotionally Abused: No   Physically Abused: No   Sexually Abused: No    Family History  Problem Relation Age of Onset   Cancer Mother    Cancer Father    Cancer Brother    Hyperlipidemia Brother    Allergies  Allergen Reactions   Meloxicam Palpitations   I? Current Facility-Administered Medications  Medication Dose Route Frequency Provider Last Rate Last Admin   acetaminophen (TYLENOL) tablet 650 mg  650 mg Oral Q6H PRN Norins, Heinz Knuckles, MD   650 mg at 07/28/21 1353   Or   acetaminophen (TYLENOL) suppository 650 mg  650 mg Rectal Q6H PRN Norins, Heinz Knuckles, MD       albuterol (PROVENTIL) (2.5 MG/3ML) 0.083% nebulizer solution 3 mL  3 mL Inhalation Q6H PRN Norins, Heinz Knuckles, MD       apixaban (ELIQUIS) tablet 5 mg  5 mg Oral BID Norins, Heinz Knuckles, MD   5 mg at 07/29/21 0917   ceFEPIme (MAXIPIME) 2 g in sodium chloride 0.9 % 100 mL IVPB  2 g Intravenous Q8H Nazari, Walid A, RPH 200 mL/hr at 07/29/21 1439 2 g at 07/29/21 1439   diltiazem (CARDIZEM CD) 24 hr capsule 180 mg  180 mg Oral BID Norins, Heinz Knuckles, MD   180 mg at 07/29/21 0918   fluticasone furoate-vilanterol (BREO ELLIPTA) 100-25 MCG/INH 1 puff  1 puff Inhalation Daily  Norins, Heinz Knuckles, MD   1 puff at 07/29/21 4008   And   umeclidinium bromide (INCRUSE ELLIPTA) 62.5 MCG/INH 1 puff  1 puff Inhalation Daily Norins, Heinz Knuckles, MD   1 puff at 07/29/21 0922   guaiFENesin-dextromethorphan (ROBITUSSIN DM) 100-10 MG/5ML syrup 5 mL  5 mL Oral Q4H PRN Wyvonnia Dusky, MD   5 mL at 07/29/21 1306   hydrochlorothiazide (HYDRODIURIL) tablet 12.5 mg  12.5 mg Oral Daily Norins, Heinz Knuckles, MD   12.5 mg at 07/29/21 0917   HYDROcodone-acetaminophen (NORCO/VICODIN) 5-325 MG per tablet 1 tablet  1 tablet Oral Q6H PRN Athena Masse, MD   1 tablet at 07/28/21 1722   lisinopril (ZESTRIL) tablet 20 mg  20 mg Oral QHS Norins, Heinz Knuckles, MD   20 mg at 07/28/21 2015   loratadine (CLARITIN) tablet 10 mg  10 mg Oral Daily Neena Rhymes, MD   10 mg at 07/29/21 0918   [START ON 07/30/2021] metoprolol succinate (TOPROL-XL) 24 hr tablet 50 mg  50 mg Oral Daily Wyvonnia Dusky, MD       montelukast (SINGULAIR) tablet 10 mg  10 mg Oral QHS Norins, Heinz Knuckles, MD   10 mg at 07/28/21 2017   olopatadine (PATANOL) 0.1 % ophthalmic solution 1 drop  1 drop Both Eyes BID PRN Norins, Heinz Knuckles, MD       pantoprazole (PROTONIX) EC tablet 40 mg  40 mg Oral QPM Norins, Heinz Knuckles, MD   40 mg at 07/28/21 1719   predniSONE (DELTASONE) tablet 10 mg  10 mg Oral Q breakfast Norins, Heinz Knuckles, MD   10 mg at 07/29/21 6761   senna-docusate (Senokot-S) tablet 1 tablet  1 tablet Oral BID Neena Rhymes, MD   1 tablet at 07/29/21 9509   tamsulosin (FLOMAX) capsule 0.4 mg  0.4 mg Oral Daily Neena Rhymes, MD   0.4 mg at 07/29/21 3267   traZODone (DESYREL) tablet 25 mg  25 mg Oral QHS PRN Norins, Heinz Knuckles, MD  Abtx:  Anti-infectives (From admission, onward)    Start     Dose/Rate Route Frequency Ordered Stop   07/28/21 1200  ceFEPIme (MAXIPIME) 2 g in sodium chloride 0.9 % 100 mL IVPB  Status:  Discontinued        2 g 200 mL/hr over 30 Minutes Intravenous Every 24 hours 07/27/21 1233  07/27/21 1953   07/27/21 2200  ceFEPIme (MAXIPIME) 2 g in sodium chloride 0.9 % 100 mL IVPB        2 g 200 mL/hr over 30 Minutes Intravenous Every 8 hours 07/27/21 1953     07/27/21 1145  ceFEPIme (MAXIPIME) 2 g in sodium chloride 0.9 % 100 mL IVPB        2 g 200 mL/hr over 30 Minutes Intravenous  Once 07/27/21 1144 07/27/21 1240       REVIEW OF SYSTEMS:  Const:  fever,  chills, negative weight loss Eyes: negative diplopia or visual changes, negative eye pain ENT: negative coryza, negative sore throat Resp:  cough,, dyspnea Cards: negative for chest pain, palpitations, lower extremity edema GU:  hematuria, left flank pain GI: Negative for abdominal pain, diarrhea, bleeding, constipation Skin: negative for rash and pruritus Heme: negative for easy bruising and gum/nose bleeding MS: , back pain and muscle weakness Neurolo:negative for headaches, dizziness, vertigo, memory problems  Psych: negative for feelings of anxiety, depression  Endocrine: negative for thyroid, diabetes Allergy/Immunology- as above: Objective:  VITALS:  BP 134/79   Pulse (!) 52   Temp 99 F (37.2 C) (Oral)   Resp 15   Ht 6\' 1"  (1.854 m)   Wt 108.9 kg   SpO2 97%   BMI 31.66 kg/m  PHYSICAL EXAM:  General: Alert, cooperative, no distress, appears stated age.  Head: Normocephalic, without obvious abnormality, atraumatic. Eyes: Conjunctivae clear, anicteric sclerae. Pupils are equal ENT Nares normal. No drainage or sinus tenderness. Lips, mucosa, and tongue normal. No Thrush Neck: Supple, symmetrical, no adenopathy, thyroid: non tender no carotid bruit and no JVD. Back: No CVA tenderness. Lungs: Clear to auscultation bilaterally. No Wheezing or Rhonchi. No rales. Heart: irregular Abdomen: Soft, non-tender,not distended. Bowel sounds normal. No masses Extremities: atraumatic, no cyanosis. No edema. No clubbing Skin: sun damage  Lymph: Cervical, supraclavicular normal. Neurologic: Grossly  non-focal Pertinent Labs Lab Results CBC    Component Value Date/Time   WBC 12.7 (H) 07/29/2021 0304   RBC 4.09 (L) 07/29/2021 0304   HGB 10.1 (L) 07/29/2021 0304   HGB 15.2 12/03/2018 1026   HCT 32.0 (L) 07/29/2021 0304   HCT 43.9 12/03/2018 1026   PLT 172 07/29/2021 0304   PLT 165 12/03/2018 1026   MCV 78.2 (L) 07/29/2021 0304   MCV 83 12/03/2018 1026   MCH 24.7 (L) 07/29/2021 0304   MCHC 31.6 07/29/2021 0304   RDW 17.1 (H) 07/29/2021 0304   RDW 14.0 12/03/2018 1026   LYMPHSABS 0.8 07/27/2021 1043   MONOABS 0.6 07/27/2021 1043   EOSABS 0.0 07/27/2021 1043   BASOSABS 0.0 07/27/2021 1043    CMP Latest Ref Rng & Units 07/29/2021 07/28/2021 07/27/2021  Glucose 70 - 99 mg/dL 182(H) 262(H) 182(H)  BUN 8 - 23 mg/dL 32(H) 21 14  Creatinine 0.61 - 1.24 mg/dL 0.98 1.10 1.12  Sodium 135 - 145 mmol/L 138 139 133(L)  Potassium 3.5 - 5.1 mmol/L 3.7 4.2 3.4(L)  Chloride 98 - 111 mmol/L 111 110 98  CO2 22 - 32 mmol/L 22 25 24   Calcium 8.9 - 10.3 mg/dL 8.5(L) 8.5(L)  8.9  Total Protein 6.5 - 8.1 g/dL - - 7.5  Total Bilirubin 0.3 - 1.2 mg/dL - - 1.1  Alkaline Phos 38 - 126 U/L - - 73  AST 15 - 41 U/L - - 23  ALT 0 - 44 U/L - - 24      Microbiology: Recent Results (from the past 240 hour(s))  Culture, blood (Routine x 2)     Status: None (Preliminary result)   Collection Time: 07/27/21 10:44 AM   Specimen: BLOOD  Result Value Ref Range Status   Specimen Description BLOOD RIGHT [facility]  Final   Special Requests   Final    BOTTLES DRAWN AEROBIC AND ANAEROBIC Blood Culture adequate volume   Culture   Final    NO GROWTH 2 DAYS Performed at [facility], [address], [place], [place] [address]    Report Status PENDING  Incomplete  Culture, blood (Routine x 2)     Status: None (Preliminary result)   Collection Time: 07/27/21 11:27 AM   Specimen: BLOOD  Result Value Ref Range Status   Specimen Description   Final    BLOOD BLOOD LEFT HAND Performed at [facility],  [address], [place], [place] [address]    Special Requests   Final    BOTTLES DRAWN AEROBIC AND ANAEROBIC Blood Culture results may not be optimal due to an inadequate volume of blood received in culture bottles Performed at [facility], [address], [place], [place] [address]    Culture  Setup Time   Final    GRAM NEGATIVE RODS BOTTLES DRAWN AEROBIC ONLY CRITICAL RESULT CALLED TO, READ BACK BY AND VERIFIED WITH: NATHAN BLUE@0621  07/29/21 RH Performed at [facility], [place] [address] [place], [place] [address]    Culture GRAM NEGATIVE RODS  Final   Report Status PENDING  Incomplete  Blood Culture ID Panel (Reflexed)     Status: Abnormal   Collection Time: 07/27/21 11:27 AM  Result Value Ref Range Status   Enterococcus faecalis NOT DETECTED NOT DETECTED Final   Enterococcus Faecium NOT DETECTED NOT DETECTED Final   Listeria monocytogenes NOT DETECTED NOT DETECTED Final   Staphylococcus species NOT DETECTED NOT DETECTED Final   Staphylococcus aureus (BCID) NOT DETECTED NOT DETECTED Final   Staphylococcus epidermidis NOT DETECTED NOT DETECTED Final   Staphylococcus lugdunensis NOT DETECTED NOT DETECTED Final   Streptococcus species NOT DETECTED NOT DETECTED Final   Streptococcus agalactiae NOT DETECTED NOT DETECTED Final   Streptococcus pneumoniae NOT DETECTED NOT DETECTED Final   Streptococcus pyogenes NOT DETECTED NOT DETECTED Final   A.calcoaceticus-baumannii NOT DETECTED NOT DETECTED Final   Bacteroides fragilis NOT DETECTED NOT DETECTED Final   Enterobacterales DETECTED (A) NOT DETECTED Final    Comment: Enterobacterales represent a large order of gram negative bacteria, not a single organism. CRITICAL RESULT CALLED TO, READ BACK BY AND VERIFIED WITH: NATHAN BLUE@0621  07/29/21 RH    Enterobacter cloacae complex DETECTED (A) NOT DETECTED Final    Comment: CRITICAL RESULT CALLED TO, READ BACK BY AND VERIFIED WITH: NATHAN BLUE@0621  07/29/21 RH     Escherichia coli NOT DETECTED NOT DETECTED Final   Klebsiella aerogenes NOT DETECTED NOT DETECTED Final   Klebsiella oxytoca NOT DETECTED NOT DETECTED Final   Klebsiella pneumoniae NOT DETECTED NOT DETECTED Final   Proteus species NOT DETECTED NOT DETECTED Final   Salmonella species NOT DETECTED NOT DETECTED Final   Serratia marcescens NOT DETECTED NOT DETECTED Final   Haemophilus influenzae  NOT DETECTED NOT DETECTED Final   Neisseria meningitidis NOT DETECTED NOT DETECTED Final   Pseudomonas aeruginosa NOT DETECTED NOT DETECTED Final   Stenotrophomonas maltophilia NOT DETECTED NOT DETECTED Final   Candida albicans NOT DETECTED NOT DETECTED Final   Candida auris NOT DETECTED NOT DETECTED Final   Candida glabrata NOT DETECTED NOT DETECTED Final   Candida krusei NOT DETECTED NOT DETECTED Final   Candida parapsilosis NOT DETECTED NOT DETECTED Final   Candida tropicalis NOT DETECTED NOT DETECTED Final   Cryptococcus neoformans/gattii NOT DETECTED NOT DETECTED Final   CTX-M ESBL NOT DETECTED NOT DETECTED Final   Carbapenem resistance IMP NOT DETECTED NOT DETECTED Final   Carbapenem resistance KPC NOT DETECTED NOT DETECTED Final   Carbapenem resistance NDM NOT DETECTED NOT DETECTED Final   Carbapenem resist OXA 48 LIKE NOT DETECTED NOT DETECTED Final   Carbapenem resistance VIM NOT DETECTED NOT DETECTED Final    Comment: Performed at [facility], [address] [place], [place] [address]  Resp Panel by RT-PCR (Flu A&B, Covid) Nasopharyngeal Swab     Status: None   Collection Time: 07/27/21 12:07 PM   Specimen: Nasopharyngeal Swab; Nasopharyngeal(NP) swabs in vial transport medium  Result Value Ref Range Status   SARS Coronavirus 2 by RT PCR NEGATIVE NEGATIVE Final    Comment: (NOTE) SARS-CoV-2 target nucleic acids are NOT DETECTED.  The SARS-CoV-2 RNA is generally detectable in upper respiratory specimens during the acute phase of infection. The lowest concentration of  SARS-CoV-2 viral copies this assay can detect is 138 copies/mL. A negative result does not preclude SARS-Cov-2 infection and should not be used as the sole basis for treatment or other patient management decisions. A negative result may occur with  improper specimen collection/handling, submission of specimen other than nasopharyngeal swab, presence of viral mutation(s) within the areas targeted by this assay, and inadequate number of viral copies(<138 copies/mL). A negative result must be combined with clinical observations, patient history, and epidemiological information. The expected result is Negative.  Fact Sheet for Patients:  EntrepreneurPulse.com.au  Fact Sheet for Healthcare Providers:  IncredibleEmployment.be  This test is no t yet approved or cleared by the [place] FDA and  has been authorized for detection and/or diagnosis of SARS-CoV-2 by FDA under an Emergency Use Authorization (EUA). This EUA will remain  in effect (meaning this test can be used) for the duration of the COVID-19 declaration under Section 564(b)(1) of the Act, 21 U.S.C.section 360bbb-3(b)(1), unless the authorization is terminated  or revoked sooner.       Influenza A by PCR NEGATIVE NEGATIVE Final   Influenza B by PCR NEGATIVE NEGATIVE Final    Comment: (NOTE) The Xpert Xpress SARS-CoV-2/FLU/RSV plus assay is intended as an aid in the diagnosis of influenza from Nasopharyngeal swab specimens and should not be used as a sole basis for treatment. Nasal washings and aspirates are unacceptable for Xpert Xpress SARS-CoV-2/FLU/RSV testing.  Fact Sheet for Patients: EntrepreneurPulse.com.au  Fact Sheet for Healthcare Providers: IncredibleEmployment.be  This test is not yet approved or cleared by the [place] FDA and has been authorized for detection and/or diagnosis of SARS-CoV-2 by FDA under an Emergency Use  Authorization (EUA). This EUA will remain in effect (meaning this test can be used) for the duration of the COVID-19 declaration under Section 564(b)(1) of the Act, 21 U.S.C. section 360bbb-3(b)(1), unless the authorization is terminated or revoked.  Performed at [facility], [address], [place], [place] [address]   Urine Culture  Status: Abnormal   Collection Time: 07/28/21 11:17 AM   Specimen: Urine, Clean Catch  Result Value Ref Range Status   Specimen Description   Final    URINE, CLEAN CATCH Performed at [facility], [address] [place], [place] [address]    Special Requests   Final    NONE Performed at [facility], [address] [place], [place] [address]    Culture (A)  Final    <10,000 COLONIES/mL INSIGNIFICANT GROWTH Performed at [facility] [address], [place], [place] [address]    Report Status 07/29/2021 FINAL  Final    IMAGING RESULTS:   I have personally reviewed the films ? Impression/Recommendation  Enterobacter bacteremia Complicated UTI- has a left ureteral stent for left ureteral stricture He is currently on cefepime- await susceptibility to see whether PO quinolone ( make sure no drug interactions)  is an option- HE will need 2 weeks of antibiotic totatl and stent will have to be removed while on antibiotic Recommend Ct imaging of the renal system to see the status of the hydronephrosis    left distal ureter stricture secondary to complicated lumbar fusion surgery and he developed left hydronephrosis  He underwent robotic left ureter reimplant surgery with psoas hitch aug 2022  Paroxysmal Afib- on eliquis, diltiazem and metoprolol  Chronic cough much improved with prednisone- being treated as RAD  Preliminary HIV test was reactive and it is highly likely a false positive test- Await confirmatory test  Discussed the management with the patient Dr.Snider is covering for the next three days by  phone- call if needed   ___________________________________________________

## 2021-07-29 NOTE — Consult Note (Signed)
PHARMACY - PHYSICIAN COMMUNICATION CRITICAL VALUE ALERT - BLOOD CULTURE IDENTIFICATION (BCID)  [name] is an 65 y.o. male who presented to [facility] on 07/27/2021 with a chief complaint of fever and tachycardia/bilateral flank pain   Assessment:  1 out of 4 bottles growing GNR and BCID + for enterobacter cloacae complex, with no ESBL resistance detected. Source most likely urine.    Name of physician (or Provider) Contacted: Williams  Current antibiotics: cefepime 2 g q8H  Changes to prescribed antibiotics recommended:  Patient is on recommended antibiotics - No changes needed  Results for orders placed or performed during the hospital encounter of 07/27/21  Blood Culture ID Panel (Reflexed) (Collected: 07/27/2021 11:27 AM)  Result Value Ref Range   Enterococcus faecalis NOT DETECTED NOT DETECTED   Enterococcus Faecium NOT DETECTED NOT DETECTED   Listeria monocytogenes NOT DETECTED NOT DETECTED   Staphylococcus species NOT DETECTED NOT DETECTED   Staphylococcus aureus (BCID) NOT DETECTED NOT DETECTED   Staphylococcus epidermidis NOT DETECTED NOT DETECTED   Staphylococcus lugdunensis NOT DETECTED NOT DETECTED   Streptococcus species NOT DETECTED NOT DETECTED   Streptococcus agalactiae NOT DETECTED NOT DETECTED   Streptococcus pneumoniae NOT DETECTED NOT DETECTED   Streptococcus pyogenes NOT DETECTED NOT DETECTED   A.calcoaceticus-baumannii NOT DETECTED NOT DETECTED   Bacteroides fragilis NOT DETECTED NOT DETECTED   Enterobacterales DETECTED (A) NOT DETECTED   Enterobacter cloacae complex DETECTED (A) NOT DETECTED   Escherichia coli NOT DETECTED NOT DETECTED   Klebsiella aerogenes NOT DETECTED NOT DETECTED   Klebsiella oxytoca NOT DETECTED NOT DETECTED   Klebsiella pneumoniae NOT DETECTED NOT DETECTED   Proteus species NOT DETECTED NOT DETECTED   Salmonella species NOT DETECTED NOT DETECTED   Serratia marcescens NOT DETECTED NOT DETECTED   Haemophilus influenzae NOT  DETECTED NOT DETECTED   Neisseria meningitidis NOT DETECTED NOT DETECTED   Pseudomonas aeruginosa NOT DETECTED NOT DETECTED   Stenotrophomonas maltophilia NOT DETECTED NOT DETECTED   Candida albicans NOT DETECTED NOT DETECTED   Candida auris NOT DETECTED NOT DETECTED   Candida glabrata NOT DETECTED NOT DETECTED   Candida krusei NOT DETECTED NOT DETECTED   Candida parapsilosis NOT DETECTED NOT DETECTED   Candida tropicalis NOT DETECTED NOT DETECTED   Cryptococcus neoformans/gattii NOT DETECTED NOT DETECTED   CTX-M ESBL NOT DETECTED NOT DETECTED   Carbapenem resistance IMP NOT DETECTED NOT DETECTED   Carbapenem resistance KPC NOT DETECTED NOT DETECTED   Carbapenem resistance NDM NOT DETECTED NOT DETECTED   Carbapenem resist OXA 48 LIKE NOT DETECTED NOT DETECTED   Carbapenem resistance VIM NOT DETECTED NOT DETECTED    Oswald Hillock 07/29/2021  7:50 AM

## 2021-07-29 NOTE — Progress Notes (Addendum)
PROGRESS NOTE    [name]  EGB:[id number] DOB: 06-23-1956 DOA: 07/27/2021 PCP: Juline Patch, MD   Assessment & Plan:   Active Problems:   Reactive airway disease   Essential (primary) hypertension   Ureteral stricture   Sepsis secondary to UTI ([place])   Sepsis: met criteria w/ lactic acidosis, leukocytosis, tachycardia, fever & likely UTI. Has ureteral stent in place. Continue on IV cefepime. Urology recs apprec       UTI: urine cx shows insignificant growth. Continue on IV cefepime. Has ureteral stent in place and will be removed as an outpatient as per uro. Uro recs apprec   Bacteremia: blood cxs growing enterobacter, sens pending. Continue on IV cefepime  Leukocytosis: secondary to above infection. Trending down    HTN: continue on CCB    Reactive airway disease: continue on bronchodilators. Encourage incentive spirometry & flutter valve    PAF: continue on home dose of eliquis, diltiazem   Obesity: BMI 31.6. Complicates overall care & prognosis   DVT prophylaxis: eliquis  Code Status:  full  Family Communication: Disposition Plan: likely d/c back home   Level of care: Med-Surg  Status is: Inpatient  Remains inpatient appropriate because:IV treatments appropriate due to intensity of illness or inability to take PO and Inpatient level of care appropriate due to severity of illness  Dispo: The patient is from: Home              Anticipated d/c is to: Home              Patient currently is not medically stable to d/c.   Difficult to place patient : unclear        Consultants:  Urology: Dr. Erlene Quan   Procedures:  Antimicrobials: cefepime    Subjective: Pt c/o chest congestion   Objective: Vitals:   07/28/21 0452 07/28/21 0819 07/28/21 1950 07/29/21 0308  BP: 103/76 131/86 123/79 (!) 123/92  Pulse: 97 75 88 79  Resp: _0 Temp: (!) 97.4 F (36.3 C) 98.7 F (37.1 C) 98.2 F (36.8 C) 97.7 F (36.5 C)  TempSrc: Oral Oral Oral  Oral  SpO2: 97% 95% 96% 95%  Weight:      Height:        Intake/Output Summary (Last 24 hours) at 07/29/2021 0801 Last data filed at 07/29/2021 0442 Gross per 24 hour  Intake 1720.3 ml  Output --  Net 1720.3 ml   Filed Weights   07/27/21 1033  Weight: 108.9 kg    Examination:  General exam: Appears comfortable  Respiratory system: course breath sounds  Cardiovascular system: S1/S2+. No rubs or clicks  Gastrointestinal system: Abd is soft, NT, obese & normal bowel sounds  Central nervous system: Alert and oriented. Moves all extremities  Psychiatry: judgement and insight appears normal. Appropriate mood and affect     Data Reviewed: I have personally reviewed following labs and imaging studies  CBC: Recent Labs  Lab 07/27/21 1043 07/28/21 0431 07/29/21 0304  WBC 15.4* 14.1* 12.7*  NEUTROABS 13.7*  --   --   HGB 12.3* 11.5* 10.1*  HCT 39.8 36.1* 32.0*  MCV 78.7* 79.5* 78.2*  PLT 199 165 371   Basic Metabolic Panel: Recent Labs  Lab 07/27/21 1043 07/28/21 0431 07/29/21 0304  NA 133* 139 138  K 3.4* 4.2 3.7  CL 98 110 111  CO2 _1 GLUCOSE 182* 262* 182*  BUN 14 21 32*  CREATININE 1.12 1.10 0.98  CALCIUM  8.9 8.5* 8.5*   GFR: Estimated Creatinine Clearance: 94.7 mL/min (by C-G formula based on SCr of 0.98 mg/dL). Liver Function Tests: Recent Labs  Lab 07/27/21 1043  AST 23  ALT 24  ALKPHOS 73  BILITOT 1.1  PROT 7.5  ALBUMIN 3.5   No results for input(s): LIPASE, AMYLASE in the last 168 hours. No results for input(s): AMMONIA in the last 168 hours. Coagulation Profile: Recent Labs  Lab 07/27/21 1043 07/27/21 1354  INR 1.7* 2.2*   Cardiac Enzymes: No results for input(s): CKTOTAL, CKMB, CKMBINDEX, TROPONINI in the last 168 hours. BNP (last 3 results) No results for input(s): PROBNP in the last 8760 hours. HbA1C: No results for input(s): HGBA1C in the last 72 hours. CBG: No results for input(s): GLUCAP in the last 168 hours. Lipid  Profile: No results for input(s): CHOL, HDL, LDLCALC, TRIG, CHOLHDL, LDLDIRECT in the last 72 hours. Thyroid Function Tests: No results for input(s): TSH, T4TOTAL, FREET4, T3FREE, THYROIDAB in the last 72 hours. Anemia Panel: No results for input(s): VITAMINB12, FOLATE, FERRITIN, TIBC, IRON, RETICCTPCT in the last 72 hours. Sepsis Labs: Recent Labs  Lab 07/27/21 1044 07/27/21 1354  LATICACIDVEN 2.5* 1.9    Recent Results (from the past 240 hour(s))  Culture, blood (Routine x 2)     Status: None (Preliminary result)   Collection Time: 07/27/21 10:44 AM   Specimen: BLOOD  Result Value Ref Range Status   Specimen Description BLOOD RIGHT [facility]  Final   Special Requests   Final    BOTTLES DRAWN AEROBIC AND ANAEROBIC Blood Culture adequate volume   Culture   Final    NO GROWTH 2 DAYS Performed at [facility], [address], [place], [place] [address]    Report Status PENDING  Incomplete  Culture, blood (Routine x 2)     Status: None (Preliminary result)   Collection Time: 07/27/21 11:27 AM   Specimen: BLOOD  Result Value Ref Range Status   Specimen Description BLOOD BLOOD LEFT HAND  Final   Special Requests   Final    BOTTLES DRAWN AEROBIC AND ANAEROBIC Blood Culture results may not be optimal due to an inadequate volume of blood received in culture bottles   Culture  Setup Time   Final    GRAM NEGATIVE RODS BOTTLES DRAWN AEROBIC ONLY Organism ID to follow CRITICAL RESULT CALLED TO, READ BACK BY AND VERIFIED WITH: NATHAN [id number]  07/29/21 RH Performed at [facility], [address] [place], [place] [address]    Culture GRAM NEGATIVE RODS  Final   Report Status PENDING  Incomplete  Blood Culture ID Panel (Reflexed)     Status: Abnormal   Collection Time: 07/27/21 11:27 AM  Result Value Ref Range Status   Enterococcus faecalis NOT DETECTED NOT DETECTED Final   Enterococcus Faecium NOT DETECTED NOT DETECTED Final   Listeria monocytogenes NOT DETECTED NOT  DETECTED Final   Staphylococcus species NOT DETECTED NOT DETECTED Final   Staphylococcus aureus (BCID) NOT DETECTED NOT DETECTED Final   Staphylococcus epidermidis NOT DETECTED NOT DETECTED Final   Staphylococcus lugdunensis NOT DETECTED NOT DETECTED Final   Streptococcus species NOT DETECTED NOT DETECTED Final   Streptococcus agalactiae NOT DETECTED NOT DETECTED Final   Streptococcus pneumoniae NOT DETECTED NOT DETECTED Final   Streptococcus pyogenes NOT DETECTED NOT DETECTED Final   A.calcoaceticus-baumannii NOT DETECTED NOT DETECTED Final   Bacteroides fragilis NOT DETECTED NOT DETECTED Final   Enterobacterales DETECTED (A) NOT DETECTED Final    Comment: Enterobacterales  represent a large order of gram negative bacteria, not a single organism. CRITICAL RESULT CALLED TO, READ BACK BY AND VERIFIED WITH: NATHAN BLUE_[id number]  07/29/21 RH    Enterobacter cloacae complex DETECTED (A) NOT DETECTED Final    Comment: CRITICAL RESULT CALLED TO, READ BACK BY AND VERIFIED WITH: NATHAN BLUE_[email or web address]  07/29/21 RH    Escherichia coli NOT DETECTED NOT DETECTED Final   Klebsiella aerogenes NOT DETECTED NOT DETECTED Final   Klebsiella oxytoca NOT DETECTED NOT DETECTED Final   Klebsiella pneumoniae NOT DETECTED NOT DETECTED Final   Proteus species NOT DETECTED NOT DETECTED Final   Salmonella species NOT DETECTED NOT DETECTED Final   Serratia marcescens NOT DETECTED NOT DETECTED Final   Haemophilus influenzae NOT DETECTED NOT DETECTED Final   Neisseria meningitidis NOT DETECTED NOT DETECTED Final   Pseudomonas aeruginosa NOT DETECTED NOT DETECTED Final   Stenotrophomonas maltophilia NOT DETECTED NOT DETECTED Final   Candida albicans NOT DETECTED NOT DETECTED Final   Candida auris NOT DETECTED NOT DETECTED Final   Candida glabrata NOT DETECTED NOT DETECTED Final   Candida krusei NOT DETECTED NOT DETECTED Final   Candida parapsilosis NOT DETECTED NOT DETECTED Final   Candida tropicalis NOT DETECTED NOT  DETECTED Final   Cryptococcus neoformans/gattii NOT DETECTED NOT DETECTED Final   CTX-M ESBL NOT DETECTED NOT DETECTED Final   Carbapenem resistance IMP NOT DETECTED NOT DETECTED Final   Carbapenem resistance KPC NOT DETECTED NOT DETECTED Final   Carbapenem resistance NDM NOT DETECTED NOT DETECTED Final   Carbapenem resist OXA 48 LIKE NOT DETECTED NOT DETECTED Final   Carbapenem resistance VIM NOT DETECTED NOT DETECTED Final    Comment: Performed at [facility], [address] [place], [place] [address]  Resp Panel by RT-PCR (Flu A&B, Covid) Nasopharyngeal Swab     Status: None   Collection Time: 07/27/21 12:07 PM   Specimen: Nasopharyngeal Swab; Nasopharyngeal(NP) swabs in vial transport medium  Result Value Ref Range Status   SARS Coronavirus 2 by RT PCR NEGATIVE NEGATIVE Final    Comment: (NOTE) SARS-CoV-2 target nucleic acids are NOT DETECTED.  The SARS-CoV-2 RNA is generally detectable in upper respiratory specimens during the acute phase of infection. The lowest concentration of SARS-CoV-2 viral copies this assay can detect is 138 copies/mL. A negative result does not preclude SARS-Cov-2 infection and should not be used as the sole basis for treatment or other patient management decisions. A negative result may occur with  improper specimen collection/handling, submission of specimen other than nasopharyngeal swab, presence of viral mutation(s) within the areas targeted by this assay, and inadequate number of viral copies(<138 copies/mL). A negative result must be combined with clinical observations, patient history, and epidemiological information. The expected result is Negative.  Fact Sheet for Patients:  EntrepreneurPulse.com.au  Fact Sheet for Healthcare Providers:  IncredibleEmployment.be  This test is no t yet approved or cleared by the [place] FDA and  has been authorized for detection and/or diagnosis of  SARS-CoV-2 by FDA under an Emergency Use Authorization (EUA). This EUA will remain  in effect (meaning this test can be used) for the duration of the COVID-19 declaration under Section 564(b)(1) of the Act, 21 U.S.C.section 360bbb-3(b)(1), unless the authorization is terminated  or revoked sooner.       Influenza A by PCR NEGATIVE NEGATIVE Final   Influenza B by PCR NEGATIVE NEGATIVE Final    Comment: (NOTE) The Xpert Xpress SARS-CoV-2/FLU/RSV plus assay is intended as an aid in the diagnosis of influenza from  Nasopharyngeal swab specimens and should not be used as a sole basis for treatment. Nasal washings and aspirates are unacceptable for Xpert Xpress SARS-CoV-2/FLU/RSV testing.  Fact Sheet for Patients: EntrepreneurPulse.com.au  Fact Sheet for Healthcare Providers: IncredibleEmployment.be  This test is not yet approved or cleared by the [place] FDA and has been authorized for detection and/or diagnosis of SARS-CoV-2 by FDA under an Emergency Use Authorization (EUA). This EUA will remain in effect (meaning this test can be used) for the duration of the COVID-19 declaration under Section 564(b)(1) of the Act, 21 U.S.C. section 360bbb-3(b)(1), unless the authorization is terminated or revoked.  Performed at [facility], [address] [place], [place] [address]          Radiology Studies: DG Chest 2 View  Result Date: 07/27/2021 CLINICAL DATA:  Pt here from his doctor's office with possible sepsis. Pt had a fever at home but took tylenol at 0800. Pt states that he feels weak and is nauseous. Pt also states bilateral flank painSuspected Sepsis EXAM: CHEST - 2 VIEW COMPARISON:  07/05/2021 FINDINGS: Normal mediastinum and cardiac silhouette. Normal pulmonary vasculature. No evidence of effusion, infiltrate, or pneumothorax. No acute bony abnormality. IMPRESSION: No acute cardiopulmonary process. Electronically Signed   By:  Suzy Bouchard M.D.   On: 07/27/2021 11:27   DG Abd 1 View  Result Date: 07/28/2021 CLINICAL DATA:  Ureteral stent. EXAM: ABDOMEN - 1 VIEW COMPARISON:  Mar 15, 2021. FINDINGS: The bowel gas pattern is normal. Left-sided ureteral stent is noted. Postsurgical changes are seen involving the lumbar spine. IMPRESSION: Left-sided ureteral stent is noted. No abnormal bowel dilatation is noted. Electronically Signed   By: Marijo Conception M.D.   On: 07/28/2021 18:09        Scheduled Meds:  apixaban  5 mg Oral BID   diltiazem  180 mg Oral BID   fluticasone furoate-vilanterol  1 puff Inhalation Daily   And   umeclidinium bromide  1 puff Inhalation Daily   hydrochlorothiazide  12.5 mg Oral Daily   lisinopril  20 mg Oral QHS   loratadine  10 mg Oral Daily   metoprolol succinate  25 mg Oral Daily   montelukast  10 mg Oral QHS   pantoprazole  40 mg Oral QPM   potassium chloride SA  20 mEq Oral Daily   potassium chloride  40 mEq Oral QHS   predniSONE  10 mg Oral Q breakfast   senna-docusate  1 tablet Oral BID   tamsulosin  0.4 mg Oral Daily   Continuous Infusions:  ceFEPime (MAXIPIME) IV 2 g (07/29/21 0510)     LOS: 2 days    Time spent: 30 mins     Wyvonnia Dusky, MD [facility] Pager [phone]  If 7PM-7AM, please contact night-coverage 07/29/2021, 8:01 AM

## 2021-07-30 ENCOUNTER — Encounter: Payer: Self-pay | Admitting: Internal Medicine

## 2021-07-30 ENCOUNTER — Inpatient Hospital Stay: Payer: Medicare PPO

## 2021-07-30 DIAGNOSIS — R7881 Bacteremia: Secondary | ICD-10-CM | POA: Diagnosis not present

## 2021-07-30 DIAGNOSIS — I10 Essential (primary) hypertension: Secondary | ICD-10-CM | POA: Diagnosis not present

## 2021-07-30 DIAGNOSIS — N390 Urinary tract infection, site not specified: Secondary | ICD-10-CM | POA: Diagnosis not present

## 2021-07-30 LAB — BASIC METABOLIC PANEL
Anion gap: 9 (ref 5–15)
BUN: 20 mg/dL (ref 8–23)
CO2: 24 mmol/L (ref 22–32)
Calcium: 8.6 mg/dL — ABNORMAL LOW (ref 8.9–10.3)
Chloride: 104 mmol/L (ref 98–111)
Creatinine, Ser: 1.01 mg/dL (ref 0.61–1.24)
GFR, Estimated: >60 mL/min (ref >60)
Glucose, Bld: 116 mg/dL — ABNORMAL HIGH (ref 70–99)
Potassium: 3.1 mmol/L — ABNORMAL LOW (ref 3.5–5.1)
Sodium: 137 mmol/L (ref 135–145)

## 2021-07-30 LAB — CBC
HCT: 31.9 % — ABNORMAL LOW (ref 39.0–52.0)
Hemoglobin: 10.5 g/dL — ABNORMAL LOW (ref 13.0–17.0)
MCH: 25.5 pg — ABNORMAL LOW (ref 26.0–34.0)
MCHC: 32.9 g/dL (ref 30.0–36.0)
MCV: 77.6 fL — ABNORMAL LOW (ref 80.0–100.0)
Platelets: 185 10*3/uL (ref 150–400)
RBC: 4.11 MIL/uL — ABNORMAL LOW (ref 4.22–5.81)
RDW: 17.1 % — ABNORMAL HIGH (ref 11.5–15.5)
WBC: 9.1 10*3/uL (ref 4.0–10.5)
nRBC: 0 % (ref 0.0–0.2)

## 2021-07-30 LAB — MAGNESIUM: Magnesium: 1.8 mg/dL (ref 1.7–2.4)

## 2021-07-30 LAB — HIV-1/2 AB - DIFFERENTIATION
HIV 1 Ab: NONREACTIVE
HIV 2 Ab: NONREACTIVE
Note: NEGATIVE

## 2021-07-30 LAB — HIV-1/HIV-2 QUALITATIVE RNA
Final Interpretation: NEGATIVE
HIV-1 RNA, Qualitative: NONREACTIVE
HIV-2 RNA, Qualitative: NONREACTIVE

## 2021-07-30 MED ORDER — POTASSIUM CHLORIDE CRYS ER 20 MEQ PO TBCR: 20.0000 meq | EXTENDED_RELEASE_TABLET | ORAL | Status: DC

## 2021-07-30 MED ORDER — SODIUM CHLORIDE 0.9 % IV SOLN: INTRAVENOUS | Status: DC

## 2021-07-30 MED ORDER — IOHEXOL 350 MG/ML SOLN
100.0000 mL | Freq: Once | INTRAVENOUS | Status: AC | PRN
  Administered 2021-07-30: 100 mL via INTRAVENOUS

## 2021-07-30 MED ORDER — POTASSIUM CHLORIDE CRYS ER 20 MEQ PO TBCR
40.0000 meq | EXTENDED_RELEASE_TABLET | Freq: Once | ORAL | Status: AC
  Administered 2021-07-30: 40 meq via ORAL
  Filled 2021-07-30: qty 2

## 2021-07-30 NOTE — Progress Notes (Addendum)
PROGRESS NOTE    [name]  IEP:[id number] DOB: 02-05-1956 DOA: 07/27/2021 PCP: Juline Patch, MD   Assessment & Plan:   Active Problems:   Reactive airway disease   Essential (primary) hypertension   Ureteral stricture   Sepsis secondary to UTI ([place])   Sepsis: met criteria w/ lactic acidosis, leukocytosis, tachycardia, fever & likely UTI. Has ureteral stent in place. Continue on IV cefepime. Urology recs apprec       UTI: urine cx shows insignificant growth . Continue on IV cefepime. CT abd/pelvis showed ureteral stent in good position, no hydroureteronephrosis. Will get stent removed as an outpatient as per urology   Bacteremia: blood cxs growing enterobacter, sens pending still. Continue on IV cefepime   Initial HIV test positive: likely false positive as per ID. Confirmatory test is pending   Leukocytosis: resolved    HTN: continue on diltiazem    Reactive airway disease: continue on bronchodilators. Encourage incentive spirometry & flutter valve    PAF: continue on home dose of diltiazem, eliquis   Obesity: BMI 31.6. Complicates overall care & prognosis    DVT prophylaxis: eliquis  Code Status:  full  Family Communication: Disposition Plan: likely d/c back home   Level of care: Med-Surg  Status is: Inpatient  Remains inpatient appropriate because:IV treatments appropriate due to intensity of illness or inability to take PO and Inpatient level of care appropriate due to severity of illness  Dispo: The patient is from: Home              Anticipated d/c is to: Home              Patient currently is not medically stable to d/c.   Difficult to place patient : unclear        Consultants:  Urology: Dr. Erlene Quan   Procedures:  Antimicrobials: cefepime    Subjective: Pt c/o malaise  Objective: Vitals:   07/29/21 1939 07/29/21 2019 07/30/21 0444 07/30/21 0732  BP: (!) 159/98 (!) 152/103 (!) 156/106 (!) 156/99  Pulse: (!) 56  70 (!) 46   Resp: 20  16 19   Temp: 98.6 F (37 C)  100.3 F (37.9 C) 98 F (36.7 C)  TempSrc: Oral  Oral Oral  SpO2: 97%  95% 94%  Weight:      Height:        Intake/Output Summary (Last 24 hours) at 07/30/2021 0819 Last data filed at 07/30/2021 0400 Gross per 24 hour  Intake 820 ml  Output 2050 ml  Net -1230 ml   Filed Weights   07/27/21 1033  Weight: 108.9 kg    Examination:  General exam: Appears calm but uncomfortable  Respiratory system: diminished breath sounds b/l  Cardiovascular system: S1 & S2+. No rubs or clicks  Gastrointestinal system: Abd is soft, NT, obese & hypoactive bowel sounds   Central nervous system: Alert and oriented. Moves all extremities  Psychiatry: judgement and insight appear normal. Flat mood and affect    Data Reviewed: I have personally reviewed following labs and imaging studies  CBC: Recent Labs  Lab 07/27/21 1043 07/28/21 0431 07/29/21 0304 07/30/21 0449  WBC 15.4* 14.1* 12.7* 9.1  NEUTROABS 13.7*  --   --   --   HGB 12.3* 11.5* 10.1* 10.5*  HCT 39.8 36.1* 32.0* 31.9*  MCV 78.7* 79.5* 78.2* 77.6*  PLT 199 165 172 660   Basic Metabolic Panel: Recent Labs  Lab 07/27/21 1043 07/28/21 0431 07/29/21 0304 07/30/21 0449  NA 133*  139 138 137  K 3.4* 4.2 3.7 3.1*  CL 98 110 111 104  CO2 24 25 22 24   GLUCOSE 182* 262* 182* 116*  BUN 14 21 32* 20  CREATININE 1.12 1.10 0.98 1.01  CALCIUM 8.9 8.5* 8.5* 8.6*   GFR: Estimated Creatinine Clearance: 91.9 mL/min (by C-G formula based on SCr of 1.01 mg/dL). Liver Function Tests: Recent Labs  Lab 07/27/21 1043  AST 23  ALT 24  ALKPHOS 73  BILITOT 1.1  PROT 7.5  ALBUMIN 3.5   No results for input(s): LIPASE, AMYLASE in the last 168 hours. No results for input(s): AMMONIA in the last 168 hours. Coagulation Profile: Recent Labs  Lab 07/27/21 1043 07/27/21 1354  INR 1.7* 2.2*   Cardiac Enzymes: No results for input(s): CKTOTAL, CKMB, CKMBINDEX, TROPONINI in the last 168  hours. BNP (last 3 results) No results for input(s): PROBNP in the last 8760 hours. HbA1C: No results for input(s): HGBA1C in the last 72 hours. CBG: No results for input(s): GLUCAP in the last 168 hours. Lipid Profile: No results for input(s): CHOL, HDL, LDLCALC, TRIG, CHOLHDL, LDLDIRECT in the last 72 hours. Thyroid Function Tests: No results for input(s): TSH, T4TOTAL, FREET4, T3FREE, THYROIDAB in the last 72 hours. Anemia Panel: No results for input(s): VITAMINB12, FOLATE, FERRITIN, TIBC, IRON, RETICCTPCT in the last 72 hours. Sepsis Labs: Recent Labs  Lab 07/27/21 1044 07/27/21 1354  LATICACIDVEN 2.5* 1.9    Recent Results (from the past 240 hour(s))  Culture, blood (Routine x 2)     Status: None (Preliminary result)   Collection Time: 07/27/21 10:44 AM   Specimen: BLOOD  Result Value Ref Range Status   Specimen Description BLOOD RIGHT [facility]  Final   Special Requests   Final    BOTTLES DRAWN AEROBIC AND ANAEROBIC Blood Culture adequate volume   Culture   Final    NO GROWTH 3 DAYS Performed at [facility], [address] [place], [place] [address]    Report Status PENDING  Incomplete  Culture, blood (Routine x 2)     Status: Abnormal (Preliminary result)   Collection Time: 07/27/21 11:27 AM   Specimen: BLOOD  Result Value Ref Range Status   Specimen Description   Final    BLOOD BLOOD LEFT HAND Performed at [facility] [address], [place], [place] [address]    Special Requests   Final    BOTTLES DRAWN AEROBIC AND ANAEROBIC Blood Culture results may not be optimal due to an inadequate volume of blood received in culture bottles Performed at [facility], [address] [place], [place] [address]    Culture  Setup Time   Final    GRAM NEGATIVE RODS BOTTLES DRAWN AEROBIC ONLY CRITICAL RESULT CALLED TO, READ BACK BY AND VERIFIED WITH: NATHAN BLUE@0621  07/29/21 RH    Culture (A)  Final    ENTEROBACTER SPECIES SUSCEPTIBILITIES TO  FOLLOW Performed at [facility], [place] [address] [place], [place] [address]    Report Status PENDING  Incomplete  Blood Culture ID Panel (Reflexed)     Status: Abnormal   Collection Time: 07/27/21 11:27 AM  Result Value Ref Range Status   Enterococcus faecalis NOT DETECTED NOT DETECTED Final   Enterococcus Faecium NOT DETECTED NOT DETECTED Final   Listeria monocytogenes NOT DETECTED NOT DETECTED Final   Staphylococcus species NOT DETECTED NOT DETECTED Final   Staphylococcus aureus (BCID) NOT DETECTED NOT DETECTED Final   Staphylococcus epidermidis NOT DETECTED NOT DETECTED  Final   Staphylococcus lugdunensis NOT DETECTED NOT DETECTED Final   Streptococcus species NOT DETECTED NOT DETECTED Final   Streptococcus agalactiae NOT DETECTED NOT DETECTED Final   Streptococcus pneumoniae NOT DETECTED NOT DETECTED Final   Streptococcus pyogenes NOT DETECTED NOT DETECTED Final   A.calcoaceticus-baumannii NOT DETECTED NOT DETECTED Final   Bacteroides fragilis NOT DETECTED NOT DETECTED Final   Enterobacterales DETECTED (A) NOT DETECTED Final    Comment: Enterobacterales represent a large order of gram negative bacteria, not a single organism. CRITICAL RESULT CALLED TO, READ BACK BY AND VERIFIED WITH: NATHAN BLUE@0621  07/29/21 RH    Enterobacter cloacae complex DETECTED (A) NOT DETECTED Final    Comment: CRITICAL RESULT CALLED TO, READ BACK BY AND VERIFIED WITH: NATHAN BLUE@0621  07/29/21 RH    Escherichia coli NOT DETECTED NOT DETECTED Final   Klebsiella aerogenes NOT DETECTED NOT DETECTED Final   Klebsiella oxytoca NOT DETECTED NOT DETECTED Final   Klebsiella pneumoniae NOT DETECTED NOT DETECTED Final   Proteus species NOT DETECTED NOT DETECTED Final   Salmonella species NOT DETECTED NOT DETECTED Final   Serratia marcescens NOT DETECTED NOT DETECTED Final   Haemophilus influenzae NOT DETECTED NOT DETECTED Final   Neisseria meningitidis NOT DETECTED NOT DETECTED Final   Pseudomonas  aeruginosa NOT DETECTED NOT DETECTED Final   Stenotrophomonas maltophilia NOT DETECTED NOT DETECTED Final   Candida albicans NOT DETECTED NOT DETECTED Final   Candida auris NOT DETECTED NOT DETECTED Final   Candida glabrata NOT DETECTED NOT DETECTED Final   Candida krusei NOT DETECTED NOT DETECTED Final   Candida parapsilosis NOT DETECTED NOT DETECTED Final   Candida tropicalis NOT DETECTED NOT DETECTED Final   Cryptococcus neoformans/gattii NOT DETECTED NOT DETECTED Final   CTX-M ESBL NOT DETECTED NOT DETECTED Final   Carbapenem resistance IMP NOT DETECTED NOT DETECTED Final   Carbapenem resistance KPC NOT DETECTED NOT DETECTED Final   Carbapenem resistance NDM NOT DETECTED NOT DETECTED Final   Carbapenem resist OXA 48 LIKE NOT DETECTED NOT DETECTED Final   Carbapenem resistance VIM NOT DETECTED NOT DETECTED Final    Comment: Performed at [facility], [address] [place], [place] [address]  Resp Panel by RT-PCR (Flu A&B, Covid) Nasopharyngeal Swab     Status: None   Collection Time: 07/27/21 12:07 PM   Specimen: Nasopharyngeal Swab; Nasopharyngeal(NP) swabs in vial transport medium  Result Value Ref Range Status   SARS Coronavirus 2 by RT PCR NEGATIVE NEGATIVE Final    Comment: (NOTE) SARS-CoV-2 target nucleic acids are NOT DETECTED.  The SARS-CoV-2 RNA is generally detectable in upper respiratory specimens during the acute phase of infection. The lowest concentration of SARS-CoV-2 viral copies this assay can detect is 138 copies/mL. A negative result does not preclude SARS-Cov-2 infection and should not be used as the sole basis for treatment or other patient management decisions. A negative result may occur with  improper specimen collection/handling, submission of specimen other than nasopharyngeal swab, presence of viral mutation(s) within the areas targeted by this assay, and inadequate number of viral copies(<138 copies/mL). A negative result must be combined  with clinical observations, patient history, and epidemiological information. The expected result is Negative.  Fact Sheet for Patients:  EntrepreneurPulse.com.au  Fact Sheet for Healthcare Providers:  IncredibleEmployment.be  This test is no t yet approved or cleared by the [place] FDA and  has been authorized for detection and/or diagnosis of SARS-CoV-2 by FDA under an Emergency Use Authorization (EUA). This EUA will remain  in  effect (meaning this test can be used) for the duration of the COVID-19 declaration under Section 564(b)(1) of the Act, 21 U.S.C.section 360bbb-3(b)(1), unless the authorization is terminated  or revoked sooner.       Influenza A by PCR NEGATIVE NEGATIVE Final   Influenza B by PCR NEGATIVE NEGATIVE Final    Comment: (NOTE) The Xpert Xpress SARS-CoV-2/FLU/RSV plus assay is intended as an aid in the diagnosis of influenza from Nasopharyngeal swab specimens and should not be used as a sole basis for treatment. Nasal washings and aspirates are unacceptable for Xpert Xpress SARS-CoV-2/FLU/RSV testing.  Fact Sheet for Patients: EntrepreneurPulse.com.au  Fact Sheet for Healthcare Providers: IncredibleEmployment.be  This test is not yet approved or cleared by the [place] FDA and has been authorized for detection and/or diagnosis of SARS-CoV-2 by FDA under an Emergency Use Authorization (EUA). This EUA will remain in effect (meaning this test can be used) for the duration of the COVID-19 declaration under Section 564(b)(1) of the Act, 21 U.S.C. section 360bbb-3(b)(1), unless the authorization is terminated or revoked.  Performed at [facility], [address] [place], [place] [address]   Urine Culture     Status: Abnormal   Collection Time: 07/28/21 11:17 AM   Specimen: Urine, Clean Catch  Result Value Ref Range Status   Specimen Description   Final     URINE, CLEAN CATCH Performed at [facility], [address], [place], [place] [address]    Special Requests   Final    NONE Performed at [facility], [address], [place], [place] [address]    Culture (A)  Final    <10,000 COLONIES/mL INSIGNIFICANT GROWTH Performed at [facility] [address], [place], [place] [address]    Report Status 07/29/2021 FINAL  Final         Radiology Studies: DG Abd 1 View  Result Date: 07/28/2021 CLINICAL DATA:  Ureteral stent. EXAM: ABDOMEN - 1 VIEW COMPARISON:  Mar 15, 2021. FINDINGS: The bowel gas pattern is normal. Left-sided ureteral stent is noted. Postsurgical changes are seen involving the lumbar spine. IMPRESSION: Left-sided ureteral stent is noted. No abnormal bowel dilatation is noted. Electronically Signed   By: Marijo Conception M.D.   On: 07/28/2021 18:09        Scheduled Meds:  apixaban  5 mg Oral BID   diltiazem  180 mg Oral BID   fluticasone furoate-vilanterol  1 puff Inhalation Daily   And   umeclidinium bromide  1 puff Inhalation Daily   hydrochlorothiazide  12.5 mg Oral Daily   lisinopril  20 mg Oral QHS   loratadine  10 mg Oral Daily   metoprolol succinate  50 mg Oral Daily   montelukast  10 mg Oral QHS   pantoprazole  40 mg Oral QPM   potassium chloride  40 mEq Oral Once   predniSONE  10 mg Oral Q breakfast   senna-docusate  1 tablet Oral BID   tamsulosin  0.4 mg Oral Daily   Continuous Infusions:  ceFEPime (MAXIPIME) IV 2 g (07/30/21 0500)     LOS: 3 days    Time spent: 25 mins     Wyvonnia Dusky, MD [facility] Pager [phone]  If 7PM-7AM, please contact night-coverage 07/30/2021, 8:19 AM

## 2021-07-30 NOTE — Care Management Important Message (Signed)
Important Message  Patient Details  Name: [name] MRN: [id number] Date of Birth: January 14, 1956   Medicare Important Message Given:  Yes     Dannette Barbara 07/30/2021, 11:45 AM

## 2021-07-31 DIAGNOSIS — R7881 Bacteremia: Secondary | ICD-10-CM | POA: Diagnosis not present

## 2021-07-31 DIAGNOSIS — I480 Paroxysmal atrial fibrillation: Secondary | ICD-10-CM | POA: Diagnosis not present

## 2021-07-31 DIAGNOSIS — N390 Urinary tract infection, site not specified: Secondary | ICD-10-CM | POA: Diagnosis not present

## 2021-07-31 LAB — BASIC METABOLIC PANEL
Anion gap: 8 (ref 5–15)
BUN: 15 mg/dL (ref 8–23)
CO2: 27 mmol/L (ref 22–32)
Calcium: 8.5 mg/dL — ABNORMAL LOW (ref 8.9–10.3)
Chloride: 99 mmol/L (ref 98–111)
Creatinine, Ser: 0.98 mg/dL (ref 0.61–1.24)
GFR, Estimated: >60 mL/min (ref >60)
Glucose, Bld: 136 mg/dL — ABNORMAL HIGH (ref 70–99)
Potassium: 2.9 mmol/L — ABNORMAL LOW (ref 3.5–5.1)
Sodium: 134 mmol/L — ABNORMAL LOW (ref 135–145)

## 2021-07-31 LAB — MAGNESIUM: Magnesium: 1.8 mg/dL (ref 1.7–2.4)

## 2021-07-31 LAB — CBC
HCT: 33.4 % — ABNORMAL LOW (ref 39.0–52.0)
Hemoglobin: 10.7 g/dL — ABNORMAL LOW (ref 13.0–17.0)
MCH: 24.8 pg — ABNORMAL LOW (ref 26.0–34.0)
MCHC: 32 g/dL (ref 30.0–36.0)
MCV: 77.3 fL — ABNORMAL LOW (ref 80.0–100.0)
Platelets: 213 10*3/uL (ref 150–400)
RBC: 4.32 MIL/uL (ref 4.22–5.81)
RDW: 17.2 % — ABNORMAL HIGH (ref 11.5–15.5)
WBC: 9.8 10*3/uL (ref 4.0–10.5)
nRBC: 0 % (ref 0.0–0.2)

## 2021-07-31 LAB — CULTURE, BLOOD (ROUTINE X 2)

## 2021-07-31 MED ORDER — CIPROFLOXACIN IN D5W 400 MG/200ML IV SOLN
400.0000 mg | Freq: Two times a day (BID) | INTRAVENOUS | Status: DC
  Administered 2021-07-31 – 2021-08-02 (×5): 400 mg via INTRAVENOUS
  Filled 2021-07-31 (×5): qty 200

## 2021-07-31 MED ORDER — POTASSIUM CHLORIDE CRYS ER 20 MEQ PO TBCR
40.0000 meq | EXTENDED_RELEASE_TABLET | Freq: Two times a day (BID) | ORAL | Status: AC
  Administered 2021-07-31 (×2): 40 meq via ORAL
  Filled 2021-07-31 (×2): qty 2

## 2021-07-31 MED ORDER — MAGNESIUM SULFATE 2 GM/50ML IV SOLN
2.0000 g | Freq: Once | INTRAVENOUS | Status: AC
  Administered 2021-07-31: 2 g via INTRAVENOUS
  Filled 2021-07-31: qty 50

## 2021-07-31 NOTE — Progress Notes (Signed)
PROGRESS NOTE    [name]  HGD:[id number] DOB: Aug 19, 1956 DOA: 07/27/2021 PCP: Juline Patch, MD   Assessment & Plan:   Active Problems:   Reactive airway disease   Essential (primary) hypertension   Ureteral stricture   Sepsis secondary to UTI ([place])   Sepsis: met criteria w/ lactic acidosis, leukocytosis, tachycardia, fever & likely UTI. Has ureteral stent in place. Abxs switched to cipro. Sepsis has resolved    Bacteremia: blood cxs growing enterobacter cloacae. Changed abxs to cipro. Still spiking low grade fevers. Repeat blood cxs ordered   UTI: urine cx shows containment. Abxs switched to cipro. CT abd/pelvis showed ureteral stent in good position, no hydroureteronephrosis. Will get stent removed as an outpatient as per urology   HIV neg: screening was positive but confirmatory was neg. False positive   Leukocytosis: resolved    HTN: continue on CCB   Reactive airway disease: continue on bronchodilators. Encourage incentive spirometry & flutter valve    PAF: continue on CCB, eliquis   Obesity: BMI 31.6. Complicates overall care & prognosis     DVT prophylaxis: eliquis  Code Status:  full  Family Communication: Disposition Plan: likely d/c back home   Level of care: Med-Surg  Status is: Inpatient  Remains inpatient appropriate because:IV treatments appropriate due to intensity of illness or inability to take PO and Inpatient level of care appropriate due to severity of illness  Dispo: The patient is from: Home              Anticipated d/c is to: Home              Patient currently is not medically stable to d/c.   Difficult to place patient : unclear        Consultants:  Urology: Dr. Erlene Quan   Procedures:  Antimicrobials: cipro    Subjective: Pt c/o fatigue and cough  Objective: Vitals:   07/30/21 1906 07/31/21 0331 07/31/21 0437 07/31/21 0500  BP: (!) 119/92 (!) 158/107    Pulse: 70 81    Resp: 16 20    Temp: 98.9 F (37.2 C)  (!) 100.7 F (38.2 C) 99.2 F (37.3 C) 98.6 F (37 C)  TempSrc: Oral Oral    SpO2: 96% 95%    Weight:      Height:        Intake/Output Summary (Last 24 hours) at 07/31/2021 0803 Last data filed at 07/31/2021 0320 Gross per 24 hour  Intake 1638.77 ml  Output 2700 ml  Net -1061.23 ml   Filed Weights   07/27/21 1033  Weight: 108.9 kg    Examination:  General exam: Appears comfortable  Respiratory system: decreased breath sounds b/l  Cardiovascular system: irregularly irregular. No clicks  Gastrointestinal system: Abd is soft, NT, obese & normal bowel sounds  Central nervous system: Alert and oriented. Moves all extremities  Psychiatry: judgement and insight appear normal. Flat mood and affect     Data Reviewed: I have personally reviewed following labs and imaging studies  CBC: Recent Labs  Lab 07/27/21 1043 07/28/21 0431 07/29/21 0304 07/30/21 0449 07/31/21 0422  WBC 15.4* 14.1* 12.7* 9.1 9.8  NEUTROABS 13.7*  --   --   --   --   HGB 12.3* 11.5* 10.1* 10.5* 10.7*  HCT 39.8 36.1* 32.0* 31.9* 33.4*  MCV 78.7* 79.5* 78.2* 77.6* 77.3*  PLT 199 165 172 185 962   Basic Metabolic Panel: Recent Labs  Lab 07/27/21 1043 07/28/21 0431 07/29/21 0304 07/30/21 0449  07/31/21 0422  NA 133* 139 138 137 134*  K 3.4* 4.2 3.7 3.1* 2.9*  CL 98 110 111 104 99  CO2 24 25 22 24 27   GLUCOSE 182* 262* 182* 116* 136*  BUN 14 21 32* 20 15  CREATININE 1.12 1.10 0.98 1.01 0.98  CALCIUM 8.9 8.5* 8.5* 8.6* 8.5*  MG  --   --   --  1.8  --    GFR: Estimated Creatinine Clearance: 94.7 mL/min (by C-G formula based on SCr of 0.98 mg/dL). Liver Function Tests: Recent Labs  Lab 07/27/21 1043  AST 23  ALT 24  ALKPHOS 73  BILITOT 1.1  PROT 7.5  ALBUMIN 3.5   No results for input(s): LIPASE, AMYLASE in the last 168 hours. No results for input(s): AMMONIA in the last 168 hours. Coagulation Profile: Recent Labs  Lab 07/27/21 1043 07/27/21 1354  INR 1.7* 2.2*   Cardiac  Enzymes: No results for input(s): CKTOTAL, CKMB, CKMBINDEX, TROPONINI in the last 168 hours. BNP (last 3 results) No results for input(s): PROBNP in the last 8760 hours. HbA1C: No results for input(s): HGBA1C in the last 72 hours. CBG: No results for input(s): GLUCAP in the last 168 hours. Lipid Profile: No results for input(s): CHOL, HDL, LDLCALC, TRIG, CHOLHDL, LDLDIRECT in the last 72 hours. Thyroid Function Tests: No results for input(s): TSH, T4TOTAL, FREET4, T3FREE, THYROIDAB in the last 72 hours. Anemia Panel: No results for input(s): VITAMINB12, FOLATE, FERRITIN, TIBC, IRON, RETICCTPCT in the last 72 hours. Sepsis Labs: Recent Labs  Lab 07/27/21 1044 07/27/21 1354  LATICACIDVEN 2.5* 1.9    Recent Results (from the past 240 hour(s))  Culture, blood (Routine x 2)     Status: None (Preliminary result)   Collection Time: 07/27/21 10:44 AM   Specimen: BLOOD  Result Value Ref Range Status   Specimen Description BLOOD RIGHT [facility]  Final   Special Requests   Final    BOTTLES DRAWN AEROBIC AND ANAEROBIC Blood Culture adequate volume   Culture   Final    NO GROWTH 4 DAYS Performed at [facility], [address] [place], [place] [address]    Report Status PENDING  Incomplete  Culture, blood (Routine x 2)     Status: Abnormal (Preliminary result)   Collection Time: 07/27/21 11:27 AM   Specimen: BLOOD  Result Value Ref Range Status   Specimen Description   Final    BLOOD BLOOD LEFT HAND Performed at [facility], [address], [place], [place] [address]    Special Requests   Final    BOTTLES DRAWN AEROBIC AND ANAEROBIC Blood Culture results may not be optimal due to an inadequate volume of blood received in culture bottles Performed at [facility], [address] [place], [place] [address]    Culture  Setup Time   Final    GRAM NEGATIVE RODS BOTTLES DRAWN AEROBIC ONLY CRITICAL RESULT CALLED TO, READ BACK BY AND VERIFIED WITH: NATHAN  BLUE@0621  07/29/21 RH    Culture (A)  Final    ENTEROBACTER SPECIES SUSCEPTIBILITIES TO FOLLOW Performed at [facility], [place] [address], [place], [place] [address]    Report Status PENDING  Incomplete  Blood Culture ID Panel (Reflexed)     Status: Abnormal   Collection Time: 07/27/21 11:27 AM  Result Value Ref Range Status   Enterococcus faecalis NOT DETECTED NOT DETECTED Final   Enterococcus Faecium NOT DETECTED NOT DETECTED Final   Listeria monocytogenes NOT DETECTED NOT DETECTED  Final   Staphylococcus species NOT DETECTED NOT DETECTED Final   Staphylococcus aureus (BCID) NOT DETECTED NOT DETECTED Final   Staphylococcus epidermidis NOT DETECTED NOT DETECTED Final   Staphylococcus lugdunensis NOT DETECTED NOT DETECTED Final   Streptococcus species NOT DETECTED NOT DETECTED Final   Streptococcus agalactiae NOT DETECTED NOT DETECTED Final   Streptococcus pneumoniae NOT DETECTED NOT DETECTED Final   Streptococcus pyogenes NOT DETECTED NOT DETECTED Final   A.calcoaceticus-baumannii NOT DETECTED NOT DETECTED Final   Bacteroides fragilis NOT DETECTED NOT DETECTED Final   Enterobacterales DETECTED (A) NOT DETECTED Final    Comment: Enterobacterales represent a large order of gram negative bacteria, not a single organism. CRITICAL RESULT CALLED TO, READ BACK BY AND VERIFIED WITH: NATHAN BLUE@0621  07/29/21 RH    Enterobacter cloacae complex DETECTED (A) NOT DETECTED Final    Comment: CRITICAL RESULT CALLED TO, READ BACK BY AND VERIFIED WITH: NATHAN BLUE@0621  07/29/21 RH    Escherichia coli NOT DETECTED NOT DETECTED Final   Klebsiella aerogenes NOT DETECTED NOT DETECTED Final   Klebsiella oxytoca NOT DETECTED NOT DETECTED Final   Klebsiella pneumoniae NOT DETECTED NOT DETECTED Final   Proteus species NOT DETECTED NOT DETECTED Final   Salmonella species NOT DETECTED NOT DETECTED Final   Serratia marcescens NOT DETECTED NOT DETECTED Final   Haemophilus influenzae NOT DETECTED NOT  DETECTED Final   Neisseria meningitidis NOT DETECTED NOT DETECTED Final   Pseudomonas aeruginosa NOT DETECTED NOT DETECTED Final   Stenotrophomonas maltophilia NOT DETECTED NOT DETECTED Final   Candida albicans NOT DETECTED NOT DETECTED Final   Candida auris NOT DETECTED NOT DETECTED Final   Candida glabrata NOT DETECTED NOT DETECTED Final   Candida krusei NOT DETECTED NOT DETECTED Final   Candida parapsilosis NOT DETECTED NOT DETECTED Final   Candida tropicalis NOT DETECTED NOT DETECTED Final   Cryptococcus neoformans/gattii NOT DETECTED NOT DETECTED Final   CTX-M ESBL NOT DETECTED NOT DETECTED Final   Carbapenem resistance IMP NOT DETECTED NOT DETECTED Final   Carbapenem resistance KPC NOT DETECTED NOT DETECTED Final   Carbapenem resistance NDM NOT DETECTED NOT DETECTED Final   Carbapenem resist OXA 48 LIKE NOT DETECTED NOT DETECTED Final   Carbapenem resistance VIM NOT DETECTED NOT DETECTED Final    Comment: Performed at [facility], [address] [place], [place] [address]  Resp Panel by RT-PCR (Flu A&B, Covid) Nasopharyngeal Swab     Status: None   Collection Time: 07/27/21 12:07 PM   Specimen: Nasopharyngeal Swab; Nasopharyngeal(NP) swabs in vial transport medium  Result Value Ref Range Status   SARS Coronavirus 2 by RT PCR NEGATIVE NEGATIVE Final    Comment: (NOTE) SARS-CoV-2 target nucleic acids are NOT DETECTED.  The SARS-CoV-2 RNA is generally detectable in upper respiratory specimens during the acute phase of infection. The lowest concentration of SARS-CoV-2 viral copies this assay can detect is 138 copies/mL. A negative result does not preclude SARS-Cov-2 infection and should not be used as the sole basis for treatment or other patient management decisions. A negative result may occur with  improper specimen collection/handling, submission of specimen other than nasopharyngeal swab, presence of viral mutation(s) within the areas targeted by this assay, and  inadequate number of viral copies(<138 copies/mL). A negative result must be combined with clinical observations, patient history, and epidemiological information. The expected result is Negative.  Fact Sheet for Patients:  EntrepreneurPulse.com.au  Fact Sheet for Healthcare Providers:  IncredibleEmployment.be  This test is no t yet approved or cleared by the  [place] [employer]ural [employer] and  has been authorized for detection and/or diagnosis of SARS-CoV-2 by FDA under an Print production planner (EUA). This EUA will remain  in effect (meaning this test can be used) for the duration of the COVID-19 declaration under Section 564(b)(1) of the Act, 21 U.S.C.section 360bbb-3(b)(1), unless the authorization is terminated  or revoked sooner.       Influenza A by PCR NEGATIVE NEGATIVE Final   Influenza B by PCR NEGATIVE NEGATIVE Final    Comment: (NOTE) The Xpert Xpress SARS-CoV-2/FLU/RSV plus assay is intended as an aid in the diagnosis of influenza from Nasopharyngeal swab specimens and should not be used as a sole basis for treatment. Nasal washings and aspirates are unacceptable for Xpert Xpress SARS-CoV-2/FLU/RSV testing.  Fact Sheet for Patients: EntrepreneurPulse.com.au  Fact Sheet for Healthcare Providers: IncredibleEmployment.be  This test is not yet approved or cleared by the [place] FDA and has been authorized for detection and/or diagnosis of SARS-CoV-2 by FDA under an Emergency Use Authorization (EUA). This EUA will remain in effect (meaning this test can be used) for the duration of the COVID-19 declaration under Section 564(b)(1) of the Act, 21 U.S.C. section 360bbb-3(b)(1), unless the authorization is terminated or revoked.  Performed at [facility], [address], [place], [place] [address]   Urine Culture     Status: Abnormal   Collection Time: 07/28/21 11:17 AM   Specimen:  Urine, Clean Catch  Result Value Ref Range Status   Specimen Description   Final    URINE, CLEAN CATCH Performed at [facility], [address], [place], [place] [address]    Special Requests   Final    NONE Performed at [facility], [address], [place], [place] [address]    Culture (A)  Final    <10,000 COLONIES/mL INSIGNIFICANT GROWTH Performed at [facility] [address], [place], [place] [address]    Report Status 07/29/2021 FINAL  Final         Radiology Studies: CT ABDOMEN PELVIS W WO CONTRAST  Result Date: 07/30/2021 CLINICAL DATA:  Hematuria and bacteremia. History of left ureteral reimplantation and stent placement 06/18/2021 EXAM: CT ABDOMEN AND PELVIS WITHOUT AND WITH CONTRAST TECHNIQUE: Multidetector CT imaging of the abdomen and pelvis was performed following the standard protocol before and following the bolus administration of intravenous contrast. CONTRAST:  174m OMNIPAQUE IOHEXOL 350 MG/ML SOLN COMPARISON:  CT scan 03/15/2021 FINDINGS: Lower chest: The lung bases are clear of an acute process. No infiltrates or edema. There is a small left pleural effusion. Small calcified plaque noted at the right lung base. Age advanced coronary artery calcifications are noted. Hepatobiliary: No hepatic lesions are identified without contrast. No intrahepatic biliary dilatation. The gallbladder is unremarkable. No common bile duct dilatation. Pancreas: No mass, inflammation or ductal dilatation. Spleen: Normal size.  No focal lesions. Adrenals/Urinary Tract: Adrenal glands are normal. The right kidney is unremarkable. No renal or obstructing ureteral calculi. The left kidney demonstrates a double-J ureteral stent with the proximal Cope loop in the renal pelvis and the distal Cope loop in the bladder. No hydroureteronephrosis. No renal or ureteral calculi are identified. No bladder mass or calculi. Postoperative changes related to the left ureteral  reimplantation and psoas hitch. No bladder wall thickening or bladder mass. Stomach/Bowel: The stomach, duodenum, small bowel and colon are grossly normal without oral contrast. No inflammatory changes, mass lesions or obstructive findings. The appendix is normal. Vascular/Lymphatic: Age advanced atherosclerotic calcifications but  no aneurysm. No mesenteric or retroperitoneal mass or adenopathy. Reproductive: The prostate gland and seminal vesicles are unremarkable. Extensive penile calcifications are noted. Other: No free abdominal or free pelvic fluid collections. No pelvic abscess. Musculoskeletal: No significant bony findings. Extensive surgical changes involving the lumbar spine with L2-S1 fusion. IMPRESSION: 1. Left-sided double-J ureteral stent in good position without complicating features. No hydroureteronephrosis. 2. No renal, ureteral or bladder calculi. 3. No acute abdominal/pelvic findings, mass lesions or adenopathy. 4. Age advanced atherosclerotic calcifications involving the aorta and coronary arteries. 5. Small left pleural effusion. Electronically Signed   By: Marijo Sanes M.D.   On: 07/30/2021 15:07        Scheduled Meds:  apixaban  5 mg Oral BID   diltiazem  180 mg Oral BID   fluticasone furoate-vilanterol  1 puff Inhalation Daily   And   umeclidinium bromide  1 puff Inhalation Daily   hydrochlorothiazide  12.5 mg Oral Daily   lisinopril  20 mg Oral QHS   loratadine  10 mg Oral Daily   metoprolol succinate  50 mg Oral Daily   montelukast  10 mg Oral QHS   pantoprazole  40 mg Oral QPM   predniSONE  10 mg Oral Q breakfast   senna-docusate  1 tablet Oral BID   tamsulosin  0.4 mg Oral Daily   Continuous Infusions:  sodium chloride 75 mL/hr at 07/31/21 0333   ceFEPime (MAXIPIME) IV 2 g (07/31/21 0500)     LOS: 4 days    Time spent: 20 mins     Wyvonnia Dusky, MD [facility] Pager [phone]  If 7PM-7AM, please contact night-coverage 07/31/2021, 8:03  AM

## 2021-08-01 DIAGNOSIS — R7881 Bacteremia: Secondary | ICD-10-CM | POA: Diagnosis not present

## 2021-08-01 DIAGNOSIS — E876 Hypokalemia: Secondary | ICD-10-CM | POA: Diagnosis not present

## 2021-08-01 DIAGNOSIS — N390 Urinary tract infection, site not specified: Secondary | ICD-10-CM | POA: Diagnosis not present

## 2021-08-01 LAB — BASIC METABOLIC PANEL
Anion gap: 7 (ref 5–15)
BUN: 19 mg/dL (ref 8–23)
CO2: 26 mmol/L (ref 22–32)
Calcium: 8.2 mg/dL — ABNORMAL LOW (ref 8.9–10.3)
Chloride: 105 mmol/L (ref 98–111)
Creatinine, Ser: 0.95 mg/dL (ref 0.61–1.24)
GFR, Estimated: >60 mL/min (ref >60)
Glucose, Bld: 131 mg/dL — ABNORMAL HIGH (ref 70–99)
Potassium: 3.1 mmol/L — ABNORMAL LOW (ref 3.5–5.1)
Sodium: 138 mmol/L (ref 135–145)

## 2021-08-01 LAB — CBC
HCT: 34.1 % — ABNORMAL LOW (ref 39.0–52.0)
Hemoglobin: 10.7 g/dL — ABNORMAL LOW (ref 13.0–17.0)
MCH: 24.2 pg — ABNORMAL LOW (ref 26.0–34.0)
MCHC: 31.4 g/dL (ref 30.0–36.0)
MCV: 77 fL — ABNORMAL LOW (ref 80.0–100.0)
Platelets: 268 10*3/uL (ref 150–400)
RBC: 4.43 MIL/uL (ref 4.22–5.81)
RDW: 16.9 % — ABNORMAL HIGH (ref 11.5–15.5)
WBC: 12.5 10*3/uL — ABNORMAL HIGH (ref 4.0–10.5)
nRBC: 0 % (ref 0.0–0.2)

## 2021-08-01 LAB — CULTURE, BLOOD (ROUTINE X 2)
Culture: NO GROWTH
Special Requests: ADEQUATE

## 2021-08-01 LAB — MAGNESIUM: Magnesium: 2.2 mg/dL (ref 1.7–2.4)

## 2021-08-01 MED ORDER — POTASSIUM CHLORIDE CRYS ER 20 MEQ PO TBCR
40.0000 meq | EXTENDED_RELEASE_TABLET | Freq: Once | ORAL | Status: AC
  Administered 2021-08-01: 40 meq via ORAL
  Filled 2021-08-01: qty 2

## 2021-08-01 MED ORDER — POTASSIUM CHLORIDE 20 MEQ PO PACK
40.0000 meq | PACK | Freq: Once | ORAL | Status: AC
  Administered 2021-08-01: 40 meq via ORAL
  Filled 2021-08-01: qty 2

## 2021-08-01 MED ORDER — POTASSIUM CHLORIDE CRYS ER 20 MEQ PO TBCR: 40.0000 meq | EXTENDED_RELEASE_TABLET | Freq: Two times a day (BID) | ORAL | Status: DC

## 2021-08-01 MED ORDER — SODIUM CHLORIDE 0.9 % IV SOLN
INTRAVENOUS | Status: DC | PRN
  Administered 2021-08-01: 1000 mL via INTRAVENOUS

## 2021-08-01 NOTE — Progress Notes (Addendum)
PROGRESS NOTE    [name]  LSL:[id number] DOB: 01-Feb-1956 DOA: 07/27/2021 PCP: Juline Patch, MD   Assessment & Plan:   Active Problems:   Reactive airway disease   Essential (primary) hypertension   Ureteral stricture   Sepsis secondary to UTI ([place])   Bacteremia due to Gram-negative bacteria   AF (paroxysmal atrial fibrillation) (HCC)   Sepsis: met criteria w/ lactic acidosis, leukocytosis, tachycardia, fever & likely UTI. Has ureteral stent in place. Continue on cipro. Sepsis has resolved    Bacteremia: blood cxs growing enterobacter cloacae. Continue on cipro. Highest femp 100.1. Repeat blood cxs NGTD < 24 hrs   UTI: urine cx shows insignificant growth. Continue on cipro. CT abd/pelvis showed ureteral stent in good position, no hydroureteronephrosis. Will get stent removed as an outpatient as per urology   Hypokalemia: KCl repleated. Mg is WNL   HIV neg: screening was positive but confirmatory was neg. False positive   Leukocytosis: labile. Will continue to monitor    HTN: continue on diltiazem    Reactive airway disease: continue on bronchodilators. Encourage incentive spirometry & flutter valve    PAF: continue on eliquis, diltiazem    Obesity: BMI 31.6. Complicates overall care & prognosis      DVT prophylaxis: eliquis  Code Status:  full  Family Communication: Disposition Plan: likely d/c back home   Level of care: Med-Surg  Status is: Inpatient  Remains inpatient appropriate because:IV treatments appropriate due to intensity of illness or inability to take PO and Inpatient level of care appropriate due to severity of illness  Dispo: The patient is from: Home              Anticipated d/c is to: Home              Patient currently is not medically stable to d/c.   Difficult to place patient : unclear        Consultants:  Urology: Dr. Erlene Quan   Procedures:  Antimicrobials: cipro    Subjective: Pt c/o malaise    Objective: Vitals:   07/31/21 1614 07/31/21 1931 08/01/21 0431 08/01/21 0449  BP: (!) 146/88 (!) 141/82 (!) 137/95 (!) 141/93  Pulse: 63 93 (!) 124 90  Resp: 12 18 20 18   Temp: 98 F (36.7 C) 98.2 F (36.8 C) 99.3 F (37.4 C) 99.6 F (37.6 C)  TempSrc: Oral Oral Oral Oral  SpO2: 94% 96% 94% 95%  Weight:      Height:        Intake/Output Summary (Last 24 hours) at 08/01/2021 0744 Last data filed at 07/31/2021 1911 Gross per 24 hour  Intake 1060 ml  Output 1100 ml  Net -40 ml   Filed Weights   07/27/21 1033  Weight: 108.9 kg    Examination:  General exam: Appears calm & comfortable  Respiratory system: diminished breath sounds b/l otherwise clear Cardiovascular system: irregularly irregular  Gastrointestinal system: Abd is soft, NT, obese & normal bowel sounds   Central nervous system: alert and oriented. Moves all extremities  Psychiatry: judgement and insight appear normal. Appropriate mood and affect     Data Reviewed: I have personally reviewed following labs and imaging studies  CBC: Recent Labs  Lab 07/27/21 1043 07/28/21 0431 07/29/21 0304 07/30/21 0449 07/31/21 0422 08/01/21 0444  WBC 15.4* 14.1* 12.7* 9.1 9.8 12.5*  NEUTROABS 13.7*  --   --   --   --   --   HGB 12.3* 11.5* 10.1* 10.5* 10.7* 10.7*  HCT 39.8 36.1* 32.0* 31.9* 33.4* 34.1*  MCV 78.7* 79.5* 78.2* 77.6* 77.3* 77.0*  PLT 199 165 172 185 213 979   Basic Metabolic Panel: Recent Labs  Lab 07/28/21 0431 07/29/21 0304 07/30/21 0449 07/31/21 0422 07/31/21 0830 08/01/21 0444  NA 139 138 137 134*  --  138  K 4.2 3.7 3.1* 2.9*  --  3.1*  CL 110 111 104 99  --  105  CO2 25 22 24 27   --  26  GLUCOSE 262* 182* 116* 136*  --  131*  BUN 21 32* 20 15  --  19  CREATININE 1.10 0.98 1.01 0.98  --  0.95  CALCIUM 8.5* 8.5* 8.6* 8.5*  --  8.2*  MG  --   --  1.8  --  1.8  --    GFR: Estimated Creatinine Clearance: 97.7 mL/min (by C-G formula based on SCr of 0.95 mg/dL). Liver Function  Tests: Recent Labs  Lab 07/27/21 1043  AST 23  ALT 24  ALKPHOS 73  BILITOT 1.1  PROT 7.5  ALBUMIN 3.5   No results for input(s): LIPASE, AMYLASE in the last 168 hours. No results for input(s): AMMONIA in the last 168 hours. Coagulation Profile: Recent Labs  Lab 07/27/21 1043 07/27/21 1354  INR 1.7* 2.2*   Cardiac Enzymes: No results for input(s): CKTOTAL, CKMB, CKMBINDEX, TROPONINI in the last 168 hours. BNP (last 3 results) No results for input(s): PROBNP in the last 8760 hours. HbA1C: No results for input(s): HGBA1C in the last 72 hours. CBG: No results for input(s): GLUCAP in the last 168 hours. Lipid Profile: No results for input(s): CHOL, HDL, LDLCALC, TRIG, CHOLHDL, LDLDIRECT in the last 72 hours. Thyroid Function Tests: No results for input(s): TSH, T4TOTAL, FREET4, T3FREE, THYROIDAB in the last 72 hours. Anemia Panel: No results for input(s): VITAMINB12, FOLATE, FERRITIN, TIBC, IRON, RETICCTPCT in the last 72 hours. Sepsis Labs: Recent Labs  Lab 07/27/21 1044 07/27/21 1354  LATICACIDVEN 2.5* 1.9    Recent Results (from the past 240 hour(s))  Culture, blood (Routine x 2)     Status: None   Collection Time: 07/27/21 10:44 AM   Specimen: BLOOD  Result Value Ref Range Status   Specimen Description BLOOD RIGHT [facility]  Final   Special Requests   Final    BOTTLES DRAWN AEROBIC AND ANAEROBIC Blood Culture adequate volume   Culture   Final    NO GROWTH 5 DAYS Performed at [facility], [address] [place], [place] [address]    Report Status 08/01/2021 FINAL  Final  Culture, blood (Routine x 2)     Status: Abnormal   Collection Time: 07/27/21 11:27 AM   Specimen: BLOOD  Result Value Ref Range Status   Specimen Description   Final    BLOOD BLOOD LEFT HAND Performed at [facility], [address] [place], [place] [address]    Special Requests   Final    BOTTLES DRAWN AEROBIC AND ANAEROBIC Blood Culture results may not be optimal due to  an inadequate volume of blood received in culture bottles Performed at [facility], [address], [place], [place] [address]    Culture  Setup Time   Final    GRAM NEGATIVE RODS BOTTLES DRAWN AEROBIC ONLY CRITICAL RESULT CALLED TO, READ BACK BY AND VERIFIED WITH: NATHAN BLUE@0621  07/29/21 RH Performed at [facility], [place] [address], [place], [place] [address]    Culture ENTEROBACTER CLOACAE (A)  Final  Report Status 07/31/2021 FINAL  Final   Organism ID, Bacteria ENTEROBACTER CLOACAE  Final      Susceptibility   Enterobacter cloacae - MIC*    CEFAZOLIN >=64 RESISTANT Resistant     CEFEPIME <=0.12 SENSITIVE Sensitive     CEFTAZIDIME <=1 SENSITIVE Sensitive     CIPROFLOXACIN <=0.25 SENSITIVE Sensitive     GENTAMICIN <=1 SENSITIVE Sensitive     IMIPENEM 0.5 SENSITIVE Sensitive     TRIMETH/SULFA <=20 SENSITIVE Sensitive     PIP/TAZO <=4 SENSITIVE Sensitive     * ENTEROBACTER CLOACAE  Blood Culture ID Panel (Reflexed)     Status: Abnormal   Collection Time: 07/27/21 11:27 AM  Result Value Ref Range Status   Enterococcus faecalis NOT DETECTED NOT DETECTED Final   Enterococcus Faecium NOT DETECTED NOT DETECTED Final   Listeria monocytogenes NOT DETECTED NOT DETECTED Final   Staphylococcus species NOT DETECTED NOT DETECTED Final   Staphylococcus aureus (BCID) NOT DETECTED NOT DETECTED Final   Staphylococcus epidermidis NOT DETECTED NOT DETECTED Final   Staphylococcus lugdunensis NOT DETECTED NOT DETECTED Final   Streptococcus species NOT DETECTED NOT DETECTED Final   Streptococcus agalactiae NOT DETECTED NOT DETECTED Final   Streptococcus pneumoniae NOT DETECTED NOT DETECTED Final   Streptococcus pyogenes NOT DETECTED NOT DETECTED Final   A.calcoaceticus-baumannii NOT DETECTED NOT DETECTED Final   Bacteroides fragilis NOT DETECTED NOT DETECTED Final   Enterobacterales DETECTED (A) NOT DETECTED Final    Comment: Enterobacterales represent a large order of gram  negative bacteria, not a single organism. CRITICAL RESULT CALLED TO, READ BACK BY AND VERIFIED WITH: NATHAN BLUE@0621  07/29/21 RH    Enterobacter cloacae complex DETECTED (A) NOT DETECTED Final    Comment: CRITICAL RESULT CALLED TO, READ BACK BY AND VERIFIED WITH: NATHAN BLUE@0621  07/29/21 RH    Escherichia coli NOT DETECTED NOT DETECTED Final   Klebsiella aerogenes NOT DETECTED NOT DETECTED Final   Klebsiella oxytoca NOT DETECTED NOT DETECTED Final   Klebsiella pneumoniae NOT DETECTED NOT DETECTED Final   Proteus species NOT DETECTED NOT DETECTED Final   Salmonella species NOT DETECTED NOT DETECTED Final   Serratia marcescens NOT DETECTED NOT DETECTED Final   Haemophilus influenzae NOT DETECTED NOT DETECTED Final   Neisseria meningitidis NOT DETECTED NOT DETECTED Final   Pseudomonas aeruginosa NOT DETECTED NOT DETECTED Final   Stenotrophomonas maltophilia NOT DETECTED NOT DETECTED Final   Candida albicans NOT DETECTED NOT DETECTED Final   Candida auris NOT DETECTED NOT DETECTED Final   Candida glabrata NOT DETECTED NOT DETECTED Final   Candida krusei NOT DETECTED NOT DETECTED Final   Candida parapsilosis NOT DETECTED NOT DETECTED Final   Candida tropicalis NOT DETECTED NOT DETECTED Final   Cryptococcus neoformans/gattii NOT DETECTED NOT DETECTED Final   CTX-M ESBL NOT DETECTED NOT DETECTED Final   Carbapenem resistance IMP NOT DETECTED NOT DETECTED Final   Carbapenem resistance KPC NOT DETECTED NOT DETECTED Final   Carbapenem resistance NDM NOT DETECTED NOT DETECTED Final   Carbapenem resist OXA 48 LIKE NOT DETECTED NOT DETECTED Final   Carbapenem resistance VIM NOT DETECTED NOT DETECTED Final    Comment: Performed at [facility], [address] [place], [place] [address]  Resp Panel by RT-PCR (Flu A&B, Covid) Nasopharyngeal Swab     Status: None   Collection Time: 07/27/21 12:07 PM   Specimen: Nasopharyngeal Swab; Nasopharyngeal(NP) swabs in vial transport medium   Result Value Ref Range Status   SARS Coronavirus 2 by RT PCR NEGATIVE NEGATIVE Final  Comment: (NOTE) SARS-CoV-2 target nucleic acids are NOT DETECTED.  The SARS-CoV-2 RNA is generally detectable in upper respiratory specimens during the acute phase of infection. The lowest concentration of SARS-CoV-2 viral copies this assay can detect is 138 copies/mL. A negative result does not preclude SARS-Cov-2 infection and should not be used as the sole basis for treatment or other patient management decisions. A negative result may occur with  improper specimen collection/handling, submission of specimen other than nasopharyngeal swab, presence of viral mutation(s) within the areas targeted by this assay, and inadequate number of viral copies(<138 copies/mL). A negative result must be combined with clinical observations, patient history, and epidemiological information. The expected result is Negative.  Fact Sheet for Patients:  EntrepreneurPulse.com.au  Fact Sheet for Healthcare Providers:  IncredibleEmployment.be  This test is no t yet approved or cleared by the [place] FDA and  has been authorized for detection and/or diagnosis of SARS-CoV-2 by FDA under an Emergency Use Authorization (EUA). This EUA will remain  in effect (meaning this test can be used) for the duration of the COVID-19 declaration under Section 564(b)(1) of the Act, 21 U.S.C.section 360bbb-3(b)(1), unless the authorization is terminated  or revoked sooner.       Influenza A by PCR NEGATIVE NEGATIVE Final   Influenza B by PCR NEGATIVE NEGATIVE Final    Comment: (NOTE) The Xpert Xpress SARS-CoV-2/FLU/RSV plus assay is intended as an aid in the diagnosis of influenza from Nasopharyngeal swab specimens and should not be used as a sole basis for treatment. Nasal washings and aspirates are unacceptable for Xpert Xpress SARS-CoV-2/FLU/RSV testing.  Fact Sheet for  Patients: EntrepreneurPulse.com.au  Fact Sheet for Healthcare Providers: IncredibleEmployment.be  This test is not yet approved or cleared by the [place] FDA and has been authorized for detection and/or diagnosis of SARS-CoV-2 by FDA under an Emergency Use Authorization (EUA). This EUA will remain in effect (meaning this test can be used) for the duration of the COVID-19 declaration under Section 564(b)(1) of the Act, 21 U.S.C. section 360bbb-3(b)(1), unless the authorization is terminated or revoked.  Performed at [facility], [address] [place], [place] [address]   Urine Culture     Status: Abnormal   Collection Time: 07/28/21 11:17 AM   Specimen: Urine, Clean Catch  Result Value Ref Range Status   Specimen Description   Final    URINE, CLEAN CATCH Performed at [facility], [address], [place], [place] [address]    Special Requests   Final    NONE Performed at [facility], [address] [place], [place] [address]    Culture (A)  Final    <10,000 COLONIES/mL INSIGNIFICANT GROWTH Performed at [facility] [address] [place], [place] [address]    Report Status 07/29/2021 FINAL  Final  CULTURE, BLOOD (ROUTINE X 2) w Reflex to ID Panel     Status: None (Preliminary result)   Collection Time: 07/31/21 10:13 AM   Specimen: BLOOD  Result Value Ref Range Status   Specimen Description BLOOD L ARM  Final   Special Requests   Final    BOTTLES DRAWN AEROBIC AND ANAEROBIC Blood Culture adequate volume   Culture   Final    NO GROWTH < 24 HOURS Performed at [facility], [address] [place], [place] [address]    Report Status PENDING  Incomplete  CULTURE, BLOOD (ROUTINE X 2) w Reflex to ID Panel     Status: None (Preliminary result)  Collection Time: 07/31/21 10:13 AM   Specimen: BLOOD  Result Value Ref Range Status   Specimen Description BLOOD BLOOD RIGHT HAND  Final    Special Requests   Final    BOTTLES DRAWN AEROBIC AND ANAEROBIC Blood Culture adequate volume   Culture   Final    NO GROWTH < 24 HOURS Performed at [facility], [address], [place], [place] [address]    Report Status PENDING  Incomplete         Radiology Studies: CT ABDOMEN PELVIS W WO CONTRAST  Result Date: 07/30/2021 CLINICAL DATA:  Hematuria and bacteremia. History of left ureteral reimplantation and stent placement 06/18/2021 EXAM: CT ABDOMEN AND PELVIS WITHOUT AND WITH CONTRAST TECHNIQUE: Multidetector CT imaging of the abdomen and pelvis was performed following the standard protocol before and following the bolus administration of intravenous contrast. CONTRAST:  127m OMNIPAQUE IOHEXOL 350 MG/ML SOLN COMPARISON:  CT scan 03/15/2021 FINDINGS: Lower chest: The lung bases are clear of an acute process. No infiltrates or edema. There is a small left pleural effusion. Small calcified plaque noted at the right lung base. Age advanced coronary artery calcifications are noted. Hepatobiliary: No hepatic lesions are identified without contrast. No intrahepatic biliary dilatation. The gallbladder is unremarkable. No common bile duct dilatation. Pancreas: No mass, inflammation or ductal dilatation. Spleen: Normal size.  No focal lesions. Adrenals/Urinary Tract: Adrenal glands are normal. The right kidney is unremarkable. No renal or obstructing ureteral calculi. The left kidney demonstrates a double-J ureteral stent with the proximal Cope loop in the renal pelvis and the distal Cope loop in the bladder. No hydroureteronephrosis. No renal or ureteral calculi are identified. No bladder mass or calculi. 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 No bladder wall thickening or bladder mass. Stomach/Bowel: The stomach, duodenum, small bowel and colon are grossly normal without oral contrast. No inflammatory changes, mass lesions or obstructive findings.  The appendix is normal. Vascular/Lymphatic: Age advanced atherosclerotic calcifications but no aneurysm. No mesenteric or retroperitoneal mass or adenopathy. Reproductive: The prostate gland and seminal vesicles are unremarkable. Extensive penile calcifications are noted. Other: No free abdominal or free pelvic fluid collections. No pelvic abscess. Musculoskeletal: No significant bony findings. Extensive surgical changes involving the lumbar spine with L2-S1 fusion. IMPRESSION: 1. Left-sided double-J ureteral stent in good position without complicating features. No hydroureteronephrosis. 2. No renal, ureteral or bladder calculi. 3. No acute abdominal/pelvic findings, mass lesions or adenopathy. 4. Age advanced atherosclerotic calcifications involving the aorta and coronary arteries. 5. Small left pleural effusion. Electronically Signed   By: PMarijo SanesM.D.   On: 07/30/2021 15:07        Scheduled Meds:  apixaban  5 mg Oral BID   diltiazem  180 mg Oral BID   fluticasone furoate-vilanterol  1 puff Inhalation Daily   And   umeclidinium bromide  1 puff Inhalation Daily   hydrochlorothiazide  12.5 mg Oral Daily   lisinopril  20 mg Oral QHS   loratadine  10 mg Oral Daily   metoprolol succinate  50 mg Oral Daily   montelukast  10 mg Oral QHS   pantoprazole  40 mg Oral QPM   predniSONE  10 mg Oral Q breakfast   senna-docusate  1 tablet Oral BID   tamsulosin  0.4 mg Oral Daily   Continuous Infusions:  ciprofloxacin 400 mg (07/31/21 2221)     LOS: 5 days    Time spent: 15 mins     Nadalie Laughner M  Jimmye Norman, MD [facility] Pager [phone]  If 7PM-7AM, please contact night-coverage 08/01/2021, 7:44 AM

## 2021-08-02 DIAGNOSIS — R7881 Bacteremia: Secondary | ICD-10-CM | POA: Diagnosis not present

## 2021-08-02 DIAGNOSIS — I480 Paroxysmal atrial fibrillation: Secondary | ICD-10-CM | POA: Diagnosis not present

## 2021-08-02 DIAGNOSIS — N390 Urinary tract infection, site not specified: Secondary | ICD-10-CM | POA: Diagnosis not present

## 2021-08-02 LAB — CBC
HCT: 32.9 % — ABNORMAL LOW (ref 39.0–52.0)
Hemoglobin: 10.5 g/dL — ABNORMAL LOW (ref 13.0–17.0)
MCH: 25.3 pg — ABNORMAL LOW (ref 26.0–34.0)
MCHC: 31.9 g/dL (ref 30.0–36.0)
MCV: 79.3 fL — ABNORMAL LOW (ref 80.0–100.0)
Platelets: 295 10*3/uL (ref 150–400)
RBC: 4.15 MIL/uL — ABNORMAL LOW (ref 4.22–5.81)
RDW: 17.2 % — ABNORMAL HIGH (ref 11.5–15.5)
WBC: 9.7 10*3/uL (ref 4.0–10.5)
nRBC: 0 % (ref 0.0–0.2)

## 2021-08-02 LAB — BASIC METABOLIC PANEL
Anion gap: 8 (ref 5–15)
BUN: 17 mg/dL (ref 8–23)
CO2: 24 mmol/L (ref 22–32)
Calcium: 8.2 mg/dL — ABNORMAL LOW (ref 8.9–10.3)
Chloride: 106 mmol/L (ref 98–111)
Creatinine, Ser: 0.95 mg/dL (ref 0.61–1.24)
GFR, Estimated: >60 mL/min (ref >60)
Glucose, Bld: 121 mg/dL — ABNORMAL HIGH (ref 70–99)
Potassium: 3.3 mmol/L — ABNORMAL LOW (ref 3.5–5.1)
Sodium: 138 mmol/L (ref 135–145)

## 2021-08-02 LAB — MAGNESIUM: Magnesium: 2 mg/dL (ref 1.7–2.4)

## 2021-08-02 MED ORDER — POTASSIUM CHLORIDE CRYS ER 20 MEQ PO TBCR
40.0000 meq | EXTENDED_RELEASE_TABLET | Freq: Once | ORAL | Status: AC
  Administered 2021-08-02: 40 meq via ORAL
  Filled 2021-08-02: qty 2

## 2021-08-02 MED ORDER — CIPROFLOXACIN HCL 500 MG PO TABS: 500.0000 mg | ORAL_TABLET | Freq: Two times a day (BID) | ORAL | 0 refills | Status: AC

## 2021-08-02 NOTE — Discharge Summary (Signed)
Physician Discharge Summary  [name] CNO:[id number] DOB: [birth year] DOA: 07/27/2021  PCP: Juline Patch, MD  Admit date: 07/27/2021 Discharge date: 08/02/2021  Admitted From: home  Disposition:  home  Recommendations for Outpatient Follow-up:  Follow up with PCP in 1-2 weeks F/u w/ urology, Dr. Tresa Moore, w/in 1 week   Home Health: no Equipment/Devices:  Discharge Condition: stable  CODE STATUS: full  Diet recommendation: Heart Healthy  Brief/Interim Summary: HPI was taken from Dr. Linda Hedges: [name] is a 65 y.o. male with medical history significant of paroxysmal A. fib, hypertension, kidney stones status post left-sided stenting 06/18/2021 presenting to PCP office with 7-10 day history of concomitant shortness of air, productive cough, and bilateral flank pain with blood per urine and recent cloudy urine. He has been having fevers, sweats, and significant fatigue. Of note he just recently completed course of abx for bronchitis.   On examination in PCP office he is febrile at 101.3, HR 121, respirations 24, saturation at 93%, and he is laying supine in mild distress secondary to effort, he is diaphoretic. Examination findings additionally pertinent for coarse lower lung fields, regular rhythm with tachycardic rate, mild suprapubic tenderness, CVA tenderness right greater than left.   Given that he has 3 SIRS criteria with 2 suspected sources of infection (lungs, kidney/bladder) PCP advised the patient about concern for sepsis and what setting (outpatient vs inpatient) this condition is best managed. They are amenable to non-emergent transport to [facility] ER    ED Course: Tmax 101.3  118/74  HRmax 121 to 102  RR 18. Lab revealed K 3.4, glucose 182, Cr 1.12, WBC 15.4 with 90/5/4. U/A w/ many bacteria, >50 WBC/hpf, 21-50 RBC/hpf. CXR- NAD. EKG Sinus tachycardia. Code sepsis was activated: patient received 1 L bolus NS then maintenance fluids, Cefipime 2g give IV, blood and  urine specimens sent for culture. TRH called ot admit for continued management.  Hospital course from Dr. Jimmye Norman 10/5-10/10/22: Pt presented w/ sepsis secondary bacteremia likely secondary to UTI & ureteral stent. Blood cxs were positive for enterobacter cloacae and pt was initially placed on IV cefepime and then switched to cipro after cx were finalized. Pt will go home w/ po cipro to complete 2 week course total of abxs. Repeat blood cxs were NGTD x 2 days at time of d/c. Pt will see urology, Dr. Tresa Moore, outpatient to get his stent removed. Pt verbalized his understanding. For more information, please see previous progress/consult notes.   Discharge Diagnoses:  Active Problems:   Reactive airway disease   Essential (primary) hypertension   Ureteral stricture   Sepsis secondary to UTI (Impact)   Bacteremia due to Gram-negative bacteria   AF (paroxysmal atrial fibrillation) (HCC)  Sepsis: met criteria w/ lactic acidosis, leukocytosis, tachycardia, fever & likely UTI. Has ureteral stent in place. Continue on cipro. Sepsis has resolved    Bacteremia: blood cxs growing enterobacter cloacae. Continue on cipro to complete 14 day course total. Afebrile x 24 hours. Repeat blood cxs NGTD    UTI: urine cx grew insignificant growth. Continue on cipro. CT abd/pelvis showed ureteral stent in good position, no hydroureteronephrosis. Will get stent removed as an outpatient as per urology    Hypokalemia: potassium was given    HIV neg: screening was positive but confirmatory was neg. False positive    Leukocytosis: labile. Will continue to monitor    HTN: continue on diltiazem    Reactive airway disease: continue on bronchodilators. Encourage incentive spirometry & flutter valve  PAF: continue on eliquis, diltiazem     Obesity: BMI 31.6. Complicates overall care & prognosis    Discharge Instructions  Discharge Instructions     Diet - low sodium heart healthy   Complete by: As directed     Discharge instructions   Complete by: As directed    F/u w/ PCP in 1-2 weeks. F/u urology, Dr. Tresa Moore, w/in 1 week   Increase activity slowly   Complete by: As directed       Allergies as of 08/02/2021       Reactions   Meloxicam Palpitations        Medication List     STOP taking these medications    doxycycline 100 MG tablet Commonly known as: VIBRA-TABS       TAKE these medications    albuterol 108 (90 Base) MCG/ACT inhaler Commonly known as: VENTOLIN HFA Inhale 2 puffs into the lungs every 6 (six) hours as needed for wheezing or shortness of breath.   apixaban 5 MG Tabs tablet Commonly known as: ELIQUIS Take 5 mg by mouth 2 (two) times daily.   AZO URINARY PAIN PO Take 1 capsule by mouth daily.   ciprofloxacin 500 MG tablet Commonly known as: Cipro Take 1 tablet (500 mg total) by mouth 2 (two) times daily for 8 days.   diltiazem 180 MG 24 hr capsule Commonly known as: CARDIZEM CD Take 1 capsule by mouth twice daily   docusate sodium 100 MG capsule Commonly known as: COLACE Take 1 capsule (100 mg total) by mouth 2 (two) times daily.   fexofenadine 180 MG tablet Commonly known as: ALLEGRA Take 1 tablet (180 mg total) by mouth daily.   fluticasone 50 MCG/ACT nasal spray Commonly known as: FLONASE Use 1 spray(s) in each nostril once daily   hydrochlorothiazide 12.5 MG tablet Commonly known as: HYDRODIURIL Take 1 tablet (12.5 mg total) by mouth daily.   HYDROcodone-acetaminophen 5-325 MG tablet Commonly known as: Norco Take 1-2 tablets by mouth every 6 (six) hours as needed for moderate pain.   lisinopril 20 MG tablet Commonly known as: ZESTRIL Take 1 tablet (20 mg total) by mouth every evening. What changed: when to take this   metoprolol succinate 25 MG 24 hr tablet Commonly known as: TOPROL-XL Take 1 tablet (25 mg total) by mouth daily.   montelukast 10 MG tablet Commonly known as: SINGULAIR Take 1 tablet (10 mg total) by mouth at  bedtime.   Olopatadine HCl 0.2 % Soln Place 1 drop into both eyes 2 (two) times daily as needed (allergies.).   pantoprazole 40 MG tablet Commonly known as: PROTONIX Take 1 tablet (40 mg total) by mouth daily. What changed: when to take this   potassium chloride SA 20 MEQ tablet Commonly known as: KLOR-CON Take 3 tablets (60 mEq total) by mouth daily. What changed:  how much to take when to take this additional instructions   predniSONE 10 MG tablet Commonly known as: DELTASONE Take 1 tablet (10 mg total) by mouth daily with breakfast.   senna-docusate 8.6-50 MG tablet Commonly known as: Senokot-S Take 1 tablet by mouth 2 (two) times daily. While taking strong pain meds to prevent constipation   tadalafil 5 MG tablet Commonly known as: CIALIS Take 1-2 tablets (5-10 mg total) by mouth daily as needed for erectile dysfunction.   tamsulosin 0.4 MG Caps capsule Commonly known as: FLOMAX TAKE ONE (1) CAPSULE EACH DAY. What changed: See the new instructions.   Trelegy Ellipta 100-62.5-25 MCG/INH  Aepb Generic drug: Fluticasone-Umeclidin-Vilant Inhale 1 puff into the lungs daily.        Follow-up Information     Alexis Frock, MD. Go on 08/11/2021.   Specialty: Urology Why: [place] information: [place] [address] [phone]         Juline Patch, MD. Go on 11/11/2021.   Specialty: Family Medicine Why: 2:20pm appointment Contact information: [place] [address] [phone]                Allergies  Allergen Reactions   Meloxicam Palpitations    Consultations: Urology    Procedures/Studies: CT ABDOMEN PELVIS W WO CONTRAST  Result Date: 07/30/2021 CLINICAL DATA:  Hematuria and bacteremia. History of left ureteral reimplantation and stent placement 06/18/2021 EXAM: CT ABDOMEN AND PELVIS WITHOUT AND WITH CONTRAST TECHNIQUE: Multidetector CT imaging of the abdomen and pelvis was performed  following the standard protocol before and following the bolus administration of intravenous contrast. CONTRAST:  144m OMNIPAQUE IOHEXOL 350 MG/ML SOLN COMPARISON:  CT scan 03/15/2021 FINDINGS: Lower chest: The lung bases are clear of an acute process. No infiltrates or edema. There is a small left pleural effusion. Small calcified plaque noted at the right lung base. Age advanced coronary artery calcifications are noted. Hepatobiliary: No hepatic lesions are identified without contrast. No intrahepatic biliary dilatation. The gallbladder is unremarkable. No common bile duct dilatation. Pancreas: No mass, inflammation or ductal dilatation. Spleen: Normal size.  No focal lesions. Adrenals/Urinary Tract: Adrenal glands are normal. The right kidney is unremarkable. No renal or obstructing ureteral calculi. The left kidney demonstrates a double-J ureteral stent with the proximal Cope loop in the renal pelvis and the distal Cope loop in the bladder. No hydroureteronephrosis. No renal or ureteral calculi are identified. No bladder mass or calculi. 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 No bladder wall thickening or bladder mass. Stomach/Bowel: The stomach, duodenum, small bowel and colon are grossly normal without oral contrast. No inflammatory changes, mass lesions or obstructive findings. The appendix is normal. Vascular/Lymphatic: Age advanced atherosclerotic calcifications but no aneurysm. No mesenteric or retroperitoneal mass or adenopathy. Reproductive: The prostate gland and seminal vesicles are unremarkable. Extensive penile calcifications are noted. Other: No free abdominal or free pelvic fluid collections. No pelvic abscess. Musculoskeletal: No significant bony findings. Extensive surgical changes involving the lumbar spine with L2-S1 fusion. IMPRESSION: 1. Left-sided double-J ureteral stent in good position without complicating features. No hydroureteronephrosis. 2.  No renal, ureteral or bladder calculi. 3. No acute abdominal/pelvic findings, mass lesions or adenopathy. 4. Age advanced atherosclerotic calcifications involving the aorta and coronary arteries. 5. Small left pleural effusion. Electronically Signed   By: PMarijo SanesM.D.   On: 07/30/2021 15:07   DG Chest 2 View  Result Date: 07/27/2021 CLINICAL DATA:  Pt here from his doctor's office with possible sepsis. Pt had a fever at home but took tylenol at 0800. Pt states that he feels weak and is nauseous. Pt also states bilateral flank painSuspected Sepsis EXAM: CHEST - 2 VIEW COMPARISON:  07/05/2021 FINDINGS: Normal mediastinum and cardiac silhouette. Normal pulmonary vasculature. No evidence of effusion, infiltrate, or pneumothorax. No acute bony abnormality. IMPRESSION: No acute cardiopulmonary process. Electronically Signed   By: SSuzy BouchardM.D.   On: 07/27/2021 11:27   DG Chest 2 View  Result Date: 07/05/2021 CLINICAL DATA:  65year old male with cough EXAM: CHEST - 2 VIEW COMPARISON:  12/03/2018 FINDINGS: Cardiomediastinal silhouette unchanged  in size and contour. No evidence of central vascular congestion. No interlobular septal thickening. No pneumothorax or pleural effusion. Coarsened interstitial markings, with no confluent airspace disease. No acute displaced fracture. Degenerative changes of the spine. IMPRESSION: No active cardiopulmonary disease. Electronically Signed   By: Corrie Mckusick D.O.   On: 07/05/2021 14:08   DG Abd 1 View  Result Date: 07/28/2021 CLINICAL DATA:  Ureteral stent. EXAM: ABDOMEN - 1 VIEW COMPARISON:  Mar 15, 2021. FINDINGS: The bowel gas pattern is normal. Left-sided ureteral stent is noted. Postsurgical changes are seen involving the lumbar spine. IMPRESSION: Left-sided ureteral stent is noted. No abnormal bowel dilatation is noted. Electronically Signed   By: Marijo Conception M.D.   On: 07/28/2021 18:09   (Echo, Carotid, EGD, Colonoscopy, ERCP)    Subjective: Pt  denies any complaints    Discharge Exam: Vitals:   08/02/21 1035 08/02/21 1204  BP:  (!) 126/98  Pulse: 72 94  Resp:  18  Temp:  98 F (36.7 C)  SpO2:  99%   Vitals:   08/02/21 0506 08/02/21 0754 08/02/21 1035 08/02/21 1204  BP: (!) 134/115 (!) 151/113  (!) 126/98  Pulse: 62 (!) 53 72 94  Resp: _0 Temp: 98.6 F (37 C) 98.5 F (36.9 C)  98 F (36.7 C)  TempSrc:  Oral  Oral  SpO2: 96% 96%  99%  Weight:      Height:        General: Pt is alert, awake, not in acute distress Cardiovascular:S1/S2 +, no rubs, no gallops Respiratory: CTA bilaterally, no wheezing, no rales  Abdominal: Soft, NT, obese, bowel sounds + Extremities:  no cyanosis    The results of significant diagnostics from this hospitalization (including imaging, microbiology, ancillary and laboratory) are listed below for reference.     Microbiology: Recent Results (from the past 240 hour(s))  Culture, blood (Routine x 2)     Status: None   Collection Time: 07/27/21 10:44 AM   Specimen: BLOOD  Result Value Ref Range Status   Specimen Description BLOOD RIGHT [facility]  Final   Special Requests   Final    BOTTLES DRAWN AEROBIC AND ANAEROBIC Blood Culture adequate volume   Culture   Final    NO GROWTH 5 DAYS Performed at [facility], [address] [place], [place] [address]    Report Status 08/01/2021 FINAL  Final  Culture, blood (Routine x 2)     Status: Abnormal   Collection Time: 07/27/21 11:27 AM   Specimen: BLOOD  Result Value Ref Range Status   Specimen Description   Final    BLOOD BLOOD LEFT HAND Performed at [facility], [address], [place], [place] [address]    Special Requests   Final    BOTTLES DRAWN AEROBIC AND ANAEROBIC Blood Culture results may not be optimal due to an inadequate volume of blood received in culture bottles Performed at [facility], [address], [place],  [address]    Culture  Setup Time   Final    GRAM NEGATIVE  RODS BOTTLES DRAWN AEROBIC ONLY CRITICAL RESULT CALLED TO, READ BACK BY AND VERIFIED WITH: NATHAN [email or web address]  07/29/21 RH Performed at [facility], [place] [address] [place],  [address]    Culture ENTEROBACTER CLOACAE (A)  Final   Report Status 07/31/2021 FINAL  Final   Organism ID, Bacteria ENTEROBACTER CLOACAE  Final      Susceptibility   Enterobacter cloacae -  MIC*    CEFAZOLIN >=64 RESISTANT Resistant     CEFEPIME <=0.12 SENSITIVE Sensitive     CEFTAZIDIME <=1 SENSITIVE Sensitive     CIPROFLOXACIN <=0.25 SENSITIVE Sensitive     GENTAMICIN <=1 SENSITIVE Sensitive     IMIPENEM 0.5 SENSITIVE Sensitive     TRIMETH/SULFA <=20 SENSITIVE Sensitive     PIP/TAZO <=4 SENSITIVE Sensitive     * ENTEROBACTER CLOACAE  Blood Culture ID Panel (Reflexed)     Status: Abnormal   Collection Time: 07/27/21 11:27 AM  Result Value Ref Range Status   Enterococcus faecalis NOT DETECTED NOT DETECTED Final   Enterococcus Faecium NOT DETECTED NOT DETECTED Final   Listeria monocytogenes NOT DETECTED NOT DETECTED Final   Staphylococcus species NOT DETECTED NOT DETECTED Final   Staphylococcus aureus (BCID) NOT DETECTED NOT DETECTED Final   Staphylococcus epidermidis NOT DETECTED NOT DETECTED Final   Staphylococcus lugdunensis NOT DETECTED NOT DETECTED Final   Streptococcus species NOT DETECTED NOT DETECTED Final   Streptococcus agalactiae NOT DETECTED NOT DETECTED Final   Streptococcus pneumoniae NOT DETECTED NOT DETECTED Final   Streptococcus pyogenes NOT DETECTED NOT DETECTED Final   A.calcoaceticus-baumannii NOT DETECTED NOT DETECTED Final   Bacteroides fragilis NOT DETECTED NOT DETECTED Final   Enterobacterales DETECTED (A) NOT DETECTED Final    Comment: Enterobacterales represent a large order of gram negative bacteria, not a single organism. CRITICAL RESULT CALLED TO, READ BACK BY AND VERIFIED WITH: NATHAN BLUE_[email or web address]  07/29/21 RH    Enterobacter cloacae complex DETECTED (A) NOT DETECTED  Final    Comment: CRITICAL RESULT CALLED TO, READ BACK BY AND VERIFIED WITH: NATHAN BLUE_1  07/29/21 RH    Escherichia coli NOT DETECTED NOT DETECTED Final   Klebsiella aerogenes NOT DETECTED NOT DETECTED Final   Klebsiella oxytoca NOT DETECTED NOT DETECTED Final   Klebsiella pneumoniae NOT DETECTED NOT DETECTED Final   Proteus species NOT DETECTED NOT DETECTED Final   Salmonella species NOT DETECTED NOT DETECTED Final   Serratia marcescens NOT DETECTED NOT DETECTED Final   Haemophilus influenzae NOT DETECTED NOT DETECTED Final   Neisseria meningitidis NOT DETECTED NOT DETECTED Final   Pseudomonas aeruginosa NOT DETECTED NOT DETECTED Final   Stenotrophomonas maltophilia NOT DETECTED NOT DETECTED Final   Candida albicans NOT DETECTED NOT DETECTED Final   Candida auris NOT DETECTED NOT DETECTED Final   Candida glabrata NOT DETECTED NOT DETECTED Final   Candida krusei NOT DETECTED NOT DETECTED Final   Candida parapsilosis NOT DETECTED NOT DETECTED Final   Candida tropicalis NOT DETECTED NOT DETECTED Final   Cryptococcus neoformans/gattii NOT DETECTED NOT DETECTED Final   CTX-M ESBL NOT DETECTED NOT DETECTED Final   Carbapenem resistance IMP NOT DETECTED NOT DETECTED Final   Carbapenem resistance KPC NOT DETECTED NOT DETECTED Final   Carbapenem resistance NDM NOT DETECTED NOT DETECTED Final   Carbapenem resist OXA 48 LIKE NOT DETECTED NOT DETECTED Final   Carbapenem resistance VIM NOT DETECTED NOT DETECTED Final    Comment: Performed at [facility], [address] [place], [place] [address]  Resp Panel by RT-PCR (Flu A&B, Covid) Nasopharyngeal Swab     Status: None   Collection Time: 07/27/21 12:07 PM   Specimen: Nasopharyngeal Swab; Nasopharyngeal(NP) swabs in vial transport medium  Result Value Ref Range Status   SARS Coronavirus 2 by RT PCR NEGATIVE NEGATIVE Final    Comment: (NOTE) SARS-CoV-2 target nucleic acids are NOT DETECTED.  The SARS-CoV-2 RNA is generally  detectable in upper respiratory specimens during the acute  phase of infection. The lowest concentration of SARS-CoV-2 viral copies this assay can detect is 138 copies/mL. A negative result does not preclude SARS-Cov-2 infection and should not be used as the sole basis for treatment or other patient management decisions. A negative result may occur with  improper specimen collection/handling, submission of specimen other than nasopharyngeal swab, presence of viral mutation(s) within the areas targeted by this assay, and inadequate number of viral copies(<138 copies/mL). A negative result must be combined with clinical observations, patient history, and epidemiological information. The expected result is Negative.  Fact Sheet for Patients:  EntrepreneurPulse.com.au  Fact Sheet for Healthcare Providers:  IncredibleEmployment.be  This test is no t yet approved or cleared by the [place] FDA and  has been authorized for detection and/or diagnosis of SARS-CoV-2 by FDA under an Emergency Use Authorization (EUA). This EUA will remain  in effect (meaning this test can be used) for the duration of the COVID-19 declaration under Section 564(b)(1) of the Act, 21 U.S.C.section 360bbb-3(b)(1), unless the authorization is terminated  or revoked sooner.       Influenza A by PCR NEGATIVE NEGATIVE Final   Influenza B by PCR NEGATIVE NEGATIVE Final    Comment: (NOTE) The Xpert Xpress SARS-CoV-2/FLU/RSV plus assay is intended as an aid in the diagnosis of influenza from Nasopharyngeal swab specimens and should not be used as a sole basis for treatment. Nasal washings and aspirates are unacceptable for Xpert Xpress SARS-CoV-2/FLU/RSV testing.  Fact Sheet for Patients: EntrepreneurPulse.com.au  Fact Sheet for Healthcare Providers: IncredibleEmployment.be  This test is not yet approved or cleared by the [place] FDA  and has been authorized for detection and/or diagnosis of SARS-CoV-2 by FDA under an Emergency Use Authorization (EUA). This EUA will remain in effect (meaning this test can be used) for the duration of the COVID-19 declaration under Section 564(b)(1) of the Act, 21 U.S.C. section 360bbb-3(b)(1), unless the authorization is terminated or revoked.  Performed at [facility], [address] [place] [address], [place], [place] [address]   Urine Culture     Status: Abnormal   Collection Time: 07/28/21 11:17 AM   Specimen: Urine, Clean Catch  Result Value Ref Range Status   Specimen Description   Final    URINE, CLEAN CATCH Performed at [facility], [address], [place], [place] [address]    Special Requests   Final    NONE Performed at [facility], [address], [place], [place] [address]    Culture (A)  Final    <10,000 COLONIES/mL INSIGNIFICANT GROWTH Performed at [facility] [address] [place], [place] [address]    Report Status 07/29/2021 FINAL  Final  CULTURE, BLOOD (ROUTINE X 2) w Reflex to ID Panel     Status: None (Preliminary result)   Collection Time: 07/31/21 10:13 AM   Specimen: BLOOD  Result Value Ref Range Status   Specimen Description BLOOD L ARM  Final   Special Requests   Final    BOTTLES DRAWN AEROBIC AND ANAEROBIC Blood Culture adequate volume   Culture   Final    NO GROWTH 2 DAYS Performed at [facility], [address] [place], [place] [address]    Report Status PENDING  Incomplete  CULTURE, BLOOD (ROUTINE X 2) w Reflex to ID Panel     Status: None (Preliminary result)   Collection Time: 07/31/21 10:13 AM   Specimen: BLOOD  Result Value Ref Range Status   Specimen Description BLOOD BLOOD RIGHT  HAND  Final   Special Requests   Final    BOTTLES DRAWN AEROBIC AND ANAEROBIC Blood Culture adequate volume   Culture   Final    NO GROWTH 2 DAYS Performed at [facility], [address] [place],  [place] [address]    Report Status PENDING  Incomplete     Labs: BNP (last 3 results) No results for input(s): BNP in the last 8760 hours. Basic Metabolic Panel: Recent Labs  Lab 07/29/21 0304 07/30/21 0449 07/31/21 0422 07/31/21 0830 08/01/21 0444 08/01/21 0750 08/02/21 0421  NA 138 137 134*  --  138  --  138  K 3.7 3.1* 2.9*  --  3.1*  --  3.3*  CL 111 104 99  --  105  --  106  CO2 _0 --  26  --  24  GLUCOSE 182* 116* 136*  --  131*  --  121*  BUN 32* 20 15  --  19  --  17  CREATININE 0.98 1.01 0.98  --  0.95  --  0.95  CALCIUM 8.5* 8.6* 8.5*  --  8.2*  --  8.2*  MG  --  1.8  --  1.8  --  2.2 2.0   Liver Function Tests: Recent Labs  Lab 07/27/21 1043  AST 23  ALT 24  ALKPHOS 73  BILITOT 1.1  PROT 7.5  ALBUMIN 3.5   No results for input(s): LIPASE, AMYLASE in the last 168 hours. No results for input(s): AMMONIA in the last 168 hours. CBC: Recent Labs  Lab 07/27/21 1043 07/28/21 0431 07/29/21 0304 07/30/21 0449 07/31/21 0422 08/01/21 0444 08/02/21 0421  WBC 15.4*   < > 12.7* 9.1 9.8 12.5* 9.7  NEUTROABS 13.7*  --   --   --   --   --   --   HGB 12.3*   < > 10.1* 10.5* 10.7* 10.7* 10.5*  HCT 39.8   < > 32.0* 31.9* 33.4* 34.1* 32.9*  MCV 78.7*   < > 78.2* 77.6* 77.3* 77.0* 79.3*  PLT 199   < > 172 185 213 268 295   < > = values in this interval not displayed.   Cardiac Enzymes: No results for input(s): CKTOTAL, CKMB, CKMBINDEX, TROPONINI in the last 168 hours. BNP: Invalid input(s): POCBNP CBG: No results for input(s): GLUCAP in the last 168 hours. D-Dimer No results for input(s): DDIMER in the last 72 hours. Hgb A1c No results for input(s): HGBA1C in the last 72 hours. Lipid Profile No results for input(s): CHOL, HDL, LDLCALC, TRIG, CHOLHDL, LDLDIRECT in the last 72 hours. Thyroid function studies No results for input(s): TSH, T4TOTAL, T3FREE, THYROIDAB in the last 72 hours.  Invalid input(s): FREET3 Anemia work up No results for input(s):  VITAMINB12, FOLATE, FERRITIN, TIBC, IRON, RETICCTPCT in the last 72 hours. Urinalysis    Component Value Date/Time   COLORURINE AMBER (A) 07/27/2021 1044   APPEARANCEUR CLOUDY (A) 07/27/2021 1044   APPEARANCEUR Clear 06/05/2019 1117   LABSPEC 1.020 07/27/2021 1044   PHURINE 6.0 07/27/2021 1044   GLUCOSEU NEGATIVE 07/27/2021 1044   HGBUR MODERATE (A) 07/27/2021 1044   BILIRUBINUR NEGATIVE 07/27/2021 1044   BILIRUBINUR negative 07/02/2021 1024   BILIRUBINUR Negative 06/05/2019 1117   KETONESUR NEGATIVE 07/27/2021 1044   PROTEINUR 100 (A) 07/27/2021 1044   UROBILINOGEN negative (A) 07/02/2021 1024   NITRITE POSITIVE (A) 07/27/2021 1044   LEUKOCYTESUR LARGE (A) 07/27/2021 1044   Sepsis Labs Invalid input(s): PROCALCITONIN,  WBC,  LACTICIDVEN Microbiology Recent Results (from the past 240 hour(s))  Culture, blood (Routine x 2)     Status: None   Collection Time: 07/27/21 10:44 AM   Specimen: BLOOD  Result Value Ref Range Status   Specimen Description BLOOD RIGHT [facility]  Final   Special Requests   Final    BOTTLES DRAWN AEROBIC AND ANAEROBIC Blood Culture adequate volume   Culture   Final    NO GROWTH 5 DAYS Performed at [facility], [address] [place], [place] [address]    Report Status 08/01/2021 FINAL  Final  Culture, blood (Routine x 2)     Status: Abnormal   Collection Time: 07/27/21 11:27 AM   Specimen: BLOOD  Result Value Ref Range Status   Specimen Description   Final    BLOOD BLOOD LEFT HAND Performed at [facility], [address] [place], [place] [address]    Special Requests   Final    BOTTLES DRAWN AEROBIC AND ANAEROBIC Blood Culture results may not be optimal due to an inadequate volume of blood received in culture bottles Performed at [facility], [address], [place], [place] [address]    Culture  Setup Time   Final    GRAM NEGATIVE RODS BOTTLES DRAWN AEROBIC ONLY CRITICAL RESULT CALLED TO, READ BACK BY AND VERIFIED  WITH: NATHAN [email or web address]_[email or web address]  07/29/21 RH Performed at [facility], [place] [address], [place], [place] [address]    Culture ENTEROBACTER CLOACAE (A)  Final   Report Status 07/31/2021 FINAL  Final   Organism ID, Bacteria ENTEROBACTER CLOACAE  Final      Susceptibility   Enterobacter cloacae - MIC*    CEFAZOLIN >=64 RESISTANT Resistant     CEFEPIME <=0.12 SENSITIVE Sensitive     CEFTAZIDIME <=1 SENSITIVE Sensitive     CIPROFLOXACIN <=0.25 SENSITIVE Sensitive     GENTAMICIN <=1 SENSITIVE Sensitive     IMIPENEM 0.5 SENSITIVE Sensitive     TRIMETH/SULFA <=20 SENSITIVE Sensitive     PIP/TAZO <=4 SENSITIVE Sensitive     * ENTEROBACTER CLOACAE  Blood Culture ID Panel (Reflexed)     Status: Abnormal   Collection Time: 07/27/21 11:27 AM  Result Value Ref Range Status   Enterococcus faecalis NOT DETECTED NOT DETECTED Final   Enterococcus Faecium NOT DETECTED NOT DETECTED Final   Listeria monocytogenes NOT DETECTED NOT DETECTED Final   Staphylococcus species NOT DETECTED NOT DETECTED Final   Staphylococcus aureus (BCID) NOT DETECTED NOT DETECTED Final   Staphylococcus epidermidis NOT DETECTED NOT DETECTED Final   Staphylococcus lugdunensis NOT DETECTED NOT DETECTED Final   Streptococcus species NOT DETECTED NOT DETECTED Final   Streptococcus agalactiae NOT DETECTED NOT DETECTED Final   Streptococcus pneumoniae NOT DETECTED NOT DETECTED Final   Streptococcus pyogenes NOT DETECTED NOT DETECTED Final   A.calcoaceticus-baumannii NOT DETECTED NOT DETECTED Final   Bacteroides fragilis NOT DETECTED NOT DETECTED Final   Enterobacterales DETECTED (A) NOT DETECTED Final    Comment: Enterobacterales represent a large order of gram negative bacteria, not a single organism. CRITICAL RESULT CALLED TO, READ BACK BY AND VERIFIED WITH: NATHAN [email or web address]  07/29/21 RH    Enterobacter cloacae complex DETECTED (A) NOT DETECTED Final    Comment: CRITICAL RESULT CALLED TO, READ BACK BY AND VERIFIED WITH: NATHAN  BLUE_[email or web address]  07/29/21 RH    Escherichia coli NOT DETECTED NOT DETECTED Final   Klebsiella aerogenes NOT DETECTED NOT DETECTED Final   Klebsiella oxytoca NOT DETECTED NOT DETECTED Final  Klebsiella pneumoniae NOT DETECTED NOT DETECTED Final   Proteus species NOT DETECTED NOT DETECTED Final   Salmonella species NOT DETECTED NOT DETECTED Final   Serratia marcescens NOT DETECTED NOT DETECTED Final   Haemophilus influenzae NOT DETECTED NOT DETECTED Final   Neisseria meningitidis NOT DETECTED NOT DETECTED Final   Pseudomonas aeruginosa NOT DETECTED NOT DETECTED Final   Stenotrophomonas maltophilia NOT DETECTED NOT DETECTED Final   Candida albicans NOT DETECTED NOT DETECTED Final   Candida auris NOT DETECTED NOT DETECTED Final   Candida glabrata NOT DETECTED NOT DETECTED Final   Candida krusei NOT DETECTED NOT DETECTED Final   Candida parapsilosis NOT DETECTED NOT DETECTED Final   Candida tropicalis NOT DETECTED NOT DETECTED Final   Cryptococcus neoformans/gattii NOT DETECTED NOT DETECTED Final   CTX-M ESBL NOT DETECTED NOT DETECTED Final   Carbapenem resistance IMP NOT DETECTED NOT DETECTED Final   Carbapenem resistance KPC NOT DETECTED NOT DETECTED Final   Carbapenem resistance NDM NOT DETECTED NOT DETECTED Final   Carbapenem resist OXA 48 LIKE NOT DETECTED NOT DETECTED Final   Carbapenem resistance VIM NOT DETECTED NOT DETECTED Final    Comment: Performed at [facility], [address] [place], [place] [address]  Resp Panel by RT-PCR (Flu A&B, Covid) Nasopharyngeal Swab     Status: None   Collection Time: 07/27/21 12:07 PM   Specimen: Nasopharyngeal Swab; Nasopharyngeal(NP) swabs in vial transport medium  Result Value Ref Range Status   SARS Coronavirus 2 by RT PCR NEGATIVE NEGATIVE Final    Comment: (NOTE) SARS-CoV-2 target nucleic acids are NOT DETECTED.  The SARS-CoV-2 RNA is generally detectable in upper respiratory specimens during the acute phase of infection. The  lowest concentration of SARS-CoV-2 viral copies this assay can detect is 138 copies/mL. A negative result does not preclude SARS-Cov-2 infection and should not be used as the sole basis for treatment or other patient management decisions. A negative result may occur with  improper specimen collection/handling, submission of specimen other than nasopharyngeal swab, presence of viral mutation(s) within the areas targeted by this assay, and inadequate number of viral copies(<138 copies/mL). A negative result must be combined with clinical observations, patient history, and epidemiological information. The expected result is Negative.  Fact Sheet for Patients:  EntrepreneurPulse.com.au  Fact Sheet for Healthcare Providers:  IncredibleEmployment.be  This test is no t yet approved or cleared by the [place] FDA and  has been authorized for detection and/or diagnosis of SARS-CoV-2 by FDA under an Emergency Use Authorization (EUA). This EUA will remain  in effect (meaning this test can be used) for the duration of the COVID-19 declaration under Section 564(b)(1) of the Act, 21 U.S.C.section 360bbb-3(b)(1), unless the authorization is terminated  or revoked sooner.       Influenza A by PCR NEGATIVE NEGATIVE Final   Influenza B by PCR NEGATIVE NEGATIVE Final    Comment: (NOTE) The Xpert Xpress SARS-CoV-2/FLU/RSV plus assay is intended as an aid in the diagnosis of influenza from Nasopharyngeal swab specimens and should not be used as a sole basis for treatment. Nasal washings and aspirates are unacceptable for Xpert Xpress SARS-CoV-2/FLU/RSV testing.  Fact Sheet for Patients: EntrepreneurPulse.com.au  Fact Sheet for Healthcare Providers: IncredibleEmployment.be  This test is not yet approved or cleared by the [place] FDA and has been authorized for detection and/or diagnosis of SARS-CoV-2 by FDA under  an Emergency Use Authorization (EUA). This EUA will remain in effect (meaning this test can be used) for the duration  of the COVID-19 declaration under Section 564(b)(1) of the Act, 21 U.S.C. section 360bbb-3(b)(1), unless the authorization is terminated or revoked.  Performed at [facility], [address], [place], [place] [address]   Urine Culture     Status: Abnormal   Collection Time: 07/28/21 11:17 AM   Specimen: Urine, Clean Catch  Result Value Ref Range Status   Specimen Description   Final    URINE, CLEAN CATCH Performed at [facility], [address] [place], [place] [address]    Special Requests   Final    NONE Performed at [facility], [address] [place], [place] [address]    Culture (A)  Final    <10,000 COLONIES/mL INSIGNIFICANT GROWTH Performed at [facility] [address], [place], [place] [address]    Report Status 07/29/2021 FINAL  Final  CULTURE, BLOOD (ROUTINE X 2) w Reflex to ID Panel     Status: None (Preliminary result)   Collection Time: 07/31/21 10:13 AM   Specimen: BLOOD  Result Value Ref Range Status   Specimen Description BLOOD L ARM  Final   Special Requests   Final    BOTTLES DRAWN AEROBIC AND ANAEROBIC Blood Culture adequate volume   Culture   Final    NO GROWTH 2 DAYS Performed at [facility], [address] [place], [place] [address]    Report Status PENDING  Incomplete  CULTURE, BLOOD (ROUTINE X 2) w Reflex to ID Panel     Status: None (Preliminary result)   Collection Time: 07/31/21 10:13 AM   Specimen: BLOOD  Result Value Ref Range Status   Specimen Description BLOOD BLOOD RIGHT HAND  Final   Special Requests   Final    BOTTLES DRAWN AEROBIC AND ANAEROBIC Blood Culture adequate volume   Culture   Final    NO GROWTH 2 DAYS Performed at [facility], [address] [place], [place] [address]    Report Status PENDING  Incomplete     Time coordinating discharge:  Over 30 minutes  SIGNED:   Wyvonnia Dusky, MD  [facility] 08/02/2021, 1:41 PM Pager   If 7PM-7AM, please contact night-coverage

## 2021-08-03 ENCOUNTER — Telehealth: Payer: Self-pay

## 2021-08-03 NOTE — Telephone Encounter (Signed)
Copied from [place] [id number]. Topic: Appointment Scheduling - Scheduling Inquiry for Clinic >> Aug 03, 2021  2:43 PM Wynetta Emery, [place] C wrote: Reason for CRM: pt called in to schedule his hospital follow up. Pt says that he was discharged yesterday. Pt says that he was advised to followup with PCP within 7-10 days. Pt would like to know if possible could he have his hospital follow up tomorrow or Thursday if possible?   Please assist pt further.

## 2021-08-03 NOTE — Telephone Encounter (Signed)
Pt called back to see if there was any news on him getting a sooner Hosp follow up/ he wanted to see DR. Jones before leaving town on Friday/ advised pt that the Nurse has his message and will contact him about [facility] f/u

## 2021-08-03 NOTE — Telephone Encounter (Signed)
Transition Care Management Follow-up Telephone Call Date of discharge and from where: 08/03/21 [facility] How have you been since you were released from the hospital?  Pt states he is feeling better but still weak Any questions or concerns? No  Items Reviewed: Did the pt receive and understand the discharge instructions provided? Yes  Medications obtained and verified? Yes  Other? No  Any new allergies since your discharge? No  Dietary orders reviewed? Yes Do you have support at home? Yes   Home Care and Equipment/Supplies: Were home health services ordered? no Were any new equipment or medical supplies ordered?  No  Functional Questionnaire: (I = Independent and D = Dependent) ADLs: I  Bathing/Dressing- I  Meal Prep- I  Eating- I  Maintaining continence- I  Transferring/Ambulation- I  Managing Meds- I  Follow up appointments reviewed:  PCP Hospital f/u appt confirmed? Yes  Scheduled to see Dr. Ronnald Ramp in January; needs earlier appt.  [facility] f/u appt confirmed? Yes  Scheduled to see Dr. Diamantina Providence on 08/11/21; pt states he will have wil reschedule due to being out of town Are transportation arrangements needed? No  If their condition worsens, is the pt aware to call PCP or go to the Emergency Dept.? Yes Was the patient provided with contact information for the PCP's office or ED? Yes Was to pt encouraged to call back with questions or concerns? Yes

## 2021-08-05 LAB — CULTURE, BLOOD (ROUTINE X 2)
Culture: NO GROWTH
Culture: NO GROWTH
Special Requests: ADEQUATE
Special Requests: ADEQUATE

## 2021-08-11 ENCOUNTER — Ambulatory Visit: Payer: Medicare PPO | Admitting: Urology

## 2021-08-12 NOTE — Hospital Course (Signed)
False + HIV test 

## 2021-08-16 DIAGNOSIS — N131 Hydronephrosis with ureteral stricture, not elsewhere classified: Secondary | ICD-10-CM | POA: Diagnosis not present

## 2021-08-24 ENCOUNTER — Ambulatory Visit: Payer: Medicare PPO | Admitting: Family Medicine

## 2021-08-24 ENCOUNTER — Other Ambulatory Visit: Payer: Self-pay

## 2021-08-24 ENCOUNTER — Encounter: Payer: Self-pay | Admitting: Family Medicine

## 2021-08-24 VITALS — BP 118/80 | HR 80 | Ht 73.0 in | Wt 244.0 lb

## 2021-08-24 DIAGNOSIS — K219 Gastro-esophageal reflux disease without esophagitis: Secondary | ICD-10-CM

## 2021-08-24 DIAGNOSIS — I38 Endocarditis, valve unspecified: Secondary | ICD-10-CM

## 2021-08-24 DIAGNOSIS — I10 Essential (primary) hypertension: Secondary | ICD-10-CM

## 2021-08-24 DIAGNOSIS — Z23 Encounter for immunization: Secondary | ICD-10-CM | POA: Diagnosis not present

## 2021-08-24 DIAGNOSIS — D649 Anemia, unspecified: Secondary | ICD-10-CM

## 2021-08-24 DIAGNOSIS — E876 Hypokalemia: Secondary | ICD-10-CM | POA: Diagnosis not present

## 2021-08-24 DIAGNOSIS — J302 Other seasonal allergic rhinitis: Secondary | ICD-10-CM

## 2021-08-24 DIAGNOSIS — R601 Generalized edema: Secondary | ICD-10-CM

## 2021-08-24 MED ORDER — POTASSIUM CHLORIDE CRYS ER 20 MEQ PO TBCR: 20.0000 meq | EXTENDED_RELEASE_TABLET | Freq: Two times a day (BID) | ORAL | 3 refills | Status: DC

## 2021-08-24 MED ORDER — LISINOPRIL 20 MG PO TABS: 20.0000 mg | ORAL_TABLET | Freq: Every day | ORAL | 1 refills | Status: DC

## 2021-08-24 MED ORDER — PANTOPRAZOLE SODIUM 40 MG PO TBEC: 40.0000 mg | DELAYED_RELEASE_TABLET | Freq: Every evening | ORAL | 1 refills | Status: DC

## 2021-08-24 MED ORDER — FLUTICASONE PROPIONATE 50 MCG/ACT NA SUSP: NASAL | 11 refills | Status: DC

## 2021-08-24 MED ORDER — HYDROCHLOROTHIAZIDE 12.5 MG PO TABS: 12.5000 mg | ORAL_TABLET | Freq: Every day | ORAL | 1 refills | Status: DC

## 2021-08-24 MED ORDER — DILTIAZEM HCL ER COATED BEADS 180 MG PO CP24: 180.0000 mg | ORAL_CAPSULE | Freq: Two times a day (BID) | ORAL | 1 refills | Status: DC

## 2021-08-24 MED ORDER — MONTELUKAST SODIUM 10 MG PO TABS: 10.0000 mg | ORAL_TABLET | Freq: Every day | ORAL | 1 refills | Status: DC

## 2021-08-24 MED ORDER — FEXOFENADINE HCL 180 MG PO TABS: 180.0000 mg | ORAL_TABLET | Freq: Every day | ORAL | 1 refills | Status: DC

## 2021-08-24 MED ORDER — METOPROLOL SUCCINATE ER 25 MG PO TB24: 25.0000 mg | ORAL_TABLET | Freq: Every day | ORAL | 3 refills | Status: DC

## 2021-08-24 NOTE — Progress Notes (Signed)
Date:  08/24/2021   Name:  [name]   DOB:  01-08-1956   MRN:  [id number]   Chief Complaint: Follow-up (Sepsis- stent was removed last week- feeling better) and Flu Vaccine  Hypertension This is a chronic problem. The current episode started more than 1 year ago. The problem has been gradually improving since onset. The problem is controlled. Pertinent negatives include no anxiety, blurred vision, chest pain, headaches, neck pain, orthopnea, palpitations, PND or shortness of breath. Past treatments include diuretics, calcium channel blockers, ACE inhibitors and beta blockers. The current treatment provides moderate improvement. There are no compliance problems.  There is no history of angina, kidney disease, CAD/MI, CVA, heart failure, left ventricular hypertrophy, PVD or retinopathy. There is no history of chronic renal disease, a hypertension causing med or renovascular disease.  Gastroesophageal Reflux He complains of coughing. He reports no abdominal pain, no chest pain, no nausea, no sore throat or no wheezing.  URI  Chronicity: for allergies. The current episode started more than 1 year ago. The problem has been waxing and waning (controlled). Associated symptoms include congestion, coughing and sneezing. Pertinent negatives include no abdominal pain, chest pain, diarrhea, dysuria, ear pain, headaches, nausea, neck pain, rash, sore throat or wheezing. He has tried antihistamine for the symptoms.   Lab Results  Component Value Date   CREATININE 0.95 08/02/2021   BUN 17 08/02/2021   NA 138 08/02/2021   K 3.3 (L) 08/02/2021   CL 106 08/02/2021   CO2 24 08/02/2021   Lab Results  Component Value Date   CHOL 180 03/04/2021   HDL 46 03/04/2021   LDLCALC 119 (H) 03/04/2021   TRIG 80 03/04/2021   CHOLHDL 4.1 08/07/2019   No results found for: TSH No results found for: HGBA1C Lab Results  Component Value Date   WBC 9.7 08/02/2021   HGB 10.5 (L) 08/02/2021   HCT 32.9 (L)  08/02/2021   MCV 79.3 (L) 08/02/2021   PLT 295 08/02/2021   Lab Results  Component Value Date   ALT 24 07/27/2021   AST 23 07/27/2021   ALKPHOS 73 07/27/2021   BILITOT 1.1 07/27/2021     Review of Systems  Constitutional:  Negative for chills and fever.  HENT:  Positive for congestion and sneezing. Negative for drooling, ear discharge, ear pain and sore throat.   Eyes:  Negative for blurred vision.  Respiratory:  Positive for cough. Negative for shortness of breath and wheezing.   Cardiovascular:  Negative for chest pain, palpitations, orthopnea, leg swelling and PND.  Gastrointestinal:  Negative for abdominal pain, blood in stool, constipation, diarrhea and nausea.  Endocrine: Negative for polydipsia.  Genitourinary:  Negative for dysuria, frequency, hematuria and urgency.  Musculoskeletal:  Negative for back pain, myalgias and neck pain.  Skin:  Negative for rash.  Allergic/Immunologic: Negative for environmental allergies.  Neurological:  Negative for dizziness and headaches.  Hematological:  Does not bruise/bleed easily.  Psychiatric/Behavioral:  Negative for suicidal ideas. The patient is not nervous/anxious.    Patient Active Problem List   Diagnosis Date Noted   Bacteremia due to Gram-negative bacteria    AF (paroxysmal atrial fibrillation) (HCC)    SIRS (systemic inflammatory response syndrome) ([place]) 07/27/2021   Sepsis secondary to UTI ([place]) 07/27/2021   Ureteral stricture 06/18/2021   Sacroiliitis ([place]) 05/07/2021   Spinal stenosis of lumbosacral region 01/18/2021   Herniated nucleus pulposus, lumbar 03/16/2020   Body mass index (BMI) 33.0-33.9, adult 09/03/2019   Other  intervertebral disc displacement, lumbar region 06/25/2019   HNP (herniated nucleus pulposus), lumbar 06/12/2019   UTI due to Klebsiella species 05/08/2019   Biceps tendonitis on right 07/27/2018   Spinal stenosis, 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   Lumbago with sciatica,  unspecified side 11/14/2017   Primary osteoarthritis of right knee 10/12/2017   Taking multiple medications for chronic disease 07/30/2015   Bronchitis 07/01/2015   Reactive airway disease 07/01/2015   Allergic sinusitis 07/01/2015   Essential (primary) hypertension 07/01/2015   Valvular heart disease 07/01/2015   Metatarsalgia of left foot 06/10/2015   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    Allergies  Allergen Reactions   Meloxicam Palpitations    Past Surgical History:  Procedure Laterality Date   ABDOMINAL EXPOSURE N/A 01/18/2021   Procedure: ABDOMINAL EXPOSURE;  Surgeon: [name], MD;  Location: [place];  Service: Vascular;  Laterality: N/A;   ANTERIOR LUMBAR FUSION N/A 01/18/2021   Procedure: Anterior Lumbar Interbody Fusion - Lumbar Five-Sarcal One;  Surgeon: Kary Kos, MD;  Location: [place];  Service: Neurosurgery;  Laterality: N/A;  Anterior Lumbar Interbody Fusion - Lumbar Five-Sacral One   CYSTOSCOPY W/ URETERAL STENT PLACEMENT Left 06/18/2021   Procedure: CYSTOSCOPY WITH RETROGRADE PYELOGRAM/URETERAL STENT EXCHANGE;  Surgeon: Alexis Frock, MD;  Location: [facility];  Service: Urology;  Laterality: Left;   CYSTOSCOPY WITH RETROGRADE PYELOGRAM, URETEROSCOPY AND STENT PLACEMENT Left 04/21/2021   Procedure: CYSTOSCOPY WITH RETROGRADE PYELOGRAM, DIAGNOSTICURETEROSCOPY AND STENT PLACEMENT;  Surgeon: Alexis Frock, MD;  Location: [facility];  Service: Urology;  Laterality: Left;  1 HR   EXTRACORPOREAL SHOCK WAVE LITHOTRIPSY     approx 2002   LUMBAR [place] SURGERY  2018   REPAIR DURAL / CSF LEAK  2018   post op lumbar diskectomy   REVISION TOTAL KNEE ARTHROPLASTY Left 2015   TOTAL KNEE ARTHROPLASTY Left 2012   TRANSFORAMINAL LUMBAR INTERBODY FUSION (TLIF) WITH PEDICLE SCREW FIXATION 1 LEVEL N/A 06/12/2019   Procedure: LUMBAR FOUR-FIVE TRANSFORAMINAL LUMBAR INTERBODY FUSION (TLIF) WITH PEDICLE SCREW FIXATION;  Surgeon: Kary Kos, MD;  Location: [place];  Service:  Neurosurgery;  Laterality: N/A;   UMBILICAL HERNIA REPAIR  1992    Social History   Tobacco Use   Smoking status: Never   Smokeless tobacco: Former    Types: Chew    Quit date: 03/2020  Vaping Use   Vaping Use: Never used  Substance Use Topics   Alcohol use: Yes    Comment: ocassional   Drug use: Never     Medication list has been reviewed and updated.  Current Meds  Medication Sig   albuterol (VENTOLIN HFA) 108 (90 Base) MCG/ACT inhaler Inhale 2 puffs into the lungs every 6 (six) hours as needed for wheezing or shortness of breath.   apixaban (ELIQUIS) 5 MG TABS tablet Take 5 mg by mouth 2 (two) times daily.   diltiazem (CARDIZEM CD) 180 MG 24 hr capsule Take 1 capsule by mouth twice daily   fexofenadine (ALLEGRA) 180 MG tablet Take 1 tablet (180 mg total) by mouth daily.   fluticasone (FLONASE) 50 MCG/ACT nasal spray Use 1 spray(s) in each nostril once daily   Fluticasone-Umeclidin-Vilant (TRELEGY ELLIPTA) 100-62.5-25 MCG/INH AEPB Inhale 1 puff into the lungs daily.   hydrochlorothiazide (HYDRODIURIL) 12.5 MG tablet Take 1 tablet (12.5 mg total) by mouth daily.   lisinopril (ZESTRIL) 20 MG tablet Take 1 tablet (20 mg total) by mouth every evening. (Patient taking differently: Take 20 mg by mouth at bedtime.)  metoprolol succinate (TOPROL-XL) 25 MG 24 hr tablet Take 1 tablet (25 mg total) by mouth daily.   montelukast (SINGULAIR) 10 MG tablet Take 1 tablet (10 mg total) by mouth at bedtime.   Olopatadine HCl 0.2 % SOLN Place 1 drop into both eyes 2 (two) times daily as needed (allergies.).   pantoprazole (PROTONIX) 40 MG tablet Take 1 tablet (40 mg total) by mouth daily. (Patient taking differently: Take 40 mg by mouth every evening.)   potassium chloride SA (KLOR-CON) 20 MEQ tablet Take 3 tablets (60 mEq total) by mouth daily. (Patient taking differently: Take 20-40 mEq by mouth 2 (two) times daily. Takes one tablet in am and two tabs in pm)   tadalafil (CIALIS) 5 MG tablet  Take 1-2 tablets (5-10 mg total) by mouth daily as needed for erectile dysfunction.   tamsulosin (FLOMAX) 0.4 MG CAPS capsule TAKE ONE (1) CAPSULE EACH DAY. (Patient taking differently: Take 0.4 mg by mouth daily.)   [DISCONTINUED] docusate sodium (COLACE) 100 MG capsule Take 1 capsule (100 mg total) by mouth 2 (two) times daily.   [DISCONTINUED] Phenazopyridine HCl (AZO URINARY PAIN PO) Take 1 capsule by mouth daily.    PHQ 2/9 Scores 07/27/2021 07/02/2021 06/15/2021 03/04/2021  PHQ - 2 Score 0 0 0 0  PHQ- 9 Score 0 0 0 0    GAD 7 : Generalized Anxiety Score 07/27/2021 07/02/2021 06/15/2021 03/04/2021  Nervous, Anxious, on Edge 0 0 0 0  Control/stop worrying 0 0 0 0  Worry too much - different things 0 0 0 0  Trouble relaxing 0 0 0 0  Restless 0 0 0 0  Easily annoyed or irritable 0 0 0 0  Afraid - awful might happen 0 0 0 0  Total GAD 7 Score 0 0 0 0  Anxiety Difficulty Not difficult at all - - -    BP Readings from Last 3 Encounters:  08/24/21 118/80  08/02/21 (!) 126/98  07/27/21 118/74    Physical Exam Vitals and nursing note reviewed.  HENT:     Head: Normocephalic.     Right Ear: Tympanic membrane, ear canal and external ear normal.     Left Ear: Tympanic membrane, ear canal and external ear normal.     Nose: Nose normal.  Eyes:     General: No scleral icterus.       Right eye: No discharge.        Left eye: No discharge.     Conjunctiva/sclera: Conjunctivae normal.     Pupils: Pupils are equal, round, and reactive to light.  Neck:     Thyroid: No thyromegaly.     Vascular: No JVD.     Trachea: No tracheal deviation.  Cardiovascular:     Rate and Rhythm: Normal rate and regular rhythm.     Heart sounds: Normal heart sounds. No murmur heard.   No friction rub. No gallop.  Pulmonary:     Effort: No respiratory distress.     Breath sounds: Normal breath sounds. No wheezing, rhonchi or rales.  Abdominal:     General: Bowel sounds are normal.     Palpations: Abdomen is  soft. There is no mass.     Tenderness: There is no abdominal tenderness. There is no guarding or rebound.  Musculoskeletal:        General: No tenderness. Normal range of motion.     Cervical back: Normal range of motion and neck supple.  Lymphadenopathy:     Cervical: No  cervical adenopathy.  Skin:    General: Skin is warm.     Findings: No rash.  Neurological:     Mental Status: He is alert and oriented to person, place, and time.     Cranial Nerves: No cranial nerve deficit.     Deep Tendon Reflexes: Reflexes are normal and symmetric.    Wt Readings from Last 3 Encounters:  08/24/21 244 lb (110.7 kg)  07/27/21 240 lb (108.9 kg)  07/27/21 243 lb (110.2 kg)    BP 118/80   Pulse 80   Ht 6\' 1"  (1.854 m)   Wt 244 lb (110.7 kg)   BMI 32.19 kg/m   Assessment and Plan:  1. Essential hypertension Chronic.  Controlled.  Stable.  Blood pressure today is 118/80.  Continue following medications metoprolol XL 25 once a day, lisinopril 20 mg 1 nightly, diltiazem 180 mg twice a day, hydrochlorothiazide when needed milligrams once a day. - metoprolol succinate (TOPROL-XL) 25 MG 24 hr tablet; Take 1 tablet (25 mg total) by mouth daily.  Dispense: 30 tablet; Refill: 3 - lisinopril (ZESTRIL) 20 MG tablet; Take 1 tablet (20 mg total) by mouth at bedtime.  Dispense: 90 tablet; Refill: 1 - diltiazem (CARDIZEM CD) 180 MG 24 hr capsule; Take 1 capsule (180 mg total) by mouth 2 (two) times daily.  Dispense: 180 capsule; Refill: 1 - hydrochlorothiazide (HYDRODIURIL) 12.5 MG tablet; Take 1 tablet (12.5 mg total) by mouth daily.  Dispense: 90 tablet; Refill: 1 - Renal Function Panel  2. Generalized edema Chronic.  Controlled.  Stable.  Continue hydrochlorothiazide 12.5 mg once a day. - hydrochlorothiazide (HYDRODIURIL) 12.5 MG tablet; Take 1 tablet (12.5 mg total) by mouth daily.  Dispense: 90 tablet; Refill: 1  3. Valvular heart disease As noted above - hydrochlorothiazide (HYDRODIURIL) 12.5 MG  tablet; Take 1 tablet (12.5 mg total) by mouth daily.  Dispense: 90 tablet; Refill: 1  4. Gastroesophageal reflux disease, unspecified whether esophagitis present Chronic.  Controlled.  Stable.  Last reading of potassium was 3.1.  We will continue on potassium chloride supplementation 1 to 2 tablets as needed. - pantoprazole (PROTONIX) 40 MG tablet; Take 1 tablet (40 mg total) by mouth every evening.  Dispense: 90 tablet; Refill: 1  5. Hypokalemia Chronic.  Controlled.  Stable.  Continue pantoprazole 40 mg once a day. - potassium chloride SA (KLOR-CON) 20 MEQ tablet; Take 1-2 tablets (20-40 mEq total) by mouth 2 (two) times daily. Takes one tablet in am and two tabs in pm  Dispense: 270 tablet; Refill: 3 - Renal Function Panel  6. Other seasonal allergic rhinitis Chronic.  Controlled.  Stable.  Continue Flonase 50 mcg 1 sprays each nostril daily and fexofenadine 180 mg 1 tablet daily. - fluticasone (FLONASE) 50 MCG/ACT nasal spray; 2 spray q day  Dispense: 16 g; Refill: 11 - fexofenadine (ALLEGRA) 180 MG tablet; Take 1 tablet (180 mg total) by mouth daily.  Dispense: 90 tablet; Refill: 1  7. Seasonal allergic rhinitis, unspecified trigger As noted above - montelukast (SINGULAIR) 10 MG tablet; Take 1 tablet (10 mg total) by mouth at bedtime.  Dispense: 90 tablet; Refill: 1  8. Anemia, unspecified type Patient had anemia presumably from surgery it is microcytic and seen to be iron deficient secondary to perhaps blood loss.  We will recheck a CBC if improving if not we will check ferritin and possibly add iron supplementation. - CBC with Differential/Platelet  9. Need for immunization against influenza Discussed and administered - Flu Vaccine QUAD  High Dose(Fluad)

## 2021-08-25 LAB — CBC WITH DIFFERENTIAL/PLATELET
Basophils Absolute: 0 10*3/uL (ref 0.0–0.2)
Basos: 1 %
EOS (ABSOLUTE): 0.1 10*3/uL (ref 0.0–0.4)
Eos: 2 %
Hematocrit: 37.3 % — ABNORMAL LOW (ref 37.5–51.0)
Hemoglobin: 11.6 g/dL — ABNORMAL LOW (ref 13.0–17.7)
Immature Grans (Abs): 0 10*3/uL (ref 0.0–0.1)
Immature Granulocytes: 1 %
Lymphocytes Absolute: 1.3 10*3/uL (ref 0.7–3.1)
Lymphs: 20 %
MCH: 23.7 pg — ABNORMAL LOW (ref 26.6–33.0)
MCHC: 31.1 g/dL — ABNORMAL LOW (ref 31.5–35.7)
MCV: 76 fL — ABNORMAL LOW (ref 79–97)
Monocytes Absolute: 0.4 10*3/uL (ref 0.1–0.9)
Monocytes: 7 %
Neutrophils Absolute: 4.6 10*3/uL (ref 1.4–7.0)
Neutrophils: 69 %
Platelets: 192 10*3/uL (ref 150–450)
RBC: 4.89 x10E6/uL (ref 4.14–5.80)
RDW: 15.8 % — ABNORMAL HIGH (ref 11.6–15.4)
WBC: 6.6 10*3/uL (ref 3.4–10.8)

## 2021-08-25 LAB — RENAL FUNCTION PANEL
Albumin: 4 g/dL (ref 3.8–4.8)
BUN/Creatinine Ratio: 13 (ref 10–24)
BUN: 12 mg/dL (ref 8–27)
CO2: 24 mmol/L (ref 20–29)
Calcium: 9 mg/dL (ref 8.6–10.2)
Chloride: 104 mmol/L (ref 96–106)
Creatinine, Ser: 0.92 mg/dL (ref 0.76–1.27)
Glucose: 126 mg/dL — ABNORMAL HIGH (ref 70–99)
Phosphorus: 3.1 mg/dL (ref 2.8–4.1)
Potassium: 3.8 mmol/L (ref 3.5–5.2)
Sodium: 143 mmol/L (ref 134–144)
eGFR: 91 mL/min/{1.73_m2} (ref >59)

## 2021-08-26 LAB — FERRITIN: Ferritin: 62 ng/mL (ref 30–400)

## 2021-08-26 LAB — SPECIMEN STATUS REPORT

## 2021-08-30 ENCOUNTER — Ambulatory Visit: Payer: Medicare PPO | Admitting: Family Medicine

## 2021-08-30 ENCOUNTER — Other Ambulatory Visit: Payer: Self-pay | Admitting: Family Medicine

## 2021-08-30 ENCOUNTER — Telehealth: Payer: Self-pay

## 2021-08-30 ENCOUNTER — Other Ambulatory Visit: Payer: Self-pay

## 2021-08-30 ENCOUNTER — Encounter: Payer: Self-pay | Admitting: Family Medicine

## 2021-08-30 VITALS — BP 120/64 | HR 84 | Temp 99.1°F | Ht 72.0 in | Wt 241.0 lb

## 2021-08-30 DIAGNOSIS — R8290 Unspecified abnormal findings in urine: Secondary | ICD-10-CM

## 2021-08-30 DIAGNOSIS — N419 Inflammatory disease of prostate, unspecified: Secondary | ICD-10-CM

## 2021-08-30 DIAGNOSIS — R509 Fever, unspecified: Secondary | ICD-10-CM | POA: Diagnosis not present

## 2021-08-30 DIAGNOSIS — R5383 Other fatigue: Secondary | ICD-10-CM | POA: Diagnosis not present

## 2021-08-30 LAB — POCT URINALYSIS DIPSTICK
Bilirubin, UA: NEGATIVE
Glucose, UA: NEGATIVE
Ketones, UA: NEGATIVE
Nitrite, UA: NEGATIVE
Protein, UA: NEGATIVE
Spec Grav, UA: 1.02 (ref 1.010–1.025)
Urobilinogen, UA: 0.2 E.U./dL
pH, UA: 6 (ref 5.0–8.0)

## 2021-08-30 MED ORDER — SULFAMETHOXAZOLE-TRIMETHOPRIM 800-160 MG PO TABS
1.0000 | ORAL_TABLET | Freq: Two times a day (BID) | ORAL | 0 refills | Status: DC
Start: 2021-08-30 — End: 2021-09-03

## 2021-08-30 NOTE — Telephone Encounter (Signed)
Copied from [place] [id number]. Topic: General - Other >> Aug 30, 2021  8:31 AM Leward Quan A wrote: Reason for CRM: Patient called in asking to be seen by Dr Ronnald Ramp stated that he was told by Baxter Flattery on Friday that if he was not feeling any better to call in and get an appointment. Per patient he is running a fever but does not know his temp wold like to be seen today so asking if he can get a call back by Baxter Flattery today please. Can be reached at  Ph# [phone]

## 2021-08-30 NOTE — Progress Notes (Signed)
Date:  08/30/2021   Name:  [name]   DOB:  05-17-1956   MRN:  [id number]   Chief Complaint: Fever (No energy)  Fever  This is a new (maybe fever friday) problem. The current episode started in the past 7 days. The problem occurs intermittently. The problem has been waxing and waning. The maximum temperature noted was 100 to 100.9 F. Associated symptoms include headaches. Pertinent negatives include no abdominal pain, congestion, coughing, diarrhea, ear pain, muscle aches, nausea, rash, sore throat, urinary pain or wheezing (fatigue). He has tried nothing for the symptoms. The treatment provided moderate relief.  Risk factors: recent sickness   Risk factors: no contaminated food, no contaminated water, no hx of cancer, no immunosuppression, no occupational exposure, no recent travel and no sick contacts    Lab Results  Component Value Date   CREATININE 0.92 08/24/2021   BUN 12 08/24/2021   NA 143 08/24/2021   K 3.8 08/24/2021   CL 104 08/24/2021   CO2 24 08/24/2021   Lab Results  Component Value Date   CHOL 180 03/04/2021   HDL 46 03/04/2021   LDLCALC 119 (H) 03/04/2021   TRIG 80 03/04/2021   CHOLHDL 4.1 08/07/2019   No results found for: TSH No results found for: HGBA1C Lab Results  Component Value Date   WBC 6.6 08/24/2021   HGB 11.6 (L) 08/24/2021   HCT 37.3 (L) 08/24/2021   MCV 76 (L) 08/24/2021   PLT 192 08/24/2021   Lab Results  Component Value Date   ALT 24 07/27/2021   AST 23 07/27/2021   ALKPHOS 73 07/27/2021   BILITOT 1.1 07/27/2021     Review of Systems  Constitutional:  Positive for appetite change, chills, diaphoresis, fatigue and fever.  HENT:  Negative for congestion, ear pain and sore throat.   Respiratory:  Negative for cough, shortness of breath and wheezing (fatigue).   Gastrointestinal:  Negative for abdominal pain, diarrhea and nausea.  Genitourinary:  Positive for frequency and hematuria. Negative for dysuria.  Musculoskeletal:   Positive for arthralgias and myalgias. Negative for neck stiffness.  Skin:  Negative for rash.  Neurological:  Positive for headaches.   Patient Active Problem List   Diagnosis Date Noted   Hypokalemia 08/24/2021   Bacteremia due to Gram-negative bacteria    AF (paroxysmal atrial fibrillation) (HCC)    SIRS (systemic inflammatory response syndrome) ([place]) 07/27/2021   Sepsis secondary to UTI ([place]) 07/27/2021   Ureteral stricture 06/18/2021   Sacroiliitis ([place]) 05/07/2021   Spinal stenosis of lumbosacral region 01/18/2021   Herniated nucleus pulposus, lumbar 03/16/2020   Body mass index (BMI) 33.0-33.9, adult 09/03/2019   Other intervertebral disc displacement, lumbar region 06/25/2019   HNP (herniated nucleus pulposus), lumbar 06/12/2019   UTI due to Klebsiella species 05/08/2019   Biceps tendonitis on right 07/27/2018   Spinal stenosis, 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   Lumbago with sciatica, unspecified side 11/14/2017   Primary osteoarthritis of right knee 10/12/2017   Taking multiple medications for chronic disease 07/30/2015   Bronchitis 07/01/2015   Reactive airway disease 07/01/2015   Allergic sinusitis 07/01/2015   Essential hypertension 07/01/2015   Valvular heart disease 07/01/2015   Metatarsalgia of left foot 06/10/2015   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    Allergies  Allergen Reactions   Meloxicam Palpitations    Past Surgical History:  Procedure Laterality Date   ABDOMINAL EXPOSURE N/A 01/18/2021   Procedure: ABDOMINAL EXPOSURE;  Surgeon: [name], MD;  Location: [facility];  Service: Vascular;  Laterality: N/A;   ANTERIOR LUMBAR FUSION N/A 01/18/2021   Procedure: Anterior Lumbar Interbody Fusion - Lumbar Five-Sarcal One;  Surgeon: Kary Kos, MD;  Location: [facility];  Service: Neurosurgery;  Laterality: N/A;  Anterior Lumbar Interbody Fusion - Lumbar Five-Sacral One   CYSTOSCOPY W/ URETERAL STENT PLACEMENT Left 06/18/2021   Procedure:  CYSTOSCOPY WITH RETROGRADE PYELOGRAM/URETERAL STENT EXCHANGE;  Surgeon: Alexis Frock, MD;  Location: [facility];  Service: Urology;  Laterality: Left;   CYSTOSCOPY WITH RETROGRADE PYELOGRAM, URETEROSCOPY AND STENT PLACEMENT Left 04/21/2021   Procedure: CYSTOSCOPY WITH RETROGRADE PYELOGRAM, DIAGNOSTICURETEROSCOPY AND STENT PLACEMENT;  Surgeon: Alexis Frock, MD;  Location: [facility];  Service: Urology;  Laterality: Left;  1 HR   EXTRACORPOREAL SHOCK WAVE LITHOTRIPSY     approx 2002   LUMBAR [place] SURGERY  2018   REPAIR DURAL / CSF LEAK  2018   post op lumbar diskectomy   REVISION TOTAL KNEE ARTHROPLASTY Left 2015   TOTAL KNEE ARTHROPLASTY Left 2012   TRANSFORAMINAL LUMBAR INTERBODY FUSION (TLIF) WITH PEDICLE SCREW FIXATION 1 LEVEL N/A 06/12/2019   Procedure: LUMBAR FOUR-FIVE TRANSFORAMINAL LUMBAR INTERBODY FUSION (TLIF) WITH PEDICLE SCREW FIXATION;  Surgeon: Kary Kos, MD;  Location: [facility];  Service: Neurosurgery;  Laterality: N/A;   UMBILICAL HERNIA REPAIR  1992    Social History   Tobacco Use   Smoking status: Never   Smokeless tobacco: Former    Types: Chew    Quit date: 03/2020  Vaping Use   Vaping Use: Never used  Substance Use Topics   Alcohol use: Yes    Comment: ocassional   Drug use: Never     Medication list has been reviewed and updated.  Current Meds  Medication Sig   albuterol (VENTOLIN HFA) 108 (90 Base) MCG/ACT inhaler Inhale 2 puffs into the lungs every 6 (six) hours as needed for wheezing or shortness of breath.   apixaban (ELIQUIS) 5 MG TABS tablet Take 5 mg by mouth 2 (two) times daily.   diltiazem (CARDIZEM CD) 180 MG 24 hr capsule Take 1 capsule (180 mg total) by mouth 2 (two) times daily.   fexofenadine (ALLEGRA) 180 MG tablet Take 1 tablet (180 mg total) by mouth daily.   fluticasone (FLONASE) 50 MCG/ACT nasal spray 2 spray q day   Fluticasone-Umeclidin-Vilant (TRELEGY ELLIPTA) 100-62.5-25 MCG/INH AEPB Inhale 1 puff into the lungs daily.    hydrochlorothiazide (HYDRODIURIL) 12.5 MG tablet Take 1 tablet (12.5 mg total) by mouth daily.   lisinopril (ZESTRIL) 20 MG tablet Take 1 tablet (20 mg total) by mouth at bedtime.   metoprolol succinate (TOPROL-XL) 25 MG 24 hr tablet Take 1 tablet (25 mg total) by mouth daily.   montelukast (SINGULAIR) 10 MG tablet Take 1 tablet (10 mg total) by mouth at bedtime.   Olopatadine HCl 0.2 % SOLN Place 1 drop into both eyes 2 (two) times daily as needed (allergies.).   pantoprazole (PROTONIX) 40 MG tablet Take 1 tablet (40 mg total) by mouth every evening.   potassium chloride SA (KLOR-CON) 20 MEQ tablet Take 1-2 tablets (20-40 mEq total) by mouth 2 (two) times daily. Takes one tablet in am and two tabs in pm   tadalafil (CIALIS) 5 MG tablet Take 1-2 tablets (5-10 mg total) by mouth daily as needed for erectile dysfunction.   tamsulosin (FLOMAX) 0.4 MG CAPS capsule TAKE ONE (1) CAPSULE EACH DAY. (Patient taking differently: Take 0.4 mg by mouth daily.)    [facility] 2/9  Scores 07/27/2021 07/02/2021 06/15/2021 03/04/2021  PHQ - 2 Score 0 0 0 0  PHQ- 9 Score 0 0 0 0    GAD 7 : Generalized Anxiety Score 07/27/2021 07/02/2021 06/15/2021 03/04/2021  Nervous, Anxious, on Edge 0 0 0 0  Control/stop worrying 0 0 0 0  Worry too much - different things 0 0 0 0  Trouble relaxing 0 0 0 0  Restless 0 0 0 0  Easily annoyed or irritable 0 0 0 0  Afraid - awful might happen 0 0 0 0  Total GAD 7 Score 0 0 0 0  Anxiety Difficulty Not difficult at all - - -    BP Readings from Last 3 Encounters:  08/30/21 120/64  08/24/21 118/80  08/02/21 (!) 126/98    Physical Exam Vitals and nursing note reviewed.  HENT:     Head: Normocephalic.     Right Ear: Tympanic membrane, ear canal and external ear normal.     Left Ear: Tympanic membrane, ear canal and external ear normal.     Nose: Nose normal.     Mouth/Throat:     Lips: Pink.     Mouth: Mucous membranes are moist.     Tongue: No lesions. Tongue does not deviate  from midline.     Palate: No mass and lesions.     Pharynx: Oropharynx is clear. No oropharyngeal exudate or posterior oropharyngeal erythema.  Eyes:     General: No scleral icterus.       Right eye: No discharge.        Left eye: No discharge.     Conjunctiva/sclera: Conjunctivae normal.     Pupils: Pupils are equal, round, and reactive to light.  Neck:     Thyroid: No thyromegaly.     Vascular: No JVD.     Trachea: No tracheal deviation.  Cardiovascular:     Rate and Rhythm: Normal rate and regular rhythm.     Chest Wall: PMI is not displaced.     Pulses: Normal pulses.     Heart sounds: Normal heart sounds, S1 normal and S2 normal. No murmur heard. No systolic murmur is present.  No diastolic murmur is present.    No friction rub. No gallop. No S3 or S4 sounds.  Pulmonary:     Effort: No respiratory distress.     Breath sounds: Normal breath sounds. No decreased breath sounds, wheezing, rhonchi or rales.  Abdominal:     General: Bowel sounds are normal.     Palpations: Abdomen is soft. There is no hepatomegaly, splenomegaly or mass.     Tenderness: There is no abdominal tenderness. There is no right CVA tenderness, left CVA tenderness, guarding or rebound.     Hernia: No hernia is present. There is no hernia in the umbilical area or ventral area.  Genitourinary:    Prostate: Tender.     Rectum: Normal. Guaiac result negative. No mass.     Comments: boggy Musculoskeletal:        General: No tenderness. Normal range of motion.     Cervical back: Normal range of motion and neck supple.     Right lower leg: No edema.     Left lower leg: No edema.  Lymphadenopathy:     Cervical: No cervical adenopathy.     Right cervical: No superficial, deep or posterior cervical adenopathy.    Left cervical: No superficial, deep or posterior cervical adenopathy.  Skin:    General: Skin is warm.  Findings: No rash.  Neurological:     Mental Status: He is alert and oriented to person,  place, and time.     Cranial Nerves: No cranial nerve deficit.     Deep Tendon Reflexes: Reflexes are normal and symmetric.    Wt Readings from Last 3 Encounters:  08/30/21 241 lb (109.3 kg)  08/24/21 244 lb (110.7 kg)  07/27/21 240 lb (108.9 kg)    BP 120/64   Pulse 84   Temp 99.1 F (37.3 C) (Oral)   Ht 6' (1.829 m)   Wt 241 lb (109.3 kg)   BMI 32.69 kg/m   Assessment and Plan:  1. Fever and chills New onset.  Uncontrolled.  Relatively stable but persistent and complicated.  Patient began having fevers last night with diaphoresis and chills.  Patient recently had a hospitalization for sepsis and has been treated and removal of ureteral stent was done.  Exam today is consistent with a urinary tract concern noting that there were 2+ white cells and 2+ red cells on urinalysis.  We will proceed with CBC renal function panel.  Urine culture was sent and patient has been started on antibiotics as noted below. - CBC with Differential/Platelet - Renal Function Panel - POCT urinalysis dipstick - Urine Culture - sulfamethoxazole-trimethoprim (BACTRIM DS) 800-160 MG tablet; Take 1 tablet by mouth 2 (two) times daily.  Dispense: 28 tablet; Refill: 0  2. Abnormal urinalysis Abnormal urinalysis as noted. - CBC with Differential/Platelet - POCT urinalysis dipstick - Urine Culture - sulfamethoxazole-trimethoprim (BACTRIM DS) 800-160 MG tablet; Take 1 tablet by mouth 2 (two) times daily.  Dispense: 28 tablet; Refill: 0  3. Prostatitis, unspecified prostatitis type Examination noted that there was an exquisitely tender prostate consistent with an acute prostatitis.  This was discussed with urology and they agreed that would we can get culture and go proceed with urinalysis for suspected acute prostatitis.  Patient was started on Septra DS twice a day since he recently was on Cipro and did not want to perpetuate a fluoroquinolone because of its musculoskeletal side effect. - CBC with  Differential/Platelet - POCT urinalysis dipstick - Urine Culture - sulfamethoxazole-trimethoprim (BACTRIM DS) 800-160 MG tablet; Take 1 tablet by mouth 2 (two) times daily.  Dispense: 28 tablet; Refill: 0  4. Fatigue, unspecified type Patient has an overall fatigue as well which is consistent with concerns in addition to the above-mentioned we will also get a thyroid panel with TSH and A1c sed rate as well.  Patient will return on Friday for recheck or sooner if needed. - CBC with Differential/Platelet - Renal Function Panel - Hemoglobin A1c - Thyroid Panel With TSH - Sedimentation rate - POCT urinalysis dipstick - Urine Culture - sulfamethoxazole-trimethoprim (BACTRIM DS) 800-160 MG tablet; Take 1 tablet by mouth 2 (two) times daily.  Dispense: 28 tablet; Refill: 0

## 2021-09-02 ENCOUNTER — Telehealth: Payer: Self-pay

## 2021-09-02 NOTE — Telephone Encounter (Signed)
Copied from [place] [id number]. Topic: General - Other >> Sep 02, 2021 11:43 AM Valere Dross wrote: Reason for CRM: Pts wife called in stating an antibiotic was sent over to the pharmacy for the pt, but they had not been told about taking one, and wanted to know what it was for, and instructions on how to go about taking it. Please advise.

## 2021-09-03 ENCOUNTER — Other Ambulatory Visit: Payer: Self-pay

## 2021-09-03 ENCOUNTER — Ambulatory Visit: Payer: Medicare PPO | Admitting: Family Medicine

## 2021-09-03 ENCOUNTER — Encounter: Payer: Self-pay | Admitting: Family Medicine

## 2021-09-03 VITALS — BP 118/66 | HR 92 | Ht 72.0 in | Wt 248.0 lb

## 2021-09-03 DIAGNOSIS — R509 Fever, unspecified: Secondary | ICD-10-CM

## 2021-09-03 DIAGNOSIS — N419 Inflammatory disease of prostate, unspecified: Secondary | ICD-10-CM | POA: Diagnosis not present

## 2021-09-03 DIAGNOSIS — R5383 Other fatigue: Secondary | ICD-10-CM | POA: Diagnosis not present

## 2021-09-03 DIAGNOSIS — T7840XA Allergy, unspecified, initial encounter: Secondary | ICD-10-CM

## 2021-09-03 LAB — CBC WITH DIFFERENTIAL/PLATELET
Basophils Absolute: 0 10*3/uL (ref 0.0–0.2)
Basos: 1 %
EOS (ABSOLUTE): 0.1 10*3/uL (ref 0.0–0.4)
Eos: 1 %
Hematocrit: 35.2 % — ABNORMAL LOW (ref 37.5–51.0)
Hemoglobin: 11.2 g/dL — ABNORMAL LOW (ref 13.0–17.7)
Immature Grans (Abs): 0 10*3/uL (ref 0.0–0.1)
Immature Granulocytes: 1 %
Lymphocytes Absolute: 1.3 10*3/uL (ref 0.7–3.1)
Lymphs: 14 %
MCH: 24.3 pg — ABNORMAL LOW (ref 26.6–33.0)
MCHC: 31.8 g/dL (ref 31.5–35.7)
MCV: 76 fL — ABNORMAL LOW (ref 79–97)
Monocytes Absolute: 0.9 10*3/uL (ref 0.1–0.9)
Monocytes: 10 %
Neutrophils Absolute: 6.6 10*3/uL (ref 1.4–7.0)
Neutrophils: 73 %
Platelets: 305 10*3/uL (ref 150–450)
RBC: 4.61 x10E6/uL (ref 4.14–5.80)
RDW: 16.1 % — ABNORMAL HIGH (ref 11.6–15.4)
WBC: 8.9 10*3/uL (ref 3.4–10.8)

## 2021-09-03 LAB — RENAL FUNCTION PANEL
Albumin: 4.1 g/dL (ref 3.8–4.8)
BUN/Creatinine Ratio: 11 (ref 10–24)
BUN: 12 mg/dL (ref 8–27)
CO2: 20 mmol/L (ref 20–29)
Calcium: 9.4 mg/dL (ref 8.6–10.2)
Chloride: 102 mmol/L (ref 96–106)
Creatinine, Ser: 1.07 mg/dL (ref 0.76–1.27)
Glucose: 96 mg/dL (ref 70–99)
Phosphorus: 3.2 mg/dL (ref 2.8–4.1)
Potassium: 4 mmol/L (ref 3.5–5.2)
Sodium: 143 mmol/L (ref 134–144)
eGFR: 76 mL/min/{1.73_m2} (ref >59)

## 2021-09-03 LAB — THYROID PANEL WITH TSH
Free Thyroxine Index: 1.9 (ref 1.2–4.9)
T3 Uptake Ratio: 29 % (ref 24–39)
T4, Total: 6.4 ug/dL (ref 4.5–12.0)
TSH: 0.436 u[IU]/mL — ABNORMAL LOW (ref 0.450–4.500)

## 2021-09-03 LAB — HEMOGLOBIN A1C
Est. average glucose Bld gHb Est-mCnc: 131 mg/dL
Hgb A1c MFr Bld: 6.2 % — ABNORMAL HIGH (ref 4.8–5.6)

## 2021-09-03 LAB — SEDIMENTATION RATE: Sed Rate: 55 mm/hr — ABNORMAL HIGH (ref 0–30)

## 2021-09-03 MED ORDER — CIPROFLOXACIN HCL 500 MG PO TABS: 500.0000 mg | ORAL_TABLET | Freq: Two times a day (BID) | ORAL | 0 refills | Status: AC

## 2021-09-03 NOTE — Progress Notes (Signed)
lfa   Date:  09/03/2021   Name:  [name]   DOB:  10-Apr-1956   MRN:  [id number]   Chief Complaint: Hypertension and Labs Only (Wants to discuss blood work today.)  Fever  This is a recurrent problem. The current episode started in the past 7 days. The problem occurs intermittently. The problem has been waxing and waning. The maximum temperature noted was 99 to 99.9 F. Associated symptoms include a rash. Pertinent negatives include no abdominal pain, chest pain, congestion, coughing, diarrhea, ear pain, headaches, muscle aches, nausea, sleepiness, sore throat, urinary pain, vomiting or wheezing. The treatment provided moderate relief.   Lab Results  Component Value Date   CREATININE 1.07 08/30/2021   BUN 12 08/30/2021   NA 143 08/30/2021   K 4.0 08/30/2021   CL 102 08/30/2021   CO2 20 08/30/2021   Lab Results  Component Value Date   CHOL 180 03/04/2021   HDL 46 03/04/2021   LDLCALC 119 (H) 03/04/2021   TRIG 80 03/04/2021   CHOLHDL 4.1 08/07/2019   Lab Results  Component Value Date   TSH 0.436 (L) 08/30/2021   Lab Results  Component Value Date   HGBA1C 6.2 (H) 08/30/2021   Lab Results  Component Value Date   WBC 8.9 08/30/2021   HGB 11.2 (L) 08/30/2021   HCT 35.2 (L) 08/30/2021   MCV 76 (L) 08/30/2021   PLT 305 08/30/2021   Lab Results  Component Value Date   ALT 24 07/27/2021   AST 23 07/27/2021   ALKPHOS 73 07/27/2021   BILITOT 1.1 07/27/2021     Review of Systems  Constitutional:  Positive for fever. Negative for chills.  HENT:  Negative for congestion, drooling, ear discharge, ear pain and sore throat.   Respiratory:  Negative for cough, shortness of breath and wheezing.   Cardiovascular:  Negative for chest pain, palpitations and leg swelling.  Gastrointestinal:  Negative for abdominal pain, blood in stool, constipation, diarrhea, nausea and vomiting.  Endocrine: Negative for polydipsia.  Genitourinary:  Negative for dysuria, frequency,  hematuria and urgency.  Musculoskeletal:  Negative for back pain, myalgias and neck pain.  Skin:  Positive for rash.  Allergic/Immunologic: Negative for environmental allergies.  Neurological:  Negative for dizziness and headaches.  Hematological:  Does not bruise/bleed easily.  Psychiatric/Behavioral:  Negative for suicidal ideas. The patient is not nervous/anxious.    Patient Active Problem List   Diagnosis Date Noted   Hypokalemia 08/24/2021   Bacteremia due to Gram-negative bacteria    AF (paroxysmal atrial fibrillation) (HCC)    SIRS (systemic inflammatory response syndrome) ([place]) 07/27/2021   Sepsis secondary to UTI ([place]) 07/27/2021   Ureteral stricture 06/18/2021   Sacroiliitis ([place]) 05/07/2021   Spinal stenosis of lumbosacral region 01/18/2021   Herniated nucleus pulposus, lumbar 03/16/2020   Body mass index (BMI) 33.0-33.9, adult 09/03/2019   Other intervertebral disc displacement, lumbar region 06/25/2019   HNP (herniated nucleus pulposus), lumbar 06/12/2019   UTI due to Klebsiella species 05/08/2019   Biceps tendonitis on right 07/27/2018   Spinal stenosis, 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   Lumbago with sciatica, unspecified side 11/14/2017   Primary osteoarthritis of right knee 10/12/2017   Taking multiple medications for chronic disease 07/30/2015   Bronchitis 07/01/2015   Reactive airway disease 07/01/2015   Allergic sinusitis 07/01/2015   Essential hypertension 07/01/2015   Valvular heart disease 07/01/2015   Metatarsalgia of left foot 06/10/2015   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    Allergies  Allergen Reactions   Meloxicam Palpitations   Sulfa Antibiotics Rash    Past Surgical History:  Procedure Laterality Date   ABDOMINAL EXPOSURE N/A 01/18/2021   Procedure: ABDOMINAL EXPOSURE;  Surgeon: [name], MD;  Location: [facility];  Service: Vascular;  Laterality: N/A;   ANTERIOR LUMBAR FUSION N/A 01/18/2021   Procedure: Anterior Lumbar  Interbody Fusion - Lumbar Five-Sarcal One;  Surgeon: Kary Kos, MD;  Location: [facility];  Service: Neurosurgery;  Laterality: N/A;  Anterior Lumbar Interbody Fusion - Lumbar Five-Sacral One   CYSTOSCOPY W/ URETERAL STENT PLACEMENT Left 06/18/2021   Procedure: CYSTOSCOPY WITH RETROGRADE PYELOGRAM/URETERAL STENT EXCHANGE;  Surgeon: Alexis Frock, MD;  Location[facility];  Service: Urology;  Laterality: Left;   CYSTOSCOPY WITH RETROGRADE PYELOGRAM, URETEROSCOPY AND STENT PLACEMENT Left 04/21/2021   Procedure: CYSTOSCOPY WITH RETROGRADE PYELOGRAM, DIAGNOSTICURETEROSCOPY AND STENT PLACEMENT;  Surgeon: Alexis Frock, MD;  Location: [facility];  Service: Urology;  Laterality: Left;  1 HR   EXTRACORPOREAL SHOCK WAVE LITHOTRIPSY     approx 2002   LUMBAR [place] SURGERY  2018   REPAIR DURAL / CSF LEAK  2018   post op lumbar diskectomy   REVISION TOTAL KNEE ARTHROPLASTY Left 2015   TOTAL KNEE ARTHROPLASTY Left 2012   TRANSFORAMINAL LUMBAR INTERBODY FUSION (TLIF) WITH PEDICLE SCREW FIXATION 1 LEVEL N/A 06/12/2019   Procedure: LUMBAR FOUR-FIVE TRANSFORAMINAL LUMBAR INTERBODY FUSION (TLIF) WITH PEDICLE SCREW FIXATION;  Surgeon: Kary Kos, MD;  Location: [facility];  Service: Neurosurgery;  Laterality: N/A;   UMBILICAL HERNIA REPAIR  1992    Social History   Tobacco Use   Smoking status: Never   Smokeless tobacco: Former    Types: Chew    Quit date: 03/2020  Vaping Use   Vaping Use: Never used  Substance Use Topics   Alcohol use: Yes    Comment: ocassional   Drug use: Never     Medication list has been reviewed and updated.  Current Meds  Medication Sig   albuterol (VENTOLIN HFA) 108 (90 Base) MCG/ACT inhaler Inhale 2 puffs into the lungs every 6 (six) hours as needed for wheezing or shortness of breath.   apixaban (ELIQUIS) 5 MG TABS tablet Take 5 mg by mouth 2 (two) times daily.   ciprofloxacin (CIPRO) 500 MG tablet Take 1 tablet (500 mg total) by mouth 2 (two) times daily for 10  days.   diltiazem (CARDIZEM CD) 180 MG 24 hr capsule Take 1 capsule (180 mg total) by mouth 2 (two) times daily.   fexofenadine (ALLEGRA) 180 MG tablet Take 1 tablet (180 mg total) by mouth daily.   fluticasone (FLONASE) 50 MCG/ACT nasal spray 2 spray q day   Fluticasone-Umeclidin-Vilant (TRELEGY ELLIPTA) 100-62.5-25 MCG/INH AEPB Inhale 1 puff into the lungs daily.   hydrochlorothiazide (HYDRODIURIL) 12.5 MG tablet Take 1 tablet (12.5 mg total) by mouth daily.   lisinopril (ZESTRIL) 20 MG tablet Take 1 tablet (20 mg total) by mouth at bedtime.   metoprolol succinate (TOPROL-XL) 25 MG 24 hr tablet Take 1 tablet (25 mg total) by mouth daily.   montelukast (SINGULAIR) 10 MG tablet Take 1 tablet (10 mg total) by mouth at bedtime.   Olopatadine HCl 0.2 % SOLN Place 1 drop into both eyes 2 (two) times daily as needed (allergies.).   pantoprazole (PROTONIX) 40 MG tablet Take 1 tablet (40 mg total) by mouth every evening.   potassium chloride SA (KLOR-CON) 20 MEQ tablet Take 1-2 tablets (20-40 mEq total) by mouth 2 (two)  times daily. Takes one tablet in am and two tabs in pm   tadalafil (CIALIS) 5 MG tablet Take 1-2 tablets (5-10 mg total) by mouth daily as needed for erectile dysfunction.   tamsulosin (FLOMAX) 0.4 MG CAPS capsule TAKE ONE (1) CAPSULE EACH DAY. (Patient taking differently: Take 0.4 mg by mouth daily.)    PHQ 2/9 Scores 09/03/2021 07/27/2021 07/02/2021 06/15/2021  PHQ - 2 Score 0 0 0 0  PHQ- 9 Score 3 0 0 0    GAD 7 : Generalized Anxiety Score 09/03/2021 07/27/2021 07/02/2021 06/15/2021  Nervous, Anxious, on Edge 0 0 0 0  Control/stop worrying 0 0 0 0  Worry too much - different things 0 0 0 0  Trouble relaxing 0 0 0 0  Restless 0 0 0 0  Easily annoyed or irritable 0 0 0 0  Afraid - awful might happen 0 0 0 0  Total GAD 7 Score 0 0 0 0  Anxiety Difficulty Not difficult at all Not difficult at all - -    BP Readings from Last 3 Encounters:  09/03/21 118/66  08/30/21 120/64   08/24/21 118/80    Physical Exam Vitals and nursing note reviewed.  HENT:     Head: Normocephalic.     Right Ear: Tympanic membrane, ear canal and external ear normal. There is no impacted cerumen.     Left Ear: Tympanic membrane, ear canal and external ear normal. There is no impacted cerumen.     Nose: Nose normal. No congestion or rhinorrhea.  Eyes:     General: No scleral icterus.       Right eye: No discharge.        Left eye: No discharge.     Conjunctiva/sclera: Conjunctivae normal.     Pupils: Pupils are equal, round, and reactive to light.  Neck:     Thyroid: No thyromegaly.     Vascular: No JVD.     Trachea: No tracheal deviation.  Cardiovascular:     Rate and Rhythm: Normal rate and regular rhythm.     Heart sounds: Normal heart sounds. No murmur heard.   No friction rub. No gallop.  Pulmonary:     Effort: No respiratory distress.     Breath sounds: Normal breath sounds. No wheezing, rhonchi or rales.  Abdominal:     General: Bowel sounds are normal.     Palpations: Abdomen is soft. There is no mass.     Tenderness: There is no abdominal tenderness. There is no guarding or rebound.  Musculoskeletal:        General: No tenderness. Normal range of motion.     Cervical back: Normal range of motion and neck supple.  Lymphadenopathy:     Cervical: No cervical adenopathy.  Skin:    General: Skin is warm.     Findings: No rash.  Neurological:     Mental Status: He is alert and oriented to person, place, and time.     Cranial Nerves: No cranial nerve deficit.     Deep Tendon Reflexes: Reflexes are normal and symmetric.    Wt Readings from Last 3 Encounters:  09/03/21 248 lb (112.5 kg)  08/30/21 241 lb (109.3 kg)  08/24/21 244 lb (110.7 kg)    BP 118/66   Pulse 92   Ht 6' (1.829 m)   Wt 248 lb (112.5 kg)   SpO2 98%   BMI 33.63 kg/m   Assessment and Plan:  1. Fever and chills New onset.  Episodic.  Patient is still having fever when he is off his  Tylenol.  Patient has not been on sulfa preparation until yesterday.  In which his head 3 dosings and think.  Patient is feeling better subjectively and objectively but is still having some low-grade fever which I suspect is secondary to his prostatitis  2. Prostatitis, unspecified prostatitis type New onset.  Persistent.  Patient has not been on antibiotic until recently and however it looks his given problem #3 that he is allergic to the medication.  We will switch him to Cipro 500 mg twice a day for 10 days and then reevaluate after 10 days.  Patient is to call if he is to have any muscle discomfort or diarrhea.  3. Allergic reaction to drug, initial encounter New onset.  Patient had a circumstance that he was switched from sulfa drug to Augmentin sometime ago but he does not remember the circumstances.  Patient awoke this morning with itching and having a rash in the chest area with pruritus generalized.  I suspect this is a reaction to the sulfa drug and we have now placed that he is allergic to sulfa.  The sulfa has been discontinued and patient has been placed on Cipro as noted above  4. Fatigue, unspecified type Patient continues to have fatigue and review of the lab work there is some decrease in the TSH with normal T4 which may suggest that there is a degree of hyperthyroidism.  We will repeat in 6 to 8 weeks and if still remains decreased consider endocrinology consult.

## 2021-09-05 LAB — URINE CULTURE

## 2021-09-14 DIAGNOSIS — I10 Essential (primary) hypertension: Secondary | ICD-10-CM | POA: Diagnosis not present

## 2021-09-14 DIAGNOSIS — I38 Endocarditis, valve unspecified: Secondary | ICD-10-CM | POA: Diagnosis not present

## 2021-09-14 DIAGNOSIS — I480 Paroxysmal atrial fibrillation: Secondary | ICD-10-CM | POA: Diagnosis not present

## 2021-09-14 DIAGNOSIS — E782 Mixed hyperlipidemia: Secondary | ICD-10-CM | POA: Diagnosis not present

## 2021-09-20 ENCOUNTER — Ambulatory Visit (INDEPENDENT_AMBULATORY_CARE_PROVIDER_SITE_OTHER): Payer: Medicare PPO

## 2021-09-20 ENCOUNTER — Ambulatory Visit: Payer: Medicare PPO | Admitting: Family Medicine

## 2021-09-20 ENCOUNTER — Encounter: Payer: Self-pay | Admitting: Family Medicine

## 2021-09-20 ENCOUNTER — Other Ambulatory Visit: Payer: Self-pay | Admitting: Family Medicine

## 2021-09-20 ENCOUNTER — Other Ambulatory Visit: Payer: Self-pay

## 2021-09-20 ENCOUNTER — Telehealth: Payer: Self-pay

## 2021-09-20 VITALS — BP 114/74 | HR 76 | Temp 98.2°F | Ht 73.0 in | Wt 247.0 lb

## 2021-09-20 DIAGNOSIS — Z0000 Encounter for general adult medical examination without abnormal findings: Secondary | ICD-10-CM | POA: Diagnosis not present

## 2021-09-20 DIAGNOSIS — B999 Unspecified infectious disease: Secondary | ICD-10-CM

## 2021-09-20 DIAGNOSIS — R509 Fever, unspecified: Secondary | ICD-10-CM | POA: Diagnosis not present

## 2021-09-20 DIAGNOSIS — R351 Nocturia: Secondary | ICD-10-CM | POA: Diagnosis not present

## 2021-09-20 LAB — POCT URINALYSIS DIPSTICK
Bilirubin, UA: NEGATIVE
Glucose, UA: NEGATIVE
Ketones, UA: NEGATIVE
Leukocytes, UA: NEGATIVE
Nitrite, UA: NEGATIVE
Protein, UA: NEGATIVE
Spec Grav, UA: 1.01 (ref 1.010–1.025)
Urobilinogen, UA: 0.2 E.U./dL
pH, UA: 6 (ref 5.0–8.0)

## 2021-09-20 NOTE — Telephone Encounter (Signed)
Copied from [place] [phone]. Topic: Appointment Scheduling - Scheduling Inquiry for Clinic >> Sep 20, 2021  8:17 AM [name] wrote: Reason for CRM: Pt called to schedule a follow up for his infection/ pt states he still has a slight fever of 99.6/ please advise

## 2021-09-20 NOTE — Progress Notes (Signed)
Subjective:   [name] is a 66 y.o. male who presents for Medicare Annual/Subsequent preventive examination.  Virtual Visit via Telephone Note  I connected with  [name] on 09/20/21 at  8:40 AM EST by telephone and verified that I am speaking with the correct person using two identifiers.  Location: Patient: home Provider: [facility] Persons participating in the virtual visit: [place]   I discussed the limitations, risks, security and privacy concerns of performing an evaluation and management service by telephone and the availability of in person appointments. The patient expressed understanding and agreed to proceed.  Interactive audio and video telecommunications were attempted between this nurse and patient, however failed, due to patient having technical difficulties OR patient did not have access to video capability.  We continued and completed visit with audio only.  Some vital signs may be absent or patient reported.   Clemetine Marker, LPN   Review of Systems     Cardiac Risk Factors include: advanced age (>35men, >98 women);dyslipidemia;male gender;hypertension;obesity (BMI >30kg/m2)     Objective:    There were no vitals filed for this visit. There is no height or weight on file to calculate BMI.  Advanced Directives 09/20/2021 07/27/2021 06/18/2021 06/18/2021 06/08/2021 04/21/2021 01/18/2021  Does Patient Have a Medical Advance Directive? Yes No - Yes Yes Yes Yes  Type of Paramedic of [place];Living will - [place];Living will [place];Living will [place];Living will Healthcare Power of [place]  Does patient want to make changes to medical advance directive? - - No - Patient declined - - - No - Patient declined  Copy of [place] in Chart? Yes - validated most recent copy scanned in chart (See row information)  - Yes - validated most recent copy scanned in chart (See row information) - - No - copy requested No - copy requested  Would patient like information on creating a medical advance directive? - No - Patient declined - - - - -    Current Medications (verified) Outpatient Encounter Medications as of 09/20/2021  Medication Sig   albuterol (VENTOLIN HFA) 108 (90 Base) MCG/ACT inhaler Inhale 2 puffs into the lungs every 6 (six) hours as needed for wheezing or shortness of breath.   apixaban (ELIQUIS) 5 MG TABS tablet Take 5 mg by mouth 2 (two) times daily.   diltiazem (CARDIZEM CD) 180 MG 24 hr capsule Take 1 capsule (180 mg total) by mouth 2 (two) times daily.   ferrous sulfate 325 (65 FE) MG tablet Take 1 tablet by mouth daily with breakfast.   fexofenadine (ALLEGRA) 180 MG tablet Take 1 tablet (180 mg total) by mouth daily.   fluticasone (FLONASE) 50 MCG/ACT nasal spray 2 spray q day   hydrochlorothiazide (HYDRODIURIL) 12.5 MG tablet Take 1 tablet (12.5 mg total) by mouth daily.   lisinopril (ZESTRIL) 20 MG tablet Take 1 tablet (20 mg total) by mouth at bedtime.   metoprolol succinate (TOPROL-XL) 25 MG 24 hr tablet Take 1 tablet (25 mg total) by mouth daily.   montelukast (SINGULAIR) 10 MG tablet Take 1 tablet (10 mg total) by mouth at bedtime.   Multiple Vitamins-Minerals (MULTIVITAMIN MEN) TABS Take by mouth.   Olopatadine HCl 0.2 % SOLN Place 1 drop into both eyes 2 (two) times daily as needed (allergies.).   pantoprazole (PROTONIX) 40 MG tablet Take 1 tablet (40 mg total) by mouth every evening.  potassium chloride SA (KLOR-CON) 20 MEQ tablet Take 1-2 tablets (20-40 mEq total) by mouth 2 (two) times daily. Takes one tablet in am and two tabs in pm   tadalafil (CIALIS) 5 MG tablet Take 1-2 tablets (5-10 mg total) by mouth daily as needed for erectile dysfunction.   tamsulosin (FLOMAX) 0.4 MG CAPS capsule TAKE ONE (1) CAPSULE EACH DAY. (Patient taking differently: Take 0.4 mg by mouth daily.)    Fluticasone-Umeclidin-Vilant (TRELEGY ELLIPTA) 100-62.5-25 MCG/INH AEPB Inhale 1 puff into the lungs daily. (Patient not taking: Reported on 09/20/2021)   No facility-administered encounter medications on file as of 09/20/2021.    Allergies (verified) Meloxicam and Sulfa antibiotics   History: Past Medical History:  Diagnosis Date   Acquired deafness, right    per pt completed deafness right ear since age 18   Allergic rhinitis, seasonal    Anticoagulant long-term use    eliquis--- managed by cardiology   BPH associated with nocturia    urologist--- dr Tresa Moore   Chronic low back pain    DDD (degenerative disc disease), lumbosacral    Foraminal stenosis of lumbosacral region    GERD (gastroesophageal reflux disease)    Heart murmur    History of kidney stones    History of skin cancer    Hydronephrosis, left    Hyperlipidemia, mixed    Hypertension, essential    followed by pcp and cardiology   Hypokalemia    OA (osteoarthritis)    Paroxysmal atrial fibrillation ([place]) 01/28/2017   cardiologist--- dr Nehemiah Massed;   pt had ETT 03-16-2017 per cardio note normal, event monitor 02-09-2017 showed baseline SR some peroids  Afib with controlled rate with occ PAC/ PVC;  per pt had echo many yrs ago prior to AFib   Past Surgical History:  Procedure Laterality Date   ABDOMINAL EXPOSURE N/A 01/18/2021   Procedure: ABDOMINAL EXPOSURE;  Surgeon: [name], MD;  Location: [facility];  Service: Vascular;  Laterality: N/A;   ANTERIOR LUMBAR FUSION N/A 01/18/2021   Procedure: Anterior Lumbar Interbody Fusion - Lumbar Five-Sarcal One;  Surgeon: Kary Kos, MD;  Location: [facility];  Service: Neurosurgery;  Laterality: N/A;  Anterior Lumbar Interbody Fusion - Lumbar Five-Sacral One   CYSTOSCOPY W/ URETERAL STENT PLACEMENT Left 06/18/2021   Procedure: CYSTOSCOPY WITH RETROGRADE PYELOGRAM/URETERAL STENT EXCHANGE;  Surgeon: Alexis Frock, MD;  Location: [facility];  Service: Urology;  Laterality: Left;    CYSTOSCOPY WITH RETROGRADE PYELOGRAM, URETEROSCOPY AND STENT PLACEMENT Left 04/21/2021   Procedure: CYSTOSCOPY WITH RETROGRADE PYELOGRAM, DIAGNOSTICURETEROSCOPY AND STENT PLACEMENT;  Surgeon: Alexis Frock, MD;  [facility];  Service: Urology;  Laterality: Left;  1 HR   EXTRACORPOREAL SHOCK WAVE LITHOTRIPSY     approx 2002   LUMBAR [place] SURGERY  2018   REPAIR DURAL / CSF LEAK  2018   post op lumbar diskectomy   REVISION TOTAL KNEE ARTHROPLASTY Left 2015   TOTAL KNEE ARTHROPLASTY Left 2012   TRANSFORAMINAL LUMBAR INTERBODY FUSION (TLIF) WITH PEDICLE SCREW FIXATION 1 LEVEL N/A 06/12/2019   Procedure: LUMBAR FOUR-FIVE TRANSFORAMINAL LUMBAR INTERBODY FUSION (TLIF) WITH PEDICLE SCREW FIXATION;  Surgeon: Kary Kos, MD;  Location: [facility];  Service: Neurosurgery;  Laterality: N/A;   UMBILICAL HERNIA REPAIR  1992   Family History  Problem Relation Age of Onset   Cancer Mother    Cancer Father    Cancer Brother    Hyperlipidemia Brother    Social History   Socioeconomic History   Marital status: Married  Spouse name: [name]   Number of children: Not on file   Years of education: Not on file   Highest education level: Not on file  Occupational History   Not on file  Tobacco Use   Smoking status: Never   Smokeless tobacco: Former    Types: Sarina Ser    Quit date: 03/2020  Vaping Use   Vaping Use: Never used  Substance and Sexual Activity   Alcohol use: Yes    Comment: ocassional   Drug use: Never   Sexual activity: Yes  Other Topics Concern   Not on file  Social History Narrative   Not on file   Social Determinants of Health   Financial Resource Strain: Low Risk    Difficulty of Paying Living Expenses: Not hard at all  Food Insecurity: No Food Insecurity   Worried About Charity fundraiser in the Last Year: Never true   [place] in the Last Year: Never true  Transportation Needs: No Transportation Needs   Lack of Transportation  (Medical): No   Lack of Transportation (Non-Medical): No  Physical Activity: Inactive   Days of Exercise per Week: 0 days   Minutes of Exercise per Session: 0 min  Stress: No Stress Concern Present   Feeling of Stress : Not at all  Social Connections: Socially Integrated   Frequency of Communication with Friends and Family: More than three times a week   Frequency of Social Gatherings with Friends and Family: More than three times a week   Attends Religious Services: More than 4 times per year   Active Member of [employer] or Organizations: Yes   Attends Music therapist: More than 4 times per year   Marital Status: Married    Tobacco Counseling Counseling given: Not Answered   Clinical Intake:  Pre-visit preparation completed: Yes  Pain : No/denies pain     Nutritional Risks: None Diabetes: No  How often do you need to have someone help you when you read instructions, pamphlets, or other written materials from your doctor or pharmacy?: 1 - Never   Interpreter Needed?: No  Information entered by :: Clemetine Marker LPN   Activities of Daily Living In your present state of health, do you have any difficulty performing the following activities: 09/20/2021 07/28/2021  Hearing? N N  Comment - -  Vision? N N  Difficulty concentrating or making decisions? N N  Walking or climbing stairs? N N  Dressing or bathing? N N  Doing errands, shopping? N N  Preparing Food and eating ? N -  Using the Toilet? N -  In the past six months, have you accidently leaked urine? N -  Do you have problems with loss of bowel control? N -  Managing your Medications? N -  Managing your Finances? N -  Housekeeping or managing your Housekeeping? N -  Some recent data might be hidden    Patient Care Team: Juline Patch, MD as PCP - General (Family Medicine)  Indicate any recent Medical Services you may have received from other than [facility] providers in the past year (date may be  approximate).     Assessment:   This is a routine wellness examination for [name].  Hearing/Vision screen Hearing Screening - Comments:: Pt c/o deaf in right ear; declines hearing difficulty in left ear Vision Screening - Comments:: Annual vision screenings done with Dr. Mallie Mussel; due for exam - needs new provider   Dietary issues and exercise  activities discussed: Current Exercise Habits: The patient does not participate in regular exercise at present, Exercise limited by: Other - see comments (s/p ureter surgery)   Goals Addressed   None    Depression Screen PHQ 2/9 Scores 09/20/2021 09/03/2021 07/27/2021 07/02/2021 06/15/2021 03/04/2021 09/14/2020  PHQ - 2 Score 0 0 0 0 0 0 0  PHQ- 9 Score 4 3 0 0 0 0 -    Fall Risk Fall Risk  09/20/2021 09/03/2021 07/27/2021 07/02/2021 09/14/2020  Falls in the past year? 0 0 0 0 0  Number falls in past yr: 0 0 0 0 0  Injury with Fall? 0 0 0 0 0  Risk for fall due to : No Fall Risks No Fall Risks No Fall Risks No Fall Risks No Fall Risks  Follow up Falls prevention discussed Falls evaluation completed Falls evaluation completed Falls evaluation completed Falls prevention discussed    FALL RISK PREVENTION PERTAINING TO THE HOME:  Any stairs in or around the home? Yes  If so, are there any without handrails? No  Home free of loose throw rugs in walkways, pet beds, electrical cords, etc? Yes  Adequate lighting in your home to reduce risk of falls? Yes   ASSISTIVE DEVICES UTILIZED TO PREVENT FALLS:  Life alert? No  Use of a cane, walker or w/c? No  Grab bars in the bathroom? Yes  Shower chair or bench in shower? Yes  Elevated toilet seat or a handicapped toilet? Yes   TIMED UP AND GO:  Was the test performed? No . Telephonic visit.   Cognitive Function: Normal cognitive status assessed by direct observation by this Nurse Health Advisor. No abnormalities found.          Immunizations Immunization History  Administered Date(s)  Administered   Fluad Quad(high Dose 65+) 08/24/2021   Influenza, High Dose Seasonal PF 07/23/2019   Influenza,inj,Quad PF,6+ Mos 07/30/2015, 07/26/2016, 09/18/2018   Influenza-Unspecified 07/24/2020   Moderna Sars-Covid-2 Vaccination 10/22/2019, 11/22/2019, 09/09/2020   Pneumococcal Conjugate-13 08/07/2019   Pneumococcal Polysaccharide-23 07/30/2015, 12/16/2020   Tdap 06/21/2012    TDAP status: Up to date  Flu Vaccine status: Up to date  Pneumococcal vaccine status: Up to date  Covid-19 vaccine status: Completed vaccines  Qualifies for Shingles Vaccine? Yes   Zostavax completed No   Shingrix Completed?: No.    Education has been provided regarding the importance of this vaccine. Patient has been advised to call insurance company to determine out of pocket expense if they have not yet received this vaccine. Advised may also receive vaccine at local pharmacy or Health Dept. Verbalized acceptance and understanding.  Screening Tests Health Maintenance  Topic Date Due   COVID-19 Vaccine (4 - Booster for Moderna series) 11/04/2020   Zoster Vaccines- Shingrix (1 of 2) 10/01/2021 (Originally 03/15/2004)   TETANUS/TDAP  06/21/2022   COLONOSCOPY (Pts 45-42yrs Insurance coverage will need to be confirmed)  11/07/2022   Pneumonia Vaccine 29+ Years old  Completed   INFLUENZA VACCINE  Completed   Hepatitis C Screening  Completed   HPV VACCINES  Aged Out    Health Maintenance  Health Maintenance Due  Topic Date Due   COVID-19 Vaccine (4 - Booster for Moderna series) 11/04/2020    Colorectal cancer screening: Type of screening: Colonoscopy. Completed 11/08/19. Repeat every 3 years  Lung Cancer Screening: (Low Dose CT Chest recommended if Age 62-80 years, 30 pack-year currently smoking OR have quit w/in 15years.) does not qualify  Additional Screening:  Hepatitis C Screening:  does qualify; Completed 12/14/16  Vision Screening: Recommended annual ophthalmology exams for early detection  of glaucoma and other disorders of the eye. Is the patient up to date with their annual eye exam?  No  Who is the provider or what is the name of the office in which the patient attends annual eye exams? Dr. Mallie Mussel; needs new provider.   Dental Screening: Recommended annual dental exams for proper oral hygiene  Community Resource Referral / Chronic Care Management: CRR required this visit?  No   CCM required this visit?  No      Plan:     I have personally reviewed and noted the following in the patient's chart:   Medical and social history Use of alcohol, tobacco or illicit drugs  Current medications and supplements including opioid prescriptions. Patient is not currently taking opioid prescriptions. Functional ability and status Nutritional status Physical activity Advanced directives List of other physicians Hospitalizations, surgeries, and ER visits in previous 12 months Vitals Screenings to include cognitive, depression, and falls Referrals and appointments  In addition, I have reviewed and discussed with patient certain preventive protocols, quality metrics, and best practice recommendations. A written personalized care plan for preventive services as well as general preventive health recommendations were provided to patient.     Clemetine Marker, LPN   [id number]   Nurse Notes: pt states he completed antibiotics for prostatitis and starting running a low grade fever on 11/24. Fever has been off an on. Telephone enounter already placed and sent to provider.

## 2021-09-20 NOTE — Patient Instructions (Signed)
[name] , Thank you for taking time to come for your Medicare Wellness Visit. I appreciate your ongoing commitment to your health goals. Please review the following plan we discussed and let me know if I can assist you in the future.   Screening recommendations/referrals: Colonoscopy: done 11/08/19. Repeat 10/2022 Recommended yearly ophthalmology/optometry visit for glaucoma screening and checkup Recommended yearly dental visit for hygiene and checkup  Vaccinations: Influenza vaccine: done 08/24/21 Pneumococcal vaccine: done 12/16/20 Tdap vaccine: done 06/21/12 Shingles vaccine: Shingrix discussed. Please contact your pharmacy for coverage information.  Covid-19:  done 10/22/19, 11/22/19 & 09/09/20  Conditions/risks identified: Recommend increasing physical activity as tolerated  Next appointment: Follow up in one year for your annual wellness visit.   Preventive Care 63 Years and Older, Male Preventive care refers to lifestyle choices and visits with your health care provider that can promote health and wellness. What does preventive care include? A yearly physical exam. This is also called an annual well check. Dental exams once or twice a year. Routine eye exams. Ask your health care provider how often you should have your eyes checked. Personal lifestyle choices, including: Daily care of your teeth and gums. Regular physical activity. Eating a healthy diet. Avoiding tobacco and drug use. Limiting alcohol use. Practicing safe sex. Taking low doses of aspirin every day. Taking vitamin and mineral supplements as recommended by your health care provider. What happens during an annual well check? The services and screenings done by your health care provider during your annual well check will depend on your age, overall health, lifestyle risk factors, and family history of disease. Counseling  Your health care provider may ask you questions about your: Alcohol use. Tobacco  use. Drug use. Emotional well-being. Home and relationship well-being. Sexual activity. Eating habits. History of falls. Memory and ability to understand (cognition). Work and work Statistician. Screening  You may have the following tests or measurements: Height, weight, and BMI. Blood pressure. Lipid and cholesterol levels. These may be checked every 5 years, or more frequently if you are over 50 years old. Skin check. Lung cancer screening. You may have this screening every year starting at age 30 if you have a 30-pack-year history of smoking and currently smoke or have quit within the past 15 years. Fecal occult blood test (FOBT) of the stool. You may have this test every year starting at age 40. Flexible sigmoidoscopy or colonoscopy. You may have a sigmoidoscopy every 5 years or a colonoscopy every 10 years starting at age 60. Prostate cancer screening. Recommendations will vary depending on your family history and other risks. Hepatitis C blood test. Hepatitis B blood test. Sexually transmitted disease (STD) testing. Diabetes screening. This is done by checking your blood sugar (glucose) after you have not eaten for a while (fasting). You may have this done every 1-3 years. Abdominal aortic aneurysm (AAA) screening. You may need this if you are a current or former smoker. Osteoporosis. You may be screened starting at age 35 if you are at high risk. Talk with your health care provider about your test results, treatment options, and if necessary, the need for more tests. Vaccines  Your health care provider may recommend certain vaccines, such as: Influenza vaccine. This is recommended every year. Tetanus, diphtheria, and acellular pertussis (Tdap, Td) vaccine. You may need a Td booster every 10 years. Zoster vaccine. You may need this after age 54. Pneumococcal 13-valent conjugate (PCV13) vaccine. One dose is recommended after age 79. Pneumococcal polysaccharide (PPSV23) vaccine.  One dose is recommended after age 55. Talk to your health care provider about which screenings and vaccines you need and how often you need them. This information is not intended to replace advice given to you by your health care provider. Make sure you discuss any questions you have with your health care provider. Document Released: 11/06/2015 Document Revised: 06/29/2016 Document Reviewed: 08/11/2015 Elsevier Interactive Patient Education  2017 [employer] Prevention in the Home Falls can cause injuries. They can happen to people of all ages. There are many things you can do to make your home safe and to help prevent falls. What can I do on the outside of my home? Regularly fix the edges of walkways and driveways and fix any cracks. Remove anything that might make you trip as you walk through a door, such as a raised step or threshold. Trim any bushes or trees on the path to your home. Use bright outdoor lighting. Clear any walking paths of anything that might make someone trip, such as rocks or tools. Regularly check to see if handrails are loose or broken. Make sure that both sides of any steps have handrails. Any raised decks and porches should have guardrails on the edges. Have any leaves, snow, or ice cleared regularly. Use sand or salt on walking paths during winter. Clean up any spills in your garage right away. This includes oil or grease spills. What can I do in the bathroom? Use night lights. Install grab bars by the toilet and in the tub and shower. Do not use towel bars as grab bars. Use non-skid mats or decals in the tub or shower. If you need to sit down in the shower, use a plastic, non-slip stool. Keep the floor dry. Clean up any water that spills on the floor as soon as it happens. Remove soap buildup in the tub or shower regularly. Attach bath mats securely with double-sided non-slip rug tape. Do not have throw rugs and other things on the floor that can make  you trip. What can I do in the bedroom? Use night lights. Make sure that you have a light by your bed that is easy to reach. Do not use any sheets or blankets that are too big for your bed. They should not hang down onto the floor. Have a firm chair that has side arms. You can use this for support while you get dressed. Do not have throw rugs and other things on the floor that can make you trip. What can I do in the kitchen? Clean up any spills right away. Avoid walking on wet floors. Keep items that you use a lot in easy-to-reach places. If you need to reach something above you, use a strong step stool that has a grab bar. Keep electrical cords out of the way. Do not use floor polish or wax that makes floors slippery. If you must use wax, use non-skid floor wax. Do not have throw rugs and other things on the floor that can make you trip. What can I do with my stairs? Do not leave any items on the stairs. Make sure that there are handrails on both sides of the stairs and use them. Fix handrails that are broken or loose. Make sure that handrails are as long as the stairways. Check any carpeting to make sure that it is firmly attached to the stairs. Fix any carpet that is loose or worn. Avoid having throw rugs at the top or bottom of the  stairs. If you do have throw rugs, attach them to the floor with carpet tape. Make sure that you have a light switch at the top of the stairs and the bottom of the stairs. If you do not have them, ask someone to add them for you. What else can I do to help prevent falls? Wear shoes that: Do not have high heels. Have rubber bottoms. Are comfortable and fit you well. Are closed at the toe. Do not wear sandals. If you use a stepladder: Make sure that it is fully opened. Do not climb a closed stepladder. Make sure that both sides of the stepladder are locked into place. Ask someone to hold it for you, if possible. Clearly mark and make sure that you can  see: Any grab bars or handrails. First and last steps. Where the edge of each step is. Use tools that help you move around (mobility aids) if they are needed. These include: Canes. Walkers. Scooters. Crutches. Turn on the lights when you go into a dark area. Replace any light bulbs as soon as they burn out. Set up your furniture so you have a clear path. Avoid moving your furniture around. If any of your floors are uneven, fix them. If there are any pets around you, be aware of where they are. Review your medicines with your doctor. Some medicines can make you feel dizzy. This can increase your chance of falling. Ask your doctor what other things that you can do to help prevent falls. This information is not intended to replace advice given to you by your health care provider. Make sure you discuss any questions you have with your health care provider. Document Released: 08/06/2009 Document Revised: 03/17/2016 Document Reviewed: 11/14/2014 Elsevier Interactive Patient Education  2017 [employer].

## 2021-09-20 NOTE — Progress Notes (Signed)
Date:  09/20/2021   Name:  [name]   DOB:  1956/08/04   MRN:  [id number]   Chief Complaint: Fever (99.6)  Fever  This is a new problem. The current episode started more than 1 year ago. The problem occurs intermittently. The problem has been gradually improving. His temperature was unmeasured prior to arrival. Pertinent negatives include no abdominal pain, coughing or headaches.  Urinary Tract Infection  This is a new problem. The problem occurs intermittently. Maximum temperature: low grade. Pertinent negatives include no discharge, frequency or urgency. The treatment provided moderate relief.   Lab Results  Component Value Date   NA 143 08/30/2021   K 4.0 08/30/2021   CO2 20 08/30/2021   GLUCOSE 96 08/30/2021   BUN 12 08/30/2021   CREATININE 1.07 08/30/2021   CALCIUM 9.4 08/30/2021   EGFR 76 08/30/2021   GFRNONAA >60 08/02/2021   Lab Results  Component Value Date   CHOL 180 03/04/2021   HDL 46 03/04/2021   LDLCALC 119 (H) 03/04/2021   TRIG 80 03/04/2021   CHOLHDL 4.1 08/07/2019   Lab Results  Component Value Date   TSH 0.436 (L) 08/30/2021   Lab Results  Component Value Date   HGBA1C 6.2 (H) 08/30/2021   Lab Results  Component Value Date   WBC 8.9 08/30/2021   HGB 11.2 (L) 08/30/2021   HCT 35.2 (L) 08/30/2021   MCV 76 (L) 08/30/2021   PLT 305 08/30/2021   Lab Results  Component Value Date   ALT 24 07/27/2021   AST 23 07/27/2021   ALKPHOS 73 07/27/2021   BILITOT 1.1 07/27/2021   No components found for: VITD  Review of Systems  Constitutional:  Positive for fever.  Respiratory:  Negative for cough.   Gastrointestinal:  Negative for abdominal pain.  Genitourinary:  Negative for frequency and urgency.  Neurological:  Negative for headaches.   Patient Active Problem List   Diagnosis Date Noted   Hypokalemia 08/24/2021   Bacteremia due to Gram-negative bacteria    AF (paroxysmal atrial fibrillation) (HCC)    SIRS (systemic inflammatory  response syndrome) ([place]) 07/27/2021   Sepsis secondary to UTI ([place]) 07/27/2021   Ureteral stricture 06/18/2021   Sacroiliitis ([place]) 05/07/2021   Spinal stenosis of lumbosacral region 01/18/2021   Herniated nucleus pulposus, lumbar 03/16/2020   Body mass index (BMI) 33.0-33.9, adult 09/03/2019   Other intervertebral disc displacement, lumbar region 06/25/2019   HNP (herniated nucleus pulposus), lumbar 06/12/2019   UTI due to Klebsiella species 05/08/2019   Biceps tendonitis on right 07/27/2018   Spinal stenosis, 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   Lumbago with sciatica, unspecified side 11/14/2017   Primary osteoarthritis of right knee 10/12/2017   Taking multiple medications for chronic disease 07/30/2015   Bronchitis 07/01/2015   Reactive airway disease 07/01/2015   Allergic sinusitis 07/01/2015   Essential hypertension 07/01/2015   Valvular heart disease 07/01/2015   Metatarsalgia of left foot 06/10/2015   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    Allergies  Allergen Reactions   Meloxicam Palpitations   Sulfa Antibiotics Rash    Past Surgical History:  Procedure Laterality Date   ABDOMINAL EXPOSURE N/A 01/18/2021   Procedure: ABDOMINAL EXPOSURE;  Surgeon: [name], MD;  Location: [place];  Service: Vascular;  Laterality: N/A;   ANTERIOR LUMBAR FUSION N/A 01/18/2021   Procedure: Anterior Lumbar Interbody Fusion - Lumbar Five-Sarcal One;  Surgeon: Kary Kos, MD;  Location: [facility];  Service: Neurosurgery;  Laterality: N/A;  Anterior Lumbar Interbody Fusion - Lumbar Five-Sacral One   CYSTOSCOPY W/ URETERAL STENT PLACEMENT Left 06/18/2021   Procedure: CYSTOSCOPY WITH RETROGRADE PYELOGRAM/URETERAL STENT EXCHANGE;  Surgeon: Alexis Frock, MD;  Location: [facility];  Service: Urology;  Laterality: Left;   CYSTOSCOPY WITH RETROGRADE PYELOGRAM, URETEROSCOPY AND STENT PLACEMENT Left 04/21/2021   Procedure: CYSTOSCOPY WITH RETROGRADE PYELOGRAM, DIAGNOSTICURETEROSCOPY AND  STENT PLACEMENT;  Surgeon: Alexis Frock, MD;  Location: [facility];  Service: Urology;  Laterality: Left;  1 HR   EXTRACORPOREAL SHOCK WAVE LITHOTRIPSY     approx 2002   LUMBAR [place] SURGERY  2018   REPAIR DURAL / CSF LEAK  2018   post op lumbar diskectomy   REVISION TOTAL KNEE ARTHROPLASTY Left 2015   TOTAL KNEE ARTHROPLASTY Left 2012   TRANSFORAMINAL LUMBAR INTERBODY FUSION (TLIF) WITH PEDICLE SCREW FIXATION 1 LEVEL N/A 06/12/2019   Procedure: LUMBAR FOUR-FIVE TRANSFORAMINAL LUMBAR INTERBODY FUSION (TLIF) WITH PEDICLE SCREW FIXATION;  Surgeon: Kary Kos, MD;  Location: [place];  Service: Neurosurgery;  Laterality: N/A;   UMBILICAL HERNIA REPAIR  1992    Social History   Tobacco Use   Smoking status: Never   Smokeless tobacco: Former    Types: Chew    Quit date: 03/2020  Vaping Use   Vaping Use: Never used  Substance Use Topics   Alcohol use: Yes    Comment: ocassional   Drug use: Never     Medication list has been reviewed and updated.  No outpatient medications have been marked as taking for the 09/20/21 encounter (Office Visit) with Juline Patch, MD.    [facility] 2/9 Scores 09/20/2021 09/20/2021 09/03/2021 07/27/2021  PHQ - 2 Score 0 0 0 0  PHQ- 9 Score 0 4 3 0    GAD 7 : Generalized Anxiety Score 09/20/2021 09/03/2021 07/27/2021 07/02/2021  Nervous, Anxious, on Edge 0 0 0 0  Control/stop worrying 0 0 0 0  Worry too much - different things 0 0 0 0  Trouble relaxing 0 0 0 0  Restless 0 0 0 0  Easily annoyed or irritable 0 0 0 0  Afraid - awful might happen 0 0 0 0  Total GAD 7 Score 0 0 0 0  Anxiety Difficulty Not difficult at all Not difficult at all Not difficult at all -    BP Readings from Last 3 Encounters:  09/03/21 118/66  08/30/21 120/64  08/24/21 118/80    Physical Exam Vitals reviewed.  HENT:     Head: Normocephalic.     Right Ear: Tympanic membrane and external ear normal.     Left Ear: Tympanic membrane and external ear normal.      Nose: Nose normal. No congestion or rhinorrhea.  Eyes:     General: No scleral icterus.       Right eye: No discharge.        Left eye: No discharge.     Conjunctiva/sclera: Conjunctivae normal.     Pupils: Pupils are equal, round, and reactive to light.  Neck:     Thyroid: No thyromegaly.     Vascular: No JVD.     Trachea: No tracheal deviation.  Cardiovascular:     Rate and Rhythm: Normal rate and regular rhythm.     Heart sounds: Normal heart sounds. No murmur heard.   No friction rub. No gallop.  Pulmonary:     Effort: No respiratory distress.     Breath sounds: Normal breath sounds. No wheezing, rhonchi or rales.  Abdominal:     General: Bowel sounds are normal.     Palpations: Abdomen is soft. There is no mass.     Tenderness: There is no abdominal tenderness. There is no guarding or rebound.  Musculoskeletal:        General: No tenderness. Normal range of motion.     Cervical back: Normal range of motion and neck supple.  Lymphadenopathy:     Cervical: No cervical adenopathy.  Skin:    General: Skin is warm.     Findings: No rash.  Neurological:     Mental Status: He is alert and oriented to person, place, and time.     Cranial Nerves: No cranial nerve deficit.     Deep Tendon Reflexes: Reflexes are normal and symmetric.    Wt Readings from Last 3 Encounters:  09/03/21 248 lb (112.5 kg)  08/30/21 241 lb (109.3 kg)  08/24/21 244 lb (110.7 kg)    Ht _0  (1.854 m)   BMI 32.72 kg/m   Assessment and Plan:  1. Infection Relatively new onset.  Perhaps persistent.  Patient just completed 2-week course of Cipro for prostatitis.  Experienced fever without documentation of actual temperature.  Urinalysis was done and it was negative for leukocytes but culture was sent to document clearance.  And CBC with differential was obtained. - POCT urinalysis dipstick - Urine Culture - CBC with Differential/Platelet  2. Fever and chills As noted above we will check both  CBC and urine culture for clearance of infection. - CBC with Differential/Platelet

## 2021-09-21 DIAGNOSIS — C44529 Squamous cell carcinoma of skin of other part of trunk: Secondary | ICD-10-CM | POA: Diagnosis not present

## 2021-09-21 DIAGNOSIS — L300 Nummular dermatitis: Secondary | ICD-10-CM | POA: Diagnosis not present

## 2021-09-21 DIAGNOSIS — D485 Neoplasm of uncertain behavior of skin: Secondary | ICD-10-CM | POA: Diagnosis not present

## 2021-09-21 DIAGNOSIS — C44519 Basal cell carcinoma of skin of other part of trunk: Secondary | ICD-10-CM | POA: Diagnosis not present

## 2021-09-21 LAB — CBC WITH DIFFERENTIAL/PLATELET
Basophils Absolute: 0 10*3/uL (ref 0.0–0.2)
Basos: 1 %
EOS (ABSOLUTE): 0.1 10*3/uL (ref 0.0–0.4)
Eos: 2 %
Hematocrit: 34.4 % — ABNORMAL LOW (ref 37.5–51.0)
Hemoglobin: 10.8 g/dL — ABNORMAL LOW (ref 13.0–17.7)
Immature Grans (Abs): 0 10*3/uL (ref 0.0–0.1)
Immature Granulocytes: 0 %
Lymphocytes Absolute: 1.6 10*3/uL (ref 0.7–3.1)
Lymphs: 27 %
MCH: 24.3 pg — ABNORMAL LOW (ref 26.6–33.0)
MCHC: 31.4 g/dL — ABNORMAL LOW (ref 31.5–35.7)
MCV: 77 fL — ABNORMAL LOW (ref 79–97)
Monocytes Absolute: 0.6 10*3/uL (ref 0.1–0.9)
Monocytes: 10 %
Neutrophils Absolute: 3.6 10*3/uL (ref 1.4–7.0)
Neutrophils: 60 %
Platelets: 248 10*3/uL (ref 150–450)
RBC: 4.45 x10E6/uL (ref 4.14–5.80)
RDW: 17 % — ABNORMAL HIGH (ref 11.6–15.4)
WBC: 5.8 10*3/uL (ref 3.4–10.8)

## 2021-09-23 LAB — URINE CULTURE: Organism ID, Bacteria: NO GROWTH

## 2021-09-28 ENCOUNTER — Ambulatory Visit: Payer: Self-pay | Admitting: Urology

## 2021-10-03 ENCOUNTER — Ambulatory Visit
Admission: EM | Admit: 2021-10-03 | Discharge: 2021-10-03 | Disposition: A | Discharge status: Home / Self Care | Payer: Medicare PPO | Attending: Physician Assistant | Admitting: Physician Assistant

## 2021-10-03 ENCOUNTER — Encounter: Payer: Self-pay | Admitting: Emergency Medicine

## 2021-10-03 ENCOUNTER — Other Ambulatory Visit: Payer: Self-pay

## 2021-10-03 DIAGNOSIS — J069 Acute upper respiratory infection, unspecified: Secondary | ICD-10-CM | POA: Insufficient documentation

## 2021-10-03 DIAGNOSIS — R509 Fever, unspecified: Secondary | ICD-10-CM | POA: Diagnosis not present

## 2021-10-03 DIAGNOSIS — U071 COVID-19: Secondary | ICD-10-CM | POA: Diagnosis not present

## 2021-10-03 DIAGNOSIS — Z789 Other specified health status: Secondary | ICD-10-CM | POA: Insufficient documentation

## 2021-10-03 LAB — RESP PANEL BY RT-PCR (FLU A&B, COVID) ARPGX2
Influenza A by PCR: NEGATIVE
Influenza B by PCR: NEGATIVE
SARS Coronavirus 2 by RT PCR: POSITIVE — AB

## 2021-10-03 MED ORDER — BENZONATATE 100 MG PO CAPS: 100.0000 mg | ORAL_CAPSULE | Freq: Three times a day (TID) | ORAL | 0 refills | Status: DC

## 2021-10-03 MED ORDER — MOLNUPIRAVIR 200 MG PO CAPS: 4.0000 | ORAL_CAPSULE | Freq: Two times a day (BID) | ORAL | 0 refills | Status: AC

## 2021-10-03 MED ORDER — FLUTICASONE PROPIONATE 50 MCG/ACT NA SUSP: 2.0000 | Freq: Every day | NASAL | 0 refills | Status: DC

## 2021-10-03 NOTE — ED Triage Notes (Signed)
Patient c/o sinus congestion and pressure and cough that started 5 days ago.  Patient states that he woke up this morning with temp 99.7.

## 2021-10-03 NOTE — ED Provider Notes (Signed)
[facility]    CSN: [id number] Arrival date & time: 10/03/21  0846      History   Chief Complaint Chief Complaint  Patient presents with   Cough   Sinus Problem    HPI [name] is a 65 y.o. male.   HPI  Cold Symptoms: Patient reports that about 5 days ago he started having symptoms of dry cough, sinus congestion and some fatigue.  He states that this morning he awoke with a low-grade fever and some worsening cough and wanted to get checked out before he leaves for [place].  He denies any vomiting, chest pain or shortness of breath.  No known sick contacts.  He has not been taking anything for symptoms.  Past Medical History:  Diagnosis Date   Acquired deafness, right    per pt completed deafness right ear since age 73   Allergic rhinitis, seasonal    Anticoagulant long-term use    eliquis--- managed by cardiology   BPH associated with nocturia    urologist--- dr Tresa Moore   Chronic low back pain    DDD (degenerative disc disease), lumbosacral    Foraminal stenosis of lumbosacral region    GERD (gastroesophageal reflux disease)    Heart murmur    History of kidney stones    History of skin cancer    Hydronephrosis, left    Hyperlipidemia, mixed    Hypertension, essential    followed by pcp and cardiology   Hypokalemia    OA (osteoarthritis)    Paroxysmal atrial fibrillation ([place]) 01/28/2017   cardiologist--- dr Nehemiah Massed;   pt had ETT 03-16-2017 per cardio note normal, event monitor 02-09-2017 showed baseline SR some peroids  Afib with controlled rate with occ PAC/ PVC;  per pt had echo many yrs ago prior to AFib    Patient Active Problem List   Diagnosis Date Noted   Hypokalemia 08/24/2021   Bacteremia due to Gram-negative bacteria    AF (paroxysmal atrial fibrillation) ([place])    SIRS (systemic inflammatory response syndrome) ([place]) 07/27/2021   Sepsis secondary to UTI ([place]) 07/27/2021   Ureteral stricture 06/18/2021   Sacroiliitis ([place])  05/07/2021   Spinal stenosis of lumbosacral region 01/18/2021   Herniated nucleus pulposus, lumbar 03/16/2020   Body mass index (BMI) 33.0-33.9, adult 09/03/2019   Other intervertebral disc displacement, lumbar region 06/25/2019   HNP (herniated nucleus pulposus), lumbar 06/12/2019   UTI due to Klebsiella species 05/08/2019   Biceps tendonitis on right 07/27/2018   Spinal stenosis, 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   Lumbago with sciatica, unspecified side 11/14/2017   Primary osteoarthritis of right knee 10/12/2017   Taking multiple medications for chronic disease 07/30/2015   Bronchitis 07/01/2015   Reactive airway disease 07/01/2015   Allergic sinusitis 07/01/2015   Essential hypertension 07/01/2015   Valvular heart disease 07/01/2015   Metatarsalgia of left foot 06/10/2015   [place] arthritis 12/15/2013    Past Surgical History:  Procedure Laterality Date   ABDOMINAL EXPOSURE N/A 01/18/2021   Procedure: ABDOMINAL EXPOSURE;  Surgeon: [name], MD;  Location: [facility];  Service: Vascular;  Laterality: N/A;   ANTERIOR LUMBAR FUSION N/A 01/18/2021   Procedure: Anterior Lumbar Interbody Fusion - Lumbar Five-Sarcal One;  Surgeon: Kary Kos, MD;  Location: [place];  Service: Neurosurgery;  Laterality: N/A;  Anterior Lumbar Interbody Fusion - Lumbar Five-Sacral One   CYSTOSCOPY W/ URETERAL STENT PLACEMENT Left 06/18/2021   Procedure: CYSTOSCOPY WITH RETROGRADE PYELOGRAM/URETERAL STENT EXCHANGE;  Surgeon: Tresa Moore,  Hubbard Robinson, MD;  Location[facility];  Service: Urology;  Laterality: Left;   CYSTOSCOPY WITH RETROGRADE PYELOGRAM, URETEROSCOPY AND STENT PLACEMENT Left 04/21/2021   Procedure: CYSTOSCOPY WITH RETROGRADE PYELOGRAM, DIAGNOSTICURETEROSCOPY AND STENT PLACEMENT;  Surgeon: Alexis Frock, MD;  Location: [facility];  Service: Urology;  Laterality: Left;  1 HR   EXTRACORPOREAL SHOCK WAVE LITHOTRIPSY     approx 2002   LUMBAR [place] SURGERY  2018   REPAIR  DURAL / CSF LEAK  2018   post op lumbar diskectomy   REVISION TOTAL KNEE ARTHROPLASTY Left 2015   TOTAL KNEE ARTHROPLASTY Left 2012   TRANSFORAMINAL LUMBAR INTERBODY FUSION (TLIF) WITH PEDICLE SCREW FIXATION 1 LEVEL N/A 06/12/2019   Procedure: LUMBAR FOUR-FIVE TRANSFORAMINAL LUMBAR INTERBODY FUSION (TLIF) WITH PEDICLE SCREW FIXATION;  Surgeon: Kary Kos, MD;  Location: [place];  Service: Neurosurgery;  Laterality: N/A;   UMBILICAL HERNIA REPAIR  1992       Home Medications    Prior to Admission medications   Medication Sig Start Date End Date Taking? Authorizing Provider  apixaban (ELIQUIS) 5 MG TABS tablet Take 5 mg by mouth 2 (two) times daily.   Yes [provider]  diltiazem (CARDIZEM CD) 180 MG 24 hr capsule Take 1 capsule (180 mg total) by mouth 2 (two) times daily. 08/24/21  Yes Juline Patch, MD  ferrous sulfate 325 (65 FE) MG tablet Take 1 tablet by mouth daily with breakfast.   Yes [provider]  fexofenadine (ALLEGRA) 180 MG tablet Take 1 tablet (180 mg total) by mouth daily. 08/24/21  Yes Juline Patch, MD  fluticasone (FLONASE) 50 MCG/ACT nasal spray 2 spray q day 08/24/21  Yes Juline Patch, MD  hydrochlorothiazide (HYDRODIURIL) 12.5 MG tablet Take 1 tablet (12.5 mg total) by mouth daily. 08/24/21  Yes Juline Patch, MD  lisinopril (ZESTRIL) 20 MG tablet Take 1 tablet (20 mg total) by mouth at bedtime. 08/24/21  Yes Juline Patch, MD  metoprolol succinate (TOPROL-XL) 25 MG 24 hr tablet Take 1 tablet (25 mg total) by mouth daily. 08/24/21  Yes Juline Patch, MD  montelukast (SINGULAIR) 10 MG tablet Take 1 tablet (10 mg total) by mouth at bedtime. 08/24/21  Yes Juline Patch, MD  Multiple Vitamins-Minerals (MULTIVITAMIN MEN) TABS Take by mouth.   Yes [provider]  pantoprazole (PROTONIX) 40 MG tablet Take 1 tablet (40 mg total) by mouth every evening. 08/24/21  Yes Juline Patch, MD  potassium chloride SA (KLOR-CON) 20 MEQ tablet Take 1-2  tablets (20-40 mEq total) by mouth 2 (two) times daily. Takes one tablet in am and two tabs in pm Patient taking differently: Take 20 mEq by mouth in the morning, at noon, and at bedtime. 08/24/21  Yes Juline Patch, MD  tamsulosin (FLOMAX) 0.4 MG CAPS capsule TAKE ONE (1) CAPSULE EACH DAY. Patient taking differently: Take 0.4 mg by mouth daily. 02/10/21  Yes [name], MD  Acetaminophen (TYLENOL PO) Take by mouth as needed.    [provider]  Olopatadine HCl 0.2 % SOLN Place 1 drop into both eyes 2 (two) times daily as needed (allergies.).    [provider]  tadalafil (CIALIS) 5 MG tablet Take 1-2 tablets (5-10 mg total) by mouth daily as needed for erectile dysfunction. 09/22/20   [name], MD    Family History Family History  Problem Relation Age of Onset   Cancer Mother    Cancer Father  Cancer Brother    Hyperlipidemia Brother     Social History Social History   Tobacco Use   Smoking status: Never   Smokeless tobacco: Former    Types: Chew    Quit date: 03/2020  Vaping Use   Vaping Use: Never used  Substance Use Topics   Alcohol use: Yes    Comment: ocassional   Drug use: Never     Allergies   Meloxicam and Sulfa antibiotics   Review of Systems Review of Systems  As stated above in HPI Physical Exam Triage Vital Signs ED Triage Vitals  Enc Vitals Group     BP 10/03/21 0857 (!) 141/83     Pulse Rate 10/03/21 0857 100     Resp 10/03/21 0857 15     Temp 10/03/21 0857 99.3 F (37.4 C)     Temp Source 10/03/21 0857 Oral     SpO2 10/03/21 0857 97 %     Weight 10/03/21 0853 250 lb (113.4 kg)     Height 10/03/21 0853 6\' 1"  (1.854 m)     Head Circumference --      Peak Flow --      Pain Score 10/03/21 0853 0     Pain Loc --      Pain Edu? --      Excl. in [place]? --    No data found.  Updated Vital Signs BP (!) 141/83 (BP Location: Left Arm)   Pulse 100   Temp 99.3 F (37.4 C) (Oral)   Resp 15   Ht 6\' 1"  (1.854 m)    Wt 250 lb (113.4 kg)   SpO2 97%   BMI 32.98 kg/m   Physical Exam Vitals and nursing note reviewed.  Constitutional:      General: He is not in acute distress.    Appearance: Normal appearance. He is not ill-appearing, toxic-appearing or diaphoretic.  HENT:     Head: Normocephalic and atraumatic.     Right Ear: Tympanic membrane normal.     Left Ear: Tympanic membrane normal.     Nose: Congestion present. No rhinorrhea.     Mouth/Throat:     Mouth: Mucous membranes are moist.     Pharynx: No oropharyngeal exudate or posterior oropharyngeal erythema.  Eyes:     Extraocular Movements: Extraocular movements intact.     Conjunctiva/sclera: Conjunctivae normal.     Pupils: Pupils are equal, round, and reactive to light.  Cardiovascular:     Rate and Rhythm: Normal rate and regular rhythm.     Heart sounds: Normal heart sounds.  Pulmonary:     Effort: Pulmonary effort is normal.     Breath sounds: Normal breath sounds.  Musculoskeletal:     Cervical back: Normal range of motion and neck supple.  Lymphadenopathy:     Cervical: Cervical adenopathy present.  Skin:    General: Skin is warm.  Neurological:     Mental Status: He is alert and oriented to person, place, and time.  Psychiatric:        Mood and Affect: Mood normal.        Behavior: Behavior normal.        Thought Content: Thought content normal.        Judgment: Judgment normal.     UC Treatments / Results  Labs (all labs ordered are listed, but only abnormal results are displayed) Labs Reviewed  RESP PANEL BY RT-PCR (FLU A&B, COVID) ARPGX2 - Abnormal; Notable for the following components:  Result Value   SARS Coronavirus 2 by RT PCR POSITIVE (*)    All other components within normal limits    EKG   Radiology No results found.  Procedures Procedures (including critical care time)  Medications Ordered in UC Medications - No data to display  Initial Impression / Assessment and Plan / UC Course  I  have reviewed the triage vital signs and the nursing notes.  Pertinent labs & imaging results that were available during my care of the patient were reviewed by me and considered in my medical decision making (see chart for details).     New.  Positive for COVID-19 which correlates with his symptoms.  We discussed treatment options including antiviral medications.  We discussed risks and benefits along with these medications.  We discussed the importance of rest, hydration, symptom monitoring, O2 monitoring and red flag signs and symptoms.  He requests an antibiotic and we discussed how the antiviral medication works compared to an antibiotic for current symptoms. Final Clinical Impressions(s) / UC Diagnoses   Final diagnoses:  None   Discharge Instructions   None    ED Prescriptions   None    PDMP not reviewed this encounter.   Hughie Closs, [place] 10/03/21 1016

## 2021-10-08 DIAGNOSIS — D485 Neoplasm of uncertain behavior of skin: Secondary | ICD-10-CM | POA: Diagnosis not present

## 2021-10-08 DIAGNOSIS — Z859 Personal history of malignant neoplasm, unspecified: Secondary | ICD-10-CM | POA: Diagnosis not present

## 2021-10-08 DIAGNOSIS — Z85828 Personal history of other malignant neoplasm of skin: Secondary | ICD-10-CM | POA: Diagnosis not present

## 2021-10-08 DIAGNOSIS — C44519 Basal cell carcinoma of skin of other part of trunk: Secondary | ICD-10-CM | POA: Diagnosis not present

## 2021-10-08 DIAGNOSIS — C44729 Squamous cell carcinoma of skin of left lower limb, including hip: Secondary | ICD-10-CM | POA: Diagnosis not present

## 2021-10-08 DIAGNOSIS — D225 Melanocytic nevi of trunk: Secondary | ICD-10-CM | POA: Diagnosis not present

## 2021-10-08 DIAGNOSIS — D0439 Carcinoma in situ of skin of other parts of face: Secondary | ICD-10-CM | POA: Diagnosis not present

## 2021-10-08 DIAGNOSIS — L578 Other skin changes due to chronic exposure to nonionizing radiation: Secondary | ICD-10-CM | POA: Diagnosis not present

## 2021-10-08 DIAGNOSIS — L570 Actinic keratosis: Secondary | ICD-10-CM | POA: Diagnosis not present

## 2021-10-12 ENCOUNTER — Ambulatory Visit: Payer: Medicare PPO | Admitting: Urology

## 2021-10-12 ENCOUNTER — Encounter: Payer: Self-pay | Admitting: Urology

## 2021-10-12 ENCOUNTER — Other Ambulatory Visit: Payer: Self-pay

## 2021-10-12 ENCOUNTER — Other Ambulatory Visit: Payer: Self-pay | Admitting: Urology

## 2021-10-12 VITALS — BP 170/99 | HR 77 | Ht 73.0 in | Wt 250.0 lb

## 2021-10-12 DIAGNOSIS — N135 Crossing vessel and stricture of ureter without hydronephrosis: Secondary | ICD-10-CM

## 2021-10-12 DIAGNOSIS — I10 Essential (primary) hypertension: Secondary | ICD-10-CM | POA: Diagnosis not present

## 2021-10-12 DIAGNOSIS — Z6832 Body mass index (BMI) 32.0-32.9, adult: Secondary | ICD-10-CM | POA: Diagnosis not present

## 2021-10-12 DIAGNOSIS — N401 Enlarged prostate with lower urinary tract symptoms: Secondary | ICD-10-CM | POA: Diagnosis not present

## 2021-10-12 DIAGNOSIS — N138 Other obstructive and reflux uropathy: Secondary | ICD-10-CM

## 2021-10-12 DIAGNOSIS — N529 Male erectile dysfunction, unspecified: Secondary | ICD-10-CM

## 2021-10-12 DIAGNOSIS — R351 Nocturia: Secondary | ICD-10-CM | POA: Diagnosis not present

## 2021-10-12 DIAGNOSIS — Z125 Encounter for screening for malignant neoplasm of prostate: Secondary | ICD-10-CM | POA: Diagnosis not present

## 2021-10-12 DIAGNOSIS — M5440 Lumbago with sciatica, unspecified side: Secondary | ICD-10-CM | POA: Diagnosis not present

## 2021-10-12 MED ORDER — TAMSULOSIN HCL 0.4 MG PO CAPS: 0.4000 mg | ORAL_CAPSULE | Freq: Every day | ORAL | 3 refills | Status: DC

## 2021-10-12 MED ORDER — TADALAFIL 5 MG PO TABS
5.0000 mg | ORAL_TABLET | Freq: Every day | ORAL | 3 refills | Status: DC | PRN
Start: 2021-10-12 — End: 2022-08-01

## 2021-10-12 NOTE — Patient Instructions (Signed)
Prostate Cancer Screening ?Prostate cancer screening is testing that is done to check for the presence of prostate cancer in men. The prostate gland is a walnut-sized gland that is located below the bladder and in front of the rectum in males. The function of the prostate is to add fluid to semen during ejaculation. Prostate cancer is one of the most common types of cancer in men. ?Who should have prostate cancer screening? ?Screening recommendations vary based on age and other risk factors, as well as between the professional organizations who make the recommendations. ?In general, screening is recommended if: ?You are age 50 to 70 and have an average risk for prostate cancer. You should talk with your health care provider about your need for screening and how often screening should be done. Because most prostate cancers are slow growing and will not cause death, screening in this age group is generally reserved for men who have a 10- to 15-year life expectancy. ?You are younger than age 50, and you have these risk factors: ?Having a father, brother, or uncle who has been diagnosed with prostate cancer. The risk is higher if your family member's cancer occurred at an early age or if you have multiple family members with prostate cancer at an early age. ?Being a male who is Black or is of Caribbean or sub-Saharan African descent. ?In general, screening is not recommended if: ?You are younger than age 40. ?You are between the ages of 40 and 49 and you have no risk factors. ?You are 70 years of age or older. At this age, the risks that screening can cause are greater than the benefits that it may provide. ?If you are at high risk for prostate cancer, your health care provider may recommend that you have screenings more often or that you start screening at a younger age. ?How is screening for prostate cancer done? ?The recommended prostate cancer screening test is a blood test called the prostate-specific antigen (PSA)  test. PSA is a protein that is made in the prostate. As you age, your prostate naturally produces more PSA. Abnormally high PSA levels may be caused by: ?Prostate cancer. ?An enlarged prostate that is not caused by cancer (benign prostatic hyperplasia, or BPH). This condition is very common in older men. ?A prostate gland infection (prostatitis) or urinary tract infection. ?Certain medicines such as male hormones (like testosterone) or other medicines that raise testosterone levels. ?A rectal exam may be done as part of prostate cancer screening to help provide information about the size of your prostate gland. When a rectal exam is performed, it should be done after the PSA level is drawn to avoid any effect on the results. ?Depending on the PSA results, you may need more tests, such as: ?A physical exam to check the size of your prostate gland, if not done as part of screening. ?Blood and imaging tests. ?A procedure to remove tissue samples from your prostate gland for testing (biopsy). This is the only way to know for certain if you have prostate cancer. ?What are the benefits of prostate cancer screening? ?Screening can help to identify cancer at an early stage, before symptoms start and when the cancer can be treated more easily. ?There is a small chance that screening may lower your risk of dying from prostate cancer. The chance is small because prostate cancer is a slow-growing cancer, and most men with prostate cancer die from a different cause. ?What are the risks of prostate cancer screening? ?The main   risk of prostate cancer screening is diagnosing and treating prostate cancer that would never have caused any symptoms or problems. This is called overdiagnosisand overtreatment. PSA screening cannot tell you if your PSA is high due to cancer or a different cause. A prostate biopsy is the only procedure to diagnose prostate cancer. Even the results of a biopsy may not tell you if your cancer needs to be  treated. Slow-growing prostate cancer may not need any treatment other than monitoring, so diagnosing and treating it may cause unnecessary stress or other side effects. ?Questions to ask your health care provider ?When should I start prostate cancer screening? ?What is my risk for prostate cancer? ?How often do I need screening? ?What type of screening tests do I need? ?How do I get my test results? ?What do my results mean? ?Do I need treatment? ?Where to find more information ?The American Cancer Society: www.cancer.org ?American Urological Association: www.auanet.org ?Contact a health care provider if: ?You have difficulty urinating. ?You have pain when you urinate or ejaculate. ?You have blood in your urine or semen. ?You have pain in your back or in the area of your prostate. ?Summary ?Prostate cancer is a common type of cancer in men. The prostate gland is located below the bladder and in front of the rectum. This gland adds fluid to semen during ejaculation. ?Prostate cancer screening may identify cancer at an early stage, when the cancer can be treated more easily and is less likely to have spread to other areas of the body. ?The prostate-specific antigen (PSA) test is the recommended screening test for prostate cancer, but it has associated risks. ?Discuss the risks and benefits of prostate cancer screening with your health care provider. If you are age 70 or older, the risks that screening can cause are greater than the benefits that it may provide. ?This information is not intended to replace advice given to you by your health care provider. Make sure you discuss any questions you have with your health care provider. ?Document Revised: 04/05/2021 Document Reviewed: 04/05/2021 ?Elsevier Patient Education ? 2022 Elsevier Inc. ? ?

## 2021-10-12 NOTE — Progress Notes (Signed)
° °  10/12/2021 8:34 AM   [name] 1956-09-04 [id number]  Reason for visit: Follow up elevated PSA, BPH, ED, left ureteral stricture  HPI: 65 year old male who I have been following for an elevated PSA, BPH and ED.  Briefly, he had an elevated PSA of 11 after history of urinary retention and UTI after back surgery, which remained elevated at 10.4 and follow-up.  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  PSA down trended to 4.5 in October 2020, and PSA values were likely elevated secondary to infection after his episode of urinary retention.  At our last visit, I recommended a repeat PSA, which came all the way back to normal at 2.5 in November 2021.  He continues on Flomax which is working well for his urinary symptoms and he really has no urinary complaints today.  He is also using the Cialis as needed which is working for the erections.  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 Tresa Moore at [facility] urology in [place] who ultimately performed a left robotic ureteral reimplant and psoas hitch on 06/18/2021.  This was complicated by UTI/sepsis from urinary source and he was admitted in October at [facility] for antibiotics.  A CT abdomen and pelvis was performed at that time, I personally viewed and interpreted those images that showed the left ureteral stent in appropriate position, no evidence of fluid collections in the pelvis, decompressed bladder with psoas hitch, no stones.  His stent has since been removed.  He has follow-up with Dr. Tresa Moore in March, likely with a renal ultrasound prior to evaluate for any silent hydronephrosis.  -Flomax and Cialis refilled -Keep follow-up with Dr. Tresa Moore in March for history of left  ureteral reimplant and psoas hitch -RTC 1 year PVR, IPSS, PSA prior   [name], MD  [facility] [address] [place], [place] [address] [phone]

## 2021-10-13 DIAGNOSIS — M1711 Unilateral primary osteoarthritis, right knee: Secondary | ICD-10-CM | POA: Diagnosis not present

## 2021-10-26 DIAGNOSIS — M5459 Other low back pain: Secondary | ICD-10-CM | POA: Diagnosis not present

## 2021-10-26 DIAGNOSIS — M2560 Stiffness of unspecified joint, not elsewhere classified: Secondary | ICD-10-CM | POA: Diagnosis not present

## 2021-10-29 ENCOUNTER — Other Ambulatory Visit: Payer: Self-pay

## 2021-10-29 DIAGNOSIS — C44519 Basal cell carcinoma of skin of other part of trunk: Secondary | ICD-10-CM | POA: Diagnosis not present

## 2021-10-29 DIAGNOSIS — D649 Anemia, unspecified: Secondary | ICD-10-CM | POA: Diagnosis not present

## 2021-10-29 DIAGNOSIS — L988 Other specified disorders of the skin and subcutaneous tissue: Secondary | ICD-10-CM | POA: Diagnosis not present

## 2021-10-30 LAB — CBC WITH DIFFERENTIAL/PLATELET
Basophils Absolute: 0 10*3/uL (ref 0.0–0.2)
Basos: 0 %
EOS (ABSOLUTE): 0.1 10*3/uL (ref 0.0–0.4)
Eos: 2 %
Hematocrit: 41 % (ref 37.5–51.0)
Hemoglobin: 13.2 g/dL (ref 13.0–17.7)
Immature Grans (Abs): 0 10*3/uL (ref 0.0–0.1)
Immature Granulocytes: 0 %
Lymphocytes Absolute: 1.2 10*3/uL (ref 0.7–3.1)
Lymphs: 19 %
MCH: 25.4 pg — ABNORMAL LOW (ref 26.6–33.0)
MCHC: 32.2 g/dL (ref 31.5–35.7)
MCV: 79 fL (ref 79–97)
Monocytes Absolute: 0.4 10*3/uL (ref 0.1–0.9)
Monocytes: 7 %
Neutrophils Absolute: 4.5 10*3/uL (ref 1.4–7.0)
Neutrophils: 72 %
Platelets: 204 10*3/uL (ref 150–450)
RBC: 5.2 x10E6/uL (ref 4.14–5.80)
RDW: 17.1 % — ABNORMAL HIGH (ref 11.6–15.4)
WBC: 6.3 10*3/uL (ref 3.4–10.8)

## 2021-11-02 DIAGNOSIS — M2560 Stiffness of unspecified joint, not elsewhere classified: Secondary | ICD-10-CM | POA: Diagnosis not present

## 2021-11-02 DIAGNOSIS — M5459 Other low back pain: Secondary | ICD-10-CM | POA: Diagnosis not present

## 2021-11-09 DIAGNOSIS — M5459 Other low back pain: Secondary | ICD-10-CM | POA: Diagnosis not present

## 2021-11-09 DIAGNOSIS — M2560 Stiffness of unspecified joint, not elsewhere classified: Secondary | ICD-10-CM | POA: Diagnosis not present

## 2021-11-11 ENCOUNTER — Inpatient Hospital Stay: Payer: Medicare PPO | Admitting: Family Medicine

## 2021-11-11 DIAGNOSIS — M2560 Stiffness of unspecified joint, not elsewhere classified: Secondary | ICD-10-CM | POA: Diagnosis not present

## 2021-11-11 DIAGNOSIS — M5459 Other low back pain: Secondary | ICD-10-CM | POA: Diagnosis not present

## 2021-11-12 ENCOUNTER — Other Ambulatory Visit: Payer: Self-pay | Admitting: Family Medicine

## 2021-11-12 DIAGNOSIS — M1712 Unilateral primary osteoarthritis, left knee: Secondary | ICD-10-CM

## 2021-11-12 DIAGNOSIS — M5136 Other intervertebral disc degeneration, lumbar region: Secondary | ICD-10-CM

## 2021-11-12 NOTE — Telephone Encounter (Signed)
Requested medication (s) are due for refill today: yes  Requested medication (s) are on the active medication list: no  Last refill:  08/16/21  Future visit scheduled: yes  Notes to clinic:  med was dc'd on 06/19/21. Please advise     Requested Prescriptions  Pending Prescriptions Disp Refills   celecoxib (CELEBREX) 200 MG capsule [Pharmacy Med Name: Celecoxib 200 MG Oral Capsule] 90 capsule 0    Sig: Take 1 capsule by mouth once daily     Analgesics:  COX2 Inhibitors Passed - 11/12/2021 12:44 PM      Passed - HGB in normal range and within 360 days    Hemoglobin  Date Value Ref Range Status  10/29/2021 13.2 13.0 - 17.7 g/dL Final          Passed - Cr in normal range and within 360 days    Creatinine  Date Value Ref Range Status  12/04/2013 1.31 (H) 0.60 - 1.30 mg/dL Final   Creatinine, Ser  Date Value Ref Range Status  08/30/2021 1.07 0.76 - 1.27 mg/dL Final          Passed - Patient is not pregnant      Passed - Valid encounter within last 12 months    Recent Outpatient Visits           1 month ago Infection   [facility] Juline Patch, MD   2 months ago Fever and chills   [facility] Juline Patch, MD   2 months ago Fever and chills   [facility] Juline Patch, MD   2 months ago Essential hypertension   [place], [place]   3 months ago Sepsis, due to unspecified organism, unspecified whether acute organ dysfunction present [facility])   [facility] Montel Culver, MD       Future Appointments             In 3 months Juline Patch, MD [facility], [facility]

## 2021-11-15 ENCOUNTER — Other Ambulatory Visit: Payer: Self-pay | Admitting: Family Medicine

## 2021-11-15 DIAGNOSIS — M5136 Other intervertebral disc degeneration, lumbar region: Secondary | ICD-10-CM

## 2021-11-15 DIAGNOSIS — M5459 Other low back pain: Secondary | ICD-10-CM | POA: Diagnosis not present

## 2021-11-15 DIAGNOSIS — M1712 Unilateral primary osteoarthritis, left knee: Secondary | ICD-10-CM

## 2021-11-15 DIAGNOSIS — M2560 Stiffness of unspecified joint, not elsewhere classified: Secondary | ICD-10-CM | POA: Diagnosis not present

## 2021-11-16 ENCOUNTER — Other Ambulatory Visit: Payer: Self-pay | Admitting: Family Medicine

## 2021-11-16 DIAGNOSIS — L988 Other specified disorders of the skin and subcutaneous tissue: Secondary | ICD-10-CM | POA: Diagnosis not present

## 2021-11-16 DIAGNOSIS — M5136 Other intervertebral disc degeneration, lumbar region: Secondary | ICD-10-CM

## 2021-11-16 DIAGNOSIS — Z79891 Long term (current) use of opiate analgesic: Secondary | ICD-10-CM | POA: Diagnosis not present

## 2021-11-16 DIAGNOSIS — I10 Essential (primary) hypertension: Secondary | ICD-10-CM | POA: Diagnosis not present

## 2021-11-16 DIAGNOSIS — C44529 Squamous cell carcinoma of skin of other part of trunk: Secondary | ICD-10-CM | POA: Diagnosis not present

## 2021-11-16 DIAGNOSIS — M47816 Spondylosis without myelopathy or radiculopathy, lumbar region: Secondary | ICD-10-CM | POA: Diagnosis not present

## 2021-11-16 DIAGNOSIS — M461 Sacroiliitis, not elsewhere classified: Secondary | ICD-10-CM | POA: Diagnosis not present

## 2021-11-16 DIAGNOSIS — M1712 Unilateral primary osteoarthritis, left knee: Secondary | ICD-10-CM

## 2021-11-16 NOTE — Telephone Encounter (Signed)
Requested medication (s) are due for refill today:   Provider to review  Requested medication (s) are on the active medication list:   No  Future visit scheduled:   Yes   Last ordered: 06/19/2021 discontinued  Returned because this was discontinue.    Please advise.    Thanks   Requested Prescriptions  Pending Prescriptions Disp Refills   celecoxib (CELEBREX) 200 MG capsule [Pharmacy Med Name: Celecoxib 200 MG Oral Capsule] 90 capsule 0    Sig: Take 1 capsule by mouth once daily     Analgesics:  COX2 Inhibitors Passed - 11/15/2021  7:05 PM      Passed - HGB in normal range and within 360 days    Hemoglobin  Date Value Ref Range Status  10/29/2021 13.2 13.0 - 17.7 g/dL Final          Passed - Cr in normal range and within 360 days    Creatinine  Date Value Ref Range Status  12/04/2013 1.31 (H) 0.60 - 1.30 mg/dL Final   Creatinine, Ser  Date Value Ref Range Status  08/30/2021 1.07 0.76 - 1.27 mg/dL Final          Passed - Patient is not pregnant      Passed - Valid encounter within last 12 months    Recent Outpatient Visits           1 month ago Infection   [facility] Juline Patch, MD   2 months ago Fever and chills   [facility] Juline Patch, MD   2 months ago Fever and chills   [facility] Juline Patch, MD   2 months ago Essential hypertension   [place], MD   3 months ago Sepsis, due to unspecified organism, unspecified whether acute organ dysfunction present [facility])   [facility] Montel Culver, MD       Future Appointments             In 3 months Juline Patch, MD [facility], [facility]

## 2021-11-17 ENCOUNTER — Telehealth: Payer: Self-pay

## 2021-11-17 ENCOUNTER — Other Ambulatory Visit: Payer: Self-pay

## 2021-11-17 ENCOUNTER — Telehealth: Payer: Self-pay | Admitting: Family Medicine

## 2021-11-17 DIAGNOSIS — M1990 Unspecified osteoarthritis, unspecified site: Secondary | ICD-10-CM

## 2021-11-17 NOTE — Telephone Encounter (Signed)
I spoke to wife/ Vicky and discussed the celebrex. It was d/c'd in the hospital. We were unsure as to why, but suggested that pt. Run it past ortho to prescribe. Once talking to wife, she said Dr. Ronnald Ramp had prescribed it before and he really can tell when he doesn't have the celebrex. Dr. Ronnald Ramp agreed to prescribe for 30 days, until the medication was flagged during the ordering process. It has been flagged for 2 reasons. One is the pt is allergic to Meloxicam and also he is taking Eliquis from cardiology. I explained that the celebrex puts him at an increased risk for bleeding and ulcers. Pt needs to discuss with cardiology for them to consider starting him back on the medication. Dr. Ronnald Ramp does not feel comfortable with overriding the system due to the above reason.

## 2021-11-17 NOTE — Telephone Encounter (Signed)
Mrs Paprocki states she has thought of one more thing she needs to ask, if Baxter Flattery would please give her a call back.

## 2021-11-17 NOTE — Telephone Encounter (Signed)
Copied from [place] [id number]. Topic: General - Other >> Nov 17, 2021  9:11 AM Tessa Lerner A wrote: Reason for CRM: The patient's wife has made contact requesting to speak with a member of staff when possible regarding the denial of the patient's rx request for  celecoxib (CELEBREX) 200 MG capsule [Pharmacy Med Name: Celecoxib 200 MG Oral Capsule]  Please contact further when possible

## 2021-11-17 NOTE — Progress Notes (Signed)
Called wife- can't refill celebrex due to eliquis

## 2021-11-17 NOTE — Telephone Encounter (Signed)
I spoke to wife again and verified that pt. needs to see cardiology or speak to them to get the ok to continue to celebrex. If cardio decides it is safe for him to restart the celebrex- cardio will prescribe first and then send [place] something saying it is safe for pt to take this med while on Eliquis. Pt's wife voiced understanding.

## 2021-11-18 DIAGNOSIS — I10 Essential (primary) hypertension: Secondary | ICD-10-CM | POA: Diagnosis not present

## 2021-11-23 NOTE — Telephone Encounter (Signed)
Pt called asking if Dr. Ander Purpura office has called yet about the celebrex medication.  Pt says he called the pharmacy and they have not heard anything.  CB#  [phone]

## 2021-11-30 DIAGNOSIS — C44519 Basal cell carcinoma of skin of other part of trunk: Secondary | ICD-10-CM | POA: Diagnosis not present

## 2021-12-01 NOTE — Telephone Encounter (Signed)
Pts wife called and stated that the PA amanda from the Cardiologist office was suppose to send authorization request and notes to Dr. Ronnald Ramp / they are still trying to get The [facility] for Celebrex released / please advise asap on the status of this

## 2021-12-03 DIAGNOSIS — H903 Sensorineural hearing loss, bilateral: Secondary | ICD-10-CM | POA: Diagnosis not present

## 2021-12-07 DIAGNOSIS — M5459 Other low back pain: Secondary | ICD-10-CM | POA: Diagnosis not present

## 2021-12-07 DIAGNOSIS — M2560 Stiffness of unspecified joint, not elsewhere classified: Secondary | ICD-10-CM | POA: Diagnosis not present

## 2021-12-22 DIAGNOSIS — C44519 Basal cell carcinoma of skin of other part of trunk: Secondary | ICD-10-CM | POA: Diagnosis not present

## 2022-01-03 ENCOUNTER — Other Ambulatory Visit: Payer: Self-pay

## 2022-01-03 DIAGNOSIS — I10 Essential (primary) hypertension: Secondary | ICD-10-CM

## 2022-01-03 MED ORDER — METOPROLOL SUCCINATE ER 25 MG PO TB24: 25.0000 mg | ORAL_TABLET | Freq: Every day | ORAL | 2 refills | Status: DC

## 2022-01-10 ENCOUNTER — Ambulatory Visit: Payer: Medicare PPO | Admitting: Family Medicine

## 2022-01-10 ENCOUNTER — Other Ambulatory Visit: Payer: Self-pay

## 2022-01-10 ENCOUNTER — Encounter: Payer: Self-pay | Admitting: Family Medicine

## 2022-01-10 VITALS — BP 138/80 | HR 72 | Temp 98.7°F | Ht 73.0 in | Wt 241.0 lb

## 2022-01-10 DIAGNOSIS — R051 Acute cough: Secondary | ICD-10-CM | POA: Diagnosis not present

## 2022-01-10 DIAGNOSIS — J0100 Acute maxillary sinusitis, unspecified: Secondary | ICD-10-CM

## 2022-01-10 MED ORDER — GUAIFENESIN-CODEINE 100-10 MG/5ML PO SYRP: 5.0000 mL | ORAL_SOLUTION | Freq: Three times a day (TID) | ORAL | 0 refills | Status: DC | PRN

## 2022-01-10 MED ORDER — AMOXICILLIN-POT CLAVULANATE 875-125 MG PO TABS: 1.0000 | ORAL_TABLET | Freq: Two times a day (BID) | ORAL | 0 refills | Status: DC

## 2022-01-10 NOTE — Progress Notes (Signed)
Date:  01/10/2022   Name:  [name]   DOB:  03-22-1956   MRN:  [id number]   Chief Complaint: Cough (Cough and congestion over weekend, started with a scratchy throat Thursday. Light green production, hurting under eyes some)  Cough This is a new problem. The current episode started in the past 7 days. The problem has been gradually worsening. The problem occurs constantly. Associated symptoms include chills, nasal congestion, postnasal drip, rhinorrhea, a sore throat and shortness of breath. Pertinent negatives include no chest pain, ear pain, fever, headaches, myalgias, rash or wheezing. The symptoms are aggravated by pollens. The treatment provided moderate relief. There is no history of bronchitis, environmental allergies or pneumonia.  Sinusitis This is a chronic problem. The current episode started more than 1 year ago. The problem has been gradually improving since onset. There has been no fever. Associated symptoms include chills, coughing, shortness of breath, sinus pressure and a sore throat. Pertinent negatives include no congestion, ear pain, headaches or neck pain.   Lab Results  Component Value Date   NA 143 08/30/2021   K 4.0 08/30/2021   CO2 20 08/30/2021   GLUCOSE 96 08/30/2021   BUN 12 08/30/2021   CREATININE 1.07 08/30/2021   CALCIUM 9.4 08/30/2021   EGFR 76 08/30/2021   GFRNONAA >60 08/02/2021   Lab Results  Component Value Date   CHOL 180 03/04/2021   HDL 46 03/04/2021   LDLCALC 119 (H) 03/04/2021   TRIG 80 03/04/2021   CHOLHDL 4.1 08/07/2019   Lab Results  Component Value Date   TSH 0.436 (L) 08/30/2021   Lab Results  Component Value Date   HGBA1C 6.2 (H) 08/30/2021   Lab Results  Component Value Date   WBC 6.3 10/29/2021   HGB 13.2 10/29/2021   HCT 41.0 10/29/2021   MCV 79 10/29/2021   PLT 204 10/29/2021   Lab Results  Component Value Date   ALT 24 07/27/2021   AST 23 07/27/2021   ALKPHOS 73 07/27/2021   BILITOT 1.1 07/27/2021    No results found for: 25OHVITD2, 25OHVITD3, VD25OH   Review of Systems  Constitutional:  Positive for chills. Negative for fever.  HENT:  Positive for postnasal drip, rhinorrhea, sinus pressure and sore throat. Negative for congestion, drooling, ear discharge and ear pain.   Respiratory:  Positive for cough and shortness of breath. Negative for wheezing.   Cardiovascular:  Negative for chest pain, palpitations and leg swelling.  Gastrointestinal:  Negative for abdominal pain, blood in stool, constipation, diarrhea and nausea.  Endocrine: Negative for polydipsia.  Genitourinary:  Negative for dysuria, frequency, hematuria and urgency.  Musculoskeletal:  Negative for back pain, myalgias and neck pain.  Skin:  Negative for rash.  Allergic/Immunologic: Negative for environmental allergies.  Neurological:  Negative for dizziness and headaches.  Hematological:  Does not bruise/bleed easily.  Psychiatric/Behavioral:  Negative for suicidal ideas. The patient is not nervous/anxious.    Patient Active Problem List   Diagnosis Date Noted   Hypokalemia 08/24/2021   Bacteremia due to Gram-negative bacteria    AF (paroxysmal atrial fibrillation) (HCC)    SIRS (systemic inflammatory response syndrome) (HCC) 07/27/2021   Sepsis secondary to UTI (HCC) 07/27/2021   Ureteral stricture 06/18/2021   Sacroiliitis (HCC) 05/07/2021   Spinal stenosis of lumbosacral region 01/18/2021   Herniated nucleus pulposus, lumbar 03/16/2020   Body mass index (BMI) 33.0-33.9, adult 09/03/2019   Other intervertebral disc displacement, lumbar region 06/25/2019   HNP (herniated nucleus  pulposus), lumbar 06/12/2019   UTI due to Klebsiella species 05/08/2019   Biceps tendonitis on right 07/27/2018   Spinal stenosis, 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   Lumbago with sciatica, unspecified side 11/14/2017   Primary osteoarthritis of right knee 10/12/2017   Taking multiple medications for chronic  disease 07/30/2015   Bronchitis 07/01/2015   Reactive airway disease 07/01/2015   Allergic sinusitis 07/01/2015   Essential hypertension 07/01/2015   Valvular heart disease 07/01/2015   Metatarsalgia of left foot 06/10/2015   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    Allergies  Allergen Reactions   Meloxicam Palpitations   Sulfa Antibiotics Rash    Past Surgical History:  Procedure Laterality Date   ABDOMINAL EXPOSURE N/A 01/18/2021   Procedure: ABDOMINAL EXPOSURE;  Surgeon: Cephus Shelling, MD;  Location: [facility] OR;  Service: Vascular;  Laterality: N/A;   ANTERIOR LUMBAR FUSION N/A 01/18/2021   Procedure: Anterior Lumbar Interbody Fusion - Lumbar Five-Sarcal One;  Surgeon: Donalee Citrin, MD;  Location: [facility] OR;  Service: Neurosurgery;  Laterality: N/A;  Anterior Lumbar Interbody Fusion - Lumbar Five-Sacral One   CYSTOSCOPY W/ URETERAL STENT PLACEMENT Left 06/18/2021   Procedure: CYSTOSCOPY WITH RETROGRADE PYELOGRAM/URETERAL STENT EXCHANGE;  Surgeon: Sebastian Ache, MD;  Location: [facility];  Service: Urology;  Laterality: Left;   CYSTOSCOPY WITH RETROGRADE PYELOGRAM, URETEROSCOPY AND STENT PLACEMENT Left 04/21/2021   Procedure: CYSTOSCOPY WITH RETROGRADE PYELOGRAM, DIAGNOSTICURETEROSCOPY AND STENT PLACEMENT;  Surgeon: Sebastian Ache, MD;  Location: [facility];  Service: Urology;  Laterality: Left;  1 HR   EXTRACORPOREAL SHOCK WAVE LITHOTRIPSY     approx 2002   LUMBAR DISC SURGERY  2018   REPAIR DURAL / CSF LEAK  2018   post op lumbar diskectomy   REVISION TOTAL KNEE ARTHROPLASTY Left 2015   TOTAL KNEE ARTHROPLASTY Left 2012   TRANSFORAMINAL LUMBAR INTERBODY FUSION (TLIF) WITH PEDICLE SCREW FIXATION 1 LEVEL N/A 06/12/2019   Procedure: LUMBAR FOUR-FIVE TRANSFORAMINAL LUMBAR INTERBODY FUSION (TLIF) WITH PEDICLE SCREW FIXATION;  Surgeon: Donalee Citrin, MD;  Location: [facility] OR;  Service: Neurosurgery;  Laterality: N/A;   UMBILICAL HERNIA REPAIR  1992    Social History   Tobacco Use    Smoking status: Never   Smokeless tobacco: Former    Types: Chew    Quit date: 03/2020  Vaping Use   Vaping Use: Never used  Substance Use Topics   Alcohol use: Yes    Comment: ocassional   Drug use: Never     Medication list has been reviewed and updated.  Current Meds  Medication Sig   Acetaminophen (TYLENOL PO) Take by mouth as needed.   apixaban (ELIQUIS) 5 MG TABS tablet Take 5 mg by mouth 2 (two) times daily.   diltiazem (CARDIZEM CD) 180 MG 24 hr capsule Take 1 capsule (180 mg total) by mouth 2 (two) times daily.   ferrous sulfate 325 (65 FE) MG tablet Take 1 tablet by mouth daily with breakfast.   fexofenadine (ALLEGRA) 180 MG tablet Take 1 tablet (180 mg total) by mouth daily.   fluticasone (FLONASE) 50 MCG/ACT nasal spray Place 2 sprays into both nostrils daily.   hydrochlorothiazide (HYDRODIURIL) 12.5 MG tablet Take 1 tablet (12.5 mg total) by mouth daily.   lisinopril (ZESTRIL) 20 MG tablet Take 1 tablet (20 mg total) by mouth at bedtime.   metoprolol succinate (TOPROL-XL) 25 MG 24 hr tablet Take 1 tablet (25 mg total) by mouth daily.   montelukast (SINGULAIR) 10 MG tablet Take 1 tablet (10 mg  total) by mouth at bedtime.   Multiple Vitamins-Minerals (MULTIVITAMIN MEN) TABS Take by mouth.   Olopatadine HCl 0.2 % SOLN Place 1 drop into both eyes 2 (two) times daily as needed (allergies.).   pantoprazole (PROTONIX) 40 MG tablet Take 1 tablet (40 mg total) by mouth every evening.   potassium chloride SA (KLOR-CON) 20 MEQ tablet Take 1-2 tablets (20-40 mEq total) by mouth 2 (two) times daily. Takes one tablet in am and two tabs in pm   tadalafil (CIALIS) 5 MG tablet Take 1-2 tablets (5-10 mg total) by mouth daily as needed for erectile dysfunction.   tamsulosin (FLOMAX) 0.4 MG CAPS capsule Take 1 capsule (0.4 mg total) by mouth daily.    PHQ 2/9 Scores 09/20/2021 09/20/2021 09/03/2021 07/27/2021  PHQ - 2 Score 0 0 0 0  PHQ- 9 Score 0 4 3 0    GAD 7 : Generalized Anxiety  Score 09/20/2021 09/03/2021 07/27/2021 07/02/2021  Nervous, Anxious, on Edge 0 0 0 0  Control/stop worrying 0 0 0 0  Worry too much - different things 0 0 0 0  Trouble relaxing 0 0 0 0  Restless 0 0 0 0  Easily annoyed or irritable 0 0 0 0  Afraid - awful might happen 0 0 0 0  Total GAD 7 Score 0 0 0 0  Anxiety Difficulty Not difficult at all Not difficult at all Not difficult at all -    BP Readings from Last 3 Encounters:  01/10/22 138/80  10/12/21 (!) 170/99  10/03/21 (!) 141/83    Physical Exam Vitals and nursing note reviewed.  HENT:     Head: Normocephalic.     Right Ear: Tympanic membrane, ear canal and external ear normal.     Left Ear: Tympanic membrane, ear canal and external ear normal.     Nose:     Right Turbinates: Swollen.     Left Turbinates: Swollen.     Right Sinus: Maxillary sinus tenderness present.     Left Sinus: Maxillary sinus tenderness present.  Eyes:     General: No scleral icterus.       Right eye: No discharge.        Left eye: No discharge.     Conjunctiva/sclera: Conjunctivae normal.     Pupils: Pupils are equal, round, and reactive to light.  Neck:     Thyroid: No thyromegaly.     Vascular: No JVD.     Trachea: No tracheal deviation.  Cardiovascular:     Rate and Rhythm: Normal rate and regular rhythm.     Heart sounds: Normal heart sounds. No murmur heard.   No friction rub. No gallop.  Pulmonary:     Effort: No respiratory distress.     Breath sounds: Normal breath sounds. No wheezing or rales.  Abdominal:     General: Bowel sounds are normal.     Palpations: Abdomen is soft. There is no mass.     Tenderness: There is no abdominal tenderness. There is no guarding or rebound.  Musculoskeletal:        General: No tenderness. Normal range of motion.     Cervical back: Normal range of motion and neck supple.  Lymphadenopathy:     Cervical: No cervical adenopathy.  Skin:    General: Skin is warm.     Findings: No rash.  Neurological:      Mental Status: He is alert and oriented to person, place, and time.     Cranial  Nerves: No cranial nerve deficit.     Deep Tendon Reflexes: Reflexes are normal and symmetric.    Wt Readings from Last 3 Encounters:  01/10/22 241 lb (109.3 kg)  10/12/21 250 lb (113.4 kg)  10/03/21 250 lb (113.4 kg)    BP 138/80   Pulse 72   Temp 98.7 F (37.1 C) (Oral)   Ht 6\' 1"  (1.854 m)   Wt 241 lb (109.3 kg)   SpO2 97%   BMI 31.80 kg/m   Assessment and Plan:  1. Acute maxillary sinusitis, recurrence not specified Acute.  Persistent.  Stable.  History of present illness and exam is consistent with an acute maxillary sinusitis.  We will treat with Augmentin 875 mg twice a day. - amoxicillin-clavulanate (AUGMENTIN) 875-125 MG tablet; Take 1 tablet by mouth 2 (two) times daily.  Dispense: 20 tablet; Refill: 0  2. Acute cough Acute.  Persistent.  Bothersome primarily during the night.  We will treat with Robitussin-AC teaspoon 3 times a day as needed for cough - guaiFENesin-codeine (ROBITUSSIN AC) 100-10 MG/5ML syrup; Take 5 mLs by mouth 3 (three) times daily as needed for cough.  Dispense: 118 mL; Refill: 0

## 2022-01-18 DIAGNOSIS — C44729 Squamous cell carcinoma of skin of left lower limb, including hip: Secondary | ICD-10-CM | POA: Diagnosis not present

## 2022-01-18 DIAGNOSIS — D0472 Carcinoma in situ of skin of left lower limb, including hip: Secondary | ICD-10-CM | POA: Diagnosis not present

## 2022-01-18 DIAGNOSIS — D2272 Melanocytic nevi of left lower limb, including hip: Secondary | ICD-10-CM | POA: Diagnosis not present

## 2022-02-02 DIAGNOSIS — S40011D Contusion of right shoulder, subsequent encounter: Secondary | ICD-10-CM | POA: Diagnosis not present

## 2022-02-02 DIAGNOSIS — T1490XA Injury, unspecified, initial encounter: Secondary | ICD-10-CM | POA: Diagnosis not present

## 2022-02-20 ENCOUNTER — Other Ambulatory Visit: Payer: Self-pay

## 2022-02-20 ENCOUNTER — Ambulatory Visit
Admission: EM | Admit: 2022-02-20 | Discharge: 2022-02-20 | Disposition: A | Discharge status: Home / Self Care | Payer: Medicare PPO | Attending: Physician Assistant | Admitting: Physician Assistant

## 2022-02-20 DIAGNOSIS — J209 Acute bronchitis, unspecified: Secondary | ICD-10-CM | POA: Insufficient documentation

## 2022-02-20 DIAGNOSIS — Z20822 Contact with and (suspected) exposure to covid-19: Secondary | ICD-10-CM | POA: Insufficient documentation

## 2022-02-20 DIAGNOSIS — J029 Acute pharyngitis, unspecified: Secondary | ICD-10-CM | POA: Diagnosis present

## 2022-02-20 DIAGNOSIS — H109 Unspecified conjunctivitis: Secondary | ICD-10-CM | POA: Insufficient documentation

## 2022-02-20 LAB — RESP PANEL BY RT-PCR (FLU A&B, COVID) ARPGX2
Influenza A by PCR: NEGATIVE
Influenza B by PCR: NEGATIVE
SARS Coronavirus 2 by RT PCR: NEGATIVE

## 2022-02-20 LAB — GROUP A STREP BY PCR: Group A Strep by PCR: NOT DETECTED

## 2022-02-20 MED ORDER — PREDNISONE 10 MG (21) PO TBPK: ORAL_TABLET | ORAL | 0 refills | Status: DC

## 2022-02-20 MED ORDER — AZITHROMYCIN 250 MG PO TABS: ORAL_TABLET | ORAL | 0 refills | Status: DC

## 2022-02-20 NOTE — ED Triage Notes (Signed)
Patient C/O sore throat, coughing, nasal congestion, as well as pain, irritation, and drainage in left eye. Patient also complains of having chills, has not taken temp. This started on Thursday of last week. Patient states he took a covid test at home last night and it was negative.  ?

## 2022-02-20 NOTE — ED Provider Notes (Signed)
? ? ?Provider Note ? ?Patient Contact: 11:44 AM (approximate) ? ? ?History  ? ?Sore Throat, Nasal Congestion, Conjunctivitis (Left Eye), Fever, and Cough ? ? ?HPI ? ?[name] is a 66 y.o. male presents to the emergency department with 5 to 6 days of cough productive for purulent sputum production, pharyngitis and unilateral conjunctivitis on the left.  Patient recently returned from a large conference and has numerous potential sick contacts.  No chest pain, chest tightness or shortness of breath. ? ?  ? ? ?Physical Exam  ? ?Triage Vital Signs: ?ED Triage Vitals  ?Enc Vitals Group  ?   BP 02/20/22 1111 (!) 171/104  ?   Pulse Rate 02/20/22 1111 67  ?   Resp 02/20/22 1111 15  ?   Temp 02/20/22 1111 98.2 ?F (36.8 ?C)  ?   Temp Source 02/20/22 1111 Oral  ?   SpO2 02/20/22 1111 98 %  ?   Weight 02/20/22 1112 240 lb (108.9 kg)  ?   Height 02/20/22 1112 '6\' 1"'$  (1.854 m)  ?   Head Circumference --   ?   Peak Flow --   ?   Pain Score 02/20/22 1112 0  ?   Pain Loc --   ?   Pain Edu? --   ?   Excl. in [place]? --   ? ? ?Most recent vital signs: ?Vitals:  ? 02/20/22 1111  ?BP: (!) 171/104  ?Pulse: 67  ?Resp: 15  ?Temp: 98.2 ?F (36.8 ?C)  ?SpO2: 98%  ? ? ? ?General: Alert and in no acute distress. ?Eyes:  PERRL. EOMI. Patient has unilateral conjunctivitis on the left. ?Head: No acute traumatic findings ?ENT: ?     Nose: No congestion/rhinnorhea. ?     Mouth/Throat: Mucous membranes are moist.  Posterior pharynx is erythematous. ?Neck: No stridor. No cervical spine tenderness to palpation. ?Cardiovascular:  Good peripheral perfusion ?Respiratory: Normal respiratory effort without tachypnea or retractions. Lungs CTAB. Good air entry to the bases with no decreased or absent breath sounds. ?Gastrointestinal: Bowel sounds ?4 quadrants. Soft and nontender to palpation. No guarding or rigidity. No palpable masses. No distention. No CVA tenderness. ?Musculoskeletal: Full range of motion to all extremities.  ?Neurologic:  No gross  focal neurologic deficits are appreciated.  ?Skin:   No rash noted ? ? ? ?ED Results / Procedures / Treatments  ? ?Labs ?(all labs ordered are listed, but only abnormal results are displayed) ?Labs Reviewed  ?GROUP A STREP BY PCR  ?RESP PANEL BY RT-PCR (FLU A&B, COVID) [id number]  ? ? ? ? ? ? ? ? ? ?PROCEDURES: ? ?Critical Care performed: No ? ?Procedures ? ? ?MEDICATIONS ORDERED IN ED: ?Medications - No data to display ? ? ?IMPRESSION / MDM / ASSESSMENT AND PLAN / ED COURSE  ?I reviewed the triage vital signs and the nursing notes. ?             ?               ?Assessment and plan ?Cough ?Differential diagnosis includes, but is not limited to, bronchitis, group A strep... ?66 year old male presents to the urgent care with viral URI-like symptoms that started last Thursday.  Patient does have a history of recurrent bronchitis. ? ?Patient was hypertensive at triage but vital signs otherwise reassuring.  He tested negative for group A strep.  COVID-19 and influenza testing are in process at this time.  Will cover patient with azithromycin and prednisone for early post  viral pneumonia and have patient follow-up with primary care as needed.  Return precautions were given to return with new or worsening symptoms. ?  ? ? ?FINAL CLINICAL IMPRESSION(S) / ED DIAGNOSES  ? ?Final diagnoses:  ?Acute bronchitis, unspecified organism  ? ? ? ?Rx / DC Orders  ? ?ED Discharge Orders   ? ?      Ordered  ?  azithromycin (ZITHROMAX Z-PAK) 250 MG tablet       ? 02/20/22 1159  ?  predniSONE (STERAPRED UNI-PAK 21 TAB) 10 MG (21) TBPK tablet       ? 02/20/22 1159  ? ?  ?  ? ?  ? ? ? ?Note:  This document was prepared using Dragon voice recognition software and may include unintentional dictation errors. ?  ?Lannie Fields, PA-C ?02/20/22 1207 ? ?

## 2022-02-20 NOTE — Discharge Instructions (Signed)
Take prednisone and azithromycin as directed. ?

## 2022-02-21 ENCOUNTER — Ambulatory Visit: Payer: Medicare PPO | Admitting: Family Medicine

## 2022-02-21 ENCOUNTER — Encounter: Payer: Self-pay | Admitting: Family Medicine

## 2022-02-21 VITALS — BP 140/102 | HR 88 | Ht 73.0 in | Wt 242.0 lb

## 2022-02-21 DIAGNOSIS — I38 Endocarditis, valve unspecified: Secondary | ICD-10-CM | POA: Diagnosis not present

## 2022-02-21 DIAGNOSIS — E876 Hypokalemia: Secondary | ICD-10-CM

## 2022-02-21 DIAGNOSIS — E7801 Familial hypercholesterolemia: Secondary | ICD-10-CM | POA: Diagnosis not present

## 2022-02-21 DIAGNOSIS — J302 Other seasonal allergic rhinitis: Secondary | ICD-10-CM

## 2022-02-21 DIAGNOSIS — R7303 Prediabetes: Secondary | ICD-10-CM | POA: Diagnosis not present

## 2022-02-21 DIAGNOSIS — K219 Gastro-esophageal reflux disease without esophagitis: Secondary | ICD-10-CM | POA: Diagnosis not present

## 2022-02-21 DIAGNOSIS — I10 Essential (primary) hypertension: Secondary | ICD-10-CM

## 2022-02-21 DIAGNOSIS — R601 Generalized edema: Secondary | ICD-10-CM

## 2022-02-21 DIAGNOSIS — R7989 Other specified abnormal findings of blood chemistry: Secondary | ICD-10-CM

## 2022-02-21 MED ORDER — MONTELUKAST SODIUM 10 MG PO TABS: 10.0000 mg | ORAL_TABLET | Freq: Every day | ORAL | 1 refills | Status: DC

## 2022-02-21 MED ORDER — FEXOFENADINE HCL 180 MG PO TABS: 180.0000 mg | ORAL_TABLET | Freq: Every day | ORAL | 1 refills | Status: DC

## 2022-02-21 MED ORDER — POTASSIUM CHLORIDE CRYS ER 20 MEQ PO TBCR: 20.0000 meq | EXTENDED_RELEASE_TABLET | Freq: Two times a day (BID) | ORAL | 1 refills | Status: DC

## 2022-02-21 MED ORDER — VALSARTAN 320 MG PO TABS: 320.0000 mg | ORAL_TABLET | Freq: Every day | ORAL | 1 refills | Status: DC

## 2022-02-21 MED ORDER — METOPROLOL SUCCINATE ER 25 MG PO TB24: 25.0000 mg | ORAL_TABLET | Freq: Every day | ORAL | 1 refills | Status: DC

## 2022-02-21 MED ORDER — HYDROCHLOROTHIAZIDE 12.5 MG PO TABS: 12.5000 mg | ORAL_TABLET | Freq: Every day | ORAL | 1 refills | Status: DC

## 2022-02-21 MED ORDER — DILTIAZEM HCL ER COATED BEADS 180 MG PO CP24: 180.0000 mg | ORAL_CAPSULE | Freq: Two times a day (BID) | ORAL | 1 refills | Status: DC

## 2022-02-21 MED ORDER — PANTOPRAZOLE SODIUM 40 MG PO TBEC: 40.0000 mg | DELAYED_RELEASE_TABLET | Freq: Every evening | ORAL | 1 refills | Status: DC

## 2022-02-21 NOTE — Progress Notes (Signed)
? ? ?Date:  02/21/2022  ? ?Name:  [name]   DOB:  11-22-1955   MRN:  [id number] ? ? ?Chief Complaint: Prediabetes, Hypothyroidism, hypokalemia, Gastroesophageal Reflux, Allergic Rhinitis , and Hypertension ? ?Gastroesophageal Reflux ?He reports no abdominal pain, no chest pain, no coughing, no dysphagia, no nausea, no sore throat or no wheezing. This is a recurrent problem. The current episode started more than 1 year ago. The problem has been gradually improving. The symptoms are aggravated by certain foods. Pertinent negatives include no anemia, fatigue, melena, muscle weakness, orthopnea or weight loss. He has tried a PPI for the symptoms. The treatment provided mild relief.  ?Hypertension ?This is a chronic problem. The current episode started more than 1 year ago. The problem has been gradually worsening since onset. The problem is controlled. Pertinent negatives include no anxiety, blurred vision, chest pain, headaches, malaise/fatigue, neck pain, orthopnea, palpitations, peripheral edema, PND, shortness of breath or sweats. There are no associated agents to hypertension. Risk factors for coronary artery disease include dyslipidemia. Past treatments include ACE inhibitors, diuretics, beta blockers and calcium channel blockers. The current treatment provides moderate improvement. There are no compliance problems.  There is no history of angina, kidney disease, CAD/MI, CVA, heart failure, left ventricular hypertrophy, PVD or retinopathy. There is no history of chronic renal disease, a hypertension causing med or renovascular disease.  ? ?Lab Results  ?Component Value Date  ? NA 143 08/30/2021  ? K 4.0 08/30/2021  ? CO2 20 08/30/2021  ? GLUCOSE 96 08/30/2021  ? BUN 12 08/30/2021  ? CREATININE 1.07 08/30/2021  ? CALCIUM 9.4 08/30/2021  ? EGFR 76 08/30/2021  ? GFRNONAA >60 08/02/2021  ? ?Lab Results  ?Component Value Date  ? CHOL 180 03/04/2021  ? HDL 46 03/04/2021  ? LDLCALC 119 (H) 03/04/2021  ? TRIG 80  03/04/2021  ? CHOLHDL 4.1 08/07/2019  ? ?Lab Results  ?Component Value Date  ? TSH 0.436 (L) 08/30/2021  ? ?Lab Results  ?Component Value Date  ? HGBA1C 6.2 (H) 08/30/2021  ? ?Lab Results  ?Component Value Date  ? WBC 6.3 10/29/2021  ? HGB 13.2 10/29/2021  ? HCT 41.0 10/29/2021  ? MCV 79 10/29/2021  ? PLT 204 10/29/2021  ? ?Lab Results  ?Component Value Date  ? ALT 24 07/27/2021  ? AST 23 07/27/2021  ? ALKPHOS 73 07/27/2021  ? BILITOT 1.1 07/27/2021  ? ?No results found for: 25OHVITD2, [place], VD25OH  ? ?Review of Systems  ?Constitutional:  Negative for chills, fatigue, fever, malaise/fatigue and weight loss.  ?HENT:  Negative for drooling, ear discharge, ear pain and sore throat.   ?Eyes:  Negative for blurred vision.  ?Respiratory:  Negative for cough, shortness of breath and wheezing.   ?Cardiovascular:  Negative for chest pain, palpitations, orthopnea, leg swelling and PND.  ?Gastrointestinal:  Negative for abdominal pain, blood in stool, constipation, diarrhea, dysphagia, melena and nausea.  ?Endocrine: Negative for polydipsia.  ?Genitourinary:  Negative for dysuria, frequency, hematuria and urgency.  ?Musculoskeletal:  Negative for back pain, myalgias, muscle weakness and neck pain.  ?Skin:  Negative for rash.  ?Allergic/Immunologic: Negative for environmental allergies.  ?Neurological:  Negative for dizziness and headaches.  ?Hematological:  Does not bruise/bleed easily.  ?Psychiatric/Behavioral:  Negative for suicidal ideas. The patient is not nervous/anxious.   ? ?Patient Active Problem List  ? Diagnosis Date Noted  ? Hypokalemia 08/24/2021  ? Bacteremia due to Gram-negative bacteria   ? AF (paroxysmal atrial fibrillation) ([place])   ?  SIRS (systemic inflammatory response syndrome) ([place]) 07/27/2021  ? Sepsis secondary to UTI ([place]) 07/27/2021  ? Ureteral stricture 06/18/2021  ? Sacroiliitis ([place]) 05/07/2021  ? Spinal stenosis of lumbosacral region 01/18/2021  ? Herniated nucleus pulposus, lumbar 03/16/2020   ? Body mass index (BMI) 33.0-33.9, adult 09/03/2019  ? Other intervertebral disc displacement, lumbar region 06/25/2019  ? HNP (herniated nucleus pulposus), lumbar 06/12/2019  ? UTI due to Klebsiella species 05/08/2019  ? Biceps tendonitis on right 07/27/2018  ? Spinal stenosis, 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  ? Lumbago with sciatica, unspecified side 11/14/2017  ? Primary osteoarthritis of right knee 10/12/2017  ? Taking multiple medications for chronic disease 07/30/2015  ? Bronchitis 07/01/2015  ? Reactive airway disease 07/01/2015  ? Allergic sinusitis 07/01/2015  ? Essential hypertension 07/01/2015  ? Valvular heart disease 07/01/2015  ? Metatarsalgia of left foot 06/10/2015  ? [place] arthritis 12/15/2013  ? ? ?Allergies  ?Allergen Reactions  ? Meloxicam Palpitations  ? Sulfa Antibiotics Rash  ? ? ?Past Surgical History:  ?Procedure Laterality Date  ? ABDOMINAL EXPOSURE N/A 01/18/2021  ? Procedure: ABDOMINAL EXPOSURE;  Surgeon: [name], MD;  Location: [place];  Service: Vascular;  Laterality: N/A;  ? ANTERIOR LUMBAR FUSION N/A 01/18/2021  ? Procedure: Anterior Lumbar Interbody Fusion - Lumbar Five-Sarcal One;  Surgeon: Kary Kos, MD;  Location: [place];  Service: Neurosurgery;  Laterality: N/A;  Anterior Lumbar Interbody Fusion - Lumbar Five-Sacral One  ? CYSTOSCOPY W/ URETERAL STENT PLACEMENT Left 06/18/2021  ? Procedure: CYSTOSCOPY WITH RETROGRADE PYELOGRAM/URETERAL STENT EXCHANGE;  Surgeon: Alexis Frock, MD;  Location: [facility];  Service: Urology;  Laterality: Left;  ? CYSTOSCOPY WITH RETROGRADE PYELOGRAM, URETEROSCOPY AND STENT PLACEMENT Left 04/21/2021  ? Procedure: CYSTOSCOPY WITH RETROGRADE PYELOGRAM, DIAGNOSTICURETEROSCOPY AND STENT PLACEMENT;  Surgeon: Alexis Frock, MD;  Location: [facility];  Service: Urology;  Laterality: Left;  1 HR  ? EXTRACORPOREAL SHOCK WAVE LITHOTRIPSY    ? approx 2002  ? [place] SURGERY  2018  ? REPAIR DURAL / CSF LEAK  2018   ? post op lumbar diskectomy  ? REVISION TOTAL KNEE ARTHROPLASTY Left 2015  ? TOTAL KNEE ARTHROPLASTY Left 2012  ? TRANSFORAMINAL LUMBAR INTERBODY FUSION (TLIF) WITH PEDICLE SCREW FIXATION 1 LEVEL N/A 06/12/2019  ? Procedure: LUMBAR FOUR-FIVE TRANSFORAMINAL LUMBAR INTERBODY FUSION (TLIF) WITH PEDICLE SCREW FIXATION;  Surgeon: Kary Kos, MD;  Location: [place];  Service: Neurosurgery;  Laterality: N/A;  ? [place]  ? ? ?Social History  ? ?Tobacco Use  ? Smoking status: Never  ? Smokeless tobacco: Former  ?  Types: Chew  ?  Quit date: 03/2020  ?Vaping Use  ? Vaping Use: Never used  ?Substance Use Topics  ? Alcohol use: Yes  ?  Comment: ocassional  ? Drug use: Never  ? ? ? ?Medication list has been reviewed and updated. ? ?Current Meds  ?Medication Sig  ? Acetaminophen (TYLENOL PO) Take by mouth as needed.  ? apixaban (ELIQUIS) 5 MG TABS tablet Take 5 mg by mouth 2 (two) times daily.  ? azithromycin (ZITHROMAX Z-PAK) 250 MG tablet Take 2 tablets the first day.  Take 1 tablet on days 2 through 5.  ? diltiazem (CARDIZEM CD) 180 MG 24 hr capsule Take 1 capsule (180 mg total) by mouth 2 (two) times daily.  ? ferrous sulfate 325 (65 FE) MG tablet Take 1 tablet by mouth daily with breakfast.  ? fexofenadine (ALLEGRA) 180 MG tablet Take 1  tablet (180 mg total) by mouth daily.  ? fluticasone (FLONASE) 50 MCG/ACT nasal spray Place 2 sprays into both nostrils daily.  ? hydrochlorothiazide (HYDRODIURIL) 12.5 MG tablet Take 1 tablet (12.5 mg total) by mouth daily.  ? lisinopril (ZESTRIL) 20 MG tablet Take 1 tablet (20 mg total) by mouth at bedtime.  ? metoprolol succinate (TOPROL-XL) 25 MG 24 hr tablet Take 1 tablet (25 mg total) by mouth daily.  ? montelukast (SINGULAIR) 10 MG tablet Take 1 tablet (10 mg total) by mouth at bedtime.  ? Multiple Vitamins-Minerals (MULTIVITAMIN MEN) TABS Take by mouth.  ? Olopatadine HCl 0.2 % SOLN Place 1 drop into both eyes 2 (two) times daily as needed (allergies.).  ?  pantoprazole (PROTONIX) 40 MG tablet Take 1 tablet (40 mg total) by mouth every evening.  ? potassium chloride SA (KLOR-CON) 20 MEQ tablet Take 1-2 tablets (20-40 mEq total) by mouth 2 (two) times daily. Takes

## 2022-02-22 ENCOUNTER — Other Ambulatory Visit: Payer: Self-pay

## 2022-02-22 DIAGNOSIS — E782 Mixed hyperlipidemia: Secondary | ICD-10-CM

## 2022-02-22 LAB — COMPREHENSIVE METABOLIC PANEL
ALT: 17 IU/L (ref 0–44)
AST: 13 IU/L (ref 0–40)
Albumin/Globulin Ratio: 1.3 (ref 1.2–2.2)
Albumin: 4.2 g/dL (ref 3.8–4.8)
Alkaline Phosphatase: 111 IU/L (ref 44–121)
BUN/Creatinine Ratio: 16 (ref 10–24)
BUN: 16 mg/dL (ref 8–27)
Bilirubin Total: 0.4 mg/dL (ref 0.0–1.2)
CO2: 24 mmol/L (ref 20–29)
Calcium: 9.3 mg/dL (ref 8.6–10.2)
Chloride: 104 mmol/L (ref 96–106)
Creatinine, Ser: 0.98 mg/dL (ref 0.76–1.27)
Globulin, Total: 3.3 g/dL (ref 1.5–4.5)
Glucose: 140 mg/dL — ABNORMAL HIGH (ref 70–99)
Potassium: 3.9 mmol/L (ref 3.5–5.2)
Sodium: 144 mmol/L (ref 134–144)
Total Protein: 7.5 g/dL (ref 6.0–8.5)
eGFR: 85 mL/min/{1.73_m2} (ref >59)

## 2022-02-22 LAB — LIPID PANEL WITH LDL/HDL RATIO
Cholesterol, Total: 212 mg/dL — ABNORMAL HIGH (ref 100–199)
HDL: 57 mg/dL (ref >39)
LDL Chol Calc (NIH): 145 mg/dL — ABNORMAL HIGH (ref 0–99)
LDL/HDL Ratio: 2.5 ratio (ref 0.0–3.6)
Triglycerides: 55 mg/dL (ref 0–149)
VLDL Cholesterol Cal: 10 mg/dL (ref 5–40)

## 2022-02-22 LAB — TSH: TSH: 0.569 u[IU]/mL (ref 0.450–4.500)

## 2022-02-22 LAB — HEMOGLOBIN A1C
Est. average glucose Bld gHb Est-mCnc: 131 mg/dL
Hgb A1c MFr Bld: 6.2 % — ABNORMAL HIGH (ref 4.8–5.6)

## 2022-02-22 MED ORDER — ROSUVASTATIN CALCIUM 5 MG PO TABS: 5.0000 mg | ORAL_TABLET | ORAL | 1 refills | Status: DC

## 2022-02-22 NOTE — Progress Notes (Signed)
Sent in [facility].  

## 2022-03-22 ENCOUNTER — Encounter: Payer: Self-pay | Admitting: Family Medicine

## 2022-03-22 ENCOUNTER — Ambulatory Visit: Payer: Medicare PPO | Admitting: Family Medicine

## 2022-03-22 VITALS — BP 138/80 | HR 84 | Ht 73.0 in | Wt 240.0 lb

## 2022-03-22 DIAGNOSIS — I10 Essential (primary) hypertension: Secondary | ICD-10-CM | POA: Diagnosis not present

## 2022-03-22 DIAGNOSIS — E782 Mixed hyperlipidemia: Secondary | ICD-10-CM | POA: Diagnosis not present

## 2022-03-22 DIAGNOSIS — I480 Paroxysmal atrial fibrillation: Secondary | ICD-10-CM | POA: Diagnosis not present

## 2022-03-22 DIAGNOSIS — C44329 Squamous cell carcinoma of skin of other parts of face: Secondary | ICD-10-CM | POA: Diagnosis not present

## 2022-03-22 DIAGNOSIS — D485 Neoplasm of uncertain behavior of skin: Secondary | ICD-10-CM | POA: Diagnosis not present

## 2022-03-22 DIAGNOSIS — Z79899 Other long term (current) drug therapy: Secondary | ICD-10-CM | POA: Diagnosis not present

## 2022-03-22 DIAGNOSIS — Z5181 Encounter for therapeutic drug level monitoring: Secondary | ICD-10-CM | POA: Diagnosis not present

## 2022-03-22 NOTE — Progress Notes (Signed)
Date:  03/22/2022   Name:  [name]   DOB:  Mar 19, 1956   MRN:  [id number]   Chief Complaint: Hypertension (Follow up- stopped lisinopril and added valsartan)  Hypertension This is a chronic problem. The current episode started more than 1 year ago. The problem has been gradually improving since onset. The problem is controlled. Pertinent negatives include no blurred vision, chest pain, headaches, neck pain, palpitations, peripheral edema, PND or shortness of breath. There are no associated agents to hypertension. Risk factors for coronary artery disease include dyslipidemia. Past treatments include beta blockers and diuretics. The current treatment provides moderate improvement. There are no compliance problems.  There is no history of angina, kidney disease, CAD/MI, CVA, heart failure, left ventricular hypertrophy, PVD or retinopathy. There is no history of chronic renal disease, a hypertension causing med or renovascular disease.   Lab Results  Component Value Date   NA 144 02/21/2022   K 3.9 02/21/2022   CO2 24 02/21/2022   GLUCOSE 140 (H) 02/21/2022   BUN 16 02/21/2022   CREATININE 0.98 02/21/2022   CALCIUM 9.3 02/21/2022   EGFR 85 02/21/2022   GFRNONAA >60 08/02/2021   Lab Results  Component Value Date   CHOL 212 (H) 02/21/2022   HDL 57 02/21/2022   LDLCALC 145 (H) 02/21/2022   TRIG 55 02/21/2022   CHOLHDL 4.1 08/07/2019   Lab Results  Component Value Date   TSH 0.569 02/21/2022   Lab Results  Component Value Date   HGBA1C 6.2 (H) 02/21/2022   Lab Results  Component Value Date   WBC 6.3 10/29/2021   HGB 13.2 10/29/2021   HCT 41.0 10/29/2021   MCV 79 10/29/2021   PLT 204 10/29/2021   Lab Results  Component Value Date   ALT 17 02/21/2022   AST 13 02/21/2022   ALKPHOS 111 02/21/2022   BILITOT 0.4 02/21/2022   No results found for: 25OHVITD2, 25OHVITD3, VD25OH   Review of Systems  Constitutional:  Negative for chills and fever.  HENT:  Negative  for drooling, ear discharge, ear pain and sore throat.   Eyes:  Negative for blurred vision.  Respiratory:  Negative for cough, shortness of breath and wheezing.   Cardiovascular:  Negative for chest pain, palpitations, leg swelling and PND.  Gastrointestinal:  Negative for abdominal pain, blood in stool, constipation, diarrhea and nausea.  Endocrine: Negative for polydipsia.  Genitourinary:  Negative for dysuria, frequency, hematuria and urgency.  Musculoskeletal:  Negative for back pain, myalgias and neck pain.  Skin:  Negative for rash.  Allergic/Immunologic: Negative for environmental allergies.  Neurological:  Negative for dizziness and headaches.  Hematological:  Does not bruise/bleed easily.  Psychiatric/Behavioral:  Negative for suicidal ideas. The patient is not nervous/anxious.    Patient Active Problem List   Diagnosis Date Noted   Hypokalemia 08/24/2021   Bacteremia due to Gram-negative bacteria    AF (paroxysmal atrial fibrillation) (HCC)    SIRS (systemic inflammatory response syndrome) ([place]) 07/27/2021   Sepsis secondary to UTI ([place]) 07/27/2021   Ureteral stricture 06/18/2021   Sacroiliitis ([place]) 05/07/2021   Spinal stenosis of lumbosacral region 01/18/2021   Herniated nucleus pulposus, lumbar 03/16/2020   Body mass index (BMI) 33.0-33.9, adult 09/03/2019   Other intervertebral disc displacement, lumbar region 06/25/2019   HNP (herniated nucleus pulposus), lumbar 06/12/2019   UTI due to Klebsiella species 05/08/2019   Biceps tendonitis on right 07/27/2018   Spinal stenosis, 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   Lumbago  with sciatica, unspecified side 11/14/2017   Primary osteoarthritis of right knee 10/12/2017   Taking multiple medications for chronic disease 07/30/2015   Bronchitis 07/01/2015   Reactive airway disease 07/01/2015   Allergic sinusitis 07/01/2015   Essential hypertension 07/01/2015   Valvular heart disease 07/01/2015    Metatarsalgia of left foot 06/10/2015   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    Allergies  Allergen Reactions   Meloxicam Palpitations   Sulfa Antibiotics Rash    Past Surgical History:  Procedure Laterality Date   ABDOMINAL EXPOSURE N/A 01/18/2021   Procedure: ABDOMINAL EXPOSURE;  Surgeon: [name], MD;  Location: [place];  Service: Vascular;  Laterality: N/A;   ANTERIOR LUMBAR FUSION N/A 01/18/2021   Procedure: Anterior Lumbar Interbody Fusion - Lumbar Five-Sarcal One;  Surgeon: Kary Kos, MD;  Location: [place];  Service: Neurosurgery;  Laterality: N/A;  Anterior Lumbar Interbody Fusion - Lumbar Five-Sacral One   CYSTOSCOPY W/ URETERAL STENT PLACEMENT Left 06/18/2021   Procedure: CYSTOSCOPY WITH RETROGRADE PYELOGRAM/URETERAL STENT EXCHANGE;  Surgeon: Alexis Frock, MD;  Location: [facility];  Service: Urology;  Laterality: Left;   CYSTOSCOPY WITH RETROGRADE PYELOGRAM, URETEROSCOPY AND STENT PLACEMENT Left 04/21/2021   Procedure: CYSTOSCOPY WITH RETROGRADE PYELOGRAM, DIAGNOSTICURETEROSCOPY AND STENT PLACEMENT;  Surgeon: Alexis Frock, MD;  Location: [facility];  Service: Urology;  Laterality: Left;  1 HR   EXTRACORPOREAL SHOCK WAVE LITHOTRIPSY     approx 2002   LUMBAR [place] SURGERY  2018   REPAIR DURAL / CSF LEAK  2018   post op lumbar diskectomy   REVISION TOTAL KNEE ARTHROPLASTY Left 2015   TOTAL KNEE ARTHROPLASTY Left 2012   TRANSFORAMINAL LUMBAR INTERBODY FUSION (TLIF) WITH PEDICLE SCREW FIXATION 1 LEVEL N/A 06/12/2019   Procedure: LUMBAR FOUR-FIVE TRANSFORAMINAL LUMBAR INTERBODY FUSION (TLIF) WITH PEDICLE SCREW FIXATION;  Surgeon: Kary Kos, MD;  Location: [place];  Service: Neurosurgery;  Laterality: N/A;   UMBILICAL HERNIA REPAIR  1992    Social History   Tobacco Use   Smoking status: Never   Smokeless tobacco: Former    Types: Chew    Quit date: 03/2020  Vaping Use   Vaping Use: Never used  Substance Use Topics   Alcohol use: Yes    Comment: ocassional    Drug use: Never     Medication list has been reviewed and updated.  Current Meds  Medication Sig   Acetaminophen (TYLENOL PO) Take by mouth as needed.   apixaban (ELIQUIS) 5 MG TABS tablet Take 5 mg by mouth 2 (two) times daily.   diltiazem (CARDIZEM CD) 180 MG 24 hr capsule Take 1 capsule (180 mg total) by mouth 2 (two) times daily.   ferrous sulfate 325 (65 FE) MG tablet Take 1 tablet by mouth daily with breakfast.   fexofenadine (ALLEGRA) 180 MG tablet Take 1 tablet (180 mg total) by mouth daily.   fluticasone (FLONASE) 50 MCG/ACT nasal spray Place 2 sprays into both nostrils daily.   hydrochlorothiazide (HYDRODIURIL) 12.5 MG tablet Take 1 tablet (12.5 mg total) by mouth daily.   metoprolol succinate (TOPROL-XL) 25 MG 24 hr tablet Take 1 tablet (25 mg total) by mouth daily.   montelukast (SINGULAIR) 10 MG tablet Take 1 tablet (10 mg total) by mouth at bedtime.   Multiple Vitamins-Minerals (MULTIVITAMIN MEN) TABS Take by mouth.   Olopatadine HCl 0.2 % SOLN Place 1 drop into both eyes 2 (two) times daily as needed (allergies.).   pantoprazole (PROTONIX) 40 MG tablet Take 1 tablet (40 mg total)  by mouth every evening.   potassium chloride SA (KLOR-CON M) 20 MEQ tablet Take 1-2 tablets (20-40 mEq total) by mouth 2 (two) times daily. Takes one tablet in am and two tabs in pm   rosuvastatin (CRESTOR) 5 MG tablet Take 1 tablet (5 mg total) by mouth 3 (three) times a week.   tadalafil (CIALIS) 5 MG tablet Take 1-2 tablets (5-10 mg total) by mouth daily as needed for erectile dysfunction.   tamsulosin (FLOMAX) 0.4 MG CAPS capsule Take 1 capsule (0.4 mg total) by mouth daily.   valsartan (DIOVAN) 320 MG tablet Take 1 tablet (320 mg total) by mouth daily.       03/22/2022   10:20 AM 02/21/2022    8:09 AM 09/20/2021    4:13 PM 09/03/2021    9:03 AM  GAD 7 : Generalized Anxiety Score  Nervous, Anxious, on Edge 0 0 0 0  Control/stop worrying 0 0 0 0  Worry too much - different things 0 0 0 0   Trouble relaxing 0 0 0 0  Restless 0 0 0 0  Easily annoyed or irritable 0 0 0 0  Afraid - awful might happen 0 0 0 0  Total GAD 7 Score 0 0 0 0  Anxiety Difficulty Not difficult at all Not difficult at all Not difficult at all Not difficult at all       03/22/2022   10:20 AM  Depression screen PHQ 2/9  Decreased Interest 0  Down, Depressed, Hopeless 0  PHQ - 2 Score 0  Altered sleeping 0  Tired, decreased energy 0  Change in appetite 0  Feeling bad or failure about yourself  0  Trouble concentrating 0  Moving slowly or fidgety/restless 0  Suicidal thoughts 0  PHQ-9 Score 0  Difficult doing work/chores Not difficult at all    BP Readings from Last 3 Encounters:  03/22/22 138/80  02/21/22 (!) 140/102  02/20/22 (!) 171/104    Physical Exam Vitals and nursing note reviewed.  HENT:     Right Ear: Tympanic membrane normal.     Left Ear: Tympanic membrane normal.     Nose: Nose normal. No congestion or rhinorrhea.  Cardiovascular:     Heart sounds: No murmur heard.   No friction rub. No gallop.  Pulmonary:     Breath sounds: No wheezing, rhonchi or rales.    Wt Readings from Last 3 Encounters:  03/22/22 240 lb (108.9 kg)  02/21/22 242 lb (109.8 kg)  02/20/22 240 lb (108.9 kg)    BP 138/80   Pulse 84   Ht 6' 1"  (1.854 m)   Wt 240 lb (108.9 kg)   BMI 31.66 kg/m   Assessment and Plan:  1. Essential hypertension Chronic.  Controlled.  Stable.  Blood pressure 138/80.  Patient was having a cough on ACE and this was discontinued and there has been resolution of the cough.  Patient will continue on valsartan 320 mg once a day metoprolol XL 25 mg once a day and diltiazem 180 mg twice a day.  Patient is also on hydrochlorothiazide once a day.  We will recheck in 6 months.

## 2022-03-29 DIAGNOSIS — Z6832 Body mass index (BMI) 32.0-32.9, adult: Secondary | ICD-10-CM | POA: Diagnosis not present

## 2022-03-29 DIAGNOSIS — M5440 Lumbago with sciatica, unspecified side: Secondary | ICD-10-CM | POA: Diagnosis not present

## 2022-03-30 ENCOUNTER — Other Ambulatory Visit: Payer: Self-pay | Admitting: Neurosurgery

## 2022-03-30 DIAGNOSIS — M5440 Lumbago with sciatica, unspecified side: Secondary | ICD-10-CM

## 2022-03-31 ENCOUNTER — Ambulatory Visit
Admission: RE | Admit: 2022-03-31 | Discharge: 2022-03-31 | Disposition: A | Discharge status: Home / Self Care | Payer: Medicare PPO | Source: Ambulatory Visit | Attending: Neurosurgery | Admitting: Neurosurgery

## 2022-03-31 DIAGNOSIS — M5440 Lumbago with sciatica, unspecified side: Secondary | ICD-10-CM | POA: Diagnosis not present

## 2022-03-31 DIAGNOSIS — M545 Low back pain, unspecified: Secondary | ICD-10-CM | POA: Diagnosis not present

## 2022-04-06 DIAGNOSIS — F17290 Nicotine dependence, other tobacco product, uncomplicated: Secondary | ICD-10-CM | POA: Diagnosis not present

## 2022-04-06 DIAGNOSIS — I10 Essential (primary) hypertension: Secondary | ICD-10-CM | POA: Diagnosis not present

## 2022-04-06 DIAGNOSIS — M5116 Intervertebral disc disorders with radiculopathy, lumbar region: Secondary | ICD-10-CM | POA: Diagnosis not present

## 2022-04-06 DIAGNOSIS — M47816 Spondylosis without myelopathy or radiculopathy, lumbar region: Secondary | ICD-10-CM | POA: Diagnosis not present

## 2022-04-14 ENCOUNTER — Other Ambulatory Visit: Payer: Self-pay | Admitting: Family Medicine

## 2022-04-14 DIAGNOSIS — I10 Essential (primary) hypertension: Secondary | ICD-10-CM

## 2022-04-14 NOTE — Telephone Encounter (Signed)
Requested Prescriptions  Pending Prescriptions Disp Refills  . valsartan (DIOVAN) 320 MG tablet [Pharmacy Med Name: Valsartan 320 MG Oral Tablet] 30 tablet 3    Sig: Take 1 tablet by mouth once daily     Cardiovascular:  Angiotensin Receptor Blockers Passed - 04/14/2022  9:20 AM      Passed - Cr in normal range and within 180 days    Creatinine  Date Value Ref Range Status  12/04/2013 1.31 (H) 0.60 - 1.30 mg/dL Final   Creatinine, Ser  Date Value Ref Range Status  02/21/2022 0.98 0.76 - 1.27 mg/dL Final         Passed - K in normal range and within 180 days    Potassium  Date Value Ref Range Status  02/21/2022 3.9 3.5 - 5.2 mmol/L Final         Passed - Patient is not pregnant      Passed - Last BP in normal range    BP Readings from Last 1 Encounters:  03/22/22 138/80         Passed - Valid encounter within last 6 months    Recent Outpatient Visits          3 weeks ago Essential hypertension   [place], Deanna C, MD   1 month ago Essential hypertension   [place], Deanna C, MD   3 months ago Acute maxillary sinusitis, recurrence not specified   [facility] Juline Patch, MD   6 months ago Infection   [facility] Juline Patch, MD   7 months ago Fever and chills   [facility] Juline Patch, MD      Future Appointments            In 4 months Juline Patch, MD [facility], [facility]

## 2022-04-19 ENCOUNTER — Other Ambulatory Visit: Payer: Self-pay | Admitting: Family Medicine

## 2022-04-19 DIAGNOSIS — R972 Elevated prostate specific antigen [PSA]: Secondary | ICD-10-CM | POA: Diagnosis not present

## 2022-04-19 DIAGNOSIS — N135 Crossing vessel and stricture of ureter without hydronephrosis: Secondary | ICD-10-CM | POA: Diagnosis not present

## 2022-04-19 DIAGNOSIS — E782 Mixed hyperlipidemia: Secondary | ICD-10-CM

## 2022-04-19 DIAGNOSIS — N200 Calculus of kidney: Secondary | ICD-10-CM | POA: Diagnosis not present

## 2022-05-03 DIAGNOSIS — C44329 Squamous cell carcinoma of skin of other parts of face: Secondary | ICD-10-CM | POA: Diagnosis not present

## 2022-05-04 DIAGNOSIS — M1711 Unilateral primary osteoarthritis, right knee: Secondary | ICD-10-CM | POA: Diagnosis not present

## 2022-05-14 ENCOUNTER — Other Ambulatory Visit: Payer: Self-pay | Admitting: Family Medicine

## 2022-05-14 DIAGNOSIS — J302 Other seasonal allergic rhinitis: Secondary | ICD-10-CM

## 2022-05-14 DIAGNOSIS — E782 Mixed hyperlipidemia: Secondary | ICD-10-CM

## 2022-05-17 DIAGNOSIS — Z872 Personal history of diseases of the skin and subcutaneous tissue: Secondary | ICD-10-CM | POA: Diagnosis not present

## 2022-05-17 DIAGNOSIS — Z85828 Personal history of other malignant neoplasm of skin: Secondary | ICD-10-CM | POA: Diagnosis not present

## 2022-05-17 DIAGNOSIS — D044 Carcinoma in situ of skin of scalp and neck: Secondary | ICD-10-CM | POA: Diagnosis not present

## 2022-05-17 DIAGNOSIS — L578 Other skin changes due to chronic exposure to nonionizing radiation: Secondary | ICD-10-CM | POA: Diagnosis not present

## 2022-05-17 DIAGNOSIS — D225 Melanocytic nevi of trunk: Secondary | ICD-10-CM | POA: Diagnosis not present

## 2022-05-17 DIAGNOSIS — C44319 Basal cell carcinoma of skin of other parts of face: Secondary | ICD-10-CM | POA: Diagnosis not present

## 2022-05-17 DIAGNOSIS — Z859 Personal history of malignant neoplasm, unspecified: Secondary | ICD-10-CM | POA: Diagnosis not present

## 2022-05-17 DIAGNOSIS — L570 Actinic keratosis: Secondary | ICD-10-CM | POA: Diagnosis not present

## 2022-05-17 DIAGNOSIS — L300 Nummular dermatitis: Secondary | ICD-10-CM | POA: Diagnosis not present

## 2022-05-17 DIAGNOSIS — D485 Neoplasm of uncertain behavior of skin: Secondary | ICD-10-CM | POA: Diagnosis not present

## 2022-05-19 DIAGNOSIS — M5440 Lumbago with sciatica, unspecified side: Secondary | ICD-10-CM | POA: Diagnosis not present

## 2022-05-24 ENCOUNTER — Ambulatory Visit: Payer: Medicare PPO | Admitting: Family Medicine

## 2022-05-24 ENCOUNTER — Encounter: Payer: Self-pay | Admitting: Family Medicine

## 2022-05-24 VITALS — BP 128/88 | HR 76 | Ht 73.0 in | Wt 243.0 lb

## 2022-05-24 DIAGNOSIS — H1033 Unspecified acute conjunctivitis, bilateral: Secondary | ICD-10-CM | POA: Diagnosis not present

## 2022-05-24 DIAGNOSIS — J0100 Acute maxillary sinusitis, unspecified: Secondary | ICD-10-CM

## 2022-05-24 MED ORDER — AZITHROMYCIN 250 MG PO TABS: ORAL_TABLET | ORAL | 0 refills | Status: AC

## 2022-05-24 MED ORDER — CIPROFLOXACIN HCL 0.3 % OP SOLN: 1.0000 [drp] | OPHTHALMIC | 0 refills | Status: DC

## 2022-05-24 NOTE — Progress Notes (Signed)
Date:  05/24/2022   Name:  [name]   DOB:  Mar 05, 1956   MRN:  [id number]   Chief Complaint: eye redness (Head stopped up- "eye feels warm")  Conjunctivitis  The current episode started 2 days ago. The onset was sudden. The problem is mild. Associated symptoms include decreased vision, congestion and eye redness. Pertinent negatives include no fever, no abdominal pain, no ear pain and no sore throat. The left eye is affected.  Sinusitis This is a new problem. The current episode started yesterday. The problem has been gradually worsening since onset. Associated symptoms include congestion and sinus pressure. Pertinent negatives include no ear pain, shortness of breath or sore throat.    Lab Results  Component Value Date   NA 144 02/21/2022   K 3.9 02/21/2022   CO2 24 02/21/2022   GLUCOSE 140 (H) 02/21/2022   BUN 16 02/21/2022   CREATININE 0.98 02/21/2022   CALCIUM 9.3 02/21/2022   EGFR 85 02/21/2022   GFRNONAA >60 08/02/2021   Lab Results  Component Value Date   CHOL 212 (H) 02/21/2022   HDL 57 02/21/2022   LDLCALC 145 (H) 02/21/2022   TRIG 55 02/21/2022   CHOLHDL 4.1 08/07/2019   Lab Results  Component Value Date   TSH 0.569 02/21/2022   Lab Results  Component Value Date   HGBA1C 6.2 (H) 02/21/2022   Lab Results  Component Value Date   WBC 6.3 10/29/2021   HGB 13.2 10/29/2021   HCT 41.0 10/29/2021   MCV 79 10/29/2021   PLT 204 10/29/2021   Lab Results  Component Value Date   ALT 17 02/21/2022   AST 13 02/21/2022   ALKPHOS 111 02/21/2022   BILITOT 0.4 02/21/2022   No results found for: "25OHVITD2", "25OHVITD3", "VD25OH"   Review of Systems  Constitutional:  Negative for fever.  HENT:  Positive for congestion and sinus pressure. Negative for ear pain and sore throat.   Eyes:  Positive for redness.  Respiratory:  Negative for shortness of breath.   Cardiovascular:  Negative for chest pain, palpitations and leg swelling.  Gastrointestinal:   Negative for abdominal pain.    Patient Active Problem List   Diagnosis Date Noted   Hypokalemia 08/24/2021   Bacteremia due to Gram-negative bacteria    AF (paroxysmal atrial fibrillation) (HCC)    SIRS (systemic inflammatory response syndrome) ([place]) 07/27/2021   Sepsis secondary to UTI ([place]) 07/27/2021   Ureteral stricture 06/18/2021   Sacroiliitis ([place]) 05/07/2021   Spinal stenosis of lumbosacral region 01/18/2021   Herniated nucleus pulposus, lumbar 03/16/2020   Body mass index (BMI) 33.0-33.9, adult 09/03/2019   Other intervertebral disc displacement, lumbar region 06/25/2019   HNP (herniated nucleus pulposus), lumbar 06/12/2019   UTI due to Klebsiella species 05/08/2019   Biceps tendonitis on right 07/27/2018   Spinal stenosis, 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   Lumbago with sciatica, unspecified side 11/14/2017   Primary osteoarthritis of right knee 10/12/2017   Taking multiple medications for chronic disease 07/30/2015   Bronchitis 07/01/2015   Reactive airway disease 07/01/2015   Allergic sinusitis 07/01/2015   Essential hypertension 07/01/2015   Valvular heart disease 07/01/2015   Metatarsalgia of left foot 06/10/2015   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    Allergies  Allergen Reactions   Meloxicam Palpitations   Sulfa Antibiotics Rash    Past Surgical History:  Procedure Laterality Date   ABDOMINAL EXPOSURE N/A 01/18/2021   Procedure: ABDOMINAL EXPOSURE;  Surgeon: [name], MD;  Location: [facility];  Service: Vascular;  Laterality: N/A;   ANTERIOR LUMBAR FUSION N/A 01/18/2021   Procedure: Anterior Lumbar Interbody Fusion - Lumbar Five-Sarcal One;  Surgeon: Kary Kos, MD;  Location: [place];  Service: Neurosurgery;  Laterality: N/A;  Anterior Lumbar Interbody Fusion - Lumbar Five-Sacral One   CYSTOSCOPY W/ URETERAL STENT PLACEMENT Left 06/18/2021   Procedure: CYSTOSCOPY WITH RETROGRADE PYELOGRAM/URETERAL STENT EXCHANGE;  Surgeon: Alexis Frock, MD;  Location[facility];  Service: Urology;  Laterality: Left;   CYSTOSCOPY WITH RETROGRADE PYELOGRAM, URETEROSCOPY AND STENT PLACEMENT Left 04/21/2021   Procedure: CYSTOSCOPY WITH RETROGRADE PYELOGRAM, DIAGNOSTICURETEROSCOPY AND STENT PLACEMENT;  Surgeon: Alexis Frock, MD;  Location: [facility];  Service: Urology;  Laterality: Left;  1 HR   EXTRACORPOREAL SHOCK WAVE LITHOTRIPSY     approx 2002   LUMBAR [place] SURGERY  2018   REPAIR DURAL / CSF LEAK  2018   post op lumbar diskectomy   REVISION TOTAL KNEE ARTHROPLASTY Left 2015   TOTAL KNEE ARTHROPLASTY Left 2012   TRANSFORAMINAL LUMBAR INTERBODY FUSION (TLIF) WITH PEDICLE SCREW FIXATION 1 LEVEL N/A 06/12/2019   Procedure: LUMBAR FOUR-FIVE TRANSFORAMINAL LUMBAR INTERBODY FUSION (TLIF) WITH PEDICLE SCREW FIXATION;  Surgeon: Kary Kos, MD;  Location: [place];  Service: Neurosurgery;  Laterality: N/A;   UMBILICAL HERNIA REPAIR  1992    Social History   Tobacco Use   Smoking status: Never   Smokeless tobacco: Former    Types: Chew    Quit date: 03/2020  Vaping Use   Vaping Use: Never used  Substance Use Topics   Alcohol use: Yes    Comment: ocassional   Drug use: Never     Medication list has been reviewed and updated.  Current Meds  Medication Sig   Acetaminophen (TYLENOL PO) Take by mouth as needed.   apixaban (ELIQUIS) 5 MG TABS tablet Take 5 mg by mouth 2 (two) times daily.   diltiazem (CARDIZEM CD) 180 MG 24 hr capsule Take 1 capsule (180 mg total) by mouth 2 (two) times daily.   ferrous sulfate 325 (65 FE) MG tablet Take 1 tablet by mouth daily with breakfast.   fexofenadine (ALLEGRA) 180 MG tablet Take 1 tablet (180 mg total) by mouth daily.   fluticasone (FLONASE) 50 MCG/ACT nasal spray Place 2 sprays into both nostrils daily.   hydrochlorothiazide (HYDRODIURIL) 12.5 MG tablet Take 1 tablet (12.5 mg total) by mouth daily.   metoprolol succinate (TOPROL-XL) 25 MG 24 hr tablet Take 1 tablet (25 mg  total) by mouth daily.   montelukast (SINGULAIR) 10 MG tablet TAKE 1 TABLET BY MOUTH AT BEDTIME   Multiple Vitamins-Minerals (MULTIVITAMIN MEN) TABS Take by mouth.   Olopatadine HCl 0.2 % SOLN Place 1 drop into both eyes 2 (two) times daily as needed (allergies.).   pantoprazole (PROTONIX) 40 MG tablet Take 1 tablet (40 mg total) by mouth every evening.   potassium chloride SA (KLOR-CON M) 20 MEQ tablet Take 1-2 tablets (20-40 mEq total) by mouth 2 (two) times daily. Takes one tablet in am and two tabs in pm   pregabalin (LYRICA) 75 MG capsule Take 1 capsule by mouth 2 (two) times daily. Dr C.   rosuvastatin (CRESTOR) 5 MG tablet TAKE 1 TABLET BY MOUTH THREE TIMES A WEEK   tadalafil (CIALIS) 5 MG tablet Take 1-2 tablets (5-10 mg total) by mouth daily as needed for erectile dysfunction.   tamsulosin (FLOMAX) 0.4 MG CAPS capsule Take 1 capsule (0.4 mg total) by mouth  daily.   valsartan (DIOVAN) 320 MG tablet Take 1 tablet by mouth once daily       05/24/2022    8:25 AM 03/22/2022   10:20 AM 02/21/2022    8:09 AM 09/20/2021    4:13 PM  GAD 7 : Generalized Anxiety Score  Nervous, Anxious, on Edge 0 0 0 0  Control/stop worrying 0 0 0 0  Worry too much - different things 0 0 0 0  Trouble relaxing 0 0 0 0  Restless 0 0 0 0  Easily annoyed or irritable 0 0 0 0  Afraid - awful might happen 0 0 0 0  Total GAD 7 Score 0 0 0 0  Anxiety Difficulty Not difficult at all Not difficult at all Not difficult at all Not difficult at all       05/24/2022    8:25 AM 03/22/2022   10:20 AM 02/21/2022    8:08 AM  Depression screen PHQ 2/9  Decreased Interest 0 0 0  Down, Depressed, Hopeless 0 0 0  PHQ - 2 Score 0 0 0  Altered sleeping 0 0 0  Tired, decreased energy 0 0 0  Change in appetite 0 0 0  Feeling bad or failure about yourself  0 0 0  Trouble concentrating 0 0 0  Moving slowly or fidgety/restless 0 0 0  Suicidal thoughts 0 0 0  PHQ-9 Score 0 0 0  Difficult doing work/chores Not difficult at all  Not difficult at all Not difficult at all    BP Readings from Last 3 Encounters:  05/24/22 128/88  03/22/22 138/80  02/21/22 (!) 140/102    Physical Exam Vitals and nursing note reviewed.  HENT:     Right Ear: Hearing, tympanic membrane, ear canal and external ear normal.     Left Ear: Hearing, tympanic membrane, ear canal and external ear normal.     Nose: Nose normal.     Mouth/Throat:     Lips: Pink.     Mouth: Mucous membranes are moist.     Tongue: No lesions. Tongue does not deviate from midline.     Pharynx: Oropharynx is clear. Uvula midline.  Eyes:     Conjunctiva/sclera:     Right eye: Right conjunctiva is injected.     Left eye: Left conjunctiva is injected.     Pupils: Pupils are equal, round, and reactive to light.  Cardiovascular:     Rate and Rhythm: Normal rate.     Heart sounds: No murmur heard.    No gallop.  Pulmonary:     Breath sounds: No wheezing, rhonchi or rales.     Wt Readings from Last 3 Encounters:  05/24/22 243 lb (110.2 kg)  03/22/22 240 lb (108.9 kg)  02/21/22 242 lb (109.8 kg)    BP 128/88   Pulse 76   Ht 6' 1"  (1.854 m)   Wt 243 lb (110.2 kg)   BMI 32.06 kg/m   Assessment and Plan:   1. Acute maxillary sinusitis, recurrence not specified New onset.  Persistent.  Stable.  Tenderness noted over the left maxillary sinus.  We will treat with azithromycin to 50 mg 2 tablets today followed by 1 a day for 4 days. - azithromycin (ZITHROMAX) 250 MG tablet; Take 2 tablets on day 1, then 1 tablet daily on days 2 through 5  Dispense: 6 tablet; Refill: 0  2. Acute bacterial conjunctivitis of both eyes New onset.  Unresolved with Patanol ophthalmic drops.  Patient's been  encouraged to take an over-the-counter antihistamine and we will initiate ciprofloxacin ophthalmic solution 1 drop into both eyes every 2 hours for suspected early bacterial conjunctivitis. - ciprofloxacin (CILOXAN) 0.3 % ophthalmic solution; Place 1 drop into both eyes every  2 (two) hours. Administer 1 drop, every 2 hours, while awake, for 2 days. Then 1 drop, every 4 hours, while awake, for the next 5 days.  Dispense: 5 mL; Refill: 0

## 2022-06-07 DIAGNOSIS — C44329 Squamous cell carcinoma of skin of other parts of face: Secondary | ICD-10-CM | POA: Diagnosis not present

## 2022-06-07 DIAGNOSIS — L988 Other specified disorders of the skin and subcutaneous tissue: Secondary | ICD-10-CM | POA: Diagnosis not present

## 2022-06-15 ENCOUNTER — Other Ambulatory Visit: Payer: Self-pay

## 2022-06-15 DIAGNOSIS — E782 Mixed hyperlipidemia: Secondary | ICD-10-CM | POA: Diagnosis not present

## 2022-06-15 DIAGNOSIS — R69 Illness, unspecified: Secondary | ICD-10-CM

## 2022-06-16 ENCOUNTER — Other Ambulatory Visit: Payer: Self-pay

## 2022-06-16 DIAGNOSIS — E782 Mixed hyperlipidemia: Secondary | ICD-10-CM

## 2022-06-16 LAB — LIPID PANEL WITH LDL/HDL RATIO
Cholesterol, Total: 128 mg/dL (ref 100–199)
HDL: 46 mg/dL (ref >39)
LDL Chol Calc (NIH): 67 mg/dL (ref 0–99)
LDL/HDL Ratio: 1.5 ratio (ref 0.0–3.6)
Triglycerides: 76 mg/dL (ref 0–149)
VLDL Cholesterol Cal: 15 mg/dL (ref 5–40)

## 2022-06-16 LAB — HEPATIC FUNCTION PANEL
ALT: 16 IU/L (ref 0–44)
AST: 13 IU/L (ref 0–40)
Albumin: 4.3 g/dL (ref 3.9–4.9)
Alkaline Phosphatase: 96 IU/L (ref 44–121)
Bilirubin Total: 0.4 mg/dL (ref 0.0–1.2)
Bilirubin, Direct: 0.15 mg/dL (ref 0.00–0.40)
Total Protein: 6.8 g/dL (ref 6.0–8.5)

## 2022-06-16 MED ORDER — ROSUVASTATIN CALCIUM 5 MG PO TABS: ORAL_TABLET | ORAL | 1 refills | Status: DC

## 2022-06-21 ENCOUNTER — Ambulatory Visit: Payer: Medicare PPO | Admitting: Family Medicine

## 2022-06-21 ENCOUNTER — Encounter: Payer: Self-pay | Admitting: Family Medicine

## 2022-06-21 VITALS — BP 120/80 | HR 60 | Ht 73.0 in | Wt 244.0 lb

## 2022-06-21 DIAGNOSIS — L03211 Cellulitis of face: Secondary | ICD-10-CM | POA: Diagnosis not present

## 2022-06-21 DIAGNOSIS — Z23 Encounter for immunization: Secondary | ICD-10-CM

## 2022-06-21 MED ORDER — AMOXICILLIN-POT CLAVULANATE 875-125 MG PO TABS: 1.0000 | ORAL_TABLET | Freq: Two times a day (BID) | ORAL | 0 refills | Status: DC

## 2022-06-21 NOTE — Progress Notes (Addendum)
Date:  06/21/2022   Name:  [name]   DOB:  29-Dec-1955   MRN:  [id number]   Chief Complaint: Ear Pain (Woke up with L) ear pain with swelling), Rash, and Flu Vaccine  Rash This is a new problem. The current episode started yesterday. The problem has been gradually improving since onset. The affected locations include the back. The rash is characterized by redness. Pertinent negatives include no cough or shortness of breath.  Otalgia  There is pain in the left ear. This is a recurrent problem. The current episode started today. The problem has been waxing and waning. The pain is moderate. Associated symptoms include a rash. Pertinent negatives include no coughing, ear discharge or hearing loss. He has tried nothing for the symptoms.    Lab Results  Component Value Date   NA 144 02/21/2022   K 3.9 02/21/2022   CO2 24 02/21/2022   GLUCOSE 140 (H) 02/21/2022   BUN 16 02/21/2022   CREATININE 0.98 02/21/2022   CALCIUM 9.3 02/21/2022   EGFR 85 02/21/2022   GFRNONAA >60 08/02/2021   Lab Results  Component Value Date   CHOL 128 06/15/2022   HDL 46 06/15/2022   LDLCALC 67 06/15/2022   TRIG 76 06/15/2022   CHOLHDL 4.1 08/07/2019   Lab Results  Component Value Date   TSH 0.569 02/21/2022   Lab Results  Component Value Date   HGBA1C 6.2 (H) 02/21/2022   Lab Results  Component Value Date   WBC 6.3 10/29/2021   HGB 13.2 10/29/2021   HCT 41.0 10/29/2021   MCV 79 10/29/2021   PLT 204 10/29/2021   Lab Results  Component Value Date   ALT 16 06/15/2022   AST 13 06/15/2022   ALKPHOS 96 06/15/2022   BILITOT 0.4 06/15/2022   No results found for: "25OHVITD2", "25OHVITD3", "VD25OH"   Review of Systems  HENT:  Positive for ear pain. Negative for ear discharge and hearing loss.   Eyes:  Negative for redness.  Respiratory:  Negative for cough, shortness of breath and wheezing.   Cardiovascular:  Negative for palpitations.  Skin:  Positive for rash.    Patient Active  Problem List   Diagnosis Date Noted   Hypokalemia 08/24/2021   Bacteremia due to Gram-negative bacteria    AF (paroxysmal atrial fibrillation) (HCC)    SIRS (systemic inflammatory response syndrome) ([place]) 07/27/2021   Sepsis secondary to UTI ([place]) 07/27/2021   Ureteral stricture 06/18/2021   Sacroiliitis ([place]) 05/07/2021   Spinal stenosis of lumbosacral region 01/18/2021   Herniated nucleus pulposus, lumbar 03/16/2020   Body mass index (BMI) 33.0-33.9, adult 09/03/2019   Other intervertebral disc displacement, lumbar region 06/25/2019   HNP (herniated nucleus pulposus), lumbar 06/12/2019   UTI due to Klebsiella species 05/08/2019   Biceps tendonitis on right 07/27/2018   Spinal stenosis, 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   Lumbago with sciatica, unspecified side 11/14/2017   Primary osteoarthritis of right knee 10/12/2017   Taking multiple medications for chronic disease 07/30/2015   Bronchitis 07/01/2015   Reactive airway disease 07/01/2015   Allergic sinusitis 07/01/2015   Essential hypertension 07/01/2015   Valvular heart disease 07/01/2015   Metatarsalgia of left foot 06/10/2015   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    Allergies  Allergen Reactions   Meloxicam Palpitations   Sulfa Antibiotics Rash    Past Surgical History:  Procedure Laterality Date   ABDOMINAL EXPOSURE N/A 01/18/2021   Procedure: ABDOMINAL EXPOSURE;  Surgeon: [name],  MD;  Location: [facility];  Service: Vascular;  Laterality: N/A;   ANTERIOR LUMBAR FUSION N/A 01/18/2021   Procedure: Anterior Lumbar Interbody Fusion - Lumbar Five-Sarcal One;  Surgeon: Kary Kos, MD;  Location: [facility];  Service: Neurosurgery;  Laterality: N/A;  Anterior Lumbar Interbody Fusion - Lumbar Five-Sacral One   CYSTOSCOPY W/ URETERAL STENT PLACEMENT Left 06/18/2021   Procedure: CYSTOSCOPY WITH RETROGRADE PYELOGRAM/URETERAL STENT EXCHANGE;  Surgeon: Alexis Frock, MD;  Location: [facility];  Service: Urology;   Laterality: Left;   CYSTOSCOPY WITH RETROGRADE PYELOGRAM, URETEROSCOPY AND STENT PLACEMENT Left 04/21/2021   Procedure: CYSTOSCOPY WITH RETROGRADE PYELOGRAM, DIAGNOSTICURETEROSCOPY AND STENT PLACEMENT;  Surgeon: Alexis Frock, MD;  [facility];  Service: Urology;  Laterality: Left;  1 HR   EXTRACORPOREAL SHOCK WAVE LITHOTRIPSY     approx 2002   LUMBAR [place] SURGERY  2018   REPAIR DURAL / CSF LEAK  2018   post op lumbar diskectomy   REVISION TOTAL KNEE ARTHROPLASTY Left 2015   TOTAL KNEE ARTHROPLASTY Left 2012   TRANSFORAMINAL LUMBAR INTERBODY FUSION (TLIF) WITH PEDICLE SCREW FIXATION 1 LEVEL N/A 06/12/2019   Procedure: LUMBAR FOUR-FIVE TRANSFORAMINAL LUMBAR INTERBODY FUSION (TLIF) WITH PEDICLE SCREW FIXATION;  Surgeon: Kary Kos, MD;  Location: [facility];  Service: Neurosurgery;  Laterality: N/A;   UMBILICAL HERNIA REPAIR  1992    Social History   Tobacco Use   Smoking status: Never   Smokeless tobacco: Former    Types: Chew    Quit date: 03/2020  Vaping Use   Vaping Use: Never used  Substance Use Topics   Alcohol use: Yes    Comment: ocassional   Drug use: Never     Medication list has been reviewed and updated.  Current Meds  Medication Sig   Acetaminophen (TYLENOL PO) Take by mouth as needed.   apixaban (ELIQUIS) 5 MG TABS tablet Take 5 mg by mouth 2 (two) times daily.   diltiazem (CARDIZEM CD) 180 MG 24 hr capsule Take 1 capsule (180 mg total) by mouth 2 (two) times daily.   ferrous sulfate 325 (65 FE) MG tablet Take 1 tablet by mouth daily with breakfast.   fexofenadine (ALLEGRA) 180 MG tablet Take 1 tablet (180 mg total) by mouth daily.   fluticasone (FLONASE) 50 MCG/ACT nasal spray Place 2 sprays into both nostrils daily.   hydrochlorothiazide (HYDRODIURIL) 12.5 MG tablet Take 1 tablet (12.5 mg total) by mouth daily.   metoprolol succinate (TOPROL-XL) 25 MG 24 hr tablet Take 1 tablet (25 mg total) by mouth daily.   montelukast (SINGULAIR) 10 MG  tablet TAKE 1 TABLET BY MOUTH AT BEDTIME   Multiple Vitamins-Minerals (MULTIVITAMIN MEN) TABS Take by mouth.   Olopatadine HCl 0.2 % SOLN Place 1 drop into both eyes 2 (two) times daily as needed (allergies.).   pantoprazole (PROTONIX) 40 MG tablet Take 1 tablet (40 mg total) by mouth every evening.   potassium chloride SA (KLOR-CON M) 20 MEQ tablet Take 1-2 tablets (20-40 mEq total) by mouth 2 (two) times daily. Takes one tablet in am and two tabs in pm   pregabalin (LYRICA) 75 MG capsule Take 1 capsule by mouth 2 (two) times daily. Dr C.   rosuvastatin (CRESTOR) 5 MG tablet TAKE 1 TABLET BY MOUTH THREE TIMES A WEEK   tadalafil (CIALIS) 5 MG tablet Take 1-2 tablets (5-10 mg total) by mouth daily as needed for erectile dysfunction.   tamsulosin (FLOMAX) 0.4 MG CAPS capsule Take 1 capsule (0.4 mg total)  by mouth daily.   valsartan (DIOVAN) 320 MG tablet Take 1 tablet by mouth once daily       06/21/2022   10:59 AM 05/24/2022    8:25 AM 03/22/2022   10:20 AM 02/21/2022    8:09 AM  GAD 7 : Generalized Anxiety Score  Nervous, Anxious, on Edge 0 0 0 0  Control/stop worrying 0 0 0 0  Worry too much - different things 0 0 0 0  Trouble relaxing 0 0 0 0  Restless 0 0 0 0  Easily annoyed or irritable 0 0 0 0  Afraid - awful might happen 0 0 0 0  Total GAD 7 Score 0 0 0 0  Anxiety Difficulty Not difficult at all Not difficult at all Not difficult at all Not difficult at all       06/21/2022   10:59 AM 05/24/2022    8:25 AM 03/22/2022   10:20 AM  Depression screen PHQ 2/9  Decreased Interest 0 0 0  Down, Depressed, Hopeless 0 0 0  PHQ - 2 Score 0 0 0  Altered sleeping 0 0 0  Tired, decreased energy 0 0 0  Change in appetite 0 0 0  Feeling bad or failure about yourself  0 0 0  Trouble concentrating 0 0 0  Moving slowly or fidgety/restless 0 0 0  Suicidal thoughts 0 0 0  PHQ-9 Score 0 0 0  Difficult doing work/chores Not difficult at all Not difficult at all Not difficult at all    BP  Readings from Last 3 Encounters:  06/21/22 120/80  05/24/22 128/88  03/22/22 138/80    Physical Exam HENT:     Left Ear: Tympanic membrane and ear canal normal. Swelling and tenderness present.     Wt Readings from Last 3 Encounters:  06/21/22 244 lb (110.7 kg)  05/24/22 243 lb (110.2 kg)  03/22/22 240 lb (108.9 kg)    BP 120/80   Pulse 60   Ht 6' 1"  (1.854 m)   Wt 244 lb (110.7 kg)   BMI 32.19 kg/m   Assessment and Plan: 1. Need for immunization against influenza Discussed and administered - Flu Vaccine QUAD 55moIM (Fluarix, Fluzone & Alfiuria Quad PF)  2. Cellulitis of face New onset.  Persistent.  Patient awoke with swelling of his left pinnae with tenderness.  History is consistent for cellulitis.  There is no external otitis canals clear tympanic membranes clear this seems to be the outer ear which may be consistent with trauma or insect involvement.  We will treat with Augmentin 875 mg twice a day for 10 days. - amoxicillin-clavulanate (AUGMENTIN) 875-125 MG tablet; Take 1 tablet by mouth 2 (two) times daily.  Dispense: 20 tablet; Refill: 0  DOtilio Miu MD

## 2022-06-24 DIAGNOSIS — L988 Other specified disorders of the skin and subcutaneous tissue: Secondary | ICD-10-CM | POA: Diagnosis not present

## 2022-06-24 DIAGNOSIS — D044 Carcinoma in situ of skin of scalp and neck: Secondary | ICD-10-CM | POA: Diagnosis not present

## 2022-07-06 ENCOUNTER — Other Ambulatory Visit: Payer: Self-pay

## 2022-07-06 DIAGNOSIS — I10 Essential (primary) hypertension: Secondary | ICD-10-CM

## 2022-07-26 DIAGNOSIS — M5416 Radiculopathy, lumbar region: Secondary | ICD-10-CM | POA: Diagnosis not present

## 2022-07-26 DIAGNOSIS — Z6832 Body mass index (BMI) 32.0-32.9, adult: Secondary | ICD-10-CM | POA: Diagnosis not present

## 2022-07-26 DIAGNOSIS — S32009K Unspecified fracture of unspecified lumbar vertebra, subsequent encounter for fracture with nonunion: Secondary | ICD-10-CM | POA: Diagnosis not present

## 2022-07-27 DIAGNOSIS — M5416 Radiculopathy, lumbar region: Secondary | ICD-10-CM | POA: Diagnosis not present

## 2022-07-27 DIAGNOSIS — M47816 Spondylosis without myelopathy or radiculopathy, lumbar region: Secondary | ICD-10-CM | POA: Diagnosis not present

## 2022-07-27 DIAGNOSIS — M5136 Other intervertebral disc degeneration, lumbar region: Secondary | ICD-10-CM | POA: Diagnosis not present

## 2022-07-27 DIAGNOSIS — M461 Sacroiliitis, not elsewhere classified: Secondary | ICD-10-CM | POA: Diagnosis not present

## 2022-07-27 DIAGNOSIS — I10 Essential (primary) hypertension: Secondary | ICD-10-CM | POA: Diagnosis not present

## 2022-08-01 ENCOUNTER — Telehealth: Payer: Self-pay | Admitting: Urology

## 2022-08-01 MED ORDER — TADALAFIL 5 MG PO TABS: 5.0000 mg | ORAL_TABLET | Freq: Every day | ORAL | 3 refills | Status: DC | PRN

## 2022-08-01 NOTE — Telephone Encounter (Signed)
Spoke with patient and asked him to call around to see what pharmacies have this medication, not all pharmacies are out of stock, pt states he will check.

## 2022-08-01 NOTE — Telephone Encounter (Signed)
Spoke with patient and advised results rx sent to pharmacy by e-script  

## 2022-08-01 NOTE — Telephone Encounter (Signed)
Patient called the office back to report that the [facility] in [place] has the medication in stock.   Please send the prescription there to be filled.

## 2022-08-01 NOTE — Telephone Encounter (Signed)
Patient's wife called the office today to report that tadalafil is on backorder.  Pharmacy is recommending that the patient consider trying another medication.   Please advise.

## 2022-08-04 ENCOUNTER — Other Ambulatory Visit: Payer: Self-pay | Admitting: Neurosurgery

## 2022-08-21 IMAGING — CR DG CHEST 2V
2 series · 3 of 3 positions shown · non-contrast
Comparison: 12/03/2018

CLINICAL DATA: 65-year-old male with cough

EXAM:
CHEST - 2 VIEW

[chest pa]
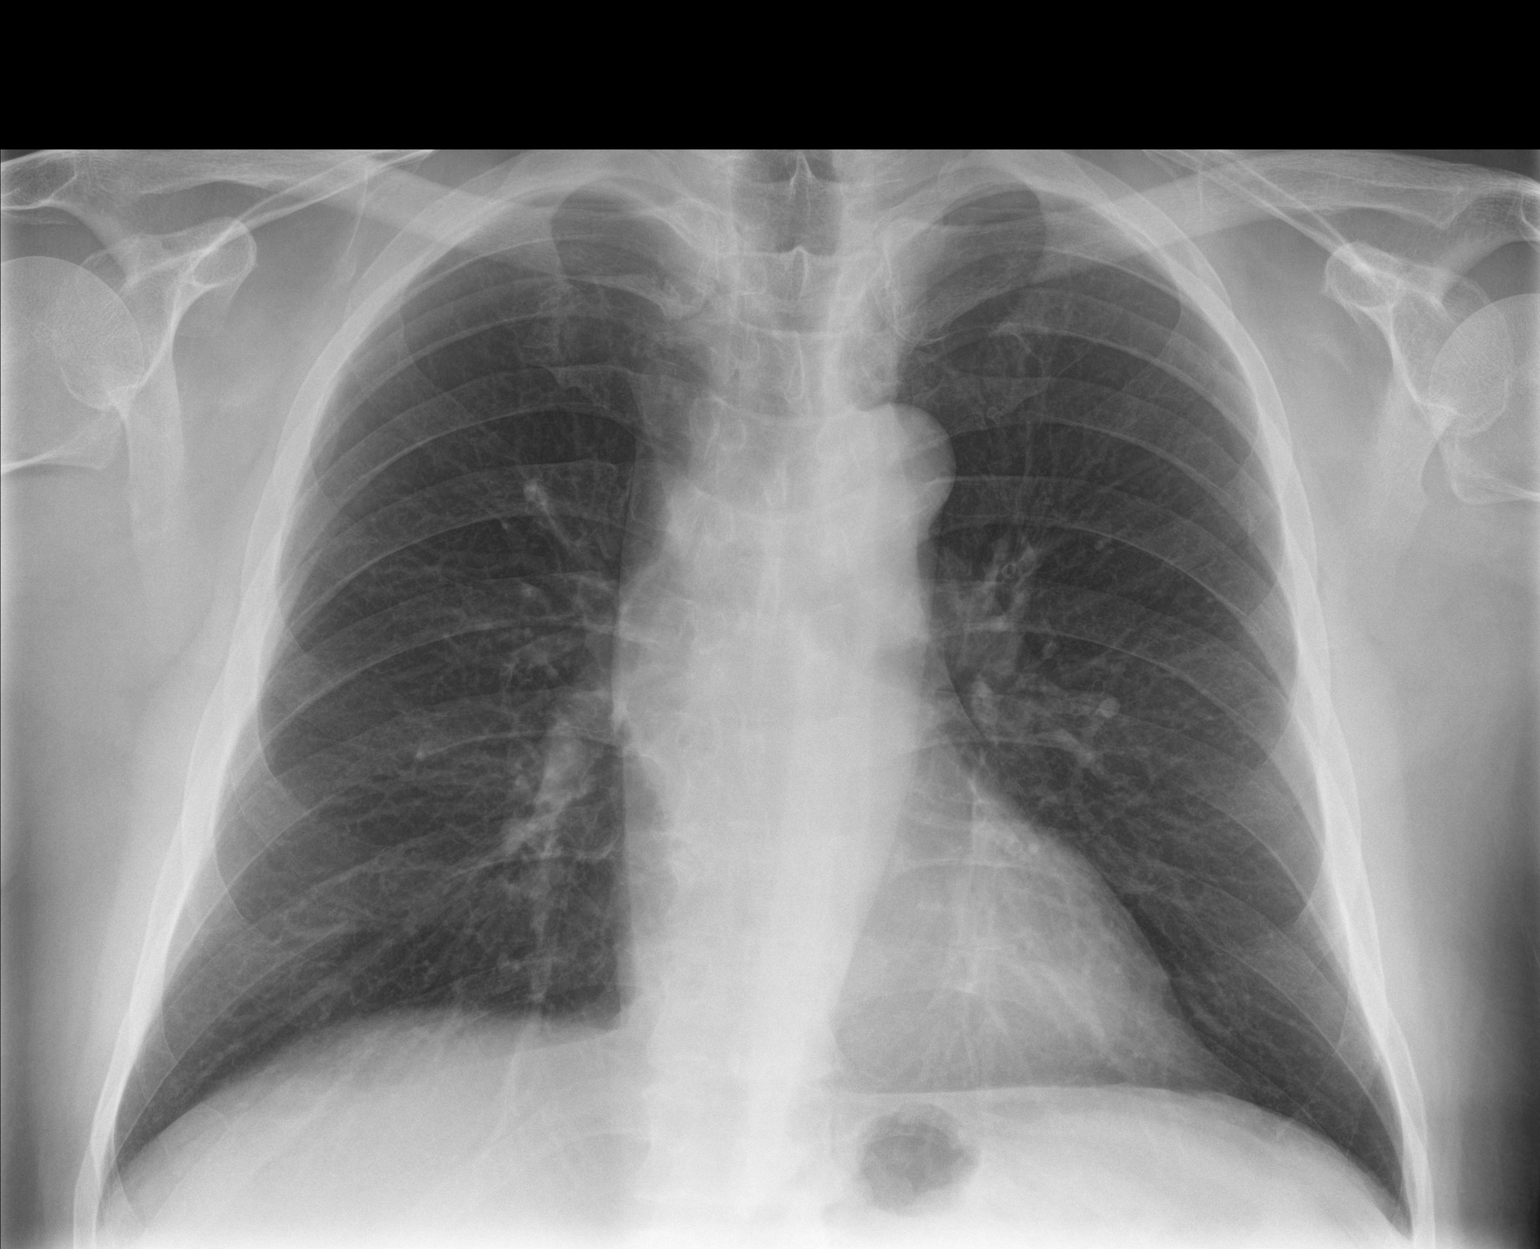

[Series 2: chest lat · 0.14mm/px · 2 of 2 slices shown]
[im 1/2]
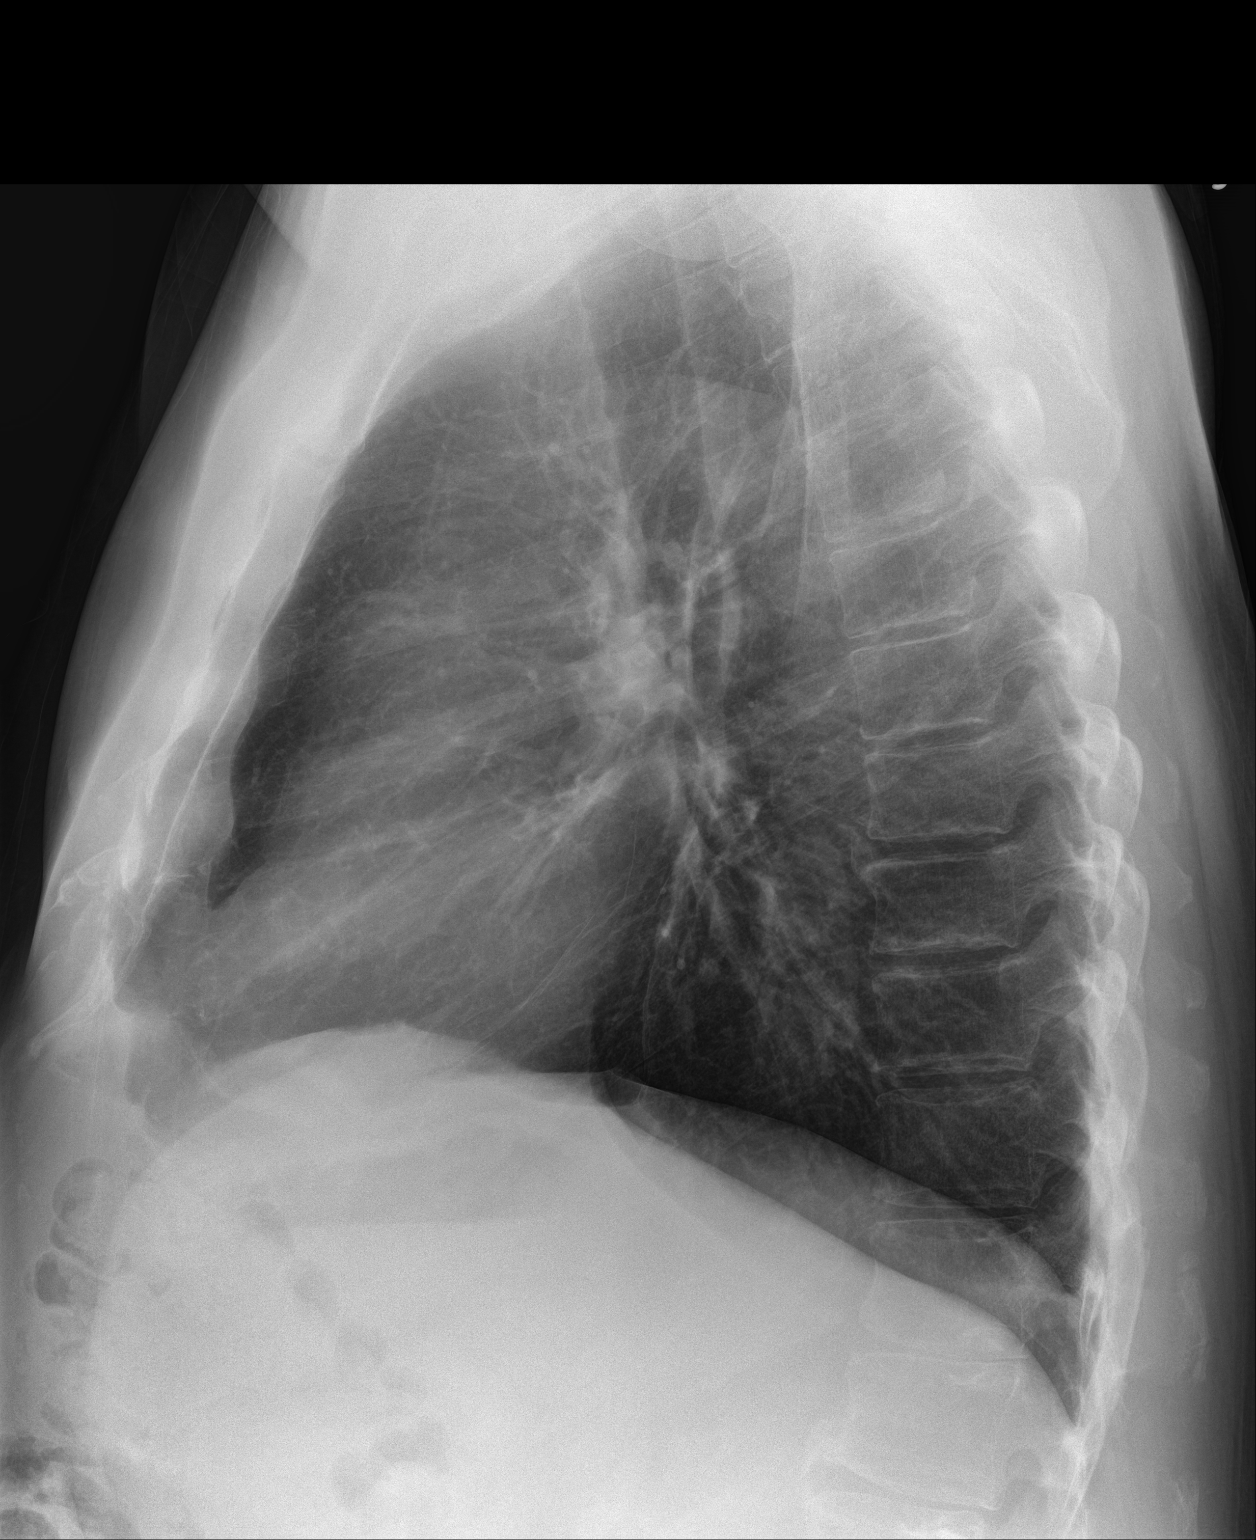
[im 2/2]
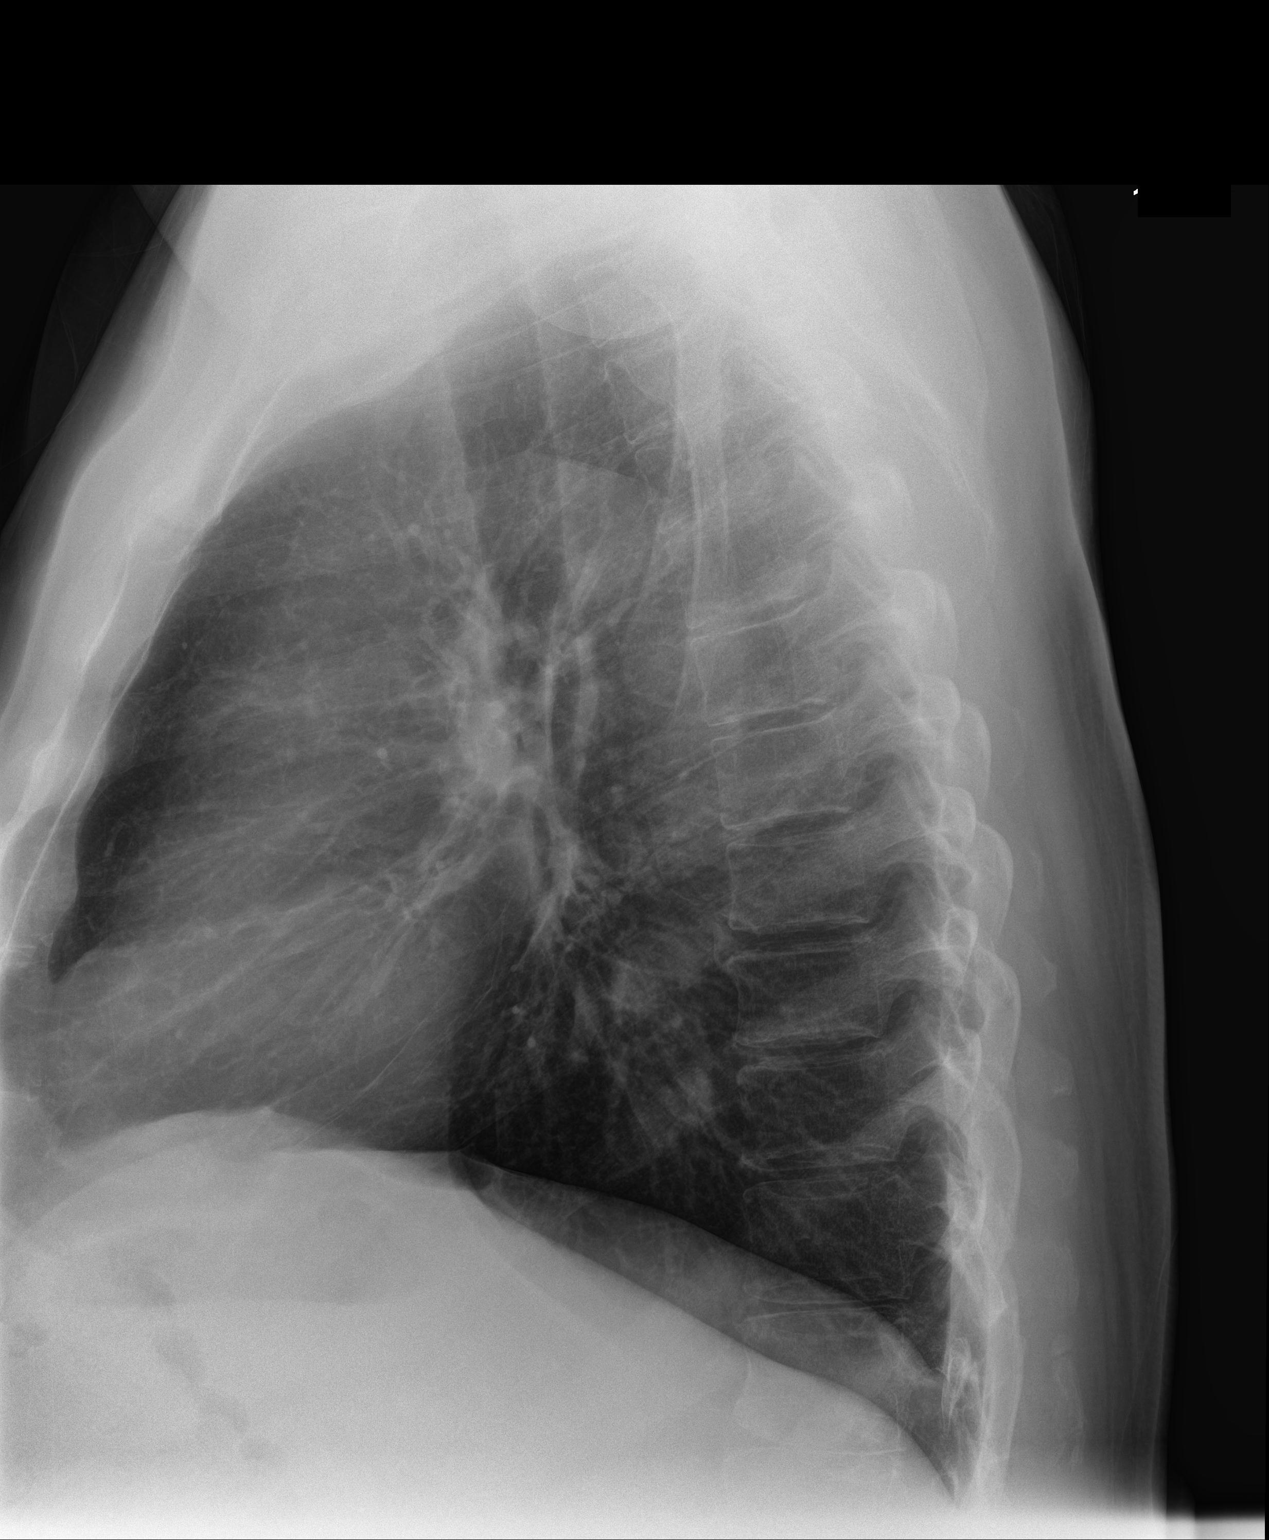

[3 of 3 positions shown; findings below may reference images not displayed]

FINDINGS: Cardiomediastinal silhouette unchanged in size and contour. No
evidence of central vascular congestion. No interlobular septal
thickening.

No pneumothorax or pleural effusion. Coarsened interstitial
markings, with no confluent airspace disease.

No acute displaced fracture. Degenerative changes of the spine.
IMPRESSION: No active cardiopulmonary disease.

## 2022-08-22 ENCOUNTER — Encounter (HOSPITAL_COMMUNITY): Payer: Self-pay

## 2022-08-22 ENCOUNTER — Telehealth: Payer: Self-pay

## 2022-08-22 ENCOUNTER — Encounter (HOSPITAL_COMMUNITY)
Admission: RE | Admit: 2022-08-22 | Discharge: 2022-08-22 | Disposition: A | Discharge status: Home / Self Care | Payer: Medicare PPO | Source: Ambulatory Visit | Attending: Neurosurgery | Admitting: Neurosurgery

## 2022-08-22 ENCOUNTER — Other Ambulatory Visit: Payer: Self-pay

## 2022-08-22 ENCOUNTER — Telehealth: Payer: Self-pay | Admitting: Family Medicine

## 2022-08-22 VITALS — BP 145/85 | HR 65 | Temp 97.9°F | Resp 17 | Ht 73.0 in | Wt 243.8 lb

## 2022-08-22 DIAGNOSIS — I480 Paroxysmal atrial fibrillation: Secondary | ICD-10-CM | POA: Insufficient documentation

## 2022-08-22 DIAGNOSIS — E785 Hyperlipidemia, unspecified: Secondary | ICD-10-CM | POA: Insufficient documentation

## 2022-08-22 DIAGNOSIS — I340 Nonrheumatic mitral (valve) insufficiency: Secondary | ICD-10-CM | POA: Insufficient documentation

## 2022-08-22 DIAGNOSIS — I10 Essential (primary) hypertension: Secondary | ICD-10-CM | POA: Insufficient documentation

## 2022-08-22 DIAGNOSIS — Z01818 Encounter for other preprocedural examination: Secondary | ICD-10-CM | POA: Insufficient documentation

## 2022-08-22 DIAGNOSIS — R011 Cardiac murmur, unspecified: Secondary | ICD-10-CM | POA: Diagnosis not present

## 2022-08-22 LAB — BASIC METABOLIC PANEL
Anion gap: 11 (ref 5–15)
BUN: 13 mg/dL (ref 8–23)
CO2: 27 mmol/L (ref 22–32)
Calcium: 9.3 mg/dL (ref 8.9–10.3)
Chloride: 103 mmol/L (ref 98–111)
Creatinine, Ser: 1.03 mg/dL (ref 0.61–1.24)
GFR, Estimated: >60 mL/min (ref >60)
Glucose, Bld: 140 mg/dL — ABNORMAL HIGH (ref 70–99)
Potassium: 3.7 mmol/L (ref 3.5–5.1)
Sodium: 141 mmol/L (ref 135–145)

## 2022-08-22 LAB — CBC
HCT: 43.2 % (ref 39.0–52.0)
Hemoglobin: 13.9 g/dL (ref 13.0–17.0)
MCH: 27 pg (ref 26.0–34.0)
MCHC: 32.2 g/dL (ref 30.0–36.0)
MCV: 84 fL (ref 80.0–100.0)
Platelets: 167 10*3/uL (ref 150–400)
RBC: 5.14 MIL/uL (ref 4.22–5.81)
RDW: 13.9 % (ref 11.5–15.5)
WBC: 6 10*3/uL (ref 4.0–10.5)
nRBC: 0 % (ref 0.0–0.2)

## 2022-08-22 LAB — TYPE AND SCREEN
ABO/RH(D): O NEG
Antibody Screen: NEGATIVE

## 2022-08-22 LAB — SURGICAL PCR SCREEN
MRSA, PCR: NEGATIVE
Staphylococcus aureus: POSITIVE — AB

## 2022-08-22 NOTE — Telephone Encounter (Signed)
Pt needs an echocardiogram, he has a heart murmor and AFIB. ASAP He is scheduled for a lumbar fusion on the 8th of November.   Karoline Caldwell  Best contact: [phone]

## 2022-08-22 NOTE — Progress Notes (Signed)
PCP - Dr. Otilio Miu Cardiologist - Dr. Serafina Royals  PPM/ICD - n/a  Chest x-ray - n/a EKG - 03/22/22-requested from Dr. Nehemiah Massed Stress Test - 2018 ECHO - denies Cardiac Cath - denies  Sleep Study - denies CPAP - denies  Last dose of GLP1 agonist-  n/a GLP1 instructions: n/a  Blood Thinner Instructions: Eliquis, hold 5 days pre-op. LD will be 08/25/22. Aspirin Instructions: n/a  NPO at MD  COVID TEST- n/a  Anesthesia review: Yes, Karoline Caldwell evaluated pt with a history of heart murmur with no ECHO reports. After auscultating, Jeneen Rinks determined that pt might need to have an echo done before surgery. Jeneen Rinks has reached out to Dr. Alveria Apley office and Dr. Windy Carina office.   Patient denies shortness of breath, fever, cough and chest pain at PAT appointment   All instructions explained to the patient, with a verbal understanding of the material. Patient agrees to go over the instructions while at home for a better understanding. Patient also instructed to self quarantine after being tested for COVID-19. The opportunity to ask questions was provided.

## 2022-08-22 NOTE — Telephone Encounter (Signed)
Karoline Caldwell called asking Ronnald Ramp to order an echocardiogram for pt prior to having his November 8th surgery/fusion. I was informed by Jeneen Rinks that he had tried to get through to cardiology and ask for the echo with them. I called cardiology and put a message through to Dr Nehemiah Massed and Jettie Booze to order the echocardiogram or give the surgery clearance to [name], so pt can proceed with his surgery on time. I left Mr [name]' name and number for them to call him. [phone]. I also called Jeneen Rinks back and told him I gave the message to cardio.

## 2022-08-22 NOTE — Pre-Procedure Instructions (Signed)
Surgical Instructions    Your procedure is scheduled on Wednesday, November 8th.  Report to [facility] Main Entrance "A" at 6:30 A.M., then check in with the Admitting office.  Call this number if you have problems the morning of surgery:  [phone]   If you have any questions prior to your surgery date call [phone]: Open Monday-Friday 8am-4pm    Remember:  Do not eat or drink after midnight the night before your surgery    Take these medicines the morning of surgery with A SIP OF WATER  diltiazem (CARDIZEM CD) fexofenadine (ALLEGRA)  metoprolol succinate (TOPROL-XL)  rosuvastatin (CRESTOR)-if this is a scheduled day tamsulosin (FLOMAX)   Take these medicines AS NEEDED the morning of surgery: acetaminophen (TYLENOL)  fluticasone (FLONASE)  Eye drops traMADol (ULTRAM)   Follow your surgeon's instructions on when to stop apixaban (ELIQUIS).  If no instructions were given by your surgeon then you will need to call the office to get those instructions.    As of today, STOP taking any Aspirin (unless otherwise instructed by your surgeon) Aleve, Naproxen, Ibuprofen, Motrin, Advil, Goody's, BC's, all herbal medications, fish oil, and all vitamins. This includes: celecoxib (CELEBREX).                       Do NOT Smoke (Tobacco/Vaping) for 24 hours prior to your procedure.  If you use a CPAP at night, you may bring your mask/headgear for your overnight stay.   Contacts, glasses, piercing's, hearing aid's, dentures or partials may not be worn into surgery, please bring cases for these belongings.    For patients admitted to the hospital, discharge time will be determined by your treatment team.   Patients discharged the day of surgery will not be allowed to drive home, and someone needs to stay with them for 24 hours.  SURGICAL WAITING ROOM VISITATION Patients having surgery or a procedure may have no more than 2 support people in the waiting area - these visitors may  rotate.   Children under the age of 43 must have an adult with them who is not the patient. If the patient needs to stay at the hospital during part of their recovery, the visitor guidelines for inpatient rooms apply. Pre-op nurse will coordinate an appropriate time for 1 support person to accompany patient in pre-op.  This support person may not rotate.   Please refer to the [facility] website for the visitor guidelines for Inpatients (after your surgery is over and you are in a regular room).    Special instructions:   [facility]- Preparing For Surgery  Before surgery, you can play an important role. Because skin is not sterile, your skin needs to be as free of germs as possible. You can reduce the number of germs on your skin by washing with CHG (chlorahexidine gluconate) Soap before surgery.  CHG is an antiseptic cleaner which kills germs and bonds with the skin to continue killing germs even after washing.    Oral Hygiene is also important to reduce your risk of infection.  Remember - BRUSH YOUR TEETH THE MORNING OF SURGERY WITH YOUR REGULAR TOOTHPASTE  Please do not use if you have an allergy to CHG or antibacterial soaps. If your skin becomes reddened/irritated stop using the CHG.  Do not shave (including legs and underarms) for at least 48 hours prior to first CHG shower. It is OK to shave your face.  Please follow these instructions carefully.   Shower the  NIGHT BEFORE SURGERY and the MORNING OF SURGERY  If you chose to wash your hair, wash your hair first as usual with your normal shampoo.  After you shampoo, rinse your hair and body thoroughly to remove the shampoo.  Use CHG Soap as you would any other liquid soap. You can apply CHG directly to the skin and wash gently with a scrungie or a clean washcloth.   Apply the CHG Soap to your body ONLY FROM THE NECK DOWN.  Do not use on open wounds or open sores. Avoid contact with your eyes, ears, mouth and genitals (private parts).  Wash Face and genitals (private parts)  with your normal soap.   Wash thoroughly, paying special attention to the area where your surgery will be performed.  Thoroughly rinse your body with warm water from the neck down.  DO NOT shower/wash with your normal soap after using and rinsing off the CHG Soap.  Pat yourself dry with a CLEAN TOWEL.  Wear CLEAN PAJAMAS to bed the night before surgery  Place CLEAN SHEETS on your bed the night before your surgery  DO NOT SLEEP WITH PETS.   Day of Surgery: Take a shower with CHG soap. Do not wear jewelry. Do not wear lotions, powders, colognes, or deodorant. Men may shave face and neck. Do not bring valuables to the hospital.  [facility] is not responsible for any belongings or valuables. Wear Clean/Comfortable clothing the morning of surgery Remember to brush your teeth WITH YOUR REGULAR TOOTHPASTE.   Please read over the following fact sheets that you were given.    If you received a COVID test during your pre-op visit  it is requested that you wear a mask when out in public, stay away from anyone that may not be feeling well and notify your surgeon if you develop symptoms. If you have been in contact with anyone that has tested positive in the last 10 days please notify you surgeon.

## 2022-08-24 ENCOUNTER — Ambulatory Visit: Payer: Medicare PPO | Admitting: Family Medicine

## 2022-08-24 ENCOUNTER — Encounter: Payer: Self-pay | Admitting: Family Medicine

## 2022-08-24 VITALS — BP 128/94 | HR 64 | Ht 73.0 in | Wt 244.0 lb

## 2022-08-24 DIAGNOSIS — L03012 Cellulitis of left finger: Secondary | ICD-10-CM

## 2022-08-24 DIAGNOSIS — Z23 Encounter for immunization: Secondary | ICD-10-CM

## 2022-08-24 DIAGNOSIS — E876 Hypokalemia: Secondary | ICD-10-CM | POA: Diagnosis not present

## 2022-08-24 DIAGNOSIS — K219 Gastro-esophageal reflux disease without esophagitis: Secondary | ICD-10-CM | POA: Diagnosis not present

## 2022-08-24 DIAGNOSIS — I480 Paroxysmal atrial fibrillation: Secondary | ICD-10-CM | POA: Diagnosis not present

## 2022-08-24 DIAGNOSIS — R7303 Prediabetes: Secondary | ICD-10-CM | POA: Diagnosis not present

## 2022-08-24 DIAGNOSIS — R601 Generalized edema: Secondary | ICD-10-CM

## 2022-08-24 DIAGNOSIS — I38 Endocarditis, valve unspecified: Secondary | ICD-10-CM

## 2022-08-24 DIAGNOSIS — I10 Essential (primary) hypertension: Secondary | ICD-10-CM

## 2022-08-24 DIAGNOSIS — J302 Other seasonal allergic rhinitis: Secondary | ICD-10-CM | POA: Diagnosis not present

## 2022-08-24 DIAGNOSIS — E782 Mixed hyperlipidemia: Secondary | ICD-10-CM | POA: Diagnosis not present

## 2022-08-24 MED ORDER — POTASSIUM CHLORIDE CRYS ER 20 MEQ PO TBCR: 20.0000 meq | EXTENDED_RELEASE_TABLET | ORAL | 1 refills | Status: DC

## 2022-08-24 MED ORDER — HYDROCHLOROTHIAZIDE 12.5 MG PO TABS: 12.5000 mg | ORAL_TABLET | Freq: Every day | ORAL | 1 refills | Status: DC

## 2022-08-24 MED ORDER — MONTELUKAST SODIUM 10 MG PO TABS: 10.0000 mg | ORAL_TABLET | Freq: Every day | ORAL | 0 refills | Status: DC

## 2022-08-24 MED ORDER — DILTIAZEM HCL ER COATED BEADS 180 MG PO CP24: 180.0000 mg | ORAL_CAPSULE | Freq: Two times a day (BID) | ORAL | 1 refills | Status: DC

## 2022-08-24 MED ORDER — VALSARTAN 320 MG PO TABS: 320.0000 mg | ORAL_TABLET | Freq: Every day | ORAL | 1 refills | Status: DC

## 2022-08-24 MED ORDER — PANTOPRAZOLE SODIUM 40 MG PO TBEC: 40.0000 mg | DELAYED_RELEASE_TABLET | Freq: Every evening | ORAL | 1 refills | Status: DC

## 2022-08-24 MED ORDER — FEXOFENADINE HCL 180 MG PO TABS: 180.0000 mg | ORAL_TABLET | Freq: Every day | ORAL | 1 refills | Status: DC

## 2022-08-24 MED ORDER — MUPIROCIN 2 % EX OINT: 1.0000 | TOPICAL_OINTMENT | Freq: Two times a day (BID) | CUTANEOUS | 0 refills | Status: DC

## 2022-08-24 MED ORDER — ROSUVASTATIN CALCIUM 5 MG PO TABS: ORAL_TABLET | ORAL | 1 refills | Status: DC

## 2022-08-24 MED ORDER — METOPROLOL SUCCINATE ER 25 MG PO TB24: 25.0000 mg | ORAL_TABLET | Freq: Every day | ORAL | 1 refills | Status: DC

## 2022-08-24 NOTE — Progress Notes (Unsigned)
Date:  08/24/2022   Name:  [name]   DOB:  10-Sep-1956   MRN:  [id number]   Chief Complaint: Hyperlipidemia, Hypertension, Allergic Rhinitis , hypokalemia, pneumonia vacc need, Gastroesophageal Reflux, and Prediabetes  Hyperlipidemia This is a chronic problem. The current episode started more than 1 year ago. The problem is controlled. He has no history of chronic renal disease. Pertinent negatives include no chest pain, focal sensory loss, myalgias or shortness of breath. Current antihyperlipidemic treatment includes statins. The current treatment provides moderate improvement of lipids. There are no compliance problems.  There are no known risk factors for coronary artery disease.  Hypertension This is a chronic problem. The current episode started more than 1 year ago. The problem has been gradually improving since onset. The problem is controlled (cardiology 120/90). Pertinent negatives include no anxiety, blurred vision, chest pain, headaches, malaise/fatigue, neck pain, orthopnea, palpitations, peripheral edema, PND, shortness of breath or sweats. There are no associated agents to hypertension. There are no known risk factors for coronary artery disease. Past treatments include diuretics, calcium channel blockers and beta blockers. The current treatment provides moderate improvement. There are no compliance problems.  There is no history of angina, kidney disease, CAD/MI, CVA, heart failure, left ventricular hypertrophy, PVD or retinopathy. There is no history of chronic renal disease, a hypertension causing med or renovascular disease.  Gastroesophageal Reflux He reports no abdominal pain, no chest pain, no coughing, no heartburn, no nausea, no sore throat or no wheezing. The problem has been gradually improving. He has tried a PPI for the symptoms.    Lab Results  Component Value Date   NA 141 08/22/2022   K 3.7 08/22/2022   CO2 27 08/22/2022   GLUCOSE 140 (H) 08/22/2022    BUN 13 08/22/2022   CREATININE 1.03 08/22/2022   CALCIUM 9.3 08/22/2022   EGFR 85 02/21/2022   GFRNONAA >60 08/22/2022   Lab Results  Component Value Date   CHOL 128 06/15/2022   HDL 46 06/15/2022   LDLCALC 67 06/15/2022   TRIG 76 06/15/2022   CHOLHDL 4.1 08/07/2019   Lab Results  Component Value Date   TSH 0.569 02/21/2022   Lab Results  Component Value Date   HGBA1C 6.2 (H) 02/21/2022   Lab Results  Component Value Date   WBC 6.0 08/22/2022   HGB 13.9 08/22/2022   HCT 43.2 08/22/2022   MCV 84.0 08/22/2022   PLT 167 08/22/2022   Lab Results  Component Value Date   ALT 16 06/15/2022   AST 13 06/15/2022   ALKPHOS 96 06/15/2022   BILITOT 0.4 06/15/2022   No results found for: "25OHVITD2", "25OHVITD3", "VD25OH"   Review of Systems  Constitutional:  Negative for chills, fever and malaise/fatigue.  HENT:  Negative for drooling, ear discharge, ear pain and sore throat.   Eyes:  Negative for blurred vision.  Respiratory:  Negative for cough, shortness of breath and wheezing.   Cardiovascular:  Negative for chest pain, palpitations, orthopnea, leg swelling and PND.  Gastrointestinal:  Negative for abdominal pain, blood in stool, constipation, diarrhea, heartburn and nausea.  Endocrine: Negative for polydipsia.  Genitourinary:  Negative for dysuria, frequency, hematuria and urgency.  Musculoskeletal:  Negative for back pain, myalgias and neck pain.  Skin:  Negative for rash.  Allergic/Immunologic: Negative for environmental allergies.  Neurological:  Negative for dizziness and headaches.  Hematological:  Does not bruise/bleed easily.  Psychiatric/Behavioral:  Negative for suicidal ideas. The patient is not nervous/anxious.  Patient Active Problem List   Diagnosis Date Noted   Hypokalemia 08/24/2021   Bacteremia due to Gram-negative bacteria    AF (paroxysmal atrial fibrillation) (HCC)    SIRS (systemic inflammatory response syndrome) ([place]) 07/27/2021   Sepsis  secondary to UTI ([place]) 07/27/2021   Ureteral stricture 06/18/2021   Sacroiliitis ([place]) 05/07/2021   Spinal stenosis of lumbosacral region 01/18/2021   Herniated nucleus pulposus, lumbar 03/16/2020   Body mass index (BMI) 33.0-33.9, adult 09/03/2019   Other intervertebral disc displacement, lumbar region 06/25/2019   HNP (herniated nucleus pulposus), lumbar 06/12/2019   UTI due to Klebsiella species 05/08/2019   Biceps tendonitis on right 07/27/2018   Spinal stenosis, 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   Lumbago with sciatica, unspecified side 11/14/2017   Primary osteoarthritis of right knee 10/12/2017   Taking multiple medications for chronic disease 07/30/2015   Bronchitis 07/01/2015   Reactive airway disease 07/01/2015   Allergic sinusitis 07/01/2015   Essential hypertension 07/01/2015   Valvular heart disease 07/01/2015   Metatarsalgia of left foot 06/10/2015   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    Allergies  Allergen Reactions   Meloxicam Palpitations   Sulfa Antibiotics Rash    Past Surgical History:  Procedure Laterality Date   ABDOMINAL EXPOSURE N/A 01/18/2021   Procedure: ABDOMINAL EXPOSURE;  Surgeon: [name], MD;  Location: [facility];  Service: Vascular;  Laterality: N/A;   ANTERIOR LUMBAR FUSION N/A 01/18/2021   Procedure: Anterior Lumbar Interbody Fusion - Lumbar Five-Sarcal One;  Surgeon: Kary Kos, MD;  Location: [facility];  Service: Neurosurgery;  Laterality: N/A;  Anterior Lumbar Interbody Fusion - Lumbar Five-Sacral One   CYSTOSCOPY W/ URETERAL STENT PLACEMENT Left 06/18/2021   Procedure: CYSTOSCOPY WITH RETROGRADE PYELOGRAM/URETERAL STENT EXCHANGE;  Surgeon: Alexis Frock, MD;  Location: [facility];  Service: Urology;  Laterality: Left;   CYSTOSCOPY WITH RETROGRADE PYELOGRAM, URETEROSCOPY AND STENT PLACEMENT Left 04/21/2021   Procedure: CYSTOSCOPY WITH RETROGRADE PYELOGRAM, DIAGNOSTICURETEROSCOPY AND STENT PLACEMENT;  Surgeon: Alexis Frock, MD;   Location: [facility];  Service: Urology;  Laterality: Left;  1 HR   EXTRACORPOREAL SHOCK WAVE LITHOTRIPSY     approx 2002   LUMBAR [place] SURGERY  2018   REPAIR DURAL / CSF LEAK  2018   post op lumbar diskectomy   REVISION TOTAL KNEE ARTHROPLASTY Left 2015   TOTAL KNEE ARTHROPLASTY Left 2012   TRANSFORAMINAL LUMBAR INTERBODY FUSION (TLIF) WITH PEDICLE SCREW FIXATION 1 LEVEL N/A 06/12/2019   Procedure: LUMBAR FOUR-FIVE TRANSFORAMINAL LUMBAR INTERBODY FUSION (TLIF) WITH PEDICLE SCREW FIXATION;  Surgeon: Kary Kos, MD;  Location: [place];  Service: Neurosurgery;  Laterality: N/A;   UMBILICAL HERNIA REPAIR  1992    Social History   Tobacco Use   Smoking status: Never   Smokeless tobacco: Former    Types: Chew    Quit date: 03/2020  Vaping Use   Vaping Use: Never used  Substance Use Topics   Alcohol use: Yes    Comment: ocassional   Drug use: Never     Medication list has been reviewed and updated.  Current Meds  Medication Sig   acetaminophen (TYLENOL) 325 MG tablet Take 650 mg by mouth every 6 (six) hours as needed for moderate pain.   apixaban (ELIQUIS) 5 MG TABS tablet Take 5 mg by mouth 2 (two) times daily.   celecoxib (CELEBREX) 200 MG capsule Take 200 mg by mouth daily.   diltiazem (CARDIZEM CD) 180 MG 24 hr capsule Take 1 capsule (180 mg total)  by mouth 2 (two) times daily.   ferrous sulfate 325 (65 FE) MG tablet Take 325 mg by mouth daily with breakfast.   fexofenadine (ALLEGRA) 180 MG tablet Take 1 tablet (180 mg total) by mouth daily.   fluticasone (FLONASE) 50 MCG/ACT nasal spray Place 2 sprays into both nostrils daily. (Patient taking differently: Place 2 sprays into both nostrils daily as needed for allergies.)   hydrochlorothiazide (HYDRODIURIL) 12.5 MG tablet Take 1 tablet (12.5 mg total) by mouth daily.   metoprolol succinate (TOPROL-XL) 25 MG 24 hr tablet Take 1 tablet (25 mg total) by mouth daily.   montelukast (SINGULAIR) 10 MG tablet TAKE 1  TABLET BY MOUTH AT BEDTIME   Multiple Vitamins-Minerals (MULTIVITAMIN MEN) TABS Take 1 tablet by mouth daily.   Olopatadine HCl 0.2 % SOLN Place 1 drop into both eyes 2 (two) times daily as needed (allergies).   pantoprazole (PROTONIX) 40 MG tablet Take 1 tablet (40 mg total) by mouth every evening.   potassium chloride SA (KLOR-CON M) 20 MEQ tablet Take 1-2 tablets (20-40 mEq total) by mouth 2 (two) times daily. Takes one tablet in am and two tabs in pm (Patient taking differently: Take 20-40 mEq by mouth See admin instructions. Take 20 mEq by mouth in the morning and 40 mEq in the evening)   rosuvastatin (CRESTOR) 5 MG tablet TAKE 1 TABLET BY MOUTH THREE TIMES A WEEK   tadalafil (CIALIS) 5 MG tablet Take 1-2 tablets (5-10 mg total) by mouth daily as needed for erectile dysfunction.   tamsulosin (FLOMAX) 0.4 MG CAPS capsule Take 1 capsule (0.4 mg total) by mouth daily.   traMADol (ULTRAM) 50 MG tablet Take 1 tablet by mouth every 6 (six) hours as needed for moderate pain.   valsartan (DIOVAN) 320 MG tablet Take 1 tablet by mouth once daily       08/24/2022   10:05 AM 06/21/2022   10:59 AM 05/24/2022    8:25 AM 03/22/2022   10:20 AM  GAD 7 : Generalized Anxiety Score  Nervous, Anxious, on Edge 0 0 0 0  Control/stop worrying 0 0 0 0  Worry too much - different things 0 0 0 0  Trouble relaxing 0 0 0 0  Restless 0 0 0 0  Easily annoyed or irritable 0 0 0 0  Afraid - awful might happen 0 0 0 0  Total GAD 7 Score 0 0 0 0  Anxiety Difficulty Not difficult at all Not difficult at all Not difficult at all Not difficult at all       08/24/2022   10:05 AM 06/21/2022   10:59 AM 05/24/2022    8:25 AM  Depression screen PHQ 2/9  Decreased Interest 0 0 0  Down, Depressed, Hopeless 0 0 0  PHQ - 2 Score 0 0 0  Altered sleeping 0 0 0  Tired, decreased energy 0 0 0  Change in appetite 0 0 0  Feeling bad or failure about yourself  0 0 0  Trouble concentrating 0 0 0  Moving slowly or fidgety/restless  0 0 0  Suicidal thoughts 0 0 0  PHQ-9 Score 0 0 0  Difficult doing work/chores Not difficult at all Not difficult at all Not difficult at all    BP Readings from Last 3 Encounters:  08/24/22 (!) 128/94  08/22/22 (!) 145/85  06/21/22 120/80    Physical Exam Vitals and nursing note reviewed.  HENT:     Head: Normocephalic.     Right  Ear: External ear normal.     Left Ear: External ear normal.     Nose: Nose normal.  Eyes:     General: No scleral icterus.       Right eye: No discharge.        Left eye: No discharge.     Conjunctiva/sclera: Conjunctivae normal.     Pupils: Pupils are equal, round, and reactive to light.  Neck:     Thyroid: No thyromegaly.     Vascular: No JVD.     Trachea: No tracheal deviation.  Cardiovascular:     Rate and Rhythm: Normal rate and regular rhythm.     Heart sounds: Normal heart sounds. No murmur heard.    No friction rub. No gallop.  Pulmonary:     Effort: No respiratory distress.     Breath sounds: Normal breath sounds. No wheezing, rhonchi or rales.  Abdominal:     General: Bowel sounds are normal.     Palpations: Abdomen is soft. There is no mass.     Tenderness: There is no abdominal tenderness. There is no guarding or rebound.  Musculoskeletal:        General: No tenderness. Normal range of motion.     Cervical back: Normal range of motion and neck supple.  Lymphadenopathy:     Cervical: No cervical adenopathy.  Skin:    General: Skin is warm.     Findings: No rash.  Neurological:     Mental Status: He is alert and oriented to person, place, and time.     Cranial Nerves: No cranial nerve deficit.     Deep Tendon Reflexes: Reflexes are normal and symmetric.     Wt Readings from Last 3 Encounters:  08/24/22 244 lb (110.7 kg)  08/22/22 243 lb 12.8 oz (110.6 kg)  06/21/22 244 lb (110.7 kg)    BP (!) 128/94   Pulse 64   Ht _0  (1.854 m)   Wt 244 lb (110.7 kg)   SpO2 99%   BMI 32.19 kg/m  I spent 45 minutes with this  patient, More than 50% of that time was spent in face to face education, counseling and care coordination.  Assessment and Plan:  1. Essential hypertension Chronic.  Controlled.  Stable.  Blood pressure elevated at 128/94 but was in normal range at cardiology.  We will continue diltiazem 180 mg 1 twice a day, hydrochlorothiazide 12.5 mg once a day, metoprolol XL 25 mg once a day, and valsartan 320 mg once a day.  Patient is being followed for blood pressure with cardiology as well and we will await his echo to see if there is any changes to be needing to be made. - diltiazem (CARDIZEM CD) 180 MG 24 hr capsule; Take 1 capsule (180 mg total) by mouth 2 (two) times daily.  Dispense: 180 capsule; Refill: 1 - hydrochlorothiazide (HYDRODIURIL) 12.5 MG tablet; Take 1 tablet (12.5 mg total) by mouth daily.  Dispense: 90 tablet; Refill: 1 - metoprolol succinate (TOPROL-XL) 25 MG 24 hr tablet; Take 1 tablet (25 mg total) by mouth daily.  Dispense: 90 tablet; Refill: 1 - valsartan (DIOVAN) 320 MG tablet; Take 1 tablet (320 mg total) by mouth daily.  Dispense: 90 tablet; Refill: 1  2. Other seasonal allergic rhinitis .  Controlled.  Stable.  Continue Allegra 180 mg once a day. - fexofenadine (ALLEGRA) 180 MG tablet; Take 1 tablet (180 mg total) by mouth daily.  Dispense: 90 tablet; Refill: 1  3. Generalized  edema Chronic.  Controlled.  Stable.  Continue hydrochlorothiazide 12.5 mg once a day. - hydrochlorothiazide (HYDRODIURIL) 12.5 MG tablet; Take 1 tablet (12.5 mg total) by mouth daily.  Dispense: 90 tablet; Refill: 1  4. Valvular heart disease .  Controlled.  Stable.  Followed by cardiology and we will continue hydrochlorothiazide 12.5 mg once a day. - hydrochlorothiazide (HYDRODIURIL) 12.5 MG tablet; Take 1 tablet (12.5 mg total) by mouth daily.  Dispense: 90 tablet; Refill: 1  5. Seasonal allergic rhinitis, unspecified trigger Chronic.  Controlled.  Stable.  Continue Singulair 10 mg once a day. -  montelukast (SINGULAIR) 10 MG tablet; Take 1 tablet (10 mg total) by mouth at bedtime.  Dispense: 90 tablet; Refill: 0  6. Gastroesophageal reflux disease, unspecified whether esophagitis present .  Controlled.  Stable.  Continue pantoprazole 40 mg once a day. - pantoprazole (PROTONIX) 40 MG tablet; Take 1 tablet (40 mg total) by mouth every evening.  Dispense: 90 tablet; Refill: 1  7. Hypokalemia History of hypokalemia probably secondary to hydrochlorothiazide.  We will check potassium and adjust accordingly. - potassium chloride SA (KLOR-CON M) 20 MEQ tablet; Take 1-2 tablets (20-40 mEq total) by mouth See admin instructions. Take 20 mEq by mouth in the morning and 40 mEq in the evening  Dispense: 270 tablet; Refill: 1  8. Mixed hyperlipidemia Chronic.  Controlled.  Stable.  Continue rosuvastatin 5 mg 3 times a week. - rosuvastatin (CRESTOR) 5 MG tablet; TAKE 1 TABLET BY MOUTH THREE TIMES A WEEK  Dispense: 36 tablet; Refill: 1  9. Prediabetes Chronic.  Controlled.  Stable.  Continue with present control of prediabetes from a dietary standpoint. - HgB A1c  10. Need for pneumococcal vaccination Gust and administered. - Pneumococcal conjugate vaccine 20-valent (Prevnar 20)  11. Paronychia of finger, left New onset.  Persistent.  Stable.  Patient has upcoming surgery and we would like to go ahead and get this treated.  Patient has an early cuticle erythema infection consistent with a local infection but no abscess is noted.  We will treat with some Bactroban ointment so as to remain in a topical direction for this control. - mupirocin ointment (BACTROBAN) 2 %; Apply 1 Application topically 2 (two) times daily.  Dispense: 22 g; Refill: 0    Otilio Miu, MD

## 2022-08-25 ENCOUNTER — Encounter: Payer: Self-pay | Admitting: Family Medicine

## 2022-08-25 DIAGNOSIS — I480 Paroxysmal atrial fibrillation: Secondary | ICD-10-CM | POA: Diagnosis not present

## 2022-08-25 LAB — HEMOGLOBIN A1C
Est. average glucose Bld gHb Est-mCnc: 128 mg/dL
Hgb A1c MFr Bld: 6.1 % — ABNORMAL HIGH (ref 4.8–5.6)

## 2022-08-30 NOTE — Anesthesia Preprocedure Evaluation (Signed)
Anesthesia Evaluation  Patient identified by MRN, date of birth, ID band Patient awake    Reviewed: Allergy & Precautions, NPO status , Patient's Chart, lab work & pertinent test results, reviewed documented beta blocker date and time   History of Anesthesia Complications Negative for: history of anesthetic complications  Airway Mallampati: II  TM Distance: >3 FB Neck ROM: Full    Dental  (+) Teeth Intact, Dental Advisory Given   Pulmonary neg pulmonary ROS, neg shortness of breath, neg sleep apnea, neg COPD, neg recent URI   breath sounds clear to auscultation       Cardiovascular hypertension, Pt. on medications and Pt. on home beta blockers (-) angina (-) Past MI and (-) CHF + dysrhythmias Atrial Fibrillation  Rhythm:Regular     Neuro/Psych neg Seizures  Neuromuscular disease  negative psych ROS   GI/Hepatic Neg liver ROS,GERD  ,,  Endo/Other  negative endocrine ROS    Renal/GU negative Renal ROSLab Results      Component                Value               Date                      CREATININE               1.03                08/22/2022                Musculoskeletal  (+) Arthritis ,    Abdominal   Peds  Hematology Eliquis (last dose 10/28)  Lab Results      Component                Value               Date                      WBC                      6.0                 08/22/2022                HGB                      13.9                08/22/2022                HCT                      43.2                08/22/2022                MCV                      84.0                08/22/2022                PLT                      167  08/22/2022              Anesthesia Other Findings   Reproductive/Obstetrics                             Anesthesia Physical Anesthesia Plan  ASA: 2  Anesthesia Plan: General   Post-op Pain Management: Ofirmev IV (intra-op)* and  Toradol IV (intra-op)*   Induction: Intravenous  PONV Risk Score and Plan: 2 and Ondansetron, Midazolam and Amisulpride  Airway Management Planned: Oral ETT  Additional Equipment: None  Intra-op Plan:   Post-operative Plan: Extubation in OR  Informed Consent: I have reviewed the patients History and Physical, chart, labs and discussed the procedure including the risks, benefits and alternatives for the proposed anesthesia with the patient or authorized representative who has indicated his/her understanding and acceptance.     Dental advisory given  Plan Discussed with: CRNA  Anesthesia Plan Comments: (PAT note by Karoline Caldwell, PA-C: Evaluated patient at PAT appointment due to reported history of murmur.  Patient states he follows with cardiologist Dr. Nehemiah Massed at [place] [facility] for history of hypertension, hyperlipidemia, paroxysmal atrial fibrillation on Eliquis.  He also reports he has been previously told he has a murmur, however this has not been evaluated.  Paroxysmal A-fib is also fairly recent diagnosis, within the past year.  He states he has not had an echocardiogram.  He has good functional status, states he recently did strenuous hiking in [place] and had no chest pain or undue shortness of breath.  On exam he is well-appearing, auscultation reveals a heart rate and rhythm with grade 2 out of 6 systolic murmur.  I advised the patient that given his murmur and recent diagnosis of paroxysmal atrial fibrillation he would benefit from having an echo prior to undergoing elective surgery.  Reached out to Dr. Alveria Apley office to help facilitate this.  Patient did have echo 08/25/2022 which showed normal left ventricular systolic function with mild LVH, mild MR, mild PR, moderately dilated ascending aorta measuring 4.0 cm, mildly dilated aortic root measuring 3.9 cm.  Given these findings and patient's good functional status anticipate he can proceed as planned barring acute status  change.  Patient reported last dose Eliquis 08/25/2022.  Preop labs reviewed, unremarkable.  EKG 03/22/2022 (copy on chart): NSR.  Rate 61.  Cannot rule out inferior infarct, age undetermined.  TTE 08/25/22 (copy on chart): Interpretation: Normal left ventricular systolic function with mild LVH. Normal right ventricular systolic function. Mild valvular regurgitation. No valvular stenosis. Irregular heart rhythm captured throughout exam. Estimated LVEF >55%. GLS: -22.4%. Aortic: Trivial AI. AoV: Mildly thickened, fully mobile leaflets; sclerotic AoV with no stenosis. Mitral: Mild MR. Tricuspid: Trivial TR. Pulmonic: Mild PI. Moderately dilated ascending aorta measuring 4.0 cm. Mildly dilated aortic root measuring 3.9 cm. Mild LAE.   )        Anesthesia Quick Evaluation

## 2022-08-30 NOTE — Progress Notes (Signed)
Anesthesia Chart Review:  Evaluated patient at PAT appointment due to reported history of murmur.  Patient states he follows with cardiologist Dr. Nehemiah Massed at [place] [facility] for history of hypertension, hyperlipidemia, paroxysmal atrial fibrillation on Eliquis.  He also reports he has been previously told he has a murmur, however this has not been evaluated.  Paroxysmal A-fib is also fairly recent diagnosis, within the past year.  He states he has not had an echocardiogram.  He has good functional status, states he recently did strenuous hiking in [place] and had no chest pain or undue shortness of breath.  On exam he is well-appearing, auscultation reveals a heart rate and rhythm with grade 2 out of 6 systolic murmur.  I advised the patient that given his murmur and recent diagnosis of paroxysmal atrial fibrillation he would benefit from having an echo prior to undergoing elective surgery.  Reached out to Dr. Alveria Apley office to help facilitate this.  Patient did have echo 08/25/2022 which showed normal left ventricular systolic function with mild LVH, mild MR, mild PR, moderately dilated ascending aorta measuring 4.0 cm, mildly dilated aortic root measuring 3.9 cm.  Given these findings and patient's good functional status anticipate he can proceed as planned barring acute status change.  Patient reported last dose Eliquis 08/25/2022.  Preop labs reviewed, unremarkable.  EKG 03/22/2022 (copy on chart): NSR.  Rate 61.  Cannot rule out inferior infarct, age undetermined.  TTE 08/25/22 (copy on chart): Interpretation: Normal left ventricular systolic function with mild LVH. Normal right ventricular systolic function. Mild valvular regurgitation. No valvular stenosis. Irregular heart rhythm captured throughout exam. Estimated LVEF >55%. GLS: -22.4%. Aortic: Trivial AI. AoV: Mildly thickened, fully mobile leaflets; sclerotic AoV with no stenosis. Mitral: Mild MR. Tricuspid: Trivial TR. Pulmonic:  Mild PI. Moderately dilated ascending aorta measuring 4.0 cm. Mildly dilated aortic root measuring 3.9 cm. Mild LAE.    Wynonia Musty [facility] Short Stay Center/Anesthesiology Phone [phone] 08/30/2022 4:05 PM

## 2022-08-31 ENCOUNTER — Inpatient Hospital Stay (HOSPITAL_COMMUNITY)
Admission: RE | Admit: 2022-08-31 | Discharge: 2022-09-01 | DRG: 460 | Disposition: A | Discharge status: Home / Self Care | Payer: Medicare PPO | Attending: Neurosurgery | Admitting: Neurosurgery

## 2022-08-31 ENCOUNTER — Encounter (HOSPITAL_COMMUNITY): Payer: Self-pay | Admitting: Neurosurgery

## 2022-08-31 ENCOUNTER — Inpatient Hospital Stay (HOSPITAL_COMMUNITY): Payer: Medicare PPO

## 2022-08-31 ENCOUNTER — Encounter (HOSPITAL_COMMUNITY)
Admission: RE | Disposition: A | Discharge status: Home / Self Care | Payer: Self-pay | Source: Home / Self Care | Attending: Neurosurgery

## 2022-08-31 ENCOUNTER — Inpatient Hospital Stay (HOSPITAL_COMMUNITY): Payer: Medicare PPO | Admitting: Physician Assistant

## 2022-08-31 ENCOUNTER — Other Ambulatory Visit: Payer: Self-pay

## 2022-08-31 ENCOUNTER — Inpatient Hospital Stay (HOSPITAL_COMMUNITY): Payer: Medicare PPO | Admitting: Certified Registered"

## 2022-08-31 DIAGNOSIS — Y793 Surgical instruments, materials and orthopedic devices (including sutures) associated with adverse incidents: Secondary | ICD-10-CM | POA: Diagnosis present

## 2022-08-31 DIAGNOSIS — Z83438 Family history of other disorder of lipoprotein metabolism and other lipidemia: Secondary | ICD-10-CM

## 2022-08-31 DIAGNOSIS — M4326 Fusion of spine, lumbar region: Secondary | ICD-10-CM | POA: Diagnosis not present

## 2022-08-31 DIAGNOSIS — M48061 Spinal stenosis, lumbar region without neurogenic claudication: Secondary | ICD-10-CM | POA: Diagnosis present

## 2022-08-31 DIAGNOSIS — I10 Essential (primary) hypertension: Secondary | ICD-10-CM | POA: Diagnosis present

## 2022-08-31 DIAGNOSIS — E782 Mixed hyperlipidemia: Secondary | ICD-10-CM | POA: Diagnosis not present

## 2022-08-31 DIAGNOSIS — N401 Enlarged prostate with lower urinary tract symptoms: Secondary | ICD-10-CM | POA: Diagnosis not present

## 2022-08-31 DIAGNOSIS — Y838 Other surgical procedures as the cause of abnormal reaction of the patient, or of later complication, without mention of misadventure at the time of the procedure: Secondary | ICD-10-CM | POA: Diagnosis present

## 2022-08-31 DIAGNOSIS — H9191 Unspecified hearing loss, right ear: Secondary | ICD-10-CM | POA: Diagnosis present

## 2022-08-31 DIAGNOSIS — M960 Pseudarthrosis after fusion or arthrodesis: Secondary | ICD-10-CM

## 2022-08-31 DIAGNOSIS — R351 Nocturia: Secondary | ICD-10-CM | POA: Diagnosis not present

## 2022-08-31 DIAGNOSIS — I480 Paroxysmal atrial fibrillation: Secondary | ICD-10-CM | POA: Diagnosis not present

## 2022-08-31 DIAGNOSIS — Z85828 Personal history of other malignant neoplasm of skin: Secondary | ICD-10-CM | POA: Diagnosis not present

## 2022-08-31 DIAGNOSIS — M5416 Radiculopathy, lumbar region: Secondary | ICD-10-CM | POA: Diagnosis not present

## 2022-08-31 DIAGNOSIS — Z7901 Long term (current) use of anticoagulants: Secondary | ICD-10-CM | POA: Diagnosis not present

## 2022-08-31 DIAGNOSIS — Z809 Family history of malignant neoplasm, unspecified: Secondary | ICD-10-CM

## 2022-08-31 DIAGNOSIS — S32009K Unspecified fracture of unspecified lumbar vertebra, subsequent encounter for fracture with nonunion: Secondary | ICD-10-CM | POA: Diagnosis present

## 2022-08-31 HISTORY — PX: LUMBAR LAMINECTOMY/DECOMPRESSION MICRODISCECTOMY: SHX5026

## 2022-08-31 SURGERY — POSTERIOR LUMBAR FUSION 1 LEVEL
Anesthesia: General | Site: Back | Laterality: Right

## 2022-08-31 MED ORDER — DEXAMETHASONE SODIUM PHOSPHATE 10 MG/ML IJ SOLN
INTRAMUSCULAR | Status: DC | PRN
  Administered 2022-08-31: 10 mg via INTRAVENOUS

## 2022-08-31 MED ORDER — ROSUVASTATIN CALCIUM 5 MG PO TABS
5.0000 mg | ORAL_TABLET | Freq: Every day | ORAL | Status: DC
  Filled 2022-08-31: qty 1

## 2022-08-31 MED ORDER — CHLORHEXIDINE GLUCONATE CLOTH 2 % EX PADS: 6.0000 | MEDICATED_PAD | Freq: Once | CUTANEOUS | Status: DC

## 2022-08-31 MED ORDER — ACETAMINOPHEN 160 MG/5ML PO SOLN: 1000.0000 mg | Freq: Once | ORAL | Status: DC | PRN

## 2022-08-31 MED ORDER — PHENOL 1.4 % MT LIQD: 1.0000 | OROMUCOSAL | Status: DC | PRN

## 2022-08-31 MED ORDER — LACTATED RINGERS IV SOLN: INTRAVENOUS | Status: DC

## 2022-08-31 MED ORDER — POTASSIUM CHLORIDE CRYS ER 20 MEQ PO TBCR: 40.0000 meq | EXTENDED_RELEASE_TABLET | Freq: Every evening | ORAL | Status: DC

## 2022-08-31 MED ORDER — LIDOCAINE-EPINEPHRINE 1 %-1:100000 IJ SOLN
INTRAMUSCULAR | Status: AC
  Filled 2022-08-31: qty 1

## 2022-08-31 MED ORDER — SODIUM CHLORIDE 0.9% FLUSH: 3.0000 mL | INTRAVENOUS | Status: DC | PRN

## 2022-08-31 MED ORDER — THROMBIN 5000 UNITS EX SOLR
CUTANEOUS | Status: AC
  Filled 2022-08-31: qty 5000

## 2022-08-31 MED ORDER — THROMBIN 20000 UNITS EX SOLR: CUTANEOUS | Status: DC | PRN

## 2022-08-31 MED ORDER — ONDANSETRON HCL 4 MG/2ML IJ SOLN: 4.0000 mg | Freq: Four times a day (QID) | INTRAMUSCULAR | Status: DC | PRN

## 2022-08-31 MED ORDER — ALBUMIN HUMAN 5 % IV SOLN: INTRAVENOUS | Status: DC | PRN

## 2022-08-31 MED ORDER — MIDAZOLAM HCL 2 MG/2ML IJ SOLN
INTRAMUSCULAR | Status: DC | PRN
  Administered 2022-08-31: 2 mg via INTRAVENOUS

## 2022-08-31 MED ORDER — PROPOFOL 10 MG/ML IV BOLUS
INTRAVENOUS | Status: DC | PRN
  Administered 2022-08-31: 140 mg via INTRAVENOUS

## 2022-08-31 MED ORDER — ALUM & MAG HYDROXIDE-SIMETH 200-200-20 MG/5ML PO SUSP: 30.0000 mL | Freq: Four times a day (QID) | ORAL | Status: DC | PRN

## 2022-08-31 MED ORDER — THROMBIN 20000 UNITS EX SOLR
CUTANEOUS | Status: AC
  Filled 2022-08-31: qty 20000

## 2022-08-31 MED ORDER — MIDAZOLAM HCL 2 MG/2ML IJ SOLN
INTRAMUSCULAR | Status: AC
  Filled 2022-08-31: qty 2

## 2022-08-31 MED ORDER — CHLORHEXIDINE GLUCONATE 0.12 % MT SOLN
15.0000 mL | Freq: Once | OROMUCOSAL | Status: AC
  Administered 2022-08-31: 15 mL via OROMUCOSAL
  Filled 2022-08-31: qty 15

## 2022-08-31 MED ORDER — PANTOPRAZOLE SODIUM 40 MG IV SOLR: 40.0000 mg | Freq: Every day | INTRAVENOUS | Status: DC

## 2022-08-31 MED ORDER — FENTANYL CITRATE (PF) 100 MCG/2ML IJ SOLN
INTRAMUSCULAR | Status: DC | PRN
  Administered 2022-08-31 (×3): 50 ug via INTRAVENOUS

## 2022-08-31 MED ORDER — FENTANYL CITRATE (PF) 100 MCG/2ML IJ SOLN
25.0000 ug | INTRAMUSCULAR | Status: DC | PRN
  Administered 2022-08-31: 50 ug via INTRAVENOUS

## 2022-08-31 MED ORDER — ONDANSETRON HCL 4 MG/2ML IJ SOLN
INTRAMUSCULAR | Status: AC
  Filled 2022-08-31: qty 2

## 2022-08-31 MED ORDER — PANTOPRAZOLE SODIUM 40 MG PO TBEC
40.0000 mg | DELAYED_RELEASE_TABLET | Freq: Every evening | ORAL | Status: DC
  Administered 2022-08-31: 40 mg via ORAL
  Filled 2022-08-31: qty 1

## 2022-08-31 MED ORDER — HYDROCODONE-ACETAMINOPHEN 5-325 MG PO TABS
2.0000 | ORAL_TABLET | ORAL | Status: DC | PRN
  Administered 2022-08-31 – 2022-09-01 (×6): 2 via ORAL
  Filled 2022-08-31 (×6): qty 2

## 2022-08-31 MED ORDER — FENTANYL CITRATE (PF) 250 MCG/5ML IJ SOLN
INTRAMUSCULAR | Status: AC
  Filled 2022-08-31: qty 5

## 2022-08-31 MED ORDER — ORAL CARE MOUTH RINSE: 15.0000 mL | Freq: Once | OROMUCOSAL | Status: AC

## 2022-08-31 MED ORDER — LIDOCAINE 2% (20 MG/ML) 5 ML SYRINGE
INTRAMUSCULAR | Status: AC
  Filled 2022-08-31: qty 5

## 2022-08-31 MED ORDER — ADULT MULTIVITAMIN W/MINERALS CH
1.0000 | ORAL_TABLET | Freq: Every day | ORAL | Status: DC
  Administered 2022-08-31: 1 via ORAL
  Filled 2022-08-31: qty 1

## 2022-08-31 MED ORDER — FERROUS SULFATE 325 (65 FE) MG PO TABS
325.0000 mg | ORAL_TABLET | Freq: Every day | ORAL | Status: DC
  Administered 2022-09-01: 325 mg via ORAL
  Filled 2022-08-31: qty 1

## 2022-08-31 MED ORDER — ACETAMINOPHEN 325 MG PO TABS: 650.0000 mg | ORAL_TABLET | Freq: Four times a day (QID) | ORAL | Status: DC | PRN

## 2022-08-31 MED ORDER — ACETAMINOPHEN 500 MG PO TABS: 1000.0000 mg | ORAL_TABLET | Freq: Once | ORAL | Status: DC | PRN

## 2022-08-31 MED ORDER — DILTIAZEM HCL ER COATED BEADS 180 MG PO CP24
180.0000 mg | ORAL_CAPSULE | Freq: Two times a day (BID) | ORAL | Status: DC
  Administered 2022-08-31: 180 mg via ORAL
  Filled 2022-08-31 (×2): qty 1

## 2022-08-31 MED ORDER — CEFAZOLIN SODIUM-DEXTROSE 2-4 GM/100ML-% IV SOLN
2.0000 g | Freq: Three times a day (TID) | INTRAVENOUS | Status: DC
  Administered 2022-08-31 – 2022-09-01 (×3): 2 g via INTRAVENOUS
  Filled 2022-08-31 (×3): qty 100

## 2022-08-31 MED ORDER — PHENYLEPHRINE HCL-NACL 20-0.9 MG/250ML-% IV SOLN
INTRAVENOUS | Status: DC | PRN
  Administered 2022-08-31: 25 ug/min via INTRAVENOUS

## 2022-08-31 MED ORDER — METOPROLOL SUCCINATE ER 25 MG PO TB24: 25.0000 mg | ORAL_TABLET | Freq: Every day | ORAL | Status: DC

## 2022-08-31 MED ORDER — ACETAMINOPHEN 325 MG PO TABS: 650.0000 mg | ORAL_TABLET | ORAL | Status: DC | PRN

## 2022-08-31 MED ORDER — BUPIVACAINE LIPOSOME 1.3 % IJ SUSP
INTRAMUSCULAR | Status: AC
  Filled 2022-08-31: qty 20

## 2022-08-31 MED ORDER — ONDANSETRON HCL 4 MG/2ML IJ SOLN
INTRAMUSCULAR | Status: DC | PRN
  Administered 2022-08-31: 4 mg via INTRAVENOUS

## 2022-08-31 MED ORDER — IRBESARTAN 75 MG PO TABS
37.5000 mg | ORAL_TABLET | Freq: Every day | ORAL | Status: DC
  Administered 2022-08-31: 37.5 mg via ORAL
  Filled 2022-08-31 (×2): qty 0.5

## 2022-08-31 MED ORDER — TRAMADOL HCL 50 MG PO TABS: 50.0000 mg | ORAL_TABLET | Freq: Four times a day (QID) | ORAL | Status: DC | PRN

## 2022-08-31 MED ORDER — ACETAMINOPHEN 650 MG RE SUPP: 650.0000 mg | RECTAL | Status: DC | PRN

## 2022-08-31 MED ORDER — LIDOCAINE 2% (20 MG/ML) 5 ML SYRINGE
INTRAMUSCULAR | Status: DC | PRN
  Administered 2022-08-31: 60 mg via INTRAVENOUS

## 2022-08-31 MED ORDER — OLOPATADINE HCL 0.1 % OP SOLN
1.0000 [drp] | Freq: Two times a day (BID) | OPHTHALMIC | Status: DC
  Administered 2022-08-31: 1 [drp] via OPHTHALMIC
  Filled 2022-08-31: qty 5

## 2022-08-31 MED ORDER — MENTHOL 3 MG MT LOZG: 1.0000 | LOZENGE | OROMUCOSAL | Status: DC | PRN

## 2022-08-31 MED ORDER — EPHEDRINE SULFATE-NACL 50-0.9 MG/10ML-% IV SOSY
PREFILLED_SYRINGE | INTRAVENOUS | Status: DC | PRN
  Administered 2022-08-31: 10 mg via INTRAVENOUS
  Administered 2022-08-31: 5 mg via INTRAVENOUS
  Administered 2022-08-31: 10 mg via INTRAVENOUS

## 2022-08-31 MED ORDER — ROCURONIUM BROMIDE 10 MG/ML (PF) SYRINGE
PREFILLED_SYRINGE | INTRAVENOUS | Status: DC | PRN
  Administered 2022-08-31: 80 mg via INTRAVENOUS
  Administered 2022-08-31: 30 mg via INTRAVENOUS
  Administered 2022-08-31 (×2): 20 mg via INTRAVENOUS

## 2022-08-31 MED ORDER — SUGAMMADEX SODIUM 200 MG/2ML IV SOLN
INTRAVENOUS | Status: DC | PRN
  Administered 2022-08-31: 100 mg via INTRAVENOUS
  Administered 2022-08-31: 200 mg via INTRAVENOUS

## 2022-08-31 MED ORDER — 0.9 % SODIUM CHLORIDE (POUR BTL) OPTIME
TOPICAL | Status: DC | PRN
  Administered 2022-08-31: 1000 mL
  Administered 2022-08-31 (×2): 500 mL

## 2022-08-31 MED ORDER — MUPIROCIN 2 % EX OINT
1.0000 | TOPICAL_OINTMENT | Freq: Two times a day (BID) | CUTANEOUS | Status: DC
  Administered 2022-08-31: 1 via TOPICAL
  Filled 2022-08-31: qty 22

## 2022-08-31 MED ORDER — TAMSULOSIN HCL 0.4 MG PO CAPS: 0.4000 mg | ORAL_CAPSULE | Freq: Every day | ORAL | Status: DC

## 2022-08-31 MED ORDER — TADALAFIL 5 MG PO TABS: 5.0000 mg | ORAL_TABLET | Freq: Every day | ORAL | Status: DC | PRN

## 2022-08-31 MED ORDER — ACETAMINOPHEN 10 MG/ML IV SOLN: 1000.0000 mg | Freq: Once | INTRAVENOUS | Status: DC | PRN

## 2022-08-31 MED ORDER — OXYCODONE HCL 5 MG/5ML PO SOLN: 5.0000 mg | Freq: Once | ORAL | Status: DC | PRN

## 2022-08-31 MED ORDER — MONTELUKAST SODIUM 10 MG PO TABS
10.0000 mg | ORAL_TABLET | Freq: Every day | ORAL | Status: DC
  Administered 2022-08-31: 10 mg via ORAL
  Filled 2022-08-31: qty 1

## 2022-08-31 MED ORDER — BUPIVACAINE LIPOSOME 1.3 % IJ SUSP
INTRAMUSCULAR | Status: DC | PRN
  Administered 2022-08-31: 20 mL

## 2022-08-31 MED ORDER — HYDROMORPHONE HCL 1 MG/ML IJ SOLN
0.5000 mg | INTRAMUSCULAR | Status: DC | PRN
  Administered 2022-08-31: 0.5 mg via INTRAVENOUS
  Filled 2022-08-31: qty 0.5

## 2022-08-31 MED ORDER — CEFAZOLIN SODIUM-DEXTROSE 2-4 GM/100ML-% IV SOLN
2.0000 g | INTRAVENOUS | Status: AC
  Administered 2022-08-31: 2 g via INTRAVENOUS
  Filled 2022-08-31: qty 100

## 2022-08-31 MED ORDER — SODIUM CHLORIDE 0.9% FLUSH
3.0000 mL | Freq: Two times a day (BID) | INTRAVENOUS | Status: DC
  Administered 2022-08-31: 3 mL via INTRAVENOUS

## 2022-08-31 MED ORDER — ROCURONIUM BROMIDE 10 MG/ML (PF) SYRINGE
PREFILLED_SYRINGE | INTRAVENOUS | Status: AC
  Filled 2022-08-31: qty 10

## 2022-08-31 MED ORDER — SODIUM CHLORIDE 0.9 % IV SOLN: 250.0000 mL | INTRAVENOUS | Status: DC

## 2022-08-31 MED ORDER — DEXAMETHASONE SODIUM PHOSPHATE 10 MG/ML IJ SOLN
INTRAMUSCULAR | Status: AC
  Filled 2022-08-31: qty 1

## 2022-08-31 MED ORDER — CHLORPROMAZINE HCL 25 MG PO TABS
25.0000 mg | ORAL_TABLET | Freq: Four times a day (QID) | ORAL | Status: DC | PRN
  Administered 2022-09-01: 25 mg via ORAL
  Filled 2022-08-31 (×2): qty 1

## 2022-08-31 MED ORDER — CYCLOBENZAPRINE HCL 10 MG PO TABS
10.0000 mg | ORAL_TABLET | Freq: Three times a day (TID) | ORAL | Status: DC | PRN
  Administered 2022-08-31 – 2022-09-01 (×2): 10 mg via ORAL
  Filled 2022-08-31 (×2): qty 1

## 2022-08-31 MED ORDER — LORATADINE 10 MG PO TABS: 10.0000 mg | ORAL_TABLET | Freq: Every day | ORAL | Status: DC

## 2022-08-31 MED ORDER — OXYCODONE HCL 5 MG PO TABS: 5.0000 mg | ORAL_TABLET | Freq: Once | ORAL | Status: DC | PRN

## 2022-08-31 MED ORDER — POTASSIUM CHLORIDE CRYS ER 20 MEQ PO TBCR: 20.0000 meq | EXTENDED_RELEASE_TABLET | Freq: Every morning | ORAL | Status: DC

## 2022-08-31 MED ORDER — ONDANSETRON HCL 4 MG PO TABS: 4.0000 mg | ORAL_TABLET | Freq: Four times a day (QID) | ORAL | Status: DC | PRN

## 2022-08-31 MED ORDER — HYDROCHLOROTHIAZIDE 12.5 MG PO TABS
12.5000 mg | ORAL_TABLET | Freq: Every day | ORAL | Status: DC
  Administered 2022-08-31: 12.5 mg via ORAL
  Filled 2022-08-31: qty 1

## 2022-08-31 MED ORDER — PROPOFOL 10 MG/ML IV BOLUS
INTRAVENOUS | Status: AC
  Filled 2022-08-31: qty 20

## 2022-08-31 MED ORDER — FLUTICASONE PROPIONATE 50 MCG/ACT NA SUSP: 2.0000 | Freq: Every day | NASAL | Status: DC | PRN

## 2022-08-31 MED ORDER — FENTANYL CITRATE (PF) 100 MCG/2ML IJ SOLN
INTRAMUSCULAR | Status: AC
  Filled 2022-08-31: qty 2

## 2022-08-31 MED ORDER — LIDOCAINE-EPINEPHRINE 1 %-1:100000 IJ SOLN
INTRAMUSCULAR | Status: DC | PRN
  Administered 2022-08-31: 10 mL

## 2022-08-31 MED ORDER — CELECOXIB 200 MG PO CAPS: 200.0000 mg | ORAL_CAPSULE | Freq: Every day | ORAL | Status: DC

## 2022-08-31 SURGICAL SUPPLY — 82 items
BAG COUNTER SPONGE SURGICOUNT (BAG) ×4 IMPLANT
BAND RUBBER #18 3X1/16 STRL (MISCELLANEOUS) ×4 IMPLANT
BASKET BONE COLLECTION (BASKET) ×2 IMPLANT
BENZOIN TINCTURE PRP APPL 2/3 (GAUZE/BANDAGES/DRESSINGS) ×4 IMPLANT
BLADE BONE MILL MEDIUM (MISCELLANEOUS) ×2 IMPLANT
BLADE CLIPPER SURG (BLADE) IMPLANT
BLADE SURG 11 STRL SS (BLADE) ×4 IMPLANT
BONE CANC CHIPS 20CC PCAN1/4 (Bone Implant) ×2 IMPLANT
BONE VIVIGEN FORMABLE 5.4CC (Bone Implant) ×2 IMPLANT
BUR CUTTER 7.0 ROUND (BURR) ×4 IMPLANT
BUR MATCHSTICK NEURO 3.0 LAGG (BURR) ×4 IMPLANT
CANISTER SUCT 3000ML PPV (MISCELLANEOUS) ×4 IMPLANT
CAP LOCKING (Cap) ×20 IMPLANT
CAP LOCKING 5.5 CREO (Cap) IMPLANT
CHIPS CANC BONE 20CC PCAN1/4 (Bone Implant) ×2 IMPLANT
CNTNR URN SCR LID CUP LEK RST (MISCELLANEOUS) ×2 IMPLANT
CONT SPEC 4OZ STRL OR WHT (MISCELLANEOUS) ×2
COVER BACK TABLE 60X90IN (DRAPES) ×2 IMPLANT
DERMABOND ADVANCED .7 DNX12 (GAUZE/BANDAGES/DRESSINGS) ×4 IMPLANT
DRAPE C-ARM 42X72 X-RAY (DRAPES) ×4 IMPLANT
DRAPE C-ARMOR (DRAPES) IMPLANT
DRAPE HALF SHEET 40X57 (DRAPES) IMPLANT
DRAPE LAPAROTOMY 100X72X124 (DRAPES) ×4 IMPLANT
DRAPE MICROSCOPE SLANT 54X150 (MISCELLANEOUS) ×2 IMPLANT
DRAPE SURG 17X23 STRL (DRAPES) ×4 IMPLANT
DRSG OPSITE 4X5.5 SM (GAUZE/BANDAGES/DRESSINGS) ×2 IMPLANT
DRSG OPSITE POSTOP 4X10 (GAUZE/BANDAGES/DRESSINGS) IMPLANT
DRSG OPSITE POSTOP 4X6 (GAUZE/BANDAGES/DRESSINGS) ×2 IMPLANT
DURAPREP 26ML APPLICATOR (WOUND CARE) ×4 IMPLANT
ELECT BLADE 4.0 EZ CLEAN MEGAD (MISCELLANEOUS) ×2
ELECT REM PT RETURN 9FT ADLT (ELECTROSURGICAL) ×2
ELECTRODE BLDE 4.0 EZ CLN MEGD (MISCELLANEOUS) IMPLANT
ELECTRODE REM PT RTRN 9FT ADLT (ELECTROSURGICAL) ×4 IMPLANT
EVACUATOR 1/8 PVC DRAIN (DRAIN) IMPLANT
EVACUATOR 3/16  PVC DRAIN (DRAIN)
EVACUATOR 3/16 PVC DRAIN (DRAIN) IMPLANT
GAUZE 4X4 16PLY ~~LOC~~+RFID DBL (SPONGE) IMPLANT
GAUZE SPONGE 4X4 12PLY STRL (GAUZE/BANDAGES/DRESSINGS) ×4 IMPLANT
GLOVE BIO SURGEON STRL SZ7 (GLOVE) IMPLANT
GLOVE BIO SURGEON STRL SZ8 (GLOVE) ×6 IMPLANT
GLOVE BIOGEL PI IND STRL 7.0 (GLOVE) IMPLANT
GLOVE EXAM NITRILE XL STR (GLOVE) IMPLANT
GLOVE INDICATOR 8.5 STRL (GLOVE) ×12 IMPLANT
GOWN STRL REUS W/ TWL LRG LVL3 (GOWN DISPOSABLE) ×2 IMPLANT
GOWN STRL REUS W/ TWL XL LVL3 (GOWN DISPOSABLE) ×8 IMPLANT
GOWN STRL REUS W/TWL 2XL LVL3 (GOWN DISPOSABLE) IMPLANT
GOWN STRL REUS W/TWL LRG LVL3 (GOWN DISPOSABLE) ×4
GOWN STRL REUS W/TWL XL LVL3 (GOWN DISPOSABLE) ×8
GRAFT BNE CANC CHIPS 1-8 20CC (Bone Implant) IMPLANT
GRAFT BNE MATRIX VG FRMBL MD 5 (Bone Implant) IMPLANT
IMPL SPINE MCS 6.5 5.5X35 (Neuro Prosthesis/Implant) IMPLANT
IMPLANT SPINE MCS 6.5 5.5X35 (Neuro Prosthesis/Implant) ×2 IMPLANT
KIT BASIN OR (CUSTOM PROCEDURE TRAY) ×4 IMPLANT
KIT GRAFTMAG DEL NEURO DISP (NEUROSURGERY SUPPLIES) IMPLANT
KIT INFUSE SMALL (Orthopedic Implant) IMPLANT
KIT TURNOVER KIT B (KITS) ×4 IMPLANT
MILL BONE PREP (MISCELLANEOUS) ×2 IMPLANT
NDL HYPO 25X1 1.5 SAFETY (NEEDLE) ×2 IMPLANT
NDL SPNL 22GX3.5 QUINCKE BK (NEEDLE) ×2 IMPLANT
NEEDLE HYPO 22GX1.5 SAFETY (NEEDLE) ×2 IMPLANT
NEEDLE HYPO 25X1 1.5 SAFETY (NEEDLE) ×2 IMPLANT
NEEDLE SPNL 22GX3.5 QUINCKE BK (NEEDLE) IMPLANT
NS IRRIG 1000ML POUR BTL (IV SOLUTION) ×4 IMPLANT
NS IRRIG 500ML POUR BTL (IV SOLUTION) IMPLANT
PACK LAMINECTOMY NEURO (CUSTOM PROCEDURE TRAY) ×4 IMPLANT
PAD ARMBOARD 7.5X6 YLW CONV (MISCELLANEOUS) ×6 IMPLANT
SCREW 7.5/6.5X40MM (Screw) IMPLANT
SPIKE FLUID TRANSFER (MISCELLANEOUS) ×4 IMPLANT
SPONGE SURGIFOAM ABS GEL 100 (HEMOSTASIS) ×2 IMPLANT
SPONGE SURGIFOAM ABS GEL SZ50 (HEMOSTASIS) ×2 IMPLANT
SPONGE T-LAP 4X18 ~~LOC~~+RFID (SPONGE) IMPLANT
STRIP CLOSURE SKIN 1/2X4 (GAUZE/BANDAGES/DRESSINGS) ×6 IMPLANT
SUT VIC AB 0 CT1 18XCR BRD8 (SUTURE) ×6 IMPLANT
SUT VIC AB 0 CT1 8-18 (SUTURE) ×2
SUT VIC AB 2-0 CT1 18 (SUTURE) ×4 IMPLANT
SUT VIC AB 4-0 PS2 27 (SUTURE) ×2 IMPLANT
SUT VICRYL 4-0 PS2 18IN ABS (SUTURE) ×2 IMPLANT
TOWEL GREEN STERILE (TOWEL DISPOSABLE) ×4 IMPLANT
TOWEL GREEN STERILE FF (TOWEL DISPOSABLE) ×4 IMPLANT
TRAY FOLEY MTR SLVR 16FR STAT (SET/KITS/TRAYS/PACK) ×2 IMPLANT
TULIP CREP AMP 5.5MM (Orthopedic Implant) IMPLANT
WATER STERILE IRR 1000ML POUR (IV SOLUTION) ×4 IMPLANT

## 2022-08-31 NOTE — H&P (Signed)
[name] is an 66 y.o. male.   Chief Complaint: Back and right leg pain HPI: 66 year old gentleman with back and right leg pain that is been refractory to all forms conservative treatment.  Patient is undergone previous L2-S1 fusion with the L5-S1 fusion being an anterior lumbar interbody fusion with posterior screw augmentation.  Patient has had progressive worsening right L5 radicular symptoms possibly also a component of L4 work-up is revealed pseudoarthrosis at L5-S1 loosening of bilateral S1 screws.  Due to patient's progression of clinical syndrome imaging findings and failure of conservative treatment I recommended reexploration fusion removal of hardware decompressive laminectomy and foraminotomies of L5 and L4.  I extensively gone over the risks and benefits of that operation with him as well as perioperative course expectations of outcome and alternatives to surgery and he understood and agreed to proceed forward.  Past Medical History:  Diagnosis Date   Acquired deafness, right    per pt completed deafness right ear since age 39   Allergic rhinitis, seasonal    Anticoagulant long-term use    eliquis--- managed by cardiology   BPH associated with nocturia    urologist--- dr Tresa Moore   [facility])    basal and squamous cell   Chronic low back pain    DDD (degenerative disc disease), lumbosacral    Foraminal stenosis of lumbosacral region    GERD (gastroesophageal reflux disease)    Heart murmur    History of kidney stones    History of skin cancer    Hydronephrosis, left    Hyperlipidemia, mixed    Hypertension, essential    followed by pcp and cardiology   Hypokalemia    OA (osteoarthritis)    Paroxysmal atrial fibrillation ([place]) 01/28/2017   cardiologist--- dr Nehemiah Massed;   pt had ETT 03-16-2017 per cardio note normal, event monitor 02-09-2017 showed baseline SR some peroids  Afib with controlled rate with occ PAC/ PVC;  per pt had echo many yrs ago prior to AFib     Past Surgical History:  Procedure Laterality Date   ABDOMINAL EXPOSURE N/A 01/18/2021   Procedure: ABDOMINAL EXPOSURE;  Surgeon: [name], MD;  Location: [place];  Service: Vascular;  Laterality: N/A;   ANTERIOR LUMBAR FUSION N/A 01/18/2021   Procedure: Anterior Lumbar Interbody Fusion - Lumbar Five-Sarcal One;  Surgeon: Kary Kos, MD;  Location: [place];  Service: Neurosurgery;  Laterality: N/A;  Anterior Lumbar Interbody Fusion - Lumbar Five-Sacral One   CYSTOSCOPY W/ URETERAL STENT PLACEMENT Left 06/18/2021   Procedure: CYSTOSCOPY WITH RETROGRADE PYELOGRAM/URETERAL STENT EXCHANGE;  Surgeon: Alexis Frock, MD;  Location: [facility];  Service: Urology;  Laterality: Left;   CYSTOSCOPY WITH RETROGRADE PYELOGRAM, URETEROSCOPY AND STENT PLACEMENT Left 04/21/2021   Procedure: CYSTOSCOPY WITH RETROGRADE PYELOGRAM, DIAGNOSTICURETEROSCOPY AND STENT PLACEMENT;  Surgeon: Alexis Frock, MD;  Location[facility]  Service: Urology;  Laterality: Left;  1 HR   EXTRACORPOREAL SHOCK WAVE LITHOTRIPSY     approx 2002   LUMBAR [place] SURGERY  2018   REPAIR DURAL / CSF LEAK  2018   post op lumbar diskectomy   REVISION TOTAL KNEE ARTHROPLASTY Left 2015   TOTAL KNEE ARTHROPLASTY Left 2012   TRANSFORAMINAL LUMBAR INTERBODY FUSION (TLIF) WITH PEDICLE SCREW FIXATION 1 LEVEL N/A 06/12/2019   Procedure: LUMBAR FOUR-FIVE TRANSFORAMINAL LUMBAR INTERBODY FUSION (TLIF) WITH PEDICLE SCREW FIXATION;  Surgeon: Kary Kos, MD;  Location: [place];  Service: Neurosurgery;  Laterality: N/A;   [place]  Family History  Problem Relation Age of Onset   Cancer Mother    Cancer Father    Cancer Brother    Hyperlipidemia Brother    Social History:  reports that he has never smoked. He quit smokeless tobacco use about 2 years ago.  His smokeless tobacco use included chew. He reports current alcohol use. He reports that he does not use drugs.  Allergies:  Allergies  Allergen  Reactions   Meloxicam Palpitations   Sulfa Antibiotics Rash    Medications Prior to Admission  Medication Sig Dispense Refill   acetaminophen (TYLENOL) 325 MG tablet Take 650 mg by mouth every 6 (six) hours as needed for moderate pain.     apixaban (ELIQUIS) 5 MG TABS tablet Take 5 mg by mouth 2 (two) times daily.     celecoxib (CELEBREX) 200 MG capsule Take 200 mg by mouth daily.     diltiazem (CARDIZEM CD) 180 MG 24 hr capsule Take 1 capsule (180 mg total) by mouth 2 (two) times daily. 180 capsule 1   ferrous sulfate 325 (65 FE) MG tablet Take 325 mg by mouth daily with breakfast.     fexofenadine (ALLEGRA) 180 MG tablet Take 1 tablet (180 mg total) by mouth daily. 90 tablet 1   fluticasone (FLONASE) 50 MCG/ACT nasal spray Place 2 sprays into both nostrils daily. (Patient taking differently: Place 2 sprays into both nostrils daily as needed for allergies.) 16 mL 0   hydrochlorothiazide (HYDRODIURIL) 12.5 MG tablet Take 1 tablet (12.5 mg total) by mouth daily. 90 tablet 1   metoprolol succinate (TOPROL-XL) 25 MG 24 hr tablet Take 1 tablet (25 mg total) by mouth daily. 90 tablet 1   montelukast (SINGULAIR) 10 MG tablet Take 1 tablet (10 mg total) by mouth at bedtime. 90 tablet 0   Multiple Vitamins-Minerals (MULTIVITAMIN MEN) TABS Take 1 tablet by mouth daily.     mupirocin ointment (BACTROBAN) 2 % Apply 1 Application topically 2 (two) times daily. 22 g 0   Olopatadine HCl 0.2 % SOLN Place 1 drop into both eyes 2 (two) times daily as needed (allergies).     pantoprazole (PROTONIX) 40 MG tablet Take 1 tablet (40 mg total) by mouth every evening. 90 tablet 1   potassium chloride SA (KLOR-CON M) 20 MEQ tablet Take 1-2 tablets (20-40 mEq total) by mouth See admin instructions. Take 20 mEq by mouth in the morning and 40 mEq in the evening 270 tablet 1   rosuvastatin (CRESTOR) 5 MG tablet TAKE 1 TABLET BY MOUTH THREE TIMES A WEEK 36 tablet 1   tadalafil (CIALIS) 5 MG tablet Take 1-2 tablets (5-10 mg  total) by mouth daily as needed for erectile dysfunction. 90 tablet 3   tamsulosin (FLOMAX) 0.4 MG CAPS capsule Take 1 capsule (0.4 mg total) by mouth daily. 90 capsule 3   traMADol (ULTRAM) 50 MG tablet Take 1 tablet by mouth every 6 (six) hours as needed for moderate pain.     valsartan (DIOVAN) 320 MG tablet Take 1 tablet (320 mg total) by mouth daily. 90 tablet 1    No results found for this or any previous visit (from the past 48 hour(s)). No results found.  Review of Systems  Constitutional: Negative.   HENT: Negative.    Eyes: Negative.   Respiratory: Negative.    Cardiovascular: Negative.   Gastrointestinal: Negative.   Musculoskeletal:  Positive for arthralgias, back pain and myalgias.    Blood pressure (!) 164/98, pulse 65, temperature 98.5  F (36.9 C), temperature source Oral, resp. rate 18, height '6\' 1"'$  (1.854 m), weight 111.1 kg, SpO2 100 %. Physical Exam HENT:     Head: Normocephalic.     Nose: Nose normal.     Mouth/Throat:     Mouth: Mucous membranes are moist.  Eyes:     Pupils: Pupils are equal, round, and reactive to light.  Cardiovascular:     Rate and Rhythm: Normal rate.  Pulmonary:     Effort: Pulmonary effort is normal.  Abdominal:     General: Abdomen is flat.  Musculoskeletal:        General: Normal range of motion.  Skin:    General: Skin is warm.  Neurological:     General: No focal deficit present.     Mental Status: He is alert.     Comments: Strength is 5 out of 5 iliopsoas, quads, hamstrings, gastroc, into tibialis, and EHL.      Assessment/Plan 66 year old presents for revision of fusion L5-S1 with redo foraminotomies of the L5 L4 nerve roots.  Elaina Hoops, MD 08/31/2022, 8:16 AM

## 2022-08-31 NOTE — Transfer of Care (Signed)
Immediate Anesthesia Transfer of Care Note  Patient: [name]  Procedure(s) Performed: Lumbar fusion - Lumbar five-Sacral one revision for pseudoarthrosis (Back) Redo foraminotomy right Lumbar four-five (Right: Back)  Patient Location: PACU  Anesthesia Type:General  Level of Consciousness: awake, alert , and oriented  Airway & Oxygen Therapy: Patient Spontanous Breathing and Patient connected to nasal cannula oxygen  Post-op Assessment: Report given to RN and Patient moving all extremities X 4  Post vital signs: Reviewed and stable  Last Vitals:  Vitals Value Taken Time  BP 136/73 08/31/22 1235  Temp 36.6 C 08/31/22 1235  Pulse 65 08/31/22 1238  Resp 12 08/31/22 1238  SpO2 94 % 08/31/22 1238  Vitals shown include unvalidated device data.  Last Pain:  Vitals:   08/31/22 0738  TempSrc:   PainSc: 4       Patients Stated Pain Goal: 0 (25/00/37 0488)  Complications: No notable events documented.

## 2022-08-31 NOTE — Progress Notes (Signed)
Orthopedic Tech Progress Note Patient Details:  [name] 06-19-1956 [id number] LSO Brace has delivered to patient's room. Patient was also shown how the brace should be applied.  Ortho Devices Type of Ortho Device: Lumbar corsett Ortho Device/Splint Location: Back Ortho Device/Splint Interventions: Ordered   Post Interventions Instructions Provided: Adjustment of device  [name] E Oda [place]downe 08/31/2022, 2:24 PM

## 2022-08-31 NOTE — Anesthesia Procedure Notes (Signed)
Procedure Name: Intubation Date/Time: 08/31/2022 8:36 AM  Performed by: Barrington Ellison, CRNAPre-anesthesia Checklist: Patient identified, Emergency Drugs available, Suction available and Patient being monitored Patient Re-evaluated:Patient Re-evaluated prior to induction Oxygen Delivery Method: Circle System Utilized Preoxygenation: Pre-oxygenation with 100% oxygen Induction Type: IV induction Ventilation: Mask ventilation without difficulty Laryngoscope Size: Mac and 4 Grade View: Grade I Tube type: Oral Tube size: 7.5 mm Number of attempts: 1 Airway Equipment and Method: Stylet and Oral airway Placement Confirmation: ETT inserted through vocal cords under direct vision, positive ETCO2 and breath sounds checked- equal and bilateral Secured at: 24 cm Tube secured with: Tape Dental Injury: Teeth and Oropharynx as per pre-operative assessment

## 2022-08-31 NOTE — Op Note (Signed)
Preoperative diagnosis: Pseudoarthrosis L5-S1 right-sided L4-L5 radiculopathy with lumbar spinal stenosis and foraminal stenosis L4 and L5 on the right  Postoperative diagnosis: Same.  Procedure: #1 decompressive laminectomy L5-S1 with complete medial facetectomy and redo foraminotomies of the L5 nerve root with foraminotomies of the S1 nerve root.  2.  Foraminotomies of the L4 nerve root at L4-5.  3.  Exploration fusion removal of hardware for pseudoarthrosis L5-S1 with removal of bilateral loose S1 screws and removal of right L5 screw.  4.  Cortical screw fixation L2-S1 with replacement of bilateral S1 screws and replacement of right L5 screw utilizing globus Creo amp modular cortical screw set utilizing 7.5 x 35 mm screws at S1 and 6.5 x 35 cortical screw at L5 on the right  5.  Posterior lateral arthrodesis L4-5 L5-S1 bilaterally utilizing BMP Vivigen locally harvested autograft and cancellous chips.  Surgeon: Kary Kos.  Assistant: Ashok Pall.  Assistant to: Nash Shearer.  Anesthesia: General.  EBL: Minimal.  HPI: 66 year old gentleman previously undergone L2-S1 fusion over successive operations and and had progressive worsening back pain with right L4 and L5 radiculopathy.  Work-up revealed pseudoarthrosis at L5-S1 with loosening of bilateral S1 screws and severe foraminal stenosis of the L4 and L5 nerve root on the right.  Due to patient's progression of clinical syndrome imaging findings of a conservative treatment I recommended redo decompressive laminectomy facetectomy and foraminotomies of the L4 and L5 nerve roots on the right and redo posterior lateral instrumented fusion at L5-S1.  I extensively went over the risks and benefits of the operation with the patient as well as perioperative course expectations of outcome and alternatives of surgery and he understood and agreed to proceed forward.  Operative procedure: Patient was brought into the OR was induced under general  anesthesia positioned prone the Wilson frame his back was prepped and draped in routine sterile fashion his old incision was opened up and extended slightly caudal the scar tissue and subperiosteal dissection was carried out exposing the hardware from L2-S1 exposing the high lamina complex at L5 and S1.  I also exposed S2 and the sacral ala.  I then disconnected the knots rod and remove the rods and removed loose S1 screws and I remove the L5 screw in order to perform the foraminotomies.  I then identified the spinous process at L5 remove that and worked and performed a right-sided laminectomy with complete I then identified the L5 pedicle dissected the scar tissue off of the L5 pedicle identified the L5 nerve root and removing the pars and completely removing the facet joint I was able to decompress the distal aspect L5 nerve root.  I also performed a foraminotomies the S1 nerve root removing the ligament flavum so I could identify the lateral aspect of thecal sac and disc base better.  Then marching superiorly freeing up the scar tissue freed up the L5 nerve from the L5 pedicle all the way superiorly up to the level of the 4 5 disc base.  I then identified the L4 pedicle dissected the scar tissue off the L4 pedicle and identified the L4 nerve root and performed a redo foraminotomies of the L4 nerve root unroofing extensive my scar and osteophyte.  There was a large hyperostotic bone growth coming off the lateral aspect the disc base underneath the facet joint and I freed this removing it from the IV nerve root as well as removing it from the proximal medial V nerve root.  At the end the foraminotomies there  was no further stenosis a coronary dilator easily passed out the foramen.  Then this was packed with Gelfoam and then under fluoroscopy I replaced both S1 screws with screws that were 2 sizes larger and 5 mm longer as well as 1 size larger on the L5 screw.  All screws appear to have excellent purchase.  Then  the wound was copiously irrigated meticulous hemostasis was maintained I aggressively decorticated the TPs lateral facet joints 6S2 and sacral ala and then posterior laterally I packed BMP Vivigen autograft cancellous chips.  Then assembled the heads advanced the screws fluoroscopy confirmed good position of all the implants utilized the pre-existing rods and anchored everything in place.  Then reinspected the foramina to confirm patency and no migration of graft material placed a medium Hemovac drain and closed the wound in layers with interrupted Vicryl and a running 4 subcuticular.  I injected Exparel in the fascia.  Wound was closed and dressed patient recovery in stable condition at the end the case all needle count sponge counts were correct.

## 2022-09-01 MED ORDER — CYCLOBENZAPRINE HCL 10 MG PO TABS: 10.0000 mg | ORAL_TABLET | Freq: Three times a day (TID) | ORAL | 0 refills | Status: DC | PRN

## 2022-09-01 MED ORDER — HYDROCODONE-ACETAMINOPHEN 5-325 MG PO TABS: 2.0000 | ORAL_TABLET | ORAL | 0 refills | Status: DC | PRN

## 2022-09-01 MED ORDER — APIXABAN 5 MG PO TABS: 5.0000 mg | ORAL_TABLET | Freq: Two times a day (BID) | ORAL | 0 refills | Status: AC

## 2022-09-01 NOTE — Plan of Care (Signed)
Pt doing well. Pt and wife given D/C instructions with verbal understanding. Rx's were sent to the pharmacy by MD. Pt's incision is clean and dry with no sign of infection. Pt's IV was removed prior to D/C. Pt D/C'd home via wheelchair per MD order. Pt is stable @ D/C and has no other needs at this time. Djeneba Barsch, RN  

## 2022-09-01 NOTE — Anesthesia Postprocedure Evaluation (Signed)
Anesthesia Post Note  Patient: LEANDRE WIEN  Procedure(s) Performed: Lumbar fusion - Lumbar five-Sacral one revision for pseudoarthrosis (Back) Redo foraminotomy right Lumbar four-five (Right: Back)     Patient location during evaluation: PACU Anesthesia Type: General Level of consciousness: awake and alert Pain management: pain level controlled Vital Signs Assessment: post-procedure vital signs reviewed and stable Respiratory status: spontaneous breathing, nonlabored ventilation and respiratory function stable Cardiovascular status: blood pressure returned to baseline and stable Postop Assessment: no apparent nausea or vomiting Anesthetic complications: no   No notable events documented.  Last Vitals:  Vitals:   09/01/22 0335 09/01/22 0813  BP: (!) 146/80 (!) 142/78  Pulse: 81 65  Resp: 18 17  Temp: 36.8 C 36.7 C  SpO2: 95% 95%    Last Pain:  Vitals:   09/01/22 0813  TempSrc: Oral  PainSc:                  Sophie Tamez

## 2022-09-01 NOTE — Evaluation (Signed)
Occupational Therapy Evaluation and Discharge Patient Details Name: [name] MRN: [id number] DOB: 1956/03/31 Today's Date: 09/01/2022   History of Present Illness 66 y/o male s/p PLIF L4-S1 on 08/31/22. PMH: HTN, hx of back surgery, Afib, heart murmur, hx of skin cancer.   Clinical Impression   This 66 yo male admitted and underwent above presents to acute OT with all education completed, we will D/C from acute OT.      Recommendations for follow up therapy are one component of a multi-disciplinary discharge planning process, led by the attending physician.  Recommendations may be updated based on patient status, additional functional criteria and insurance authorization.   Follow Up Recommendations  No OT follow up    Assistance Recommended at Discharge PRN  Patient can return home with the following Assistance with cooking/housework;Assist for transportation    Functional Status Assessment  Patient has had a recent decline in their functional status and demonstrates the ability to make significant improvements in function in a reasonable and predictable amount of time. (without further need for skilled OT)  Equipment Recommendations  Other (comment)       Precautions / Restrictions Precautions Precautions: Back Precaution Booklet Issued: Yes (comment) Required Braces or Orthoses: Spinal Brace Spinal Brace: Lumbar corset;Applied in standing position Restrictions Weight Bearing Restrictions: No      Mobility Bed Mobility Overal bed mobility: Modified Independent                  Transfers  Mod I                        Balance Overall balance assessment: Mild deficits observed, not formally tested                                         ADL either performed or assessed with clinical judgement   ADL Overall ADL's : Modified independent                                       General ADL Comments: Educated on  sequencing of dressing, use of 2 cups for brushing teeth, use of wet wipes for back peri care, use of pillow between knees if laying on side and use adjustable bed at knees if laying on back, not sitting for more than 20-30 minutes at a time building up to one hour, gradually build up walking time or distance     Vision Patient Visual Report: No change from baseline              Pertinent Vitals/Pain Pain Assessment Pain Assessment: 0-10 Faces Pain Scale: Hurts little more Pain Location: back - incision site Pain Descriptors / Indicators: Grimacing, Operative site guarding Pain Intervention(s): Monitored during session, Repositioned, Limited activity within patient's tolerance     Hand Dominance Right   Extremity/Trunk Assessment Upper Extremity Assessment Upper Extremity Assessment: Overall WFL for tasks assessed     Communication Communication Communication: No difficulties   Cognition Arousal/Alertness: Awake/alert Behavior During Therapy: WFL for tasks assessed/performed Overall Cognitive Status: Within Functional Limits for tasks assessed  Home Living Family/patient expects to be discharged to:: Private residence Living Arrangements: Spouse/significant other Available Help at Discharge: Family;Available 24 hours/day Type of Home: House Home Access: Stairs to enter [employer] of Steps: 2 Entrance Stairs-Rails: Left Home Layout: Two level;Bed/bath upstairs Alternate Level Stairs-Number of Steps: flight Alternate Level Stairs-Rails: Left Bathroom Shower/Tub: Occupational psychologist: Handicapped height     Home Equipment: None          Prior Functioning/Environment Prior Level of Function : Independent/Modified Independent                        OT Problem List: Decreased range of motion;Impaired balance (sitting and/or standing);Pain         OT  Goals(Current goals can be found in the care plan section) Acute Rehab OT Goals Patient Stated Goal: to go home today         AM-PAC OT "6 Clicks" Daily Activity     Outcome Measure Help from another person eating meals?: None Help from another person taking care of personal grooming?: None Help from another person toileting, which includes using toliet, bedpan, or urinal?: None Help from another person bathing (including washing, rinsing, drying)?: None Help from another person to put on and taking off regular upper body clothing?: None Help from another person to put on and taking off regular lower body clothing?: None 6 Click Score: 24   End of Session Equipment Utilized During Treatment: Back brace Nurse Communication:  (no further OT needs)  Activity Tolerance: Patient tolerated treatment well Patient left: in chair;with call bell/phone within reach  OT Visit Diagnosis: Pain;Muscle weakness (generalized) (M62.81) Pain - part of body:  (incisional)                Time: 9735-3299 OT Time Calculation (min): 17 min Charges:  OT General Charges $OT Visit: 1 Visit OT Evaluation $OT Eval Moderate Complexity: 1 Mod [name], OTR/L [facility] [phone] Office [phone]    [name] 09/01/2022, 10:35 AM

## 2022-09-01 NOTE — Discharge Summary (Signed)
Physician Discharge Summary  Patient ID: [name] MRN: [id number] DOB/AGE: Apr 27, 1956 66 y.o. Estimated body mass index is 32.32 kg/m as calculated from the following:   Height as of this encounter: '6\' 1"'$  (1.854 m).   Weight as of this encounter: 111.1 kg.   Admit date: 08/31/2022 Discharge date: 09/01/2022  Admission Diagnoses: Pseudoarthrosis L5-S1 spinal stenosis L4-5 L5-S1  Discharge Diagnoses: Same Principal Problem:   Pseudoarthrosis of lumbar spine   Discharged Condition: good  Hospital Course: Patient was admitted to the hospital underwent reexploration fusion removal of hardware redo posterior lateral instrumented fusion L5-S1 and foraminotomies of the L5 and L4 nerve root on the right postoperative patient did very well recovered in the floor on the floor was ambulating and voiding spontaneously tolerating regular diet and stable for discharge home.  Patient be discharged with scheduled follow-up in 1 to 2 weeks.  Consults: Significant Diagnostic Studies: Treatments: Reexploration fusion removal of hardware redo instrumented fusion decompressive foraminotomies Discharge Exam: Blood pressure (!) 146/80, pulse 81, temperature 98.2 F (36.8 C), temperature source Oral, resp. rate 18, height '6\' 1"'$  (1.854 m), weight 111.1 kg, SpO2 95 %. Strength 5 out of 5 wound clean dry and intact  Disposition: Home   Allergies as of 09/01/2022       Reactions   Meloxicam Palpitations   Sulfa Antibiotics Rash        Medication List     TAKE these medications    acetaminophen 325 MG tablet Commonly known as: TYLENOL Take 650 mg by mouth every 6 (six) hours as needed for moderate pain.   apixaban 5 MG Tabs tablet Commonly known as: ELIQUIS Take 1 tablet (5 mg total) by mouth 2 (two) times daily. Restart Wednesday, November 16 What changed: additional instructions   celecoxib 200 MG capsule Commonly known as: CELEBREX Take 200 mg by mouth daily.    cyclobenzaprine 10 MG tablet Commonly known as: FLEXERIL Take 1 tablet (10 mg total) by mouth 3 (three) times daily as needed for muscle spasms.   diltiazem 180 MG 24 hr capsule Commonly known as: CARDIZEM CD Take 1 capsule (180 mg total) by mouth 2 (two) times daily.   ferrous sulfate 325 (65 FE) MG tablet Take 325 mg by mouth daily with breakfast.   fexofenadine 180 MG tablet Commonly known as: ALLEGRA Take 1 tablet (180 mg total) by mouth daily.   fluticasone 50 MCG/ACT nasal spray Commonly known as: FLONASE Place 2 sprays into both nostrils daily. What changed:  when to take this reasons to take this   hydrochlorothiazide 12.5 MG tablet Commonly known as: HYDRODIURIL Take 1 tablet (12.5 mg total) by mouth daily.   HYDROcodone-acetaminophen 5-325 MG tablet Commonly known as: NORCO/VICODIN Take 2 tablets by mouth every 4 (four) hours as needed for severe pain ((score 7 to 10)).   metoprolol succinate 25 MG 24 hr tablet Commonly known as: TOPROL-XL Take 1 tablet (25 mg total) by mouth daily.   montelukast 10 MG tablet Commonly known as: SINGULAIR Take 1 tablet (10 mg total) by mouth at bedtime.   Multivitamin Men Tabs Take 1 tablet by mouth daily.   mupirocin ointment 2 % Commonly known as: BACTROBAN Apply 1 Application topically 2 (two) times daily.   Olopatadine HCl 0.2 % Soln Place 1 drop into both eyes 2 (two) times daily as needed (allergies).   pantoprazole 40 MG tablet Commonly known as: PROTONIX Take 1 tablet (40 mg total) by mouth every evening.   potassium chloride  SA 20 MEQ tablet Commonly known as: KLOR-CON M Take 1-2 tablets (20-40 mEq total) by mouth See admin instructions. Take 20 mEq by mouth in the morning and 40 mEq in the evening   rosuvastatin 5 MG tablet Commonly known as: CRESTOR TAKE 1 TABLET BY MOUTH THREE TIMES A WEEK   tadalafil 5 MG tablet Commonly known as: CIALIS Take 1-2 tablets (5-10 mg total) by mouth daily as needed for  erectile dysfunction.   tamsulosin 0.4 MG Caps capsule Commonly known as: FLOMAX Take 1 capsule (0.4 mg total) by mouth daily.   traMADol 50 MG tablet Commonly known as: ULTRAM Take 1 tablet by mouth every 6 (six) hours as needed for moderate pain.   valsartan 320 MG tablet Commonly known as: DIOVAN Take 1 tablet (320 mg total) by mouth daily.         Signed: Elaina Hoops 09/01/2022, 7:34 AM

## 2022-09-01 NOTE — Evaluation (Signed)
Physical Therapy Evaluation Patient Details Name: [name] MRN: [id number] DOB: 08/29/1956 Today's Date: 09/01/2022  History of Present Illness  66 y/o male s/p PLIF L4-S1 on 08/31/22. PMH: HTN, hx of back surgery, Afib, heart murmur, hx of skin cancer.  Clinical Impression  Patient admitted following above procedure. Patient was independent and lives with wife who is able to assist at discharge. Patient functioning at modI level with no AD and able to negotiate flight of stairs to access home environment. Educated patient on back precautions, brace wear, progressive walking program, and car transfer, patient verbalized understanding. No further skilled PT needs identified acutely. No PT follow up recommended at this time.    Recommendations for follow up therapy are one component of a multi-disciplinary discharge planning process, led by the attending physician.  Recommendations may be updated based on patient status, additional functional criteria and insurance authorization.  Follow Up Recommendations No PT follow up      Assistance Recommended at Discharge PRN  Patient can return home with the following       Equipment Recommendations None recommended by PT  Recommendations for Other Services       Functional Status Assessment Patient has had a recent decline in their functional status and demonstrates the ability to make significant improvements in function in a reasonable and predictable amount of time.     Precautions / Restrictions Precautions Precautions: Back Precaution Booklet Issued: Yes (comment) Required Braces or Orthoses: Spinal Brace Spinal Brace: Lumbar corset;Applied in standing position Restrictions Weight Bearing Restrictions: No      Mobility  Bed Mobility Overal bed mobility: Modified Independent             General bed mobility comments: good recall of log roll technique from previous surgery    Transfers Overall transfer level:  Modified independent Equipment used: None                    Ambulation/Gait Ambulation/Gait assistance: Modified independent (Device/Increase time) Gait Distance (Feet): 250 Feet Assistive device: None Gait Pattern/deviations: Step-through pattern, Decreased stride length Gait velocity: decreased        Stairs Stairs: Yes Stairs assistance: Modified independent (Device/Increase time) Stair Management: One rail Right, Alternating pattern, Forwards Number of Stairs: 10    Wheelchair Mobility    Modified Rankin (Stroke Patients Only)       Balance Overall balance assessment: Mild deficits observed, not formally tested                                           Pertinent Vitals/Pain Pain Assessment Pain Assessment: Faces Faces Pain Scale: Hurts little more Pain Location: back - incision site Pain Descriptors / Indicators: Grimacing, Operative site guarding Pain Intervention(s): Monitored during session, Repositioned    Home Living Family/patient expects to be discharged to:: Private residence Living Arrangements: Spouse/significant other Available Help at Discharge: Family;Available 24 hours/day Type of Home: House Home Access: Stairs to enter   [employer] of Steps: 2 Alternate Level Stairs-Number of Steps: flight Home Layout: Two level;Bed/bath upstairs Home Equipment: None      Prior Function Prior Level of Function : Independent/Modified Independent                     Hand Dominance        Extremity/Trunk Assessment   Upper Extremity Assessment Upper Extremity  Assessment: Defer to OT evaluation    Lower Extremity Assessment Lower Extremity Assessment: Generalized weakness    Cervical / Trunk Assessment Cervical / Trunk Assessment: Back Surgery  Communication   Communication: No difficulties  Cognition Arousal/Alertness: Awake/alert Behavior During Therapy: WFL for tasks  assessed/performed Overall Cognitive Status: Within Functional Limits for tasks assessed                                          General Comments      Exercises     Assessment/Plan    PT Assessment Patient does not need any further PT services  PT Problem List         PT Treatment Interventions      PT Goals (Current goals can be found in the Care Plan section)  Acute Rehab PT Goals Patient Stated Goal: to go home PT Goal Formulation: All assessment and education complete, DC therapy    Frequency       Co-evaluation               AM-PAC PT "6 Clicks" Mobility  Outcome Measure Help needed turning from your back to your side while in a flat bed without using bedrails?: None Help needed moving from lying on your back to sitting on the side of a flat bed without using bedrails?: None Help needed moving to and from a bed to a chair (including a wheelchair)?: None Help needed standing up from a chair using your arms (e.g., wheelchair or bedside chair)?: None Help needed to walk in hospital room?: None Help needed climbing 3-5 steps with a railing? : None 6 Click Score: 24    End of Session Equipment Utilized During Treatment: Back brace Activity Tolerance: Patient tolerated treatment well Patient left: in bed;with call bell/phone within reach Nurse Communication: Mobility status PT Visit Diagnosis: Muscle weakness (generalized) (M62.81)    Time: 5361-4431 PT Time Calculation (min) (ACUTE ONLY): 13 min   Charges:   PT Evaluation $PT Eval Low Complexity: 1 Low          Mitcheal Sweetin A. Gilford Rile PT, DPT Acute Rehabilitation Services Office [phone]   Linna Hoff 09/01/2022, 9:15 AM

## 2022-09-02 ENCOUNTER — Encounter (HOSPITAL_COMMUNITY): Payer: Self-pay | Admitting: Neurosurgery

## 2022-09-06 ENCOUNTER — Telehealth: Payer: Self-pay | Admitting: *Deleted

## 2022-09-06 ENCOUNTER — Encounter: Payer: Self-pay | Admitting: *Deleted

## 2022-09-06 NOTE — Patient Outreach (Signed)
  Care Coordination [facility] Note Transition Care Management Unsuccessful Follow-up Telephone Call  Date of discharge and from where:  Thursday, 09/01/22 [facility]- lumbar fusion  Attempts:  1st Attempt  Reason for unsuccessful TCM follow-up call:  Left voice message  Oneta Rack, RN, BSN, CCRN Alumnus RN CM Care Coordination/ Transition of [place] Management [phone]: direct office

## 2022-09-06 NOTE — Patient Outreach (Signed)
  Care Coordination [facility] Note Transition Care Management Follow-up Telephone Call Date of discharge and from where: Thursday, 09/01/22- [place]; lumbar fusion How have you been since you were released from the hospital? "I am doing really well.  Able to do most everything by myself, but my wife is handling the medication-- she always has.  Not having any problems or concerns; pain medicine and muscle relaxers helping.  I'll be going to see Dr. Saintclair Halsted next week, I hope he tells me it is okay for me to resume driving.  I am eating good and getting stronger and less sore every day" Any questions or concerns? No  Items Reviewed: Did the pt receive and understand the discharge instructions provided? Yes  Medications obtained and verified? Yes  Other? No  Any new allergies since your discharge? No  Dietary orders reviewed? Yes Do you have support at home? Yes  spouse assisting with all care needs as needed/ indicated  Home Care and Equipment/Supplies: Were home health services ordered? no If so, what is the name of the agency? N/A  Has the agency set up a time to come to the patient's home? no Were any new equipment or medical supplies ordered?  No What is the name of the medical supply agency? N/A Were you able to get the supplies/equipment? not applicable Do you have any questions related to the use of the equipment or supplies? No N/A  Functional Questionnaire: (I = Independent and D = Dependent) ADLs: I  spouse assisting with all care needs as needed  Bathing/Dressing- I  Meal Prep- I  spouse assisting with all care needs as needed  Eating- I  Maintaining continence- I  Transferring/Ambulation- I  Managing Meds- D wife handles patient's medications  Follow up appointments reviewed:  PCP Hospital f/u appt confirmed? No  Scheduled to see - on - @ [facility] f/u appt confirmed? Yes  Scheduled to see neurosurgical provider, Dr. Saintclair Halsted on Tuesday, 09/13/22 @ 2:45 pm Are  transportation arrangements needed? No  If their condition worsens, is the pt aware to call PCP or go to the Emergency Dept.? Yes Was the patient provided with contact information for the PCP's office or ED? No- declines need- states he has phone numbers Was to pt encouraged to call back with questions or concerns? Yes  SDOH assessments and interventions completed:   Yes  Care Coordination Interventions Activated:  No   Care Coordination Interventions:  No Care Coordination interventions needed at this time.   Encounter Outcome:  Pt. Visit Completed    Oneta Rack, RN, BSN, CCRN Alumnus RN CM Care Coordination/ Transition of [place] Management [phone]: direct office

## 2022-09-13 DIAGNOSIS — M960 Pseudarthrosis after fusion or arthrodesis: Secondary | ICD-10-CM | POA: Diagnosis not present

## 2022-09-20 ENCOUNTER — Other Ambulatory Visit
Admission: RE | Admit: 2022-09-20 | Discharge: 2022-09-20 | Disposition: A | Discharge status: Home / Self Care | Payer: Medicare PPO | Attending: Urology | Admitting: Urology

## 2022-09-20 DIAGNOSIS — N401 Enlarged prostate with lower urinary tract symptoms: Secondary | ICD-10-CM | POA: Insufficient documentation

## 2022-09-20 DIAGNOSIS — R351 Nocturia: Secondary | ICD-10-CM | POA: Insufficient documentation

## 2022-09-21 ENCOUNTER — Encounter: Payer: Self-pay | Admitting: Family Medicine

## 2022-09-21 ENCOUNTER — Ambulatory Visit (INDEPENDENT_AMBULATORY_CARE_PROVIDER_SITE_OTHER): Payer: Medicare PPO

## 2022-09-21 VITALS — Ht 73.0 in | Wt 245.0 lb

## 2022-09-21 DIAGNOSIS — Z0000 Encounter for general adult medical examination without abnormal findings: Secondary | ICD-10-CM

## 2022-09-21 NOTE — Progress Notes (Signed)
Subjective:   [name] is a 66 y.o. male who presents for Medicare Annual/Subsequent preventive examination.  I connected with  [name] on 09/21/22 by a audio enabled telemedicine application and verified that I am speaking with the correct person using two identifiers.  Patient Location: Home  Provider Location: Office/Clinic  I discussed the limitations of evaluation and management by telemedicine. The patient expressed understanding and agreed to proceed.   Review of Systems    Defer to PCP       Objective:    Today's Vitals   09/21/22 0835  Weight: 245 lb (111.1 kg)  Height: '6\' 1"'$  (1.854 m)   Body mass index is 32.32 kg/m.     08/22/2022    2:58 PM 02/20/2022   11:15 AM 10/03/2021    8:55 AM 09/20/2021    8:52 AM 07/27/2021   10:34 AM 06/18/2021    3:00 PM 06/18/2021    2:00 PM  Advanced Directives  Does Patient Have a Medical Advance Directive? Yes Yes Yes Yes No  Yes  Type of Paramedic of [place];Living will [place];Living will Healthcare Power of [place];Living will  [place];Living will [place];Living will  Does patient want to make changes to medical advance directive? No - Patient declined     No - Patient declined   Copy of [place] in Chart? Yes - validated most recent copy scanned in chart (See row information)   Yes - validated most recent copy scanned in chart (See row information)  Yes - validated most recent copy scanned in chart (See row information)   Would patient like information on creating a medical advance directive?     No - Patient declined      Current Medications (verified) Outpatient Encounter Medications as of 09/21/2022  Medication Sig   acetaminophen (TYLENOL) 325 MG tablet Take 650 mg by mouth every 6 (six) hours as needed for moderate pain.   apixaban (ELIQUIS) 5 MG TABS tablet Take  1 tablet (5 mg total) by mouth 2 (two) times daily. Restart Wednesday, November 16   celecoxib (CELEBREX) 200 MG capsule Take 200 mg by mouth daily.   cyclobenzaprine (FLEXERIL) 10 MG tablet Take 1 tablet (10 mg total) by mouth 3 (three) times daily as needed for muscle spasms.   diltiazem (CARDIZEM CD) 180 MG 24 hr capsule Take 1 capsule (180 mg total) by mouth 2 (two) times daily.   ferrous sulfate 325 (65 FE) MG tablet Take 325 mg by mouth daily with breakfast.   fexofenadine (ALLEGRA) 180 MG tablet Take 1 tablet (180 mg total) by mouth daily.   fluticasone (FLONASE) 50 MCG/ACT nasal spray Place 2 sprays into both nostrils daily. (Patient taking differently: Place 2 sprays into both nostrils daily as needed for allergies.)   hydrochlorothiazide (HYDRODIURIL) 12.5 MG tablet Take 1 tablet (12.5 mg total) by mouth daily.   HYDROcodone-acetaminophen (NORCO/VICODIN) 5-325 MG tablet Take 2 tablets by mouth every 4 (four) hours as needed for severe pain ((score 7 to 10)).   metoprolol succinate (TOPROL-XL) 25 MG 24 hr tablet Take 1 tablet (25 mg total) by mouth daily.   montelukast (SINGULAIR) 10 MG tablet Take 1 tablet (10 mg total) by mouth at bedtime.   Multiple Vitamins-Minerals (MULTIVITAMIN MEN) TABS Take 1 tablet by mouth daily.   mupirocin ointment (BACTROBAN) 2 % Apply 1 Application topically 2 (two) times daily.  Olopatadine HCl 0.2 % SOLN Place 1 drop into both eyes 2 (two) times daily as needed (allergies).   pantoprazole (PROTONIX) 40 MG tablet Take 1 tablet (40 mg total) by mouth every evening.   potassium chloride SA (KLOR-CON M) 20 MEQ tablet Take 1-2 tablets (20-40 mEq total) by mouth See admin instructions. Take 20 mEq by mouth in the morning and 40 mEq in the evening   rosuvastatin (CRESTOR) 5 MG tablet TAKE 1 TABLET BY MOUTH THREE TIMES A WEEK   tadalafil (CIALIS) 5 MG tablet Take 1-2 tablets (5-10 mg total) by mouth daily as needed for erectile dysfunction.   tamsulosin (FLOMAX)  0.4 MG CAPS capsule Take 1 capsule (0.4 mg total) by mouth daily.   traMADol (ULTRAM) 50 MG tablet Take 1 tablet by mouth every 6 (six) hours as needed for moderate pain.   valsartan (DIOVAN) 320 MG tablet Take 1 tablet (320 mg total) by mouth daily.   No facility-administered encounter medications on file as of 09/21/2022.    Allergies (verified) Meloxicam and Sulfa antibiotics   History: Past Medical History:  Diagnosis Date   Acquired deafness, right    per pt completed deafness right ear since age 32   Allergic rhinitis, seasonal    Anticoagulant long-term use    eliquis--- managed by cardiology   BPH associated with nocturia    urologist--- dr Tresa Moore   Cancer [facility])    basal and squamous cell   Chronic low back pain    DDD (degenerative disc disease), lumbosacral    Foraminal stenosis of lumbosacral region    GERD (gastroesophageal reflux disease)    Heart murmur    History of kidney stones    History of skin cancer    Hydronephrosis, left    Hyperlipidemia, mixed    Hypertension, essential    followed by pcp and cardiology   Hypokalemia    OA (osteoarthritis)    Paroxysmal atrial fibrillation ([place]) 01/28/2017   cardiologist--- dr Nehemiah Massed;   pt had ETT 03-16-2017 per cardio note normal, event monitor 02-09-2017 showed baseline SR some peroids  Afib with controlled rate with occ PAC/ PVC;  per pt had echo many yrs ago prior to AFib   Past Surgical History:  Procedure Laterality Date   ABDOMINAL EXPOSURE N/A 01/18/2021   Procedure: ABDOMINAL EXPOSURE;  Surgeon: [name], MD;  Location: [place];  Service: Vascular;  Laterality: N/A;   ANTERIOR LUMBAR FUSION N/A 01/18/2021   Procedure: Anterior Lumbar Interbody Fusion - Lumbar Five-Sarcal One;  Surgeon: Kary Kos, MD;  Location: [place];  Service: Neurosurgery;  Laterality: N/A;  Anterior Lumbar Interbody Fusion - Lumbar Five-Sacral One   CYSTOSCOPY W/ URETERAL STENT PLACEMENT Left 06/18/2021   Procedure:  CYSTOSCOPY WITH RETROGRADE PYELOGRAM/URETERAL STENT EXCHANGE;  Surgeon: Alexis Frock, MD;  Location: [facility];  Service: Urology;  Laterality: Left;   CYSTOSCOPY WITH RETROGRADE PYELOGRAM, URETEROSCOPY AND STENT PLACEMENT Left 04/21/2021   Procedure: CYSTOSCOPY WITH RETROGRADE PYELOGRAM, DIAGNOSTICURETEROSCOPY AND STENT PLACEMENT;  Surgeon: Alexis Frock, MD;  Location: [facility];  Service: Urology;  Laterality: Left;  1 HR   EXTRACORPOREAL SHOCK WAVE LITHOTRIPSY     approx 2002   LUMBAR [place] SURGERY  2018   LUMBAR LAMINECTOMY/DECOMPRESSION MICRODISCECTOMY Right 08/31/2022   Procedure: Redo foraminotomy right Lumbar four-five;  Surgeon: Kary Kos, MD;  Location: [place];  Service: Neurosurgery;  Laterality: Right;   REPAIR DURAL / CSF LEAK  2018   post op lumbar diskectomy  REVISION TOTAL KNEE ARTHROPLASTY Left 2015   TOTAL KNEE ARTHROPLASTY Left 2012   TRANSFORAMINAL LUMBAR INTERBODY FUSION (TLIF) WITH PEDICLE SCREW FIXATION 1 LEVEL N/A 06/12/2019   Procedure: LUMBAR FOUR-FIVE TRANSFORAMINAL LUMBAR INTERBODY FUSION (TLIF) WITH PEDICLE SCREW FIXATION;  Surgeon: Kary Kos, MD;  Location: [place];  Service: Neurosurgery;  Laterality: N/A;   UMBILICAL HERNIA REPAIR  1992   Family History  Problem Relation Age of Onset   Cancer Mother    Cancer Father    Cancer Brother    Hyperlipidemia Brother    Social History   Socioeconomic History   Marital status: Married    Spouse name: Ifeanyichukwu Wickham   Number of children: Not on file   Years of education: Not on file   Highest education level: Not on file  Occupational History   Not on file  Tobacco Use   Smoking status: Never   Smokeless tobacco: Former    Types: Sarina Ser    Quit date: 03/2020  Vaping Use   Vaping Use: Never used  Substance and Sexual Activity   Alcohol use: Yes    Comment: ocassional   Drug use: Never   Sexual activity: Yes  Other Topics Concern   Not on file  Social History Narrative   Not on file    Social Determinants of Health   Financial Resource Strain: Low Risk  (09/20/2021)   Overall Financial Resource Strain (CARDIA)    Difficulty of Paying Living Expenses: Not hard at all  Food Insecurity: No Food Insecurity (09/06/2022)   Hunger Vital Sign    Worried About Running Out of Food in the Last Year: Never true    [place] in the Last Year: Never true  Transportation Needs: No Transportation Needs (09/06/2022)   PRAPARE - Hydrologist (Medical): No    Lack of Transportation (Non-Medical): No  Physical Activity: Inactive (09/20/2021)   Exercise Vital Sign    Days of Exercise per Week: 0 days    Minutes of Exercise per Session: 0 min  Stress: No Stress Concern Present (09/20/2021)   [place]    Feeling of Stress : Not at all  Social Connections: [place] (09/20/2021)   Social Connection and Isolation Panel [NHANES]    Frequency of Communication with Friends and Family: More than three times a week    Frequency of Social Gatherings with Friends and Family: More than three times a week    Attends Religious Services: More than 4 times per year    Active Member of [employer] or Organizations: Yes    Attends Music therapist: More than 4 times per year    Marital Status: Married    Tobacco Counseling Counseling given: Not Answered   Clinical Intake:                 Diabetic? No.         Activities of Daily Living    08/22/2022    3:00 PM 08/22/2022    2:56 PM  In your present state of health, do you have any difficulty performing the following activities:  Hearing?  1  Comment  deaf in right ear  Vision?  0  Difficulty concentrating or making decisions?  0  Walking or climbing stairs?  0  Dressing or bathing?  0  Doing errands, shopping? 0     Patient Care Team: Juline Patch, MD as PCP -  General (Family  Medicine)  Indicate any recent Medical Services you may have received from other than [facility] providers in the past year (date may be approximate).     Assessment:   This is a routine wellness examination for Jye.  Hearing/Vision screen No results found.  Dietary issues and exercise activities discussed:     Goals Addressed   None   Depression Screen    09/06/2022    2:07 PM 08/24/2022   10:05 AM 06/21/2022   10:59 AM 05/24/2022    8:25 AM 03/22/2022   10:20 AM 02/21/2022    8:08 AM 09/20/2021    4:13 PM  PHQ 2/9 Scores  PHQ - 2 Score 0 0 0 0 0 0 0  PHQ- 9 Score  0 0 0 0 0 0    Fall Risk    08/24/2022   10:05 AM 06/21/2022   10:59 AM 05/24/2022    8:25 AM 02/21/2022    8:09 AM 09/20/2021    4:14 PM  Fall Risk   Falls in the past year? 0 0 0 0 0  Number falls in past yr: 0 0 0 0 0  Injury with Fall? 0 0 0 0 0  Risk for fall due to : No Fall Risks No Fall Risks No Fall Risks No Fall Risks No Fall Risks  Follow up Falls evaluation completed Falls evaluation completed Falls evaluation completed Falls evaluation completed Falls evaluation completed    FALL RISK PREVENTION PERTAINING TO THE HOME:  Any stairs in or around the home? Yes  If so, are there any without handrails? Yes  Home free of loose throw rugs in walkways, pet beds, electrical cords, etc? Yes  Adequate lighting in your home to reduce risk of falls? Yes   ASSISTIVE DEVICES UTILIZED TO PREVENT FALLS:  Life alert? No  Use of a cane, walker or w/c? Yes  Cane- Wearing Back Brace from Surgery Grab bars in the bathroom? Yes  Shower chair or bench in shower? No  Elevated toilet seat or a handicapped toilet? No   Cognitive Function:        Immunizations Immunization History  Administered Date(s) Administered   Fluad Quad(high Dose 65+) 08/24/2021   Influenza, High Dose Seasonal PF 07/23/2019   Influenza,inj,Quad PF,6+ Mos 07/30/2015, 07/26/2016, 09/18/2018, 06/21/2022   Influenza-Unspecified 07/24/2020    Moderna Sars-Covid-2 Vaccination 10/22/2019, 11/22/2019, 09/09/2020   PNEUMOCOCCAL CONJUGATE-20 08/24/2022   Pneumococcal Conjugate-13 08/07/2019   Pneumococcal Polysaccharide-23 07/30/2015, 12/16/2020   Tdap 06/21/2012    TDAP status: Due, Education has been provided regarding the importance of this vaccine. Advised may receive this vaccine at local pharmacy or Health Dept. Aware to provide a copy of the vaccination record if obtained from local pharmacy or Health Dept. Verbalized acceptance and understanding.  Flu Vaccine status: Up to date  Pneumococcal vaccine status: Up to date  Covid-19 vaccine status: Completed vaccines  Qualifies for Shingles Vaccine? Yes   Zostavax completed No   Shingrix Completed?: No.    Education has been provided regarding the importance of this vaccine. Patient has been advised to call insurance company to determine out of pocket expense if they have not yet received this vaccine. Advised may also receive vaccine at local pharmacy or Health Dept. Verbalized acceptance and understanding.  Screening Tests Health Maintenance  Topic Date Due   COVID-19 Vaccine (4 - 2023-24 season) 06/24/2022   Zoster Vaccines- Shingrix (1 of 2) 09/21/2022 (Originally 03/15/1975)   COLONOSCOPY (Pts 45-70yr Insurance coverage will  need to be confirmed)  11/07/2022   Medicare Annual Wellness (AWV)  09/22/2023   Pneumonia Vaccine 74+ Years old  Completed   INFLUENZA VACCINE  Completed   Hepatitis C Screening  Completed   HPV VACCINES  Aged Out    Health Maintenance  Health Maintenance Due  Topic Date Due   COVID-19 Vaccine (4 - 2023-24 season) 06/24/2022    Colorectal cancer screening: Type of screening: Colonoscopy. Completed 11/08/2019. Repeat every 3 years  Lung Cancer Screening: (Low Dose CT Chest recommended if Age 52-80 years, 30 pack-year currently smoking OR have quit w/in 15years.) does not qualify.   Lung Cancer Screening Referral: N/A  Additional  Screening:  Hepatitis C Screening: does qualify; Completed 12/08/2016  Vision Screening: Recommended annual ophthalmology exams for early detection of glaucoma and other disorders of the eye. Is the patient up to date with their annual eye exam?  Yes  Who is the provider or what is the name of the office in which the patient attends annual eye exams? [facility] [place] If pt is not established with a provider, would they like to be referred to a provider to establish care? No .   Dental Screening: Recommended annual dental exams for proper oral hygiene  Community Resource Referral / Chronic Care Management: CRR required this visit?  No   CCM required this visit?  No      Plan:     I have personally reviewed and noted the following in the patient's chart:   Medical and social history Use of alcohol, tobacco or illicit drugs  Current medications and supplements including opioid prescriptions. Patient is not currently taking opioid prescriptions. Functional ability and status Nutritional status Physical activity Advanced directives List of other physicians Hospitalizations, surgeries, and ER visits in previous 12 months Vitals Screenings to include cognitive, depression, and falls Referrals and appointments  In addition, I have reviewed and discussed with patient certain preventive protocols, quality metrics, and best practice recommendations. A written personalized care plan for preventive services as well as general preventive health recommendations were provided to patient.     Clista Bernhardt, [place]   09/21/2022     Mr. Yearwood , Thank you for taking time to come for your Medicare Wellness Visit. I appreciate your ongoing commitment to your health goals. Please review the following plan we discussed and let me know if I can assist you in the future.   These are the goals we discussed:  Goals      Increase physical activity     Recommend increasing physical  activity as tolerated        This is a list of the screening recommended for you and due dates:  Health Maintenance  Topic Date Due   COVID-19 Vaccine (4 - 2023-24 season) 06/24/2022   Zoster (Shingles) Vaccine (1 of 2) 09/21/2022*   Colon Cancer Screening  11/07/2022   Medicare Annual Wellness Visit  09/22/2023   Pneumonia Vaccine  Completed   Flu Shot  Completed   Hepatitis C Screening: USPSTF Recommendation to screen - Ages 18-79 yo.  Completed   HPV Vaccine  Aged Out  *Topic was postponed. The date shown is not the original due date.     Nurse Notes: None.

## 2022-09-22 LAB — PSA (REFLEX TO FREE) (SERIAL): Prostate Specific Ag, Serum: 3.5 ng/mL (ref 0.0–4.0)

## 2022-10-04 ENCOUNTER — Encounter: Payer: Self-pay | Admitting: Urology

## 2022-10-04 ENCOUNTER — Ambulatory Visit: Payer: Medicare PPO | Admitting: Urology

## 2022-10-04 VITALS — BP 178/97 | HR 93 | Ht 73.0 in | Wt 246.0 lb

## 2022-10-04 DIAGNOSIS — N529 Male erectile dysfunction, unspecified: Secondary | ICD-10-CM | POA: Diagnosis not present

## 2022-10-04 DIAGNOSIS — N401 Enlarged prostate with lower urinary tract symptoms: Secondary | ICD-10-CM

## 2022-10-04 DIAGNOSIS — Z125 Encounter for screening for malignant neoplasm of prostate: Secondary | ICD-10-CM

## 2022-10-04 DIAGNOSIS — N135 Crossing vessel and stricture of ureter without hydronephrosis: Secondary | ICD-10-CM

## 2022-10-04 DIAGNOSIS — N138 Other obstructive and reflux uropathy: Secondary | ICD-10-CM | POA: Diagnosis not present

## 2022-10-04 DIAGNOSIS — R351 Nocturia: Secondary | ICD-10-CM | POA: Diagnosis not present

## 2022-10-04 MED ORDER — TADALAFIL 5 MG PO TABS: 5.0000 mg | ORAL_TABLET | Freq: Every day | ORAL | 3 refills | Status: DC | PRN

## 2022-10-04 MED ORDER — TAMSULOSIN HCL 0.4 MG PO CAPS: 0.4000 mg | ORAL_CAPSULE | Freq: Every day | ORAL | 3 refills | Status: DC

## 2022-10-04 NOTE — Addendum Note (Signed)
Addended by: Donalee Citrin on: 10/04/2022 08:46 AM   Modules accepted: Orders

## 2022-10-04 NOTE — Progress Notes (Addendum)
   10/04/2022 8:16 AM   [name] 03/04/1956 [id number]  Reason for visit: Follow up , BPH, ED, left ureteral stricture  HPI: 66 year old male who I have been following for an elevated PSA, BPH and ED.  Briefly, he had an elevated PSA of 11 after history of urinary retention and UTI after back surgery, which remained elevated at 10.4 at follow-up.  Our PA ordered a prostate MRI which showed a 70 g prostate with a PI-RADS 3 lesion, and fusion guided biopsy in [facility] showed only benign prostatic tissue with chronic inflammation and necrotizing granuloma in the region of interest.  PSA down trended to 4.5 in October 2020, and PSA values were likely elevated secondary to infection after his episode of urinary retention.  PSA ultimately trended back to normal at 2.4 in November 2021.  PSA from November 2023 is stable at 3.4.  We again reviewed the risks and benefits of PSA screening, and that typically screening is discontinued at age 63.  PSA density is very reassuring at 0.05.  He continues on Flomax which is working well for his urinary symptoms and he really has no urinary complaints today.  PVR is normal at 55 mL.  He is also using the Cialis as needed which is working for the erections.  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 Tresa Moore at [facility] urology in [place] who ultimately performed a left robotic ureteral reimplant and psoas hitch on 06/18/2021.  I personally viewed and interpreted the most recent CT lumbar spine from June 2023 that shows no hydronephrosis, and bladder changes consistent with psoas hitch on the left side.  -Flomax and Cialis refilled -RTC 1 year PVR, PSA reflex to free prior   [name], MD  [facility] [address], [place], [place] [address] [phone]

## 2022-10-04 NOTE — Patient Instructions (Signed)

## 2022-10-06 ENCOUNTER — Other Ambulatory Visit: Payer: Self-pay | Admitting: Family Medicine

## 2022-10-06 NOTE — Telephone Encounter (Signed)
Requested medication (s) are due for refill today: yes  Requested medication (s) are on the active medication list: yes  Last refill:  10/03/21  Future visit scheduled: yes  Notes to clinic:  ordered by another prescriber Nelwyn Salisbury PA-C from [facility]   Requested Prescriptions  Pending Prescriptions Disp Refills   fluticasone (FLONASE) 50 MCG/ACT nasal spray [Pharmacy Med Name: Fluticasone Propionate 50 MCG/ACT Nasal Suspension] 16 g 0    Sig: Use 2 spray(s) in each nostril once daily     Ear, Nose, and Throat: Nasal Preparations - Corticosteroids Passed - 10/06/2022  9:56 AM      Passed - Valid encounter within last 12 months    Recent Outpatient Visits           1 month ago Prediabetes   [facility] [place], [place], MD   3 months ago Cellulitis of face   [facility] [place], Deanna C, MD   4 months ago Acute maxillary sinusitis, recurrence not specified   [facility] [place], [place], MD   6 months ago Essential hypertension   [facility] [place], [place], MD   7 months ago Essential hypertension   [facility] at [place], [place], MD       Future Appointments             In 4 months Juline Patch, MD [facility] at [facility], [facility]   In 1 year [place], Herbert Seta, [facility] [place]

## 2022-10-16 ENCOUNTER — Other Ambulatory Visit: Payer: Self-pay | Admitting: Family Medicine

## 2022-10-16 DIAGNOSIS — E876 Hypokalemia: Secondary | ICD-10-CM

## 2022-10-21 DIAGNOSIS — Z6833 Body mass index (BMI) 33.0-33.9, adult: Secondary | ICD-10-CM | POA: Diagnosis not present

## 2022-10-21 DIAGNOSIS — S32009K Unspecified fracture of unspecified lumbar vertebra, subsequent encounter for fracture with nonunion: Secondary | ICD-10-CM | POA: Diagnosis not present

## 2022-11-02 DIAGNOSIS — C44612 Basal cell carcinoma of skin of right upper limb, including shoulder: Secondary | ICD-10-CM | POA: Diagnosis not present

## 2022-11-02 DIAGNOSIS — D485 Neoplasm of uncertain behavior of skin: Secondary | ICD-10-CM | POA: Diagnosis not present

## 2022-11-16 DIAGNOSIS — Z859 Personal history of malignant neoplasm, unspecified: Secondary | ICD-10-CM | POA: Diagnosis not present

## 2022-11-16 DIAGNOSIS — L570 Actinic keratosis: Secondary | ICD-10-CM | POA: Diagnosis not present

## 2022-11-16 DIAGNOSIS — M1711 Unilateral primary osteoarthritis, right knee: Secondary | ICD-10-CM | POA: Diagnosis not present

## 2022-11-16 DIAGNOSIS — L853 Xerosis cutis: Secondary | ICD-10-CM | POA: Diagnosis not present

## 2022-11-16 DIAGNOSIS — Z85828 Personal history of other malignant neoplasm of skin: Secondary | ICD-10-CM | POA: Diagnosis not present

## 2022-11-16 DIAGNOSIS — L578 Other skin changes due to chronic exposure to nonionizing radiation: Secondary | ICD-10-CM | POA: Diagnosis not present

## 2022-11-16 DIAGNOSIS — D225 Melanocytic nevi of trunk: Secondary | ICD-10-CM | POA: Diagnosis not present

## 2022-12-01 DIAGNOSIS — Z6832 Body mass index (BMI) 32.0-32.9, adult: Secondary | ICD-10-CM | POA: Diagnosis not present

## 2022-12-01 DIAGNOSIS — S32009K Unspecified fracture of unspecified lumbar vertebra, subsequent encounter for fracture with nonunion: Secondary | ICD-10-CM | POA: Diagnosis not present

## 2022-12-05 ENCOUNTER — Encounter: Payer: Self-pay | Admitting: Family Medicine

## 2022-12-05 ENCOUNTER — Ambulatory Visit: Payer: Medicare PPO | Admitting: Family Medicine

## 2022-12-05 VITALS — BP 128/78 | HR 70 | Temp 99.3°F | Ht 73.0 in | Wt 243.0 lb

## 2022-12-05 DIAGNOSIS — J0100 Acute maxillary sinusitis, unspecified: Secondary | ICD-10-CM | POA: Diagnosis not present

## 2022-12-05 DIAGNOSIS — J029 Acute pharyngitis, unspecified: Secondary | ICD-10-CM

## 2022-12-05 DIAGNOSIS — R051 Acute cough: Secondary | ICD-10-CM | POA: Diagnosis not present

## 2022-12-05 LAB — POCT RAPID STREP A (OFFICE): Rapid Strep A Screen: NEGATIVE

## 2022-12-05 LAB — POC COVID19 BINAXNOW: SARS Coronavirus 2 Ag: NEGATIVE

## 2022-12-05 MED ORDER — GUAIFENESIN-CODEINE 100-10 MG/5ML PO SYRP: 5.0000 mL | ORAL_SOLUTION | Freq: Three times a day (TID) | ORAL | 0 refills | Status: DC | PRN

## 2022-12-05 MED ORDER — AMOXICILLIN 500 MG PO CAPS: 500.0000 mg | ORAL_CAPSULE | Freq: Three times a day (TID) | ORAL | 0 refills | Status: AC

## 2022-12-05 NOTE — Progress Notes (Signed)
Date:  12/05/2022   Name:  [name]   DOB:  Dec 13, 1955   MRN:  [id number]   Chief Complaint: Cough (Started with cong and scratchy throat Saturday. Cough started last night- yellow production, headache)  Sore Throat  This is a new problem. The current episode started in the past 7 days (saturday). The problem has been waxing and waning. The maximum temperature recorded prior to his arrival was 100.4 - 100.9 F. The pain is mild. Associated symptoms include coughing and headaches. Pertinent negatives include no abdominal pain, shortness of breath or trouble swallowing. He has had no exposure to strep or mono. He has tried nothing for the symptoms. The treatment provided mild relief.    Lab Results  Component Value Date   NA 141 08/22/2022   K 3.7 08/22/2022   CO2 27 08/22/2022   GLUCOSE 140 (H) 08/22/2022   BUN 13 08/22/2022   CREATININE 1.03 08/22/2022   CALCIUM 9.3 08/22/2022   EGFR 85 02/21/2022   GFRNONAA >60 08/22/2022   Lab Results  Component Value Date   CHOL 128 06/15/2022   HDL 46 06/15/2022   LDLCALC 67 06/15/2022   TRIG 76 06/15/2022   CHOLHDL 4.1 08/07/2019   Lab Results  Component Value Date   TSH 0.569 02/21/2022   Lab Results  Component Value Date   HGBA1C 6.1 (H) 08/24/2022   Lab Results  Component Value Date   WBC 6.0 08/22/2022   HGB 13.9 08/22/2022   HCT 43.2 08/22/2022   MCV 84.0 08/22/2022   PLT 167 08/22/2022   Lab Results  Component Value Date   ALT 16 06/15/2022   AST 13 06/15/2022   ALKPHOS 96 06/15/2022   BILITOT 0.4 06/15/2022   No results found for: "25OHVITD2", "25OHVITD3", "VD25OH"   Review of Systems  HENT:  Negative for trouble swallowing.   Respiratory:  Positive for cough. Negative for shortness of breath.   Gastrointestinal:  Negative for abdominal pain.  Neurological:  Positive for headaches.    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   Hypokalemia  08/24/2021   Bacteremia due to Gram-negative bacteria    AF (paroxysmal atrial fibrillation) (HCC)    SIRS (systemic inflammatory response syndrome) ([place]) 07/27/2021   Sepsis secondary to UTI ([place]) 07/27/2021   Ureteral stricture 06/18/2021   Sacroiliitis ([place]) 05/07/2021   Spinal stenosis of lumbosacral region 01/18/2021   Herniated nucleus pulposus, lumbar 03/16/2020   Body mass index (BMI) 33.0-33.9, adult 09/03/2019   Other intervertebral disc displacement, lumbar region 06/25/2019   HNP (herniated nucleus pulposus), lumbar 06/12/2019   UTI due to Klebsiella species 05/08/2019   Biceps tendonitis on right 07/27/2018   Spinal stenosis, 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   Lumbago with sciatica, unspecified side 11/14/2017   Primary osteoarthritis of right knee 10/12/2017   Taking multiple medications for chronic disease 07/30/2015   Reactive airway disease 07/01/2015   Allergic sinusitis 07/01/2015   Essential hypertension 07/01/2015   Valvular heart disease 07/01/2015   Metatarsalgia of left foot 06/10/2015   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    Allergies  Allergen Reactions   Meloxicam Palpitations   Sulfa Antibiotics Rash    Past Surgical History:  Procedure Laterality Date   ABDOMINAL EXPOSURE N/A 01/18/2021   Procedure: ABDOMINAL EXPOSURE;  Surgeon: [name], MD;  Location: [facility];  Service: Vascular;  Laterality: N/A;   ANTERIOR LUMBAR FUSION N/A 01/18/2021  Procedure: Anterior Lumbar Interbody Fusion - Lumbar Five-Sarcal One;  Surgeon: Kary Kos, MD;  Location: [facility];  Service: Neurosurgery;  Laterality: N/A;  Anterior Lumbar Interbody Fusion - Lumbar Five-Sacral One   CYSTOSCOPY W/ URETERAL STENT PLACEMENT Left 06/18/2021   Procedure: CYSTOSCOPY WITH RETROGRADE PYELOGRAM/URETERAL STENT EXCHANGE;  Surgeon: Alexis Frock, MD;  Location[facility];  Service: Urology;  Laterality: Left;   CYSTOSCOPY WITH RETROGRADE PYELOGRAM, URETEROSCOPY AND STENT  PLACEMENT Left 04/21/2021   Procedure: CYSTOSCOPY WITH RETROGRADE PYELOGRAM, DIAGNOSTICURETEROSCOPY AND STENT PLACEMENT;  Surgeon: Alexis Frock, MD;  Location: [facility];  Service: Urology;  Laterality: Left;  1 HR   EXTRACORPOREAL SHOCK WAVE LITHOTRIPSY     approx 2002   LUMBAR [place] SURGERY  2018   LUMBAR LAMINECTOMY/DECOMPRESSION MICRODISCECTOMY Right 08/31/2022   Procedure: Redo foraminotomy right Lumbar four-five;  Surgeon: Kary Kos, MD;  Location: [facility];  Service: Neurosurgery;  Laterality: Right;   REPAIR DURAL / CSF LEAK  2018   post op lumbar diskectomy   REVISION TOTAL KNEE ARTHROPLASTY Left 2015   TOTAL KNEE ARTHROPLASTY Left 2012   TRANSFORAMINAL LUMBAR INTERBODY FUSION (TLIF) WITH PEDICLE SCREW FIXATION 1 LEVEL N/A 06/12/2019   Procedure: LUMBAR FOUR-FIVE TRANSFORAMINAL LUMBAR INTERBODY FUSION (TLIF) WITH PEDICLE SCREW FIXATION;  Surgeon: Kary Kos, MD;  Location: [facility];  Service: Neurosurgery;  Laterality: N/A;   UMBILICAL HERNIA REPAIR  1992    Social History   Tobacco Use   Smoking status: Never   Smokeless tobacco: Former    Types: Chew    Quit date: 03/2020  Vaping Use   Vaping Use: Never used  Substance Use Topics   Alcohol use: Yes    Comment: ocassional   Drug use: Never     Medication list has been reviewed and updated.  Current Meds  Medication Sig   acetaminophen (TYLENOL) 325 MG tablet Take 650 mg by mouth every 6 (six) hours as needed for moderate pain.   apixaban (ELIQUIS) 5 MG TABS tablet Take 1 tablet (5 mg total) by mouth 2 (two) times daily. Restart Wednesday, November 16   celecoxib (CELEBREX) 200 MG capsule Take 200 mg by mouth daily.   cyclobenzaprine (FLEXERIL) 5 MG tablet Take 5 mg by mouth 3 (three) times daily as needed.   diltiazem (CARDIZEM CD) 180 MG 24 hr capsule Take 1 capsule (180 mg total) by mouth 2 (two) times daily.   ferrous sulfate 325 (65 FE) MG tablet Take 325 mg by mouth daily with breakfast.    fexofenadine (ALLEGRA) 180 MG tablet Take 1 tablet (180 mg total) by mouth daily.   fluticasone (FLONASE) 50 MCG/ACT nasal spray Place 2 sprays into both nostrils daily as needed for allergies.   hydrochlorothiazide (HYDRODIURIL) 12.5 MG tablet Take 1 tablet (12.5 mg total) by mouth daily.   HYDROcodone-acetaminophen (NORCO/VICODIN) 5-325 MG tablet Take 2 tablets by mouth every 4 (four) hours as needed for severe pain ((score 7 to 10)).   metoprolol succinate (TOPROL-XL) 25 MG 24 hr tablet Take 1 tablet (25 mg total) by mouth daily.   montelukast (SINGULAIR) 10 MG tablet Take 1 tablet (10 mg total) by mouth at bedtime.   Multiple Vitamins-Minerals (MULTIVITAMIN MEN) TABS Take 1 tablet by mouth daily.   pantoprazole (PROTONIX) 40 MG tablet Take 1 tablet (40 mg total) by mouth every evening.   potassium chloride SA (KLOR-CON M) 20 MEQ tablet TAKE 1 TO 2 TABLETS BY MOUTH TWICE DAILY IN THE MORNING AND IN THE EVENING  rosuvastatin (CRESTOR) 5 MG tablet TAKE 1 TABLET BY MOUTH THREE TIMES A WEEK   tadalafil (CIALIS) 5 MG tablet Take 1-2 tablets (5-10 mg total) by mouth daily as needed for erectile dysfunction.   tamsulosin (FLOMAX) 0.4 MG CAPS capsule Take 1 capsule (0.4 mg total) by mouth daily.   valsartan (DIOVAN) 320 MG tablet Take 1 tablet (320 mg total) by mouth daily.       12/05/2022    4:04 PM 08/24/2022   10:05 AM 06/21/2022   10:59 AM 05/24/2022    8:25 AM  GAD 7 : Generalized Anxiety Score  Nervous, Anxious, on Edge 0 0 0 0  Control/stop worrying 0 0 0 0  Worry too much - different things 0 0 0 0  Trouble relaxing 0 0 0 0  Restless 0 0 0 0  Easily annoyed or irritable 0 0 0 0  Afraid - awful might happen 0 0 0 0  Total GAD 7 Score 0 0 0 0  Anxiety Difficulty Not difficult at all Not difficult at all Not difficult at all Not difficult at all       12/05/2022    4:04 PM 09/21/2022    8:59 AM 09/06/2022    2:07 PM  Depression screen PHQ 2/9  Decreased Interest 0 0 0  Down,  Depressed, Hopeless 0 0 0  PHQ - 2 Score 0 0 0  Altered sleeping 0 0   Tired, decreased energy 0 0   Change in appetite 0 0   Feeling bad or failure about yourself  0 0   Trouble concentrating 0 0   Moving slowly or fidgety/restless 0 0   Suicidal thoughts 0 0   PHQ-9 Score 0 0   Difficult doing work/chores Not difficult at all Not difficult at all     BP Readings from Last 3 Encounters:  12/05/22 128/78  10/04/22 (!) 178/97  09/01/22 (!) 142/78    Physical Exam Vitals and nursing note reviewed.  HENT:     Head: Normocephalic.     Right Ear: Tympanic membrane and ear canal normal.     Left Ear: Tympanic membrane and ear canal normal.     Nose: Nose normal. No congestion or rhinorrhea.     Mouth/Throat:     Mouth: Mucous membranes are moist.     Pharynx: No oropharyngeal exudate or posterior oropharyngeal erythema.  Eyes:     Extraocular Movements: Extraocular movements intact.     Conjunctiva/sclera: Conjunctivae normal.     Pupils: Pupils are equal, round, and reactive to light.  Cardiovascular:     Rate and Rhythm: Normal rate.     Heart sounds: No murmur heard.    No friction rub. No gallop.  Pulmonary:     Breath sounds: No wheezing, rhonchi or rales.  Abdominal:     General: There is no distension.     Palpations: There is no mass.     Tenderness: There is no abdominal tenderness. There is no guarding or rebound.  Musculoskeletal:        General: No deformity or signs of injury.     Cervical back: Normal range of motion.  Skin:    Findings: No bruising or erythema.  Neurological:     Mental Status: He is alert.     Motor: No weakness.     Wt Readings from Last 3 Encounters:  12/05/22 243 lb (110.2 kg)  10/04/22 246 lb (111.6 kg)  09/21/22 245 lb (111.1 kg)  BP 128/78   Pulse 70   Temp 99.3 F (37.4 C) (Oral)   Ht 6' 1"$  (1.854 m)   Wt 243 lb (110.2 kg)   SpO2 95%   BMI 32.06 kg/m   Assessment and Plan:  1. Sore throat New onset.   Persistent.  Stable.  Patient has sore throat we will check for strep test which is negative.  Patient has been to a funeral which has multiple exposure points and COVID test was done and this also was negative. - POCT rapid strep A  2. Acute maxillary sinusitis, recurrence not specified Patient has a productive nasal discharge with tenderness over maxillary sinuses and will treat with amoxicillin 500 mg 3 times a day for 10 days. - amoxicillin (AMOXIL) 500 MG capsule; Take 1 capsule (500 mg total) by mouth 3 (three) times daily for 10 days.  Dispense: 30 capsule; Refill: 0  3. Acute cough Patient has been unable to sleep at night due to a persistent cough and we will treat with Robitussin AC a teaspoon every 6 hours as needed for cough. - guaiFENesin-codeine (ROBITUSSIN AC) 100-10 MG/5ML syrup; Take 5 mLs by mouth 3 (three) times daily as needed for cough.  Dispense: 118 mL; Refill: 0 - POC COVID-19    Otilio Miu, MD

## 2022-12-19 ENCOUNTER — Ambulatory Visit
Admission: RE | Admit: 2022-12-19 | Discharge: 2022-12-19 | Disposition: A | Discharge status: Home / Self Care | Payer: Medicare PPO | Attending: Family Medicine | Admitting: Family Medicine

## 2022-12-19 ENCOUNTER — Ambulatory Visit
Admission: RE | Admit: 2022-12-19 | Discharge: 2022-12-19 | Disposition: A | Discharge status: Home / Self Care | Payer: Medicare PPO | Source: Ambulatory Visit | Attending: Family Medicine | Admitting: Family Medicine

## 2022-12-19 ENCOUNTER — Encounter: Payer: Self-pay | Admitting: Family Medicine

## 2022-12-19 ENCOUNTER — Ambulatory Visit: Payer: Medicare PPO | Admitting: Family Medicine

## 2022-12-19 VITALS — BP 134/82 | HR 62 | Temp 97.6°F | Ht 73.0 in | Wt 240.0 lb

## 2022-12-19 DIAGNOSIS — J40 Bronchitis, not specified as acute or chronic: Secondary | ICD-10-CM

## 2022-12-19 DIAGNOSIS — J4521 Mild intermittent asthma with (acute) exacerbation: Secondary | ICD-10-CM

## 2022-12-19 DIAGNOSIS — R5081 Fever presenting with conditions classified elsewhere: Secondary | ICD-10-CM

## 2022-12-19 DIAGNOSIS — R059 Cough, unspecified: Secondary | ICD-10-CM | POA: Diagnosis not present

## 2022-12-19 DIAGNOSIS — R051 Acute cough: Secondary | ICD-10-CM

## 2022-12-19 LAB — POC COVID19 BINAXNOW: SARS Coronavirus 2 Ag: NEGATIVE

## 2022-12-19 MED ORDER — DOXYCYCLINE HYCLATE 100 MG PO TABS: 100.0000 mg | ORAL_TABLET | Freq: Two times a day (BID) | ORAL | 0 refills | Status: DC

## 2022-12-19 MED ORDER — ALBUTEROL SULFATE HFA 108 (90 BASE) MCG/ACT IN AERS: 2.0000 | INHALATION_SPRAY | Freq: Four times a day (QID) | RESPIRATORY_TRACT | 2 refills | Status: DC | PRN

## 2022-12-19 MED ORDER — PREDNISONE 10 MG PO TABS: 10.0000 mg | ORAL_TABLET | Freq: Every day | ORAL | 0 refills | Status: DC

## 2022-12-19 MED ORDER — GUAIFENESIN-CODEINE 100-10 MG/5ML PO SYRP: 5.0000 mL | ORAL_SOLUTION | Freq: Three times a day (TID) | ORAL | 0 refills | Status: DC | PRN

## 2022-12-19 NOTE — Progress Notes (Signed)
Date:  12/19/2022   Name:  [name]   DOB:  04/15/1956   MRN:  [id number]   Chief Complaint: Fever (Congestion, fever, no body aches, cough- green production- finished Amox on 12/15/22)  Fever  This is a new problem. The current episode started today. The problem has been unchanged. Associated symptoms include congestion, coughing, muscle aches, a sore throat and wheezing. Pertinent negatives include no chest pain, ear pain or headaches. He has tried acetaminophen for the symptoms. The treatment provided mild relief.  Risk factors: occupational exposure   Risk factors: no sick contacts   Cough This is a new problem. The current episode started in the past 7 days (Thursday). The problem occurs every few minutes. The cough is Productive of purulent sputum. Associated symptoms include chills, a fever, postnasal drip, a sore throat and wheezing. Pertinent negatives include no chest pain, ear pain, headaches, hemoptysis or shortness of breath. Risk factors for lung disease include occupational exposure. He has tried OTC cough suppressant for the symptoms. The treatment provided mild relief. There is no history of asthma, bronchiectasis, bronchitis, emphysema or environmental allergies.    Lab Results  Component Value Date   NA 141 08/22/2022   K 3.7 08/22/2022   CO2 27 08/22/2022   GLUCOSE 140 (H) 08/22/2022   BUN 13 08/22/2022   CREATININE 1.03 08/22/2022   CALCIUM 9.3 08/22/2022   EGFR 85 02/21/2022   GFRNONAA >60 08/22/2022   Lab Results  Component Value Date   CHOL 128 06/15/2022   HDL 46 06/15/2022   LDLCALC 67 06/15/2022   TRIG 76 06/15/2022   CHOLHDL 4.1 08/07/2019   Lab Results  Component Value Date   TSH 0.569 02/21/2022   Lab Results  Component Value Date   HGBA1C 6.1 (H) 08/24/2022   Lab Results  Component Value Date   WBC 6.0 08/22/2022   HGB 13.9 08/22/2022   HCT 43.2 08/22/2022   MCV 84.0 08/22/2022   PLT 167 08/22/2022   Lab Results  Component  Value Date   ALT 16 06/15/2022   AST 13 06/15/2022   ALKPHOS 96 06/15/2022   BILITOT 0.4 06/15/2022   No results found for: "25OHVITD2", "25OHVITD3", "VD25OH"   Review of Systems  Constitutional:  Positive for chills and fever.  HENT:  Positive for congestion, postnasal drip and sore throat. Negative for ear pain.   Respiratory:  Positive for cough and wheezing. Negative for hemoptysis and shortness of breath.   Cardiovascular:  Negative for chest pain.  Allergic/Immunologic: Negative for environmental allergies.  Neurological:  Negative for headaches.    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   Hypokalemia 08/24/2021   Bacteremia due to Gram-negative bacteria    AF (paroxysmal atrial fibrillation) (HCC)    SIRS (systemic inflammatory response syndrome) ([place]) 07/27/2021   Sepsis secondary to UTI ([place]) 07/27/2021   Ureteral stricture 06/18/2021   Sacroiliitis ([place]) 05/07/2021   Spinal stenosis of lumbosacral region 01/18/2021   Herniated nucleus pulposus, lumbar 03/16/2020   Body mass index (BMI) 33.0-33.9, adult 09/03/2019   Other intervertebral disc displacement, lumbar region 06/25/2019   HNP (herniated nucleus pulposus), lumbar 06/12/2019   UTI due to Klebsiella species 05/08/2019   Biceps tendonitis on right 07/27/2018   Spinal stenosis, 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   Lumbago with sciatica, unspecified side 11/14/2017   Primary osteoarthritis of right knee 10/12/2017   Taking multiple medications for chronic  disease 07/30/2015   Reactive airway disease 07/01/2015   Allergic sinusitis 07/01/2015   Essential hypertension 07/01/2015   Valvular heart disease 07/01/2015   Metatarsalgia of left foot 06/10/2015   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    Allergies  Allergen Reactions   Meloxicam Palpitations   Sulfa Antibiotics Rash    Past Surgical History:  Procedure Laterality Date   ABDOMINAL  EXPOSURE N/A 01/18/2021   Procedure: ABDOMINAL EXPOSURE;  Surgeon: [name], MD;  Location: [facility];  Service: Vascular;  Laterality: N/A;   ANTERIOR LUMBAR FUSION N/A 01/18/2021   Procedure: Anterior Lumbar Interbody Fusion - Lumbar Five-Sarcal One;  Surgeon: Kary Kos, MD;  Location: [facility];  Service: Neurosurgery;  Laterality: N/A;  Anterior Lumbar Interbody Fusion - Lumbar Five-Sacral One   CYSTOSCOPY W/ URETERAL STENT PLACEMENT Left 06/18/2021   Procedure: CYSTOSCOPY WITH RETROGRADE PYELOGRAM/URETERAL STENT EXCHANGE;  Surgeon: Alexis Frock, MD;  Location[facility];  Service: Urology;  Laterality: Left;   CYSTOSCOPY WITH RETROGRADE PYELOGRAM, URETEROSCOPY AND STENT PLACEMENT Left 04/21/2021   Procedure: CYSTOSCOPY WITH RETROGRADE PYELOGRAM, DIAGNOSTICURETEROSCOPY AND STENT PLACEMENT;  Surgeon: Alexis Frock, MD;  Location: [facility];  Service: Urology;  Laterality: Left;  1 HR   EXTRACORPOREAL SHOCK WAVE LITHOTRIPSY     approx 2002   LUMBAR [place] SURGERY  2018   LUMBAR LAMINECTOMY/DECOMPRESSION MICRODISCECTOMY Right 08/31/2022   Procedure: Redo foraminotomy right Lumbar four-five;  Surgeon: Kary Kos, MD;  Location: [place];  Service: Neurosurgery;  Laterality: Right;   REPAIR DURAL / CSF LEAK  2018   post op lumbar diskectomy   REVISION TOTAL KNEE ARTHROPLASTY Left 2015   TOTAL KNEE ARTHROPLASTY Left 2012   TRANSFORAMINAL LUMBAR INTERBODY FUSION (TLIF) WITH PEDICLE SCREW FIXATION 1 LEVEL N/A 06/12/2019   Procedure: LUMBAR FOUR-FIVE TRANSFORAMINAL LUMBAR INTERBODY FUSION (TLIF) WITH PEDICLE SCREW FIXATION;  Surgeon: Kary Kos, MD;  Location: [place];  Service: Neurosurgery;  Laterality: N/A;   UMBILICAL HERNIA REPAIR  1992    Social History   Tobacco Use   Smoking status: Never   Smokeless tobacco: Former    Types: Chew    Quit date: 03/2020  Vaping Use   Vaping Use: Never used  Substance Use Topics   Alcohol use: Yes    Comment: ocassional   Drug use:  Never     Medication list has been reviewed and updated.  Current Meds  Medication Sig   acetaminophen (TYLENOL) 325 MG tablet Take 650 mg by mouth every 6 (six) hours as needed for moderate pain.   apixaban (ELIQUIS) 5 MG TABS tablet Take 1 tablet (5 mg total) by mouth 2 (two) times daily. Restart Wednesday, November 16   celecoxib (CELEBREX) 200 MG capsule Take 200 mg by mouth daily.   cyclobenzaprine (FLEXERIL) 5 MG tablet Take 5 mg by mouth 3 (three) times daily as needed.   diltiazem (CARDIZEM CD) 180 MG 24 hr capsule Take 1 capsule (180 mg total) by mouth 2 (two) times daily.   ferrous sulfate 325 (65 FE) MG tablet Take 325 mg by mouth daily with breakfast.   fexofenadine (ALLEGRA) 180 MG tablet Take 1 tablet (180 mg total) by mouth daily.   fluticasone (FLONASE) 50 MCG/ACT nasal spray Place 2 sprays into both nostrils daily as needed for allergies.   guaiFENesin-codeine (ROBITUSSIN AC) 100-10 MG/5ML syrup Take 5 mLs by mouth 3 (three) times daily as needed for cough.   hydrochlorothiazide (HYDRODIURIL) 12.5 MG tablet Take 1 tablet (12.5 mg total) by mouth  daily.   metoprolol succinate (TOPROL-XL) 25 MG 24 hr tablet Take 1 tablet (25 mg total) by mouth daily.   montelukast (SINGULAIR) 10 MG tablet Take 1 tablet (10 mg total) by mouth at bedtime.   Multiple Vitamins-Minerals (MULTIVITAMIN MEN) TABS Take 1 tablet by mouth daily.   pantoprazole (PROTONIX) 40 MG tablet Take 1 tablet (40 mg total) by mouth every evening.   potassium chloride SA (KLOR-CON M) 20 MEQ tablet TAKE 1 TO 2 TABLETS BY MOUTH TWICE DAILY IN THE MORNING AND IN THE EVENING   rosuvastatin (CRESTOR) 5 MG tablet TAKE 1 TABLET BY MOUTH THREE TIMES A WEEK   tadalafil (CIALIS) 5 MG tablet Take 1-2 tablets (5-10 mg total) by mouth daily as needed for erectile dysfunction.   tamsulosin (FLOMAX) 0.4 MG CAPS capsule Take 1 capsule (0.4 mg total) by mouth daily.   valsartan (DIOVAN) 320 MG tablet Take 1 tablet (320 mg total) by  mouth daily.       12/05/2022    4:04 PM 08/24/2022   10:05 AM 06/21/2022   10:59 AM 05/24/2022    8:25 AM  GAD 7 : Generalized Anxiety Score  Nervous, Anxious, on Edge 0 0 0 0  Control/stop worrying 0 0 0 0  Worry too much - different things 0 0 0 0  Trouble relaxing 0 0 0 0  Restless 0 0 0 0  Easily annoyed or irritable 0 0 0 0  Afraid - awful might happen 0 0 0 0  Total GAD 7 Score 0 0 0 0  Anxiety Difficulty Not difficult at all Not difficult at all Not difficult at all Not difficult at all       12/05/2022    4:04 PM 09/21/2022    8:59 AM 09/06/2022    2:07 PM  Depression screen PHQ 2/9  Decreased Interest 0 0 0  Down, Depressed, Hopeless 0 0 0  PHQ - 2 Score 0 0 0  Altered sleeping 0 0   Tired, decreased energy 0 0   Change in appetite 0 0   Feeling bad or failure about yourself  0 0   Trouble concentrating 0 0   Moving slowly or fidgety/restless 0 0   Suicidal thoughts 0 0   PHQ-9 Score 0 0   Difficult doing work/chores Not difficult at all Not difficult at all     BP Readings from Last 3 Encounters:  12/19/22 134/82  12/05/22 128/78  10/04/22 (!) 178/97    Physical Exam HENT:     Head: Normocephalic.     Right Ear: Tympanic membrane and ear canal normal.     Left Ear: Tympanic membrane normal.     Nose: No congestion or rhinorrhea.  Eyes:     Conjunctiva/sclera: Conjunctivae normal.  Cardiovascular:     Rate and Rhythm: Normal rate.     Heart sounds: No murmur heard.    No gallop.  Pulmonary:     Effort: Pulmonary effort is normal.     Breath sounds: Normal breath sounds. No wheezing, rhonchi or rales.  Abdominal:     Tenderness: There is no abdominal tenderness.  Musculoskeletal:        General: Normal range of motion.     Cervical back: Normal range of motion. No tenderness.  Skin:    General: Skin is warm.  Neurological:     Mental Status: He is alert.     Cranial Nerves: No cranial nerve deficit.     Sensory: No  sensory deficit.      Motor: No weakness.     Coordination: Coordination normal.     Wt Readings from Last 3 Encounters:  12/19/22 240 lb (108.9 kg)  12/05/22 243 lb (110.2 kg)  10/04/22 246 lb (111.6 kg)    BP 134/82   Pulse 62   Temp 97.6 F (36.4 C) (Oral)   Ht '6\' 1"'$  (1.854 m)   Wt 240 lb (108.9 kg)   SpO2 95%   BMI 31.66 kg/m   Assessment and Plan:  1. Fever in other diseases Patient with fever and recently treated for sinusitis but fever resumed about 48 hours ago.  Will recheck for COVID-19.  COVID was noted to be negative. - POC COVID-19  2. Acute cough Acute cough without stridor but decreased expiratory to inspiratory ratio this happens about every year although we skipped a year last year we will initiate prednisone 10 mg once a day along with albuterol 1 to 2 puffs every 6 hours. - predniSONE (DELTASONE) 10 MG tablet; Take 1 tablet (10 mg total) by mouth daily with breakfast.  Dispense: 30 tablet; Refill: 0 - guaiFENesin-codeine (ROBITUSSIN AC) 100-10 MG/5ML syrup; Take 5 mLs by mouth 3 (three) times daily as needed for cough.  Dispense: 118 mL; Refill: 0  3. Bronchitis Because of the persistent nature of the cough despite previous antibiotic.  In addition to the prednisone and albuterol we will use doxycycline 100 mg twice a day and obtain a chest x-ray.  We have discussed whether or not we may involve pulmonary for evaluation. - predniSONE (DELTASONE) 10 MG tablet; Take 1 tablet (10 mg total) by mouth daily with breakfast.  Dispense: 30 tablet; Refill: 0 - DG Chest 2 View - albuterol (VENTOLIN HFA) 108 (90 Base) MCG/ACT inhaler; Inhale 2 puffs into the lungs every 6 (six) hours as needed for wheezing or shortness of breath.  Dispense: 8 g; Refill: 2 - doxycycline (VIBRA-TABS) 100 MG tablet; Take 1 tablet (100 mg total) by mouth 2 (two) times daily.  Dispense: 20 tablet; Refill: 0  4. Mild intermittent reactive airway disease with acute exacerbation As noted above - predniSONE  (DELTASONE) 10 MG tablet; Take 1 tablet (10 mg total) by mouth daily with breakfast.  Dispense: 30 tablet; Refill: 0    Otilio Miu, MD

## 2023-01-08 ENCOUNTER — Other Ambulatory Visit: Payer: Self-pay | Admitting: Family Medicine

## 2023-01-08 DIAGNOSIS — E876 Hypokalemia: Secondary | ICD-10-CM

## 2023-01-10 NOTE — Telephone Encounter (Signed)
Requested Prescriptions  Pending Prescriptions Disp Refills   KLOR-CON M20 20 MEQ tablet [Pharmacy Med Name: Klor-Con M20 20 MEQ Oral Tablet Extended Release] 270 tablet 0    Sig: TAKE 1 TO 2 TABLETS BY MOUTH TWICE DAILY IN THE MORNING AND IN THE EVENING     Endocrinology:  Minerals - Potassium Supplementation Passed - 01/08/2023  6:35 PM      Passed - K in normal range and within 360 days    Potassium  Date Value Ref Range Status  08/22/2022 3.7 3.5 - 5.1 mmol/L Final         Passed - Cr in normal range and within 360 days    Creatinine  Date Value Ref Range Status  12/04/2013 1.31 (H) 0.60 - 1.30 mg/dL Final   Creatinine, Ser  Date Value Ref Range Status  08/22/2022 1.03 0.61 - 1.24 mg/dL Final         Passed - Valid encounter within last 12 months    Recent Outpatient Visits           3 weeks ago [facility] [place], Deanna C, MD   1 month ago Sore throat   [facility] [place], Deanna C, MD   4 months ago Prediabetes   [facility] [place], Deanna C, MD   6 months ago Cellulitis of face   [place] [facility] [place], [place], MD   7 months ago Acute maxillary sinusitis, recurrence not specified   [place] [facility] [place], [place], MD       Future Appointments             In 1 month Juline Patch, MD [place] [facility] [place]   In 9 months [facility] [place], Herbert Seta, MD [facility] [place]

## 2023-01-11 DIAGNOSIS — L905 Scar conditions and fibrosis of skin: Secondary | ICD-10-CM | POA: Diagnosis not present

## 2023-01-11 DIAGNOSIS — C44612 Basal cell carcinoma of skin of right upper limb, including shoulder: Secondary | ICD-10-CM | POA: Diagnosis not present

## 2023-01-13 ENCOUNTER — Encounter: Payer: Self-pay | Admitting: Physician Assistant

## 2023-01-13 ENCOUNTER — Ambulatory Visit: Payer: Medicare PPO | Admitting: Physician Assistant

## 2023-01-13 VITALS — BP 132/86 | HR 71 | Temp 97.9°F | Ht 73.0 in | Wt 245.0 lb

## 2023-01-13 DIAGNOSIS — J069 Acute upper respiratory infection, unspecified: Secondary | ICD-10-CM

## 2023-01-13 LAB — POC COVID19 BINAXNOW: SARS Coronavirus 2 Ag: NEGATIVE

## 2023-01-13 NOTE — Progress Notes (Signed)
Date:  01/13/2023   Name:  [name]   DOB:  Apr 15, 1956   MRN:  [id number]   Chief Complaint: Cough  HPI [name] is a 67 y.o. male new to me today for evaluation of URI symptoms for the past 3 days including nasal congestion, postnasal drip, rhinorrhea, sore throat, mildly productive cough, and slightly elevated temperature of 99.1 F  at home.  He has been using Tylenol, DayQuil, NyQuil for symptomatic relief.  Symptoms are not causing sleep disturbance.  Notes that he was doing some yard work and trimming on Wednesday afternoon/evening and mild symptoms appeared later that night, worse Thursday morning, and worse still today.  Denies chest pain, dyspnea, wheezing, myalgia, NVD.  He says this usually happens every year in the spring and requires antibiotics and steroids.  Chart review shows visits 12/05/22 and 12/19/2022 with similar symptoms having been treated with amoxicillin, doxycycline, and prednisone; CXR negative at that time.  Recent Labs     Component Value Date/Time   NA 141 08/22/2022 1530   NA 144 02/21/2022 0926   K 3.7 08/22/2022 1530   CL 103 08/22/2022 1530   CO2 27 08/22/2022 1530   GLUCOSE 140 (H) 08/22/2022 1530   BUN 13 08/22/2022 1530   BUN 16 02/21/2022 0926   CREATININE 1.03 08/22/2022 1530   CREATININE 1.31 (H) 12/04/2013 1550   CALCIUM 9.3 08/22/2022 1530   PROT 6.8 06/15/2022 0811   ALBUMIN 4.3 06/15/2022 0811   AST 13 06/15/2022 0811   ALT 16 06/15/2022 0811   ALKPHOS 96 06/15/2022 0811   BILITOT 0.4 06/15/2022 0811   GFRNONAA >60 08/22/2022 1530   GFRNONAA 59 (L) 12/04/2013 1550   GFRAA 103 04/06/2020 0909   GFRAA >60 12/04/2013 1550    Lab Results  Component Value Date   WBC 6.0 08/22/2022   HGB 13.9 08/22/2022   HCT 43.2 08/22/2022   MCV 84.0 08/22/2022   PLT 167 08/22/2022   Lab Results  Component Value Date   HGBA1C 6.1 (H) 08/24/2022   Lab Results  Component Value Date   CHOL 128 06/15/2022   HDL 46 06/15/2022   LDLCALC 67  06/15/2022   TRIG 76 06/15/2022   CHOLHDL 4.1 08/07/2019   Lab Results  Component Value Date   TSH 0.569 02/21/2022    Review of Systems  HENT:  Positive for postnasal drip, rhinorrhea and sore throat.   Respiratory:  Positive for cough.     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   Hypokalemia 08/24/2021   Bacteremia due to Gram-negative bacteria    AF (paroxysmal atrial fibrillation) (HCC)    SIRS (systemic inflammatory response syndrome) ([place]) 07/27/2021   Sepsis secondary to UTI ([place]) 07/27/2021   Ureteral stricture 06/18/2021   Sacroiliitis ([place]) 05/07/2021   Spinal stenosis of lumbosacral region 01/18/2021   Herniated nucleus pulposus, lumbar 03/16/2020   Body mass index (BMI) 33.0-33.9, adult 09/03/2019   Other intervertebral disc displacement, lumbar region 06/25/2019   HNP (herniated nucleus pulposus), lumbar 06/12/2019   UTI due to Klebsiella species 05/08/2019   Biceps tendonitis on right 07/27/2018   Spinal stenosis, 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   Lumbago with sciatica, unspecified side 11/14/2017   Primary osteoarthritis of right knee 10/12/2017   Taking multiple medications for chronic disease 07/30/2015   Reactive airway disease 07/01/2015   Allergic sinusitis 07/01/2015   Essential hypertension 07/01/2015   Valvular heart  disease 07/01/2015   Metatarsalgia of left foot 06/10/2015   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    Allergies  Allergen Reactions   Meloxicam Palpitations   Sulfa Antibiotics Rash    Past Surgical History:  Procedure Laterality Date   ABDOMINAL EXPOSURE N/A 01/18/2021   Procedure: ABDOMINAL EXPOSURE;  Surgeon: [name], MD;  Location: [place];  Service: Vascular;  Laterality: N/A;   ANTERIOR LUMBAR FUSION N/A 01/18/2021   Procedure: Anterior Lumbar Interbody Fusion - Lumbar Five-Sarcal One;  Surgeon: Kary Kos, MD;  Location: [place];  Service:  Neurosurgery;  Laterality: N/A;  Anterior Lumbar Interbody Fusion - Lumbar Five-Sacral One   CYSTOSCOPY W/ URETERAL STENT PLACEMENT Left 06/18/2021   Procedure: CYSTOSCOPY WITH RETROGRADE PYELOGRAM/URETERAL STENT EXCHANGE;  Surgeon: Alexis Frock, MD;  Location: [facility];  Service: Urology;  Laterality: Left;   CYSTOSCOPY WITH RETROGRADE PYELOGRAM, URETEROSCOPY AND STENT PLACEMENT Left 04/21/2021   Procedure: CYSTOSCOPY WITH RETROGRADE PYELOGRAM, DIAGNOSTICURETEROSCOPY AND STENT PLACEMENT;  Surgeon: Alexis Frock, MD;  [facility];  Service: Urology;  Laterality: Left;  1 HR   EXTRACORPOREAL SHOCK WAVE LITHOTRIPSY     approx 2002   LUMBAR [place] SURGERY  2018   LUMBAR LAMINECTOMY/DECOMPRESSION MICRODISCECTOMY Right 08/31/2022   Procedure: Redo foraminotomy right Lumbar four-five;  Surgeon: Kary Kos, MD;  Location: [place];  Service: Neurosurgery;  Laterality: Right;   REPAIR DURAL / CSF LEAK  2018   post op lumbar diskectomy   REVISION TOTAL KNEE ARTHROPLASTY Left 2015   TOTAL KNEE ARTHROPLASTY Left 2012   TRANSFORAMINAL LUMBAR INTERBODY FUSION (TLIF) WITH PEDICLE SCREW FIXATION 1 LEVEL N/A 06/12/2019   Procedure: LUMBAR FOUR-FIVE TRANSFORAMINAL LUMBAR INTERBODY FUSION (TLIF) WITH PEDICLE SCREW FIXATION;  Surgeon: Kary Kos, MD;  Location: [place];  Service: Neurosurgery;  Laterality: N/A;   UMBILICAL HERNIA REPAIR  1992    Social History   Tobacco Use   Smoking status: Never   Smokeless tobacco: Former    Types: Chew    Quit date: 03/2020  Vaping Use   Vaping Use: Never used  Substance Use Topics   Alcohol use: Yes    Comment: ocassional   Drug use: Never     Medication list has been reviewed and updated.  Current Meds  Medication Sig   acetaminophen (TYLENOL) 325 MG tablet Take 650 mg by mouth every 6 (six) hours as needed for moderate pain.   albuterol (VENTOLIN HFA) 108 (90 Base) MCG/ACT inhaler Inhale 2 puffs into the lungs every 6 (six) hours as  needed for wheezing or shortness of breath.   apixaban (ELIQUIS) 5 MG TABS tablet Take 1 tablet (5 mg total) by mouth 2 (two) times daily. Restart Wednesday, November 16   celecoxib (CELEBREX) 200 MG capsule Take 200 mg by mouth daily.   cyclobenzaprine (FLEXERIL) 5 MG tablet Take 5 mg by mouth 3 (three) times daily as needed.   diltiazem (CARDIZEM CD) 180 MG 24 hr capsule Take 1 capsule (180 mg total) by mouth 2 (two) times daily.   ferrous sulfate 325 (65 FE) MG tablet Take 325 mg by mouth daily with breakfast.   fexofenadine (ALLEGRA) 180 MG tablet Take 1 tablet (180 mg total) by mouth daily.   fluticasone (FLONASE) 50 MCG/ACT nasal spray Place 2 sprays into both nostrils daily as needed for allergies.   hydrochlorothiazide (HYDRODIURIL) 12.5 MG tablet Take 1 tablet (12.5 mg total) by mouth daily.   KLOR-CON M20 20 MEQ tablet TAKE 1 TO 2 TABLETS BY MOUTH  TWICE DAILY IN THE MORNING AND IN THE EVENING   metoprolol succinate (TOPROL-XL) 25 MG 24 hr tablet Take 1 tablet (25 mg total) by mouth daily.   montelukast (SINGULAIR) 10 MG tablet Take 1 tablet (10 mg total) by mouth at bedtime.   Multiple Vitamins-Minerals (MULTIVITAMIN MEN) TABS Take 1 tablet by mouth daily.   pantoprazole (PROTONIX) 40 MG tablet Take 1 tablet (40 mg total) by mouth every evening.   predniSONE (DELTASONE) 10 MG tablet Take 1 tablet (10 mg total) by mouth daily with breakfast.   rosuvastatin (CRESTOR) 5 MG tablet TAKE 1 TABLET BY MOUTH THREE TIMES A WEEK   tadalafil (CIALIS) 5 MG tablet Take 1-2 tablets (5-10 mg total) by mouth daily as needed for erectile dysfunction.   tamsulosin (FLOMAX) 0.4 MG CAPS capsule Take 1 capsule (0.4 mg total) by mouth daily.   valsartan (DIOVAN) 320 MG tablet Take 1 tablet (320 mg total) by mouth daily.   [DISCONTINUED] doxycycline (VIBRA-TABS) 100 MG tablet Take 1 tablet (100 mg total) by mouth 2 (two) times daily.       01/13/2023    1:16 PM 12/05/2022    4:04 PM 08/24/2022   10:05 AM  06/21/2022   10:59 AM  GAD 7 : Generalized Anxiety Score  Nervous, Anxious, on Edge 0 0 0 0  Control/stop worrying 0 0 0 0  Worry too much - different things 0 0 0 0  Trouble relaxing 0 0 0 0  Restless 0 0 0 0  Easily annoyed or irritable 0 0 0 0  Afraid - awful might happen 0 0 0 0  Total GAD 7 Score 0 0 0 0  Anxiety Difficulty Not difficult at all Not difficult at all Not difficult at all Not difficult at all       01/13/2023    1:16 PM 12/05/2022    4:04 PM 09/21/2022    8:59 AM  Depression screen PHQ 2/9  Decreased Interest 0 0 0  Down, Depressed, Hopeless 0 0 0  PHQ - 2 Score 0 0 0  Altered sleeping 0 0 0  Tired, decreased energy 0 0 0  Change in appetite 0 0 0  Feeling bad or failure about yourself  0 0 0  Trouble concentrating 0 0 0  Moving slowly or fidgety/restless 0 0 0  Suicidal thoughts 0 0 0  PHQ-9 Score 0 0 0  Difficult doing work/chores Not difficult at all Not difficult at all Not difficult at all    BP Readings from Last 3 Encounters:  01/13/23 132/86  12/19/22 134/82  12/05/22 128/78    Physical Exam Vitals and nursing note reviewed.  Constitutional:      General: He is not in acute distress.    Appearance: Normal appearance.  HENT:     Right Ear: Tympanic membrane normal.     Left Ear: Tympanic membrane normal.     Nose: Nose normal.     Right Sinus: No maxillary sinus tenderness or frontal sinus tenderness.     Left Sinus: No maxillary sinus tenderness or frontal sinus tenderness.     Mouth/Throat:     Pharynx: Posterior oropharyngeal erythema present. No oropharyngeal exudate.  Eyes:     Conjunctiva/sclera: Conjunctivae normal.  Cardiovascular:     Rate and Rhythm: Normal rate and regular rhythm.     Heart sounds: Murmur heard.     Systolic murmur is present with a grade of 1/6.     No friction rub.  No gallop.     Comments: Subtle systolic murmur today without radiation to carotids Pulmonary:     Effort: Pulmonary effort is normal.      Breath sounds: Normal breath sounds.  Lymphadenopathy:     Cervical: Cervical adenopathy present.     Right cervical: No superficial cervical adenopathy.    Left cervical: Superficial cervical adenopathy present.     Wt Readings from Last 3 Encounters:  01/13/23 245 lb (111.1 kg)  12/19/22 240 lb (108.9 kg)  12/05/22 243 lb (110.2 kg)    BP 132/86 (BP Location: Left Arm, Patient Position: Sitting, Cuff Size: Large)   Pulse 71   Temp 97.9 F (36.6 C) (Oral)   Ht 6\' 1"  (1.854 m)   Wt 245 lb (111.1 kg)   SpO2 95%   BMI 32.32 kg/m   Assessment and Plan:  1. Acute URI COVID neg today. Discussed with patient his exam is quite reassuring. No need for antibiotic/steroid at this time, especially since he has already received prednisone and two courses of antibiotics last month. Timing is suspicious for allergies since he was doing yard work, but consider also viral illness. Patient not optimistic about his ability to recover without pharmacotherapy. Advised he call us next week for an update.  Offered cough syrup but declined.   Likely viral etiology. Discussed self-limited nature of viral illnesses and advised conservative measures including rest, fluids, and OTC cough/cold medications. Contact precautions advised to limit spread. Encouraged mask wearing and good hand hygiene especially before meals. Call if acutely worsening symptoms or if no improvement in 5 days  - POC COVID-19   Return if symptoms worsen or fail to improve.   Partially dictated using Editor, commissioning. Any errors are unintentional.  Lupita Leash, PA-C, [facility]

## 2023-01-13 NOTE — Patient Instructions (Signed)
-  I believe your symptoms are most likely an allergy flare or viral illness -No need for antibiotic or steroid at this time, but please let me know if things are not improving at all by early next week -In the meantime, focus on symptom management as discussed

## 2023-01-20 ENCOUNTER — Encounter: Payer: Self-pay | Admitting: Family Medicine

## 2023-01-20 ENCOUNTER — Ambulatory Visit: Payer: Medicare PPO | Admitting: Family Medicine

## 2023-01-20 VITALS — BP 132/60 | HR 65 | Ht 73.0 in | Wt 245.0 lb

## 2023-01-20 DIAGNOSIS — J0100 Acute maxillary sinusitis, unspecified: Secondary | ICD-10-CM | POA: Diagnosis not present

## 2023-01-20 DIAGNOSIS — R051 Acute cough: Secondary | ICD-10-CM | POA: Diagnosis not present

## 2023-01-20 MED ORDER — AZITHROMYCIN 250 MG PO TABS: ORAL_TABLET | ORAL | 0 refills | Status: AC

## 2023-01-20 MED ORDER — PREDNISONE 10 MG PO TABS: 10.0000 mg | ORAL_TABLET | Freq: Every day | ORAL | 0 refills | Status: DC

## 2023-01-20 MED ORDER — GUAIFENESIN-CODEINE 100-10 MG/5ML PO SYRP: 5.0000 mL | ORAL_SOLUTION | Freq: Three times a day (TID) | ORAL | 0 refills | Status: DC | PRN

## 2023-01-20 NOTE — Progress Notes (Signed)
Date:  01/20/2023   Name:  [name]   DOB:  1956/09/30   MRN:  [id number]   Chief Complaint: Cough (Started first of week- got worse yesterday- green production)  Cough This is a recurrent problem. The current episode started in the past 7 days. The problem has been gradually improving. The problem occurs every few minutes. The cough is Productive of purulent sputum (wednesday). Associated symptoms include postnasal drip and a sore throat. Pertinent negatives include no chest pain, chills, ear congestion, ear pain, fever, headaches, hemoptysis, myalgias, nasal congestion, rhinorrhea, shortness of breath or wheezing. He has tried a beta-agonist inhaler for the symptoms. The treatment provided mild relief. His past medical history is significant for asthma and environmental allergies.    Lab Results  Component Value Date   NA 141 08/22/2022   K 3.7 08/22/2022   CO2 27 08/22/2022   GLUCOSE 140 (H) 08/22/2022   BUN 13 08/22/2022   CREATININE 1.03 08/22/2022   CALCIUM 9.3 08/22/2022   EGFR 85 02/21/2022   GFRNONAA >60 08/22/2022   Lab Results  Component Value Date   CHOL 128 06/15/2022   HDL 46 06/15/2022   LDLCALC 67 06/15/2022   TRIG 76 06/15/2022   CHOLHDL 4.1 08/07/2019   Lab Results  Component Value Date   TSH 0.569 02/21/2022   Lab Results  Component Value Date   HGBA1C 6.1 (H) 08/24/2022   Lab Results  Component Value Date   WBC 6.0 08/22/2022   HGB 13.9 08/22/2022   HCT 43.2 08/22/2022   MCV 84.0 08/22/2022   PLT 167 08/22/2022   Lab Results  Component Value Date   ALT 16 06/15/2022   AST 13 06/15/2022   ALKPHOS 96 06/15/2022   BILITOT 0.4 06/15/2022   No results found for: "25OHVITD2", "25OHVITD3", "VD25OH"   Review of Systems  Constitutional:  Negative for chills and fever.  HENT:  Positive for postnasal drip and sore throat. Negative for ear pain, rhinorrhea, sinus pressure and sneezing.   Respiratory:  Positive for cough. Negative for  hemoptysis, shortness of breath, wheezing and stridor.   Cardiovascular:  Negative for chest pain, palpitations and leg swelling.  Gastrointestinal:  Negative for abdominal distention.  Musculoskeletal:  Negative for myalgias.  Allergic/Immunologic: Positive for environmental allergies.  Neurological:  Negative for headaches.    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   Hypokalemia 08/24/2021   Bacteremia due to Gram-negative bacteria    AF (paroxysmal atrial fibrillation) (HCC)    SIRS (systemic inflammatory response syndrome) ([place]) 07/27/2021   Sepsis secondary to UTI ([place]) 07/27/2021   Ureteral stricture 06/18/2021   Sacroiliitis ([place]) 05/07/2021   Spinal stenosis of lumbosacral region 01/18/2021   Herniated nucleus pulposus, lumbar 03/16/2020   Body mass index (BMI) 33.0-33.9, adult 09/03/2019   Other intervertebral disc displacement, lumbar region 06/25/2019   HNP (herniated nucleus pulposus), lumbar 06/12/2019   UTI due to Klebsiella species 05/08/2019   Biceps tendonitis on right 07/27/2018   Spinal stenosis, 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   Lumbago with sciatica, unspecified side 11/14/2017   Primary osteoarthritis of right knee 10/12/2017   Taking multiple medications for chronic disease 07/30/2015   Reactive airway disease 07/01/2015   Allergic sinusitis 07/01/2015   Essential hypertension 07/01/2015   Valvular heart disease 07/01/2015   Metatarsalgia of left foot 06/10/2015   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    Allergies  Allergen Reactions  Meloxicam Palpitations   Sulfa Antibiotics Rash    Past Surgical History:  Procedure Laterality Date   ABDOMINAL EXPOSURE N/A 01/18/2021   Procedure: ABDOMINAL EXPOSURE;  Surgeon: [name], MD;  Location: [place];  Service: Vascular;  Laterality: N/A;   ANTERIOR LUMBAR FUSION N/A 01/18/2021   Procedure: Anterior Lumbar Interbody Fusion -  Lumbar Five-Sarcal One;  Surgeon: Kary Kos, MD;  Location: [place];  Service: Neurosurgery;  Laterality: N/A;  Anterior Lumbar Interbody Fusion - Lumbar Five-Sacral One   CYSTOSCOPY W/ URETERAL STENT PLACEMENT Left 06/18/2021   Procedure: CYSTOSCOPY WITH RETROGRADE PYELOGRAM/URETERAL STENT EXCHANGE;  Surgeon: Alexis Frock, MD;  Location: [facility];  Service: Urology;  Laterality: Left;   CYSTOSCOPY WITH RETROGRADE PYELOGRAM, URETEROSCOPY AND STENT PLACEMENT Left 04/21/2021   Procedure: CYSTOSCOPY WITH RETROGRADE PYELOGRAM, DIAGNOSTICURETEROSCOPY AND STENT PLACEMENT;  Surgeon: Alexis Frock, MD;  Location: [facility];  Service: Urology;  Laterality: Left;  1 HR   EXTRACORPOREAL SHOCK WAVE LITHOTRIPSY     approx 2002   LUMBAR [place] SURGERY  2018   LUMBAR LAMINECTOMY/DECOMPRESSION MICRODISCECTOMY Right 08/31/2022   Procedure: Redo foraminotomy right Lumbar four-five;  Surgeon: Kary Kos, MD;  Location: [place];  Service: Neurosurgery;  Laterality: Right;   REPAIR DURAL / CSF LEAK  2018   post op lumbar diskectomy   REVISION TOTAL KNEE ARTHROPLASTY Left 2015   TOTAL KNEE ARTHROPLASTY Left 2012   TRANSFORAMINAL LUMBAR INTERBODY FUSION (TLIF) WITH PEDICLE SCREW FIXATION 1 LEVEL N/A 06/12/2019   Procedure: LUMBAR FOUR-FIVE TRANSFORAMINAL LUMBAR INTERBODY FUSION (TLIF) WITH PEDICLE SCREW FIXATION;  Surgeon: Kary Kos, MD;  Location: [place];  Service: Neurosurgery;  Laterality: N/A;   UMBILICAL HERNIA REPAIR  1992    Social History   Tobacco Use   Smoking status: Never   Smokeless tobacco: Former    Types: Chew    Quit date: 03/2020  Vaping Use   Vaping Use: Never used  Substance Use Topics   Alcohol use: Yes    Comment: ocassional   Drug use: Never     Medication list has been reviewed and updated.  Current Meds  Medication Sig   acetaminophen (TYLENOL) 325 MG tablet Take 650 mg by mouth every 6 (six) hours as needed for moderate pain.   albuterol (VENTOLIN HFA) 108 (90  Base) MCG/ACT inhaler Inhale 2 puffs into the lungs every 6 (six) hours as needed for wheezing or shortness of breath.   apixaban (ELIQUIS) 5 MG TABS tablet Take 1 tablet (5 mg total) by mouth 2 (two) times daily. Restart Wednesday, November 16   celecoxib (CELEBREX) 200 MG capsule Take 200 mg by mouth daily.   cyclobenzaprine (FLEXERIL) 5 MG tablet Take 5 mg by mouth 3 (three) times daily as needed.   diltiazem (CARDIZEM CD) 180 MG 24 hr capsule Take 1 capsule (180 mg total) by mouth 2 (two) times daily.   ferrous sulfate 325 (65 FE) MG tablet Take 325 mg by mouth daily with breakfast.   fexofenadine (ALLEGRA) 180 MG tablet Take 1 tablet (180 mg total) by mouth daily.   fluticasone (FLONASE) 50 MCG/ACT nasal spray Place 2 sprays into both nostrils daily as needed for allergies.   hydrochlorothiazide (HYDRODIURIL) 12.5 MG tablet Take 1 tablet (12.5 mg total) by mouth daily.   KLOR-CON M20 20 MEQ tablet TAKE 1 TO 2 TABLETS BY MOUTH TWICE DAILY IN THE MORNING AND IN THE EVENING   metoprolol succinate (TOPROL-XL) 25 MG 24 hr tablet Take 1 tablet (25  mg total) by mouth daily.   montelukast (SINGULAIR) 10 MG tablet Take 1 tablet (10 mg total) by mouth at bedtime.   Multiple Vitamins-Minerals (MULTIVITAMIN MEN) TABS Take 1 tablet by mouth daily.   pantoprazole (PROTONIX) 40 MG tablet Take 1 tablet (40 mg total) by mouth every evening.   rosuvastatin (CRESTOR) 5 MG tablet TAKE 1 TABLET BY MOUTH THREE TIMES A WEEK   tadalafil (CIALIS) 5 MG tablet Take 1-2 tablets (5-10 mg total) by mouth daily as needed for erectile dysfunction.   tamsulosin (FLOMAX) 0.4 MG CAPS capsule Take 1 capsule (0.4 mg total) by mouth daily.   valsartan (DIOVAN) 320 MG tablet Take 1 tablet (320 mg total) by mouth daily.       01/20/2023    3:02 PM 01/13/2023    1:16 PM 12/05/2022    4:04 PM 08/24/2022   10:05 AM  GAD 7 : Generalized Anxiety Score  Nervous, Anxious, on Edge 0 0 0 0  Control/stop worrying 0 0 0 0  Worry too  much - different things 0 0 0 0  Trouble relaxing 0 0 0 0  Restless 0 0 0 0  Easily annoyed or irritable 0 0 0 0  Afraid - awful might happen 0 0 0 0  Total GAD 7 Score 0 0 0 0  Anxiety Difficulty Not difficult at all Not difficult at all Not difficult at all Not difficult at all       01/20/2023    3:02 PM 01/13/2023    1:16 PM 12/05/2022    4:04 PM  Depression screen PHQ 2/9  Decreased Interest 0 0 0  Down, Depressed, Hopeless 0 0 0  PHQ - 2 Score 0 0 0  Altered sleeping 0 0 0  Tired, decreased energy 0 0 0  Change in appetite 0 0 0  Feeling bad or failure about yourself  0 0 0  Trouble concentrating 0 0 0  Moving slowly or fidgety/restless 0 0 0  Suicidal thoughts 0 0 0  PHQ-9 Score 0 0 0  Difficult doing work/chores Not difficult at all Not difficult at all Not difficult at all    BP Readings from Last 3 Encounters:  01/20/23 132/60  01/13/23 132/86  12/19/22 134/82    Physical Exam Vitals and nursing note reviewed.  HENT:     Head: Normocephalic.     Right Ear: Tympanic membrane and external ear normal.     Left Ear: Tympanic membrane and external ear normal.     Nose: Nose normal.     Mouth/Throat:     Mouth: Mucous membranes are moist.     Pharynx: No oropharyngeal exudate or posterior oropharyngeal erythema.  Eyes:     General: No scleral icterus.       Right eye: No discharge.        Left eye: No discharge.     Conjunctiva/sclera: Conjunctivae normal.     Pupils: Pupils are equal, round, and reactive to light.  Neck:     Thyroid: No thyromegaly.     Vascular: No JVD.     Trachea: No tracheal deviation.  Cardiovascular:     Rate and Rhythm: Normal rate and regular rhythm.     Heart sounds: Normal heart sounds. No murmur heard.    No friction rub. No gallop.  Pulmonary:     Effort: No respiratory distress.     Breath sounds: Normal breath sounds. No wheezing, rhonchi or rales.  Chest:  Chest wall: No tenderness.  Abdominal:     General: Bowel  sounds are normal.     Palpations: Abdomen is soft. There is no mass.     Tenderness: There is no abdominal tenderness. There is no guarding or rebound.  Musculoskeletal:        General: No tenderness. Normal range of motion.     Cervical back: Normal range of motion and neck supple.  Lymphadenopathy:     Cervical: No cervical adenopathy.  Skin:    General: Skin is warm.     Findings: No rash.  Neurological:     Mental Status: He is alert and oriented to person, place, and time.     Cranial Nerves: No cranial nerve deficit.     Deep Tendon Reflexes: Reflexes are normal and symmetric.     Wt Readings from Last 3 Encounters:  01/20/23 245 lb (111.1 kg)  01/13/23 245 lb (111.1 kg)  12/19/22 240 lb (108.9 kg)    BP 132/60 (BP Location: Right Arm, Cuff Size: Large)   Pulse 65   Ht 6\' 1"  (1.854 m)   Wt 245 lb (111.1 kg)   SpO2 94%   BMI 32.32 kg/m   Assessment and Plan:  1. Acute maxillary sinusitis, recurrence not specified New onset.  Persistent.  Stable.  With cough.  Will initiate azithromycin to 50 mg 2 today followed by 1 a day for 4 days. - azithromycin (ZITHROMAX) 250 MG tablet; Take 2 tablets on day 1, then 1 tablet daily on days 2 through 5  Dispense: 6 tablet; Refill: 0  2. Acute cough Acute cough we will counter with Robitussin AC a teaspoon every 6 hours and prednisone 10 mg once a day. - guaiFENesin-codeine (ROBITUSSIN AC) 100-10 MG/5ML syrup; Take 5 mLs by mouth 3 (three) times daily as needed for cough.  Dispense: 118 mL; Refill: 0 - predniSONE (DELTASONE) 10 MG tablet; Take 1 tablet (10 mg total) by mouth daily with breakfast.  Dispense: 20 tablet; Refill: 0    Otilio Miu, MD

## 2023-02-19 ENCOUNTER — Ambulatory Visit
Admission: RE | Admit: 2023-02-19 | Discharge: 2023-02-19 | Disposition: A | Discharge status: Home / Self Care | Payer: Medicare PPO | Source: Ambulatory Visit | Attending: Nurse Practitioner | Admitting: Nurse Practitioner

## 2023-02-19 VITALS — BP 150/89 | HR 76 | Temp 98.2°F | Resp 15 | Ht 73.0 in | Wt 240.0 lb

## 2023-02-19 DIAGNOSIS — L02211 Cutaneous abscess of abdominal wall: Secondary | ICD-10-CM | POA: Diagnosis not present

## 2023-02-19 MED ORDER — DOXYCYCLINE HYCLATE 100 MG PO CAPS: 100.0000 mg | ORAL_CAPSULE | Freq: Two times a day (BID) | ORAL | 0 refills | Status: DC

## 2023-02-19 NOTE — Discharge Instructions (Signed)
Start doxycycline twice daily for 7 days.  Presents medication makes you more sensitive to sunlight and will burn easily.  Please use extra protection such as sunscreen and clothing if you can be on the sun Warm compresses to the area as needed Follow-up with your PCP if your symptoms do not improve Please go to the ER for any worsening symptoms

## 2023-02-19 NOTE — ED Provider Notes (Addendum)
[facility]    CSN: [id number] Arrival date & time: 02/19/23  1106      History   Chief Complaint Chief Complaint  Patient presents with   Abscess    HPI [name] is a 67 y.o. male presents for evaluation of possible abscess.  Patient reports yesterday he noticed a painful bump to his right lower abdomen.  He put some Neosporin on it and came in for some evaluation.  He denies any injury to the area.  No drainage, fevers, chills.  No history of MRSA.  He does take Eliquis.  No history of abscesses in the past.  No other concerns at this time.   Abscess   Past Medical History:  Diagnosis Date   Acquired deafness, right    per pt completed deafness right ear since age 72   Allergic rhinitis, seasonal    Anticoagulant long-term use    eliquis--- managed by cardiology   BPH associated with nocturia    urologist--- dr Berneice Heinrich   Cancer [employer])    basal and squamous cell   Chronic low back pain    DDD (degenerative disc disease), lumbosacral    Foraminal stenosis of lumbosacral region    GERD (gastroesophageal reflux disease)    Heart murmur    History of kidney stones    History of skin cancer    Hydronephrosis, left    Hyperlipidemia, mixed    Hypertension, essential    followed by pcp and cardiology   Hypokalemia    OA (osteoarthritis)    Paroxysmal atrial fibrillation (HCC) 01/28/2017   cardiologist--- dr Gwen Pounds;   pt had ETT 03-16-2017 per cardio note normal, event monitor 02-09-2017 showed baseline SR some peroids  Afib with controlled rate with occ PAC/ PVC;  per pt had echo many yrs ago prior to AFib    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   Hypokalemia 08/24/2021   Bacteremia due to Gram-negative bacteria    AF (paroxysmal atrial fibrillation) (HCC)    SIRS (systemic inflammatory response syndrome) (HCC) 07/27/2021   Sepsis secondary to UTI (HCC) 07/27/2021   Ureteral stricture  06/18/2021   Sacroiliitis (HCC) 05/07/2021   Spinal stenosis of lumbosacral region 01/18/2021   Herniated nucleus pulposus, lumbar 03/16/2020   Body mass index (BMI) 33.0-33.9, adult 09/03/2019   Other intervertebral disc displacement, lumbar region 06/25/2019   HNP (herniated nucleus pulposus), lumbar 06/12/2019   UTI due to Klebsiella species 05/08/2019   Biceps tendonitis on right 07/27/2018   Spinal stenosis, 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   Lumbago with sciatica, unspecified side 11/14/2017   Primary osteoarthritis of right knee 10/12/2017   Taking multiple medications for chronic disease 07/30/2015   Reactive airway disease 07/01/2015   Allergic sinusitis 07/01/2015   Essential hypertension 07/01/2015   Valvular heart disease 07/01/2015   Metatarsalgia of left foot 06/10/2015   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    Past Surgical History:  Procedure Laterality Date   ABDOMINAL EXPOSURE N/A 01/18/2021   Procedure: ABDOMINAL EXPOSURE;  Surgeon: Cephus Shelling, MD;  Location[facility] OR;  Service: Vascular;  Laterality: N/A;   ANTERIOR LUMBAR FUSION N/A 01/18/2021   Procedure: Anterior Lumbar Interbody Fusion - Lumbar Five-Sarcal One;  Surgeon: Donalee Citrin, MD;  Location: [facility] OR;  Service: Neurosurgery;  Laterality: N/A;  Anterior Lumbar Interbody Fusion - Lumbar Five-Sacral One   CYSTOSCOPY W/ URETERAL STENT PLACEMENT Left 06/18/2021   Procedure: CYSTOSCOPY  WITH RETROGRADE PYELOGRAM/URETERAL STENT EXCHANGE;  Surgeon: Sebastian Ache, MD;  Location: [facility] ORS;  Service: Urology;  Laterality: Left;   CYSTOSCOPY WITH RETROGRADE PYELOGRAM, URETEROSCOPY AND STENT PLACEMENT Left 04/21/2021   Procedure: CYSTOSCOPY WITH RETROGRADE PYELOGRAM, DIAGNOSTICURETEROSCOPY AND STENT PLACEMENT;  Surgeon: Sebastian Ache, MD;  Location: [facility];  Service: Urology;  Laterality: Left;  1 HR   EXTRACORPOREAL SHOCK WAVE LITHOTRIPSY     approx 2002   LUMBAR DISC SURGERY  2018    LUMBAR LAMINECTOMY/DECOMPRESSION MICRODISCECTOMY Right 08/31/2022   Procedure: Redo foraminotomy right Lumbar four-five;  Surgeon: Donalee Citrin, MD;  Location: [facility] OR;  Service: Neurosurgery;  Laterality: Right;   REPAIR DURAL / CSF LEAK  2018   post op lumbar diskectomy   REVISION TOTAL KNEE ARTHROPLASTY Left 2015   TOTAL KNEE ARTHROPLASTY Left 2012   TRANSFORAMINAL LUMBAR INTERBODY FUSION (TLIF) WITH PEDICLE SCREW FIXATION 1 LEVEL N/A 06/12/2019   Procedure: LUMBAR FOUR-FIVE TRANSFORAMINAL LUMBAR INTERBODY FUSION (TLIF) WITH PEDICLE SCREW FIXATION;  Surgeon: Donalee Citrin, MD;  Location: [facility] OR;  Service: Neurosurgery;  Laterality: N/A;   UMBILICAL HERNIA REPAIR  1992       Home Medications    Prior to Admission medications   Medication Sig Start Date End Date Taking? Authorizing Provider  doxycycline (VIBRAMYCIN) 100 MG capsule Take 1 capsule (100 mg total) by mouth 2 (two) times daily for 7 days. 02/19/23 02/26/23 Yes Radford Pax, NP  acetaminophen (TYLENOL) 325 MG tablet Take 650 mg by mouth every 6 (six) hours as needed for moderate pain.    [provider]  albuterol (VENTOLIN HFA) 108 (90 Base) MCG/ACT inhaler Inhale 2 puffs into the lungs every 6 (six) hours as needed for wheezing or shortness of breath. 12/19/22   Duanne Limerick, MD  apixaban (ELIQUIS) 5 MG TABS tablet Take 1 tablet (5 mg total) by mouth 2 (two) times daily. Restart Wednesday, November 16 09/01/22   Donalee Citrin, MD  celecoxib (CELEBREX) 200 MG capsule Take 200 mg by mouth daily.    [provider]  cyclobenzaprine (FLEXERIL) 5 MG tablet Take 5 mg by mouth 3 (three) times daily as needed. 10/03/22   [provider]  diltiazem (CARDIZEM CD) 180 MG 24 hr capsule Take 1 capsule (180 mg total) by mouth 2 (two) times daily. 08/24/22   Duanne Limerick, MD  ferrous sulfate 325 (65 FE) MG tablet Take 325 mg by mouth daily with breakfast.    [provider]  fexofenadine (ALLEGRA) 180 MG tablet Take  1 tablet (180 mg total) by mouth daily. 08/24/22   Duanne Limerick, MD  fluticasone (FLONASE) 50 MCG/ACT nasal spray Place 2 sprays into both nostrils daily as needed for allergies. 10/06/22   Duanne Limerick, MD  guaiFENesin-codeine (ROBITUSSIN AC) 100-10 MG/5ML syrup Take 5 mLs by mouth 3 (three) times daily as needed for cough. 01/20/23   Duanne Limerick, MD  hydrochlorothiazide (HYDRODIURIL) 12.5 MG tablet Take 1 tablet (12.5 mg total) by mouth daily. 08/24/22   Duanne Limerick, MD  KLOR-CON M20 20 MEQ tablet TAKE 1 TO 2 TABLETS BY MOUTH TWICE DAILY IN THE MORNING AND IN THE EVENING 01/10/23   Duanne Limerick, MD  metoprolol succinate (TOPROL-XL) 25 MG 24 hr tablet Take 1 tablet (25 mg total) by mouth daily. 08/24/22   Duanne Limerick, MD  montelukast (SINGULAIR) 10 MG tablet Take 1 tablet (10 mg total) by mouth at bedtime. 08/24/22   Elizabeth Sauer  C, MD  Multiple Vitamins-Minerals (MULTIVITAMIN MEN) TABS Take 1 tablet by mouth daily.    [provider]  pantoprazole (PROTONIX) 40 MG tablet Take 1 tablet (40 mg total) by mouth every evening. 08/24/22   Duanne Limerick, MD  predniSONE (DELTASONE) 10 MG tablet Take 1 tablet (10 mg total) by mouth daily with breakfast. 01/20/23   Duanne Limerick, MD  rosuvastatin (CRESTOR) 5 MG tablet TAKE 1 TABLET BY MOUTH THREE TIMES A WEEK 08/24/22   Duanne Limerick, MD  tadalafil (CIALIS) 5 MG tablet Take 1-2 tablets (5-10 mg total) by mouth daily as needed for erectile dysfunction. 10/04/22   Sondra Come, MD  tamsulosin (FLOMAX) 0.4 MG CAPS capsule Take 1 capsule (0.4 mg total) by mouth daily. 10/04/22   Sondra Come, MD  valsartan (DIOVAN) 320 MG tablet Take 1 tablet (320 mg total) by mouth daily. 08/24/22   Duanne Limerick, MD    Family History Family History  Problem Relation Age of Onset   Cancer Mother    Cancer Father    Cancer Brother    Hyperlipidemia Brother     Social History Social History   Tobacco Use   Smoking status: Never    Smokeless tobacco: Former    Types: Chew    Quit date: 03/2020  Vaping Use   Vaping Use: Never used  Substance Use Topics   Alcohol use: Yes    Comment: ocassional   Drug use: Never     Allergies   Meloxicam and Sulfa antibiotics   Review of Systems Review of Systems  Skin:  Positive for wound.     Physical Exam Triage Vital Signs ED Triage Vitals  Enc Vitals Group     BP 02/19/23 1118 (!) 150/89     Pulse Rate 02/19/23 1118 76     Resp 02/19/23 1118 15     Temp 02/19/23 1118 98.2 F (36.8 C)     Temp Source 02/19/23 1118 Oral     SpO2 02/19/23 1118 95 %     Weight 02/19/23 1117 240 lb (108.9 kg)     Height 02/19/23 1117 6\' 1"  (1.854 m)     Head Circumference --      Peak Flow --      Pain Score 02/19/23 1116 5     Pain Loc --      Pain Edu? --      Excl. in GC? --    No data found.  Updated Vital Signs BP (!) 150/89 (BP Location: Left Arm)   Pulse 76   Temp 98.2 F (36.8 C) (Oral)   Resp 15   Ht 6\' 1"  (1.854 m)   Wt 240 lb (108.9 kg)   SpO2 95%   BMI 31.66 kg/m   Visual Acuity Right Eye Distance:   Left Eye Distance:   Bilateral Distance:    Right Eye Near:   Left Eye Near:    Bilateral Near:     Physical Exam Vitals and nursing note reviewed.  Constitutional:      General: He is not in acute distress.    Appearance: Normal appearance. He is not ill-appearing.  HENT:     Head: Normocephalic and atraumatic.  Eyes:     Pupils: Pupils are equal, round, and reactive to light.  Cardiovascular:     Rate and Rhythm: Normal rate.  Pulmonary:     Effort: Pulmonary effort is normal.  Abdominal:    Skin:  General: Skin is warm and dry.  Neurological:     General: No focal deficit present.     Mental Status: He is alert and oriented to person, place, and time.  Psychiatric:        Mood and Affect: Mood normal.        Behavior: Behavior normal.      UC Treatments / Results  Labs (all labs ordered are listed, but only abnormal  results are displayed) Labs Reviewed - No data to display  ntains abnormal data Basic metabolic panel per protocol Order: [id number] Status: Final result     Visible to patient: Yes (seen)     Next appt: 03/01/2023 at 10:40 AM in Internal Medicine Elizabeth Sauer, MD)     Dx: Hypertension, unspecified type   0 Result Notes          Component Ref Range & Units 6 mo ago (08/22/22) 12 mo ago (02/21/22) 1 yr ago (08/30/21) 1 yr ago (08/24/21) 1 yr ago (08/02/21) 1 yr ago (08/01/21) 1 yr ago (07/31/21)  Sodium 135 - 145 mmol/L 141 144 R 143 R 143 R 138 138 134 Low   Potassium 3.5 - 5.1 mmol/L 3.7 3.9 R 4.0 R 3.8 R 3.3 Low  3.1 Low  2.9 Low   Chloride 98 - 111 mmol/L 103 104 R 102 R 104 R 106 105 99  CO2 22 - 32 mmol/L 27 24 R 20 R 24 R 24 26 27   Glucose, Bld 70 - 99 mg/dL 865 High  784 High  96 126 High  121 High  CM 131 High  CM 136 High  CM  Comment: Glucose reference range applies only to samples taken after fasting for at least 8 hours.  BUN 8 - 23 mg/dL 13 16 R 12 R 12 R 17 19 15   Creatinine, Ser 0.61 - 1.24 mg/dL 6.96 2.95 R 2.84 R 1.32 R 0.95 0.95 0.98  Calcium 8.9 - 10.3 mg/dL 9.3 9.3 R 9.4 R 9.0 R 8.2 Low  8.2 Low  8.5 Low   GFR, Estimated >60 mL/min >60    >60 CM >60 CM >60 CM  Comment: (NOTE) Calculated using the CKD-EPI Creatinine Equation (2021)  Anion gap 5 - 15 11            EKG   Radiology No results found.  Procedures Procedures (including critical care time)  Medications Ordered in UC Medications - No data to display  Initial Impression / Assessment and Plan / UC Course  I have reviewed the triage vital signs and the nursing notes.  Pertinent labs & imaging results that were available during my care of the patient were reviewed by me and considered in my medical decision making (see chart for details).    Reviewed recent progress notes and labs Reviewed exam and symptoms with patient.  Will start doxycycline.  This does not interact with his  Eliquis.  Side effect profile reviewed I&D not indicated, encouraged warm compresses PCP follow-up if symptoms do not improve ER precautions reviewed and patient verbalized understanding Final Clinical Impressions(s) / UC Diagnoses   Final diagnoses:  Cutaneous abscess of abdominal wall     Discharge Instructions      Start doxycycline twice daily for 7 days.  Presents medication makes you more sensitive to sunlight and will burn easily.  Please use extra protection such as sunscreen and clothing if you can be on the sun Warm compresses to the area as needed Follow-up with  your PCP if your symptoms do not improve Please go to the ER for any worsening symptoms   ED Prescriptions     Medication Sig Dispense Auth. Provider   doxycycline (VIBRAMYCIN) 100 MG capsule Take 1 capsule (100 mg total) by mouth 2 (two) times daily for 7 days. 14 capsule Radford Pax, NP      PDMP not reviewed this encounter.   Radford Pax, NP 02/19/23 1151    Radford Pax, NP 02/19/23 [phone]

## 2023-02-19 NOTE — ED Triage Notes (Signed)
Patient reports swollen bump on the right lower abdomen that started 2 days ago.  Patient any fevers.

## 2023-02-22 ENCOUNTER — Ambulatory Visit: Payer: Medicare PPO | Admitting: Family Medicine

## 2023-02-23 ENCOUNTER — Encounter: Payer: Self-pay | Admitting: Family Medicine

## 2023-02-23 ENCOUNTER — Ambulatory Visit: Payer: Medicare PPO | Admitting: Family Medicine

## 2023-02-23 ENCOUNTER — Other Ambulatory Visit: Payer: Self-pay | Admitting: Family Medicine

## 2023-02-23 ENCOUNTER — Other Ambulatory Visit
Admission: RE | Admit: 2023-02-23 | Discharge: 2023-02-23 | Disposition: A | Discharge status: Home / Self Care | Payer: Medicare PPO | Attending: Family Medicine | Admitting: Family Medicine

## 2023-02-23 VITALS — BP 124/76 | HR 48 | Ht 73.0 in | Wt 244.0 lb

## 2023-02-23 DIAGNOSIS — R059 Cough, unspecified: Secondary | ICD-10-CM | POA: Diagnosis not present

## 2023-02-23 DIAGNOSIS — J4521 Mild intermittent asthma with (acute) exacerbation: Secondary | ICD-10-CM

## 2023-02-23 DIAGNOSIS — J40 Bronchitis, not specified as acute or chronic: Secondary | ICD-10-CM | POA: Diagnosis not present

## 2023-02-23 DIAGNOSIS — J302 Other seasonal allergic rhinitis: Secondary | ICD-10-CM

## 2023-02-23 DIAGNOSIS — R509 Fever, unspecified: Secondary | ICD-10-CM | POA: Insufficient documentation

## 2023-02-23 DIAGNOSIS — R051 Acute cough: Secondary | ICD-10-CM

## 2023-02-23 LAB — CBC WITH DIFFERENTIAL/PLATELET
Abs Immature Granulocytes: 0.02 10*3/uL (ref 0.00–0.07)
Basophils Absolute: 0 10*3/uL (ref 0.0–0.1)
Basophils Relative: 0 %
Eosinophils Absolute: 0 10*3/uL (ref 0.0–0.5)
Eosinophils Relative: 1 %
HCT: 44.7 % (ref 39.0–52.0)
Hemoglobin: 15.2 g/dL (ref 13.0–17.0)
Immature Granulocytes: 0 %
Lymphocytes Relative: 19 %
Lymphs Abs: 1 10*3/uL (ref 0.7–4.0)
MCH: 28.4 pg (ref 26.0–34.0)
MCHC: 34 g/dL (ref 30.0–36.0)
MCV: 83.6 fL (ref 80.0–100.0)
Monocytes Absolute: 0.6 10*3/uL (ref 0.1–1.0)
Monocytes Relative: 12 %
Neutro Abs: 3.4 10*3/uL (ref 1.7–7.7)
Neutrophils Relative %: 68 %
Platelets: 197 10*3/uL (ref 150–400)
RBC: 5.35 MIL/uL (ref 4.22–5.81)
RDW: 15.3 % (ref 11.5–15.5)
WBC: 5 10*3/uL (ref 4.0–10.5)
nRBC: 0 % (ref 0.0–0.2)

## 2023-02-23 MED ORDER — ALBUTEROL SULFATE HFA 108 (90 BASE) MCG/ACT IN AERS: 2.0000 | INHALATION_SPRAY | Freq: Four times a day (QID) | RESPIRATORY_TRACT | 2 refills | Status: DC | PRN

## 2023-02-23 MED ORDER — GUAIFENESIN-CODEINE 100-10 MG/5ML PO SYRP: 5.0000 mL | ORAL_SOLUTION | Freq: Three times a day (TID) | ORAL | 0 refills | Status: DC | PRN

## 2023-02-23 MED ORDER — DOXYCYCLINE HYCLATE 100 MG PO TABS: 100.0000 mg | ORAL_TABLET | Freq: Two times a day (BID) | ORAL | 0 refills | Status: DC

## 2023-02-23 MED ORDER — PREDNISONE 10 MG PO TABS: 10.0000 mg | ORAL_TABLET | Freq: Every day | ORAL | 0 refills | Status: DC

## 2023-02-23 NOTE — Progress Notes (Signed)
Date:  02/23/2023   Name:  [name]   DOB:  July 31, 1956   MRN:  [id number]   Chief Complaint: Cough  Cough This is a recurrent problem. The current episode started in the past 7 days. The problem has been waxing and waning. The problem occurs constantly. The cough is Non-productive. Associated symptoms include a fever and wheezing. Pertinent negatives include no chest pain, chills, nasal congestion, postnasal drip, shortness of breath or weight loss. The treatment provided mild relief. There is no history of bronchitis.    Lab Results  Component Value Date   NA 141 08/22/2022   K 3.7 08/22/2022   CO2 27 08/22/2022   GLUCOSE 140 (H) 08/22/2022   BUN 13 08/22/2022   CREATININE 1.03 08/22/2022   CALCIUM 9.3 08/22/2022   EGFR 85 02/21/2022   GFRNONAA >60 08/22/2022   Lab Results  Component Value Date   CHOL 128 06/15/2022   HDL 46 06/15/2022   LDLCALC 67 06/15/2022   TRIG 76 06/15/2022   CHOLHDL 4.1 08/07/2019   Lab Results  Component Value Date   TSH 0.569 02/21/2022   Lab Results  Component Value Date   HGBA1C 6.1 (H) 08/24/2022   Lab Results  Component Value Date   WBC 5.0 02/23/2023   HGB 15.2 02/23/2023   HCT 44.7 02/23/2023   MCV 83.6 02/23/2023   PLT 197 02/23/2023   Lab Results  Component Value Date   ALT 16 06/15/2022   AST 13 06/15/2022   ALKPHOS 96 06/15/2022   BILITOT 0.4 06/15/2022   No results found for: "25OHVITD2", "25OHVITD3", "VD25OH"   Review of Systems  Constitutional:  Positive for fever. Negative for chills and weight loss.  HENT:  Negative for postnasal drip.   Respiratory:  Positive for cough and wheezing. Negative for shortness of breath.   Cardiovascular:  Negative for chest pain.    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   Hypokalemia 08/24/2021   Bacteremia due to Gram-negative bacteria    AF (paroxysmal atrial fibrillation) (HCC)    SIRS (systemic  inflammatory response syndrome) (HCC) 07/27/2021   Sepsis secondary to UTI (HCC) 07/27/2021   Ureteral stricture 06/18/2021   Sacroiliitis (HCC) 05/07/2021   Spinal stenosis of lumbosacral region 01/18/2021   Herniated nucleus pulposus, lumbar 03/16/2020   Body mass index (BMI) 33.0-33.9, adult 09/03/2019   Other intervertebral disc displacement, lumbar region 06/25/2019   HNP (herniated nucleus pulposus), lumbar 06/12/2019   UTI due to Klebsiella species 05/08/2019   Biceps tendonitis on right 07/27/2018   Spinal stenosis, 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   Lumbago with sciatica, unspecified side 11/14/2017   Primary osteoarthritis of right knee 10/12/2017   Taking multiple medications for chronic disease 07/30/2015   Reactive airway disease 07/01/2015   Allergic sinusitis 07/01/2015   Essential hypertension 07/01/2015   Valvular heart disease 07/01/2015   Metatarsalgia of left foot 06/10/2015   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    Allergies  Allergen Reactions   Meloxicam Palpitations   Sulfa Antibiotics Rash    Past Surgical History:  Procedure Laterality Date   ABDOMINAL EXPOSURE N/A 01/18/2021   Procedure: ABDOMINAL EXPOSURE;  Surgeon: Cephus Shelling, MD;  Location[facility];  Service: Vascular;  Laterality: N/A;   ANTERIOR LUMBAR FUSION N/A 01/18/2021   Procedure: Anterior Lumbar Interbody Fusion - Lumbar Five-Sarcal One;  Surgeon: Donalee Citrin, MD;  [facility] OR;  Service: Neurosurgery;  Laterality: N/A;  Anterior Lumbar Interbody Fusion - Lumbar Five-Sacral One   CYSTOSCOPY W/ URETERAL STENT PLACEMENT Left 06/18/2021   Procedure: CYSTOSCOPY WITH RETROGRADE PYELOGRAM/URETERAL STENT EXCHANGE;  Surgeon: Sebastian Ache, MD;  Location: [facility] ORS;  Service: Urology;  Laterality: Left;   CYSTOSCOPY WITH RETROGRADE PYELOGRAM, URETEROSCOPY AND STENT PLACEMENT Left 04/21/2021   Procedure: CYSTOSCOPY WITH RETROGRADE PYELOGRAM, DIAGNOSTICURETEROSCOPY AND STENT PLACEMENT;   Surgeon: Sebastian Ache, MD;  Location: [facility];  Service: Urology;  Laterality: Left;  1 HR   EXTRACORPOREAL SHOCK WAVE LITHOTRIPSY     approx 2002   LUMBAR DISC SURGERY  2018   LUMBAR LAMINECTOMY/DECOMPRESSION MICRODISCECTOMY Right 08/31/2022   Procedure: Redo foraminotomy right Lumbar four-five;  Surgeon: Donalee Citrin, MD;  Location: [facility] OR;  Service: Neurosurgery;  Laterality: Right;   REPAIR DURAL / CSF LEAK  2018   post op lumbar diskectomy   REVISION TOTAL KNEE ARTHROPLASTY Left 2015   TOTAL KNEE ARTHROPLASTY Left 2012   TRANSFORAMINAL LUMBAR INTERBODY FUSION (TLIF) WITH PEDICLE SCREW FIXATION 1 LEVEL N/A 06/12/2019   Procedure: LUMBAR FOUR-FIVE TRANSFORAMINAL LUMBAR INTERBODY FUSION (TLIF) WITH PEDICLE SCREW FIXATION;  Surgeon: Donalee Citrin, MD;  Location: [facility] OR;  Service: Neurosurgery;  Laterality: N/A;   UMBILICAL HERNIA REPAIR  1992    Social History   Tobacco Use   Smoking status: Never   Smokeless tobacco: Former    Types: Chew    Quit date: 03/2020  Vaping Use   Vaping Use: Never used  Substance Use Topics   Alcohol use: Yes    Comment: ocassional   Drug use: Never     Medication list has been reviewed and updated.  Current Meds  Medication Sig   acetaminophen (TYLENOL) 325 MG tablet Take 650 mg by mouth every 6 (six) hours as needed for moderate pain.   albuterol (VENTOLIN HFA) 108 (90 Base) MCG/ACT inhaler Inhale 2 puffs into the lungs every 6 (six) hours as needed for wheezing or shortness of breath.   apixaban (ELIQUIS) 5 MG TABS tablet Take 1 tablet (5 mg total) by mouth 2 (two) times daily. Restart Wednesday, November 16   celecoxib (CELEBREX) 200 MG capsule Take 200 mg by mouth daily.   cyclobenzaprine (FLEXERIL) 5 MG tablet Take 5 mg by mouth 3 (three) times daily as needed.   diltiazem (CARDIZEM CD) 180 MG 24 hr capsule Take 1 capsule (180 mg total) by mouth 2 (two) times daily.   doxycycline (VIBRAMYCIN) 100 MG capsule Take 1 capsule  (100 mg total) by mouth 2 (two) times daily for 7 days.   ferrous sulfate 325 (65 FE) MG tablet Take 325 mg by mouth daily with breakfast.   fexofenadine (ALLEGRA) 180 MG tablet Take 1 tablet (180 mg total) by mouth daily.   fluticasone (FLONASE) 50 MCG/ACT nasal spray Place 2 sprays into both nostrils daily as needed for allergies.   hydrochlorothiazide (HYDRODIURIL) 12.5 MG tablet Take 1 tablet (12.5 mg total) by mouth daily.   KLOR-CON M20 20 MEQ tablet TAKE 1 TO 2 TABLETS BY MOUTH TWICE DAILY IN THE MORNING AND IN THE EVENING   metoprolol succinate (TOPROL-XL) 25 MG 24 hr tablet Take 1 tablet (25 mg total) by mouth daily.   montelukast (SINGULAIR) 10 MG tablet Take 1 tablet (10 mg total) by mouth at bedtime.   Multiple Vitamins-Minerals (MULTIVITAMIN MEN) TABS Take 1 tablet by mouth daily.   pantoprazole (PROTONIX) 40 MG tablet Take 1 tablet (40 mg total) by mouth every evening.  rosuvastatin (CRESTOR) 5 MG tablet TAKE 1 TABLET BY MOUTH THREE TIMES A WEEK   tadalafil (CIALIS) 5 MG tablet Take 1-2 tablets (5-10 mg total) by mouth daily as needed for erectile dysfunction.   tamsulosin (FLOMAX) 0.4 MG CAPS capsule Take 1 capsule (0.4 mg total) by mouth daily.   valsartan (DIOVAN) 320 MG tablet Take 1 tablet (320 mg total) by mouth daily.   [DISCONTINUED] predniSONE (DELTASONE) 10 MG tablet Take 1 tablet (10 mg total) by mouth daily with breakfast.       02/23/2023    4:05 PM 01/20/2023    3:02 PM 01/13/2023    1:16 PM 12/05/2022    4:04 PM  GAD 7 : Generalized Anxiety Score  Nervous, Anxious, on Edge 0 0 0 0  Control/stop worrying 0 0 0 0  Worry too much - different things 0 0 0 0  Trouble relaxing 0 0 0 0  Restless 0 0 0 0  Easily annoyed or irritable 0 0 0 0  Afraid - awful might happen 0 0 0 0  Total GAD 7 Score 0 0 0 0  Anxiety Difficulty Not difficult at all Not difficult at all Not difficult at all Not difficult at all       02/23/2023    4:05 PM 01/20/2023    3:02 PM 01/13/2023     1:16 PM  Depression screen PHQ 2/9  Decreased Interest 0 0 0  Down, Depressed, Hopeless 0 0 0  PHQ - 2 Score 0 0 0  Altered sleeping 0 0 0  Tired, decreased energy 0 0 0  Change in appetite 0 0 0  Feeling bad or failure about yourself  0 0 0  Trouble concentrating 0 0 0  Moving slowly or fidgety/restless 0 0 0  Suicidal thoughts 0 0 0  PHQ-9 Score 0 0 0  Difficult doing work/chores Not difficult at all Not difficult at all Not difficult at all    BP Readings from Last 3 Encounters:  02/23/23 124/76  02/19/23 (!) 150/89  01/20/23 132/60    Physical Exam Vitals and nursing note reviewed.  HENT:     Head: Normocephalic.     Right Ear: Tympanic membrane and external ear normal.     Left Ear: Tympanic membrane and external ear normal.     Nose: Nose normal.     Mouth/Throat:     Mouth: Mucous membranes are moist.  Eyes:     General: No scleral icterus.       Right eye: No discharge.        Left eye: No discharge.     Conjunctiva/sclera: Conjunctivae normal.     Pupils: Pupils are equal, round, and reactive to light.  Neck:     Thyroid: No thyromegaly.     Vascular: No JVD.     Trachea: No tracheal deviation.  Cardiovascular:     Rate and Rhythm: Normal rate and regular rhythm.     Heart sounds: Normal heart sounds. No murmur heard.    No friction rub. No gallop.  Pulmonary:     Effort: No respiratory distress.     Breath sounds: Rhonchi present. No wheezing or rales.  Abdominal:     General: Bowel sounds are normal.     Palpations: Abdomen is soft. There is no mass.     Tenderness: There is no abdominal tenderness. There is no guarding or rebound.  Musculoskeletal:        General: No tenderness. Normal  range of motion.     Cervical back: Normal range of motion and neck supple.  Lymphadenopathy:     Cervical: No cervical adenopathy.  Skin:    General: Skin is warm.     Findings: No rash.  Neurological:     Mental Status: He is alert.     Wt Readings from  Last 3 Encounters:  02/23/23 244 lb (110.7 kg)  02/19/23 240 lb (108.9 kg)  01/20/23 245 lb (111.1 kg)    BP 124/76   Pulse (!) 48   Ht 6\' 1"  (1.854 m)   Wt 244 lb (110.7 kg)   BMI 32.19 kg/m   Assessment and Plan: 1. Mild intermittent reactive airway disease with acute exacerbation Chronic.  Intermittent.  Exacerbation thereof.  Recently returned from another trip with exposure to multiple individuals.  As per previous encounters we will reinitiate albuterol inhaler short acting 1 to 2 puffs every 6 hours prednisone 10 mg once a day.  And at this point in time we will refer to pulmonary for formal evaluation with pulmonary function and for concerns of occupational exposure to the lungs over long periods of time and whether there is any other concerns such as an interstitial nature versus COPD involved at this time. - albuterol (VENTOLIN HFA) 108 (90 Base) MCG/ACT inhaler; Inhale 2 puffs into the lungs every 6 (six) hours as needed for wheezing or shortness of breath.  Dispense: 8 g; Refill: 2 - predniSONE (DELTASONE) 10 MG tablet; Take 1 tablet (10 mg total) by mouth daily with breakfast.  Dispense: 20 tablet; Refill: 0 - Ambulatory referral to Pulmonology  2. Bronchitis Patient has been on doxycycline for a cutaneous abscess and he is almost done with a 7-day course we will extend this for an additional week so as not to disrupt to 10 days.  Until we can get a full knowledge of what is going on with the respiratory concern.  Review of previous x-ray that was done in February noted no new pneumonia. - doxycycline (VIBRA-TABS) 100 MG tablet; Take 1 tablet (100 mg total) by mouth 2 (two) times daily.  Dispense: 20 tablet; Refill: 0 - Ambulatory referral to Pulmonology  3. Acute cough Patient has acute cough in addition to using Robitussin AC primarily at night I have encouraged them to initiate Mucinex DM during the day. - guaiFENesin-codeine (ROBITUSSIN AC) 100-10 MG/5ML syrup; Take 5 mLs  by mouth 3 (three) times daily as needed for cough.  Dispense: 118 mL; Refill: 0 - predniSONE (DELTASONE) 10 MG tablet; Take 1 tablet (10 mg total) by mouth daily with breakfast.  Dispense: 20 tablet; Refill: 0     Elizabeth Sauer, MD

## 2023-02-24 NOTE — Telephone Encounter (Signed)
Requested Prescriptions  Pending Prescriptions Disp Refills   fexofenadine (ALLEGRA) 180 MG tablet [Pharmacy Med Name: ALLERGY RELIEF 180MG    TAB] 90 tablet 0    Sig: Take 1 tablet by mouth once daily     Ear, Nose, and Throat:  Antihistamines Passed - 02/23/2023  1:23 PM      Passed - Valid encounter within last 12 months    Recent Outpatient Visits           Yesterday Mild intermittent reactive airway disease with acute exacerbation   [place] [facility]   1 month ago Acute maxillary sinusitis, recurrence not specified   [place] [facility], [place]   1 month ago Acute URI   [facility] at [facility], [place], [place]   2 months ago Bronchitis   [facility] at [facility], [place]   2 months ago Sore throat   [place] [facility], [facility]       Future Appointments             In 5 days Duanne Limerick, MD [facility] at [facility], [facility]   In 7 months Richardo Hanks, Laurette Schimke, MD [facility] [place]

## 2023-02-27 DIAGNOSIS — I480 Paroxysmal atrial fibrillation: Secondary | ICD-10-CM | POA: Diagnosis not present

## 2023-02-27 DIAGNOSIS — I10 Essential (primary) hypertension: Secondary | ICD-10-CM | POA: Diagnosis not present

## 2023-02-27 DIAGNOSIS — I38 Endocarditis, valve unspecified: Secondary | ICD-10-CM | POA: Diagnosis not present

## 2023-02-27 DIAGNOSIS — E782 Mixed hyperlipidemia: Secondary | ICD-10-CM | POA: Diagnosis not present

## 2023-02-28 ENCOUNTER — Other Ambulatory Visit: Payer: Self-pay | Admitting: Family Medicine

## 2023-02-28 DIAGNOSIS — S32009K Unspecified fracture of unspecified lumbar vertebra, subsequent encounter for fracture with nonunion: Secondary | ICD-10-CM | POA: Diagnosis not present

## 2023-02-28 DIAGNOSIS — M5416 Radiculopathy, lumbar region: Secondary | ICD-10-CM | POA: Diagnosis not present

## 2023-02-28 NOTE — Telephone Encounter (Signed)
Requested Prescriptions  Pending Prescriptions Disp Refills   fluticasone (FLONASE) 50 MCG/ACT nasal spray [Pharmacy Med Name: Fluticasone Propionate 50 MCG/ACT Nasal Suspension] 18 mL 1    Sig: USE 2 SPRAY(S) IN EACH NOSTRIL ONCE DAILY AS NEEDED FOR ALLERGIES     Ear, Nose, and Throat: Nasal Preparations - Corticosteroids Passed - 02/28/2023  7:37 AM      Passed - Valid encounter within last 12 months    Recent Outpatient Visits           5 days ago Mild intermittent reactive airway disease with acute exacerbation   [place] [facility], [place]   1 month ago Acute maxillary sinusitis, recurrence not specified   [place] [facility], [place]   1 month ago Acute URI   [facility] at [facility], [place], [place]   2 months ago Bronchitis   [facility] at [facility] [place]   2 months ago Sore throat   [place] [facility], [place]       Future Appointments             Tomorrow Duanne Limerick, MD [facility] & [facility] Medicine at [facility], [facility]   In 7 months Richardo Hanks, Laurette Schimke, MD [facility] [place]

## 2023-03-01 ENCOUNTER — Ambulatory Visit: Payer: Medicare PPO | Admitting: Family Medicine

## 2023-03-01 ENCOUNTER — Encounter: Payer: Self-pay | Admitting: Family Medicine

## 2023-03-01 VITALS — BP 128/76 | HR 53 | Ht 73.0 in | Wt 246.0 lb

## 2023-03-01 DIAGNOSIS — J302 Other seasonal allergic rhinitis: Secondary | ICD-10-CM | POA: Diagnosis not present

## 2023-03-01 DIAGNOSIS — E782 Mixed hyperlipidemia: Secondary | ICD-10-CM

## 2023-03-01 DIAGNOSIS — R7303 Prediabetes: Secondary | ICD-10-CM

## 2023-03-01 DIAGNOSIS — I10 Essential (primary) hypertension: Secondary | ICD-10-CM

## 2023-03-01 DIAGNOSIS — R601 Generalized edema: Secondary | ICD-10-CM | POA: Diagnosis not present

## 2023-03-01 DIAGNOSIS — J4520 Mild intermittent asthma, uncomplicated: Secondary | ICD-10-CM

## 2023-03-01 DIAGNOSIS — I38 Endocarditis, valve unspecified: Secondary | ICD-10-CM

## 2023-03-01 DIAGNOSIS — K219 Gastro-esophageal reflux disease without esophagitis: Secondary | ICD-10-CM | POA: Diagnosis not present

## 2023-03-01 MED ORDER — HYDROCHLOROTHIAZIDE 12.5 MG PO TABS: 12.5000 mg | ORAL_TABLET | Freq: Every day | ORAL | 1 refills | Status: DC

## 2023-03-01 MED ORDER — DILTIAZEM HCL ER COATED BEADS 180 MG PO CP24: 180.0000 mg | ORAL_CAPSULE | Freq: Two times a day (BID) | ORAL | 1 refills | Status: DC

## 2023-03-01 MED ORDER — MONTELUKAST SODIUM 10 MG PO TABS
10.0000 mg | ORAL_TABLET | Freq: Every day | ORAL | 0 refills | Status: DC
Start: 2023-03-01 — End: 2023-09-03

## 2023-03-01 MED ORDER — FEXOFENADINE HCL 180 MG PO TABS: 180.0000 mg | ORAL_TABLET | Freq: Every day | ORAL | 1 refills | Status: DC

## 2023-03-01 MED ORDER — ROSUVASTATIN CALCIUM 5 MG PO TABS
ORAL_TABLET | ORAL | 1 refills | Status: DC
Start: 2023-03-01 — End: 2023-09-15

## 2023-03-01 MED ORDER — PANTOPRAZOLE SODIUM 40 MG PO TBEC: 40.0000 mg | DELAYED_RELEASE_TABLET | Freq: Every evening | ORAL | 1 refills | Status: DC

## 2023-03-01 MED ORDER — VALSARTAN 320 MG PO TABS: 320.0000 mg | ORAL_TABLET | Freq: Every day | ORAL | 1 refills | Status: DC

## 2023-03-01 NOTE — Progress Notes (Unsigned)
Date:  03/01/2023   Name:  [name]   DOB:  1956/04/27   MRN:  [id number]   Chief Complaint: Hypertension, Hyperlipidemia, Allergic Rhinitis , and Prediabetes  Hypertension This is a chronic problem. The current episode started more than 1 year ago. The problem has been gradually improving since onset. The problem is controlled. Pertinent negatives include no blurred vision, chest pain, headaches, neck pain, palpitations or shortness of breath. There are no associated agents to hypertension. Risk factors for coronary artery disease include dyslipidemia and smoking/tobacco exposure. Past treatments include calcium channel blockers, angiotensin blockers and diuretics. The current treatment provides moderate improvement. There are no compliance problems.  There is no history of CAD/MI or CVA. There is no history of chronic renal disease, a hypertension causing med or renovascular disease.  Hyperlipidemia This is a chronic problem. The current episode started more than 1 year ago. The problem is controlled. Recent lipid tests were reviewed and are normal. He has no history of chronic renal disease or diabetes. Pertinent negatives include no chest pain, myalgias or shortness of breath. Current antihyperlipidemic treatment includes statins. The current treatment provides moderate improvement of lipids. There are no compliance problems.   Gastroesophageal Reflux He reports no abdominal pain, no chest pain, no choking, no dysphagia or no heartburn. This is a chronic problem. The problem has been gradually improving. Pertinent negatives include no melena. He has tried a PPI for the symptoms. The treatment provided mild relief.  URI  This is a chronic problem. The current episode started more than 1 year ago. The problem has been gradually improving. Pertinent negatives include no abdominal pain, chest pain, congestion, dysuria, headaches or neck pain.    Lab Results  Component Value Date   NA  141 08/22/2022   K 3.7 08/22/2022   CO2 27 08/22/2022   GLUCOSE 140 (H) 08/22/2022   BUN 13 08/22/2022   CREATININE 1.03 08/22/2022   CALCIUM 9.3 08/22/2022   EGFR 85 02/21/2022   GFRNONAA >60 08/22/2022   Lab Results  Component Value Date   CHOL 128 06/15/2022   HDL 46 06/15/2022   LDLCALC 67 06/15/2022   TRIG 76 06/15/2022   CHOLHDL 4.1 08/07/2019   Lab Results  Component Value Date   TSH 0.569 02/21/2022   Lab Results  Component Value Date   HGBA1C 6.1 (H) 08/24/2022   Lab Results  Component Value Date   WBC 5.0 02/23/2023   HGB 15.2 02/23/2023   HCT 44.7 02/23/2023   MCV 83.6 02/23/2023   PLT 197 02/23/2023   Lab Results  Component Value Date   ALT 16 06/15/2022   AST 13 06/15/2022   ALKPHOS 96 06/15/2022   BILITOT 0.4 06/15/2022   No results found for: "25OHVITD2", "25OHVITD3", "VD25OH"   Review of Systems  HENT:  Negative for congestion.   Eyes:  Negative for blurred vision.  Respiratory:  Negative for choking and shortness of breath.   Cardiovascular:  Negative for chest pain and palpitations.  Gastrointestinal:  Negative for abdominal pain, dysphagia, heartburn and melena.  Endocrine: Negative for polydipsia and polyuria.  Genitourinary:  Negative for dysuria.  Musculoskeletal:  Negative for myalgias and neck pain.  Neurological:  Negative for headaches.    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   Hypokalemia 08/24/2021   Bacteremia due to Gram-negative bacteria    AF (paroxysmal atrial fibrillation) (HCC)    SIRS (systemic inflammatory  response syndrome) (HCC) 07/27/2021   Sepsis secondary to UTI (HCC) 07/27/2021   Ureteral stricture 06/18/2021   Sacroiliitis (HCC) 05/07/2021   Spinal stenosis of lumbosacral region 01/18/2021   Herniated nucleus pulposus, lumbar 03/16/2020   Body mass index (BMI) 33.0-33.9, adult 09/03/2019   Other intervertebral disc displacement, lumbar region  06/25/2019   HNP (herniated nucleus pulposus), lumbar 06/12/2019   UTI due to Klebsiella species 05/08/2019   Biceps tendonitis on right 07/27/2018   Spinal stenosis, 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   Lumbago with sciatica, unspecified side 11/14/2017   Primary osteoarthritis of right knee 10/12/2017   Taking multiple medications for chronic disease 07/30/2015   Reactive airway disease 07/01/2015   Allergic sinusitis 07/01/2015   Essential hypertension 07/01/2015   Valvular heart disease 07/01/2015   Metatarsalgia of left foot 06/10/2015   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    Allergies  Allergen Reactions   Meloxicam Palpitations   Sulfa Antibiotics Rash    Past Surgical History:  Procedure Laterality Date   ABDOMINAL EXPOSURE N/A 01/18/2021   Procedure: ABDOMINAL EXPOSURE;  Surgeon: Cephus Shelling, MD;  Location: [facility] OR;  Service: Vascular;  Laterality: N/A;   ANTERIOR LUMBAR FUSION N/A 01/18/2021   Procedure: Anterior Lumbar Interbody Fusion - Lumbar Five-Sarcal One;  Surgeon: Donalee Citrin, MD;  Location: [facility] OR;  Service: Neurosurgery;  Laterality: N/A;  Anterior Lumbar Interbody Fusion - Lumbar Five-Sacral One   CYSTOSCOPY W/ URETERAL STENT PLACEMENT Left 06/18/2021   Procedure: CYSTOSCOPY WITH RETROGRADE PYELOGRAM/URETERAL STENT EXCHANGE;  Surgeon: Sebastian Ache, MD;  Location: [facility] ORS;  Service: Urology;  Laterality: Left;   CYSTOSCOPY WITH RETROGRADE PYELOGRAM, URETEROSCOPY AND STENT PLACEMENT Left 04/21/2021   Procedure: CYSTOSCOPY WITH RETROGRADE PYELOGRAM, DIAGNOSTICURETEROSCOPY AND STENT PLACEMENT;  Surgeon: Sebastian Ache, MD;  Location: [facility];  Service: Urology;  Laterality: Left;  1 HR   EXTRACORPOREAL SHOCK WAVE LITHOTRIPSY     approx 2002   LUMBAR DISC SURGERY  2018   LUMBAR LAMINECTOMY/DECOMPRESSION MICRODISCECTOMY Right 08/31/2022   Procedure: Redo foraminotomy right Lumbar four-five;  Surgeon: Donalee Citrin, MD;  Location: [facility] OR;   Service: Neurosurgery;  Laterality: Right;   REPAIR DURAL / CSF LEAK  2018   post op lumbar diskectomy   REVISION TOTAL KNEE ARTHROPLASTY Left 2015   TOTAL KNEE ARTHROPLASTY Left 2012   TRANSFORAMINAL LUMBAR INTERBODY FUSION (TLIF) WITH PEDICLE SCREW FIXATION 1 LEVEL N/A 06/12/2019   Procedure: LUMBAR FOUR-FIVE TRANSFORAMINAL LUMBAR INTERBODY FUSION (TLIF) WITH PEDICLE SCREW FIXATION;  Surgeon: Donalee Citrin, MD;  Location: [facility] OR;  Service: Neurosurgery;  Laterality: N/A;   UMBILICAL HERNIA REPAIR  1992    Social History   Tobacco Use   Smoking status: Never   Smokeless tobacco: Former    Types: Chew    Quit date: 03/2020  Vaping Use   Vaping Use: Never used  Substance Use Topics   Alcohol use: Yes    Comment: ocassional   Drug use: Never     Medication list has been reviewed and updated.  Current Meds  Medication Sig   acetaminophen (TYLENOL) 325 MG tablet Take 650 mg by mouth every 6 (six) hours as needed for moderate pain.   albuterol (VENTOLIN HFA) 108 (90 Base) MCG/ACT inhaler Inhale 2 puffs into the lungs every 6 (six) hours as needed for wheezing or shortness of breath.   apixaban (ELIQUIS) 5 MG TABS tablet Take 1 tablet (5 mg total) by mouth 2 (two) times daily. Restart Wednesday, November 16  celecoxib (CELEBREX) 200 MG capsule Take 200 mg by mouth daily.   cyclobenzaprine (FLEXERIL) 5 MG tablet Take 5 mg by mouth 3 (three) times daily as needed.   diltiazem (CARDIZEM CD) 180 MG 24 hr capsule Take 1 capsule (180 mg total) by mouth 2 (two) times daily.   doxycycline (VIBRA-TABS) 100 MG tablet Take 1 tablet (100 mg total) by mouth 2 (two) times daily.   ferrous sulfate 325 (65 FE) MG tablet Take 325 mg by mouth daily with breakfast.   fexofenadine (ALLEGRA) 180 MG tablet Take 1 tablet by mouth once daily   fluticasone (FLONASE) 50 MCG/ACT nasal spray USE 2 SPRAY(S) IN EACH NOSTRIL ONCE DAILY AS NEEDED FOR ALLERGIES   guaiFENesin-codeine (ROBITUSSIN AC) 100-10 MG/5ML syrup  Take 5 mLs by mouth 3 (three) times daily as needed for cough.   hydrochlorothiazide (HYDRODIURIL) 12.5 MG tablet Take 1 tablet (12.5 mg total) by mouth daily.   KLOR-CON M20 20 MEQ tablet TAKE 1 TO 2 TABLETS BY MOUTH TWICE DAILY IN THE MORNING AND IN THE EVENING   metoprolol succinate (TOPROL-XL) 50 MG 24 hr tablet Take 1 tablet by mouth daily. cardio   montelukast (SINGULAIR) 10 MG tablet Take 1 tablet (10 mg total) by mouth at bedtime.   Multiple Vitamins-Minerals (MULTIVITAMIN MEN) TABS Take 1 tablet by mouth daily.   pantoprazole (PROTONIX) 40 MG tablet Take 1 tablet (40 mg total) by mouth every evening.   predniSONE (DELTASONE) 10 MG tablet Take 1 tablet (10 mg total) by mouth daily with breakfast.   rosuvastatin (CRESTOR) 5 MG tablet TAKE 1 TABLET BY MOUTH THREE TIMES A WEEK   tadalafil (CIALIS) 5 MG tablet Take 1-2 tablets (5-10 mg total) by mouth daily as needed for erectile dysfunction.   tamsulosin (FLOMAX) 0.4 MG CAPS capsule Take 1 capsule (0.4 mg total) by mouth daily.   valsartan (DIOVAN) 320 MG tablet Take 1 tablet (320 mg total) by mouth daily.       02/23/2023    4:05 PM 01/20/2023    3:02 PM 01/13/2023    1:16 PM 12/05/2022    4:04 PM  GAD 7 : Generalized Anxiety Score  Nervous, Anxious, on Edge 0 0 0 0  Control/stop worrying 0 0 0 0  Worry too much - different things 0 0 0 0  Trouble relaxing 0 0 0 0  Restless 0 0 0 0  Easily annoyed or irritable 0 0 0 0  Afraid - awful might happen 0 0 0 0  Total GAD 7 Score 0 0 0 0  Anxiety Difficulty Not difficult at all Not difficult at all Not difficult at all Not difficult at all       02/23/2023    4:05 PM 01/20/2023    3:02 PM 01/13/2023    1:16 PM  Depression screen PHQ 2/9  Decreased Interest 0 0 0  Down, Depressed, Hopeless 0 0 0  PHQ - 2 Score 0 0 0  Altered sleeping 0 0 0  Tired, decreased energy 0 0 0  Change in appetite 0 0 0  Feeling bad or failure about yourself  0 0 0  Trouble concentrating 0 0 0  Moving  slowly or fidgety/restless 0 0 0  Suicidal thoughts 0 0 0  PHQ-9 Score 0 0 0  Difficult doing work/chores Not difficult at all Not difficult at all Not difficult at all    BP Readings from Last 3 Encounters:  03/01/23 128/76  02/23/23 124/76  02/19/23 Marland Kitchen)  150/89    Physical Exam Vitals and nursing note reviewed.  HENT:     Head: Normocephalic.     Right Ear: Tympanic membrane, ear canal and external ear normal.     Left Ear: Tympanic membrane, ear canal and external ear normal.     Nose: Nose normal. No congestion.     Mouth/Throat:     Mouth: Mucous membranes are moist.  Eyes:     General: No scleral icterus.       Right eye: No discharge.        Left eye: No discharge.     Conjunctiva/sclera: Conjunctivae normal.     Pupils: Pupils are equal, round, and reactive to light.  Neck:     Thyroid: No thyromegaly.     Vascular: No JVD.     Trachea: No tracheal deviation.  Cardiovascular:     Rate and Rhythm: Normal rate and regular rhythm.     Heart sounds: Normal heart sounds. No murmur heard.    No friction rub. No gallop.  Pulmonary:     Effort: No respiratory distress.     Breath sounds: Normal breath sounds. No stridor. No wheezing, rhonchi or rales.  Abdominal:     General: Bowel sounds are normal.     Palpations: Abdomen is soft. There is no mass.     Tenderness: There is no abdominal tenderness. There is no guarding or rebound.  Musculoskeletal:        General: No tenderness. Normal range of motion.     Cervical back: Normal range of motion and neck supple.  Lymphadenopathy:     Cervical: No cervical adenopathy.  Skin:    General: Skin is warm.     Findings: No rash.  Neurological:     Mental Status: He is alert and oriented to person, place, and time.     Cranial Nerves: No cranial nerve deficit.     Deep Tendon Reflexes: Reflexes are normal and symmetric.     Wt Readings from Last 3 Encounters:  03/01/23 246 lb (111.6 kg)  02/23/23 244 lb (110.7 kg)   02/19/23 240 lb (108.9 kg)    BP 128/76   Pulse (!) 53   Ht 6\' 1"  (1.854 m)   Wt 246 lb (111.6 kg)   SpO2 95%   BMI 32.46 kg/m   Assessment and Plan: 1. Essential hypertension Chronic.  Controlled.  Stable.  Blood pressure 128/76.  Asymptomatic.  Tolerating medication well.  Continue diltiazem 180 mg 1 capsule twice a day, hydrochlorothiazide 12.5 mg once a day and valsartan 320 mg once a day.  Review of previous renal function panel is acceptable.  Will recheck patient in 6 months. - diltiazem (CARDIZEM CD) 180 MG 24 hr capsule; Take 1 capsule (180 mg total) by mouth 2 (two) times daily.  Dispense: 180 capsule; Refill: 1 - hydrochlorothiazide (HYDRODIURIL) 12.5 MG tablet; Take 1 tablet (12.5 mg total) by mouth daily.  Dispense: 90 tablet; Refill: 1 - valsartan (DIOVAN) 320 MG tablet; Take 1 tablet (320 mg total) by mouth daily.  Dispense: 90 tablet; Refill: 1  2. Other seasonal allergic rhinitis Chronic.  Controlled.  Stable.  Continue Allegra 180 mg daily. - fexofenadine (ALLEGRA) 180 MG tablet; Take 1 tablet (180 mg total) by mouth daily.  Dispense: 90 tablet; Refill: 1  3. Generalized edema Chronic.  Controlled.  Stable.  In addition to blood pressure control hydrochlorothiazide is used for control generalized edema. - hydrochlorothiazide (HYDRODIURIL) 12.5 MG tablet; Take  1 tablet (12.5 mg total) by mouth daily.  Dispense: 90 tablet; Refill: 1  4. Valvular heart disease As noted above - hydrochlorothiazide (HYDRODIURIL) 12.5 MG tablet; Take 1 tablet (12.5 mg total) by mouth daily.  Dispense: 90 tablet; Refill: 1  5. Seasonal allergic rhinitis, unspecified trigger Chronic.  Controlled.  Stable.  Continue Singulair 10 mg once a day. - montelukast (SINGULAIR) 10 MG tablet; Take 1 tablet (10 mg total) by mouth at bedtime.  Dispense: 90 tablet; Refill: 0  6. Gastroesophageal reflux disease, unspecified whether esophagitis present Chronic.  Controlled.  Stable.  Continue Protonix  40 mg once a day. - pantoprazole (PROTONIX) 40 MG tablet; Take 1 tablet (40 mg total) by mouth every evening.  Dispense: 90 tablet; Refill: 1  7. Mixed hyperlipidemia Chronic.  Controlled.  Stable.  Continue rosuvastatin 5 mg 3 times a week.  Patient is able to tolerate this with minimal myalgia.  Will recheck lipid panel for current level of control. - rosuvastatin (CRESTOR) 5 MG tablet; TAKE 1 TABLET BY MOUTH THREE TIMES A WEEK  Dispense: 36 tablet; Refill: 1 - Lipid Panel With LDL/HDL Ratio  8. Prediabetes Chronic.  Controlled.  Stable.  Continue with diet controlled pending A1c measurement. - HgB A1c  9. Mild intermittent reactive airway disease without complication Chronic.  With recurrent exacerbations presenting as cough.  I do have concerns given that this may be an occupational circumstance that we need further evaluation and perhaps intervention by pulmonary.  We have placed a referral for this.  In the meantime patient will continue with bronchodilation therapy/albuterol inhaler 2 puffs every 6 hours.    Elizabeth Sauer, MD

## 2023-03-02 ENCOUNTER — Encounter: Payer: Self-pay | Admitting: Family Medicine

## 2023-03-02 LAB — LIPID PANEL WITH LDL/HDL RATIO
Cholesterol, Total: 134 mg/dL (ref 100–199)
HDL: 40 mg/dL (ref >39)
LDL Chol Calc (NIH): 78 mg/dL (ref 0–99)
LDL/HDL Ratio: 2 ratio (ref 0.0–3.6)
Triglycerides: 84 mg/dL (ref 0–149)
VLDL Cholesterol Cal: 16 mg/dL (ref 5–40)

## 2023-03-02 LAB — HEMOGLOBIN A1C
Est. average glucose Bld gHb Est-mCnc: 137 mg/dL
Hgb A1c MFr Bld: 6.4 % — ABNORMAL HIGH (ref 4.8–5.6)

## 2023-03-06 ENCOUNTER — Ambulatory Visit: Payer: Medicare PPO | Admitting: Family Medicine

## 2023-03-06 ENCOUNTER — Telehealth: Payer: Self-pay | Admitting: Family Medicine

## 2023-03-06 ENCOUNTER — Ambulatory Visit: Payer: Self-pay | Admitting: *Deleted

## 2023-03-06 ENCOUNTER — Encounter: Payer: Self-pay | Admitting: Family Medicine

## 2023-03-06 VITALS — BP 122/64 | HR 64 | Ht 73.0 in | Wt 247.0 lb

## 2023-03-06 DIAGNOSIS — J329 Chronic sinusitis, unspecified: Secondary | ICD-10-CM

## 2023-03-06 DIAGNOSIS — E782 Mixed hyperlipidemia: Secondary | ICD-10-CM | POA: Diagnosis not present

## 2023-03-06 DIAGNOSIS — J4520 Mild intermittent asthma, uncomplicated: Secondary | ICD-10-CM

## 2023-03-06 DIAGNOSIS — I10 Essential (primary) hypertension: Secondary | ICD-10-CM | POA: Diagnosis not present

## 2023-03-06 DIAGNOSIS — I480 Paroxysmal atrial fibrillation: Secondary | ICD-10-CM | POA: Diagnosis not present

## 2023-03-06 DIAGNOSIS — J40 Bronchitis, not specified as acute or chronic: Secondary | ICD-10-CM | POA: Diagnosis not present

## 2023-03-06 DIAGNOSIS — I77819 Aortic ectasia, unspecified site: Secondary | ICD-10-CM | POA: Diagnosis not present

## 2023-03-06 DIAGNOSIS — I38 Endocarditis, valve unspecified: Secondary | ICD-10-CM | POA: Diagnosis not present

## 2023-03-06 MED ORDER — PREDNISONE 10 MG PO TABS
10.0000 mg | ORAL_TABLET | Freq: Every day | ORAL | 0 refills | Status: DC
Start: 2023-03-06 — End: 2023-03-24

## 2023-03-06 MED ORDER — AZITHROMYCIN 250 MG PO TABS: ORAL_TABLET | ORAL | 0 refills | Status: AC

## 2023-03-06 NOTE — Telephone Encounter (Signed)
Summary: Cough Advice   Pt is calling to report that he was seen in office on 03/01/23 guaiFENesin-codeine [facility]) 100-10 MG/5ML syrup [[id number]] & doxycycline (VIBRA-TABS) 100 MG tablet [[id number]]. Pt reports sx still include fatigue and cough. Would like to continue on round of antibiotics. Please advise      Called patient [phone] to review sx of cough and fatigue. No answer, LVMTCB [phone].

## 2023-03-06 NOTE — Progress Notes (Signed)
Date:  03/06/2023   Name:  [name]   DOB:  Mar 05, 1956   MRN:  [id number]   Chief Complaint: Cough (Finishing Doxy. In the morning. Green production)  Asthma He complains of chest tightness, cough, frequent throat clearing, sputum production and wheezing. There is no shortness of breath. This is a new problem. The current episode started more than 1 year ago. The problem occurs intermittently. The problem has been gradually improving. The cough is productive of sputum and productive of purulent sputum. Associated symptoms include nasal congestion, postnasal drip and rhinorrhea. Pertinent negatives include no chest pain, fever, sneezing or sore throat. His symptoms are alleviated by beta-agonist, oral steroids, ipratropium and steroid inhaler. He reports minimal improvement on treatment. His past medical history is significant for asthma.    Lab Results  Component Value Date   NA 141 08/22/2022   K 3.7 08/22/2022   CO2 27 08/22/2022   GLUCOSE 140 (H) 08/22/2022   BUN 13 08/22/2022   CREATININE 1.03 08/22/2022   CALCIUM 9.3 08/22/2022   EGFR 85 02/21/2022   GFRNONAA >60 08/22/2022   Lab Results  Component Value Date   CHOL 134 03/01/2023   HDL 40 03/01/2023   LDLCALC 78 03/01/2023   TRIG 84 03/01/2023   CHOLHDL 4.1 08/07/2019   Lab Results  Component Value Date   TSH 0.569 02/21/2022   Lab Results  Component Value Date   HGBA1C 6.4 (H) 03/01/2023   Lab Results  Component Value Date   WBC 5.0 02/23/2023   HGB 15.2 02/23/2023   HCT 44.7 02/23/2023   MCV 83.6 02/23/2023   PLT 197 02/23/2023   Lab Results  Component Value Date   ALT 16 06/15/2022   AST 13 06/15/2022   ALKPHOS 96 06/15/2022   BILITOT 0.4 06/15/2022   No results found for: "25OHVITD2", "25OHVITD3", "VD25OH"   Review of Systems  Constitutional:  Negative for chills and fever.  HENT:  Positive for postnasal drip and rhinorrhea. Negative for sinus pressure, sneezing and sore throat.    Respiratory:  Positive for cough, sputum production and wheezing. Negative for chest tightness and shortness of breath.   Cardiovascular:  Negative for chest pain and palpitations.  Gastrointestinal:  Negative for abdominal distention.    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   Hypokalemia 08/24/2021   Bacteremia due to Gram-negative bacteria    AF (paroxysmal atrial fibrillation) (HCC)    SIRS (systemic inflammatory response syndrome) (HCC) 07/27/2021   Sepsis secondary to UTI (HCC) 07/27/2021   Ureteral stricture 06/18/2021   Sacroiliitis (HCC) 05/07/2021   Spinal stenosis of lumbosacral region 01/18/2021   Herniated nucleus pulposus, lumbar 03/16/2020   Body mass index (BMI) 33.0-33.9, adult 09/03/2019   Other intervertebral disc displacement, lumbar region 06/25/2019   HNP (herniated nucleus pulposus), lumbar 06/12/2019   UTI due to Klebsiella species 05/08/2019   Biceps tendonitis on right 07/27/2018   Spinal stenosis, 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   Lumbago with sciatica, unspecified side 11/14/2017   Primary osteoarthritis of right knee 10/12/2017   Taking multiple medications for chronic disease 07/30/2015   Reactive airway disease 07/01/2015   Allergic sinusitis 07/01/2015   Essential hypertension 07/01/2015   Valvular heart disease 07/01/2015   Metatarsalgia of left foot 06/10/2015   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    Allergies  Allergen Reactions   Meloxicam Palpitations   Sulfa Antibiotics Rash    Past Surgical  History:  Procedure Laterality Date   ABDOMINAL EXPOSURE N/A 01/18/2021   Procedure: ABDOMINAL EXPOSURE;  Surgeon: Cephus Shelling, MD;  Location: [facility] OR;  Service: Vascular;  Laterality: N/A;   ANTERIOR LUMBAR FUSION N/A 01/18/2021   Procedure: Anterior Lumbar Interbody Fusion - Lumbar Five-Sarcal One;  Surgeon: Donalee Citrin, MD;  Location: [facility] OR;  Service: Neurosurgery;   Laterality: N/A;  Anterior Lumbar Interbody Fusion - Lumbar Five-Sacral One   CYSTOSCOPY W/ URETERAL STENT PLACEMENT Left 06/18/2021   Procedure: CYSTOSCOPY WITH RETROGRADE PYELOGRAM/URETERAL STENT EXCHANGE;  Surgeon: Sebastian Ache, MD;  Location: [facility] ORS;  Service: Urology;  Laterality: Left;   CYSTOSCOPY WITH RETROGRADE PYELOGRAM, URETEROSCOPY AND STENT PLACEMENT Left 04/21/2021   Procedure: CYSTOSCOPY WITH RETROGRADE PYELOGRAM, DIAGNOSTICURETEROSCOPY AND STENT PLACEMENT;  Surgeon: Sebastian Ache, MD;  Location: [facility];  Service: Urology;  Laterality: Left;  1 HR   EXTRACORPOREAL SHOCK WAVE LITHOTRIPSY     approx 2002   LUMBAR DISC SURGERY  2018   LUMBAR LAMINECTOMY/DECOMPRESSION MICRODISCECTOMY Right 08/31/2022   Procedure: Redo foraminotomy right Lumbar four-five;  Surgeon: Donalee Citrin, MD;  Location: [facility] OR;  Service: Neurosurgery;  Laterality: Right;   REPAIR DURAL / CSF LEAK  2018   post op lumbar diskectomy   REVISION TOTAL KNEE ARTHROPLASTY Left 2015   TOTAL KNEE ARTHROPLASTY Left 2012   TRANSFORAMINAL LUMBAR INTERBODY FUSION (TLIF) WITH PEDICLE SCREW FIXATION 1 LEVEL N/A 06/12/2019   Procedure: LUMBAR FOUR-FIVE TRANSFORAMINAL LUMBAR INTERBODY FUSION (TLIF) WITH PEDICLE SCREW FIXATION;  Surgeon: Donalee Citrin, MD;  Location: [facility] OR;  Service: Neurosurgery;  Laterality: N/A;   UMBILICAL HERNIA REPAIR  1992    Social History   Tobacco Use   Smoking status: Never   Smokeless tobacco: Former    Types: Chew    Quit date: 03/2020  Vaping Use   Vaping Use: Never used  Substance Use Topics   Alcohol use: Yes    Comment: ocassional   Drug use: Never     Medication list has been reviewed and updated.  Current Meds  Medication Sig   acetaminophen (TYLENOL) 325 MG tablet Take 650 mg by mouth every 6 (six) hours as needed for moderate pain.   albuterol (VENTOLIN HFA) 108 (90 Base) MCG/ACT inhaler Inhale 2 puffs into the lungs every 6 (six) hours as needed for wheezing  or shortness of breath.   apixaban (ELIQUIS) 5 MG TABS tablet Take 1 tablet (5 mg total) by mouth 2 (two) times daily. Restart Wednesday, November 16   celecoxib (CELEBREX) 200 MG capsule Take 200 mg by mouth daily.   cyclobenzaprine (FLEXERIL) 5 MG tablet Take 5 mg by mouth 3 (three) times daily as needed.   diltiazem (CARDIZEM CD) 180 MG 24 hr capsule Take 1 capsule (180 mg total) by mouth 2 (two) times daily.   doxycycline (VIBRA-TABS) 100 MG tablet Take 1 tablet (100 mg total) by mouth 2 (two) times daily.   ferrous sulfate 325 (65 FE) MG tablet Take 325 mg by mouth daily with breakfast.   fexofenadine (ALLEGRA) 180 MG tablet Take 1 tablet (180 mg total) by mouth daily.   fluticasone (FLONASE) 50 MCG/ACT nasal spray USE 2 SPRAY(S) IN EACH NOSTRIL ONCE DAILY AS NEEDED FOR ALLERGIES   guaiFENesin-codeine (ROBITUSSIN AC) 100-10 MG/5ML syrup Take 5 mLs by mouth 3 (three) times daily as needed for cough.   hydrochlorothiazide (HYDRODIURIL) 12.5 MG tablet Take 1 tablet (12.5 mg total) by mouth daily.   KLOR-CON M20 20 MEQ tablet TAKE  1 TO 2 TABLETS BY MOUTH TWICE DAILY IN THE MORNING AND IN THE EVENING   metoprolol succinate (TOPROL-XL) 50 MG 24 hr tablet Take 1 tablet by mouth daily. cardio   montelukast (SINGULAIR) 10 MG tablet Take 1 tablet (10 mg total) by mouth at bedtime.   Multiple Vitamins-Minerals (MULTIVITAMIN MEN) TABS Take 1 tablet by mouth daily.   pantoprazole (PROTONIX) 40 MG tablet Take 1 tablet (40 mg total) by mouth every evening.   predniSONE (DELTASONE) 10 MG tablet Take 1 tablet (10 mg total) by mouth daily with breakfast.   rosuvastatin (CRESTOR) 5 MG tablet TAKE 1 TABLET BY MOUTH THREE TIMES A WEEK   tadalafil (CIALIS) 5 MG tablet Take 1-2 tablets (5-10 mg total) by mouth daily as needed for erectile dysfunction.   tamsulosin (FLOMAX) 0.4 MG CAPS capsule Take 1 capsule (0.4 mg total) by mouth daily.   valsartan (DIOVAN) 320 MG tablet Take 1 tablet (320 mg total) by mouth  daily.       03/06/2023    3:09 PM 02/23/2023    4:05 PM 01/20/2023    3:02 PM 01/13/2023    1:16 PM  GAD 7 : Generalized Anxiety Score  Nervous, Anxious, on Edge 0 0 0 0  Control/stop worrying 0 0 0 0  Worry too much - different things 0 0 0 0  Trouble relaxing 0 0 0 0  Restless 0 0 0 0  Easily annoyed or irritable 0 0 0 0  Afraid - awful might happen 0 0 0 0  Total GAD 7 Score 0 0 0 0  Anxiety Difficulty Not difficult at all Not difficult at all Not difficult at all Not difficult at all       03/06/2023    3:09 PM 02/23/2023    4:05 PM 01/20/2023    3:02 PM  Depression screen PHQ 2/9  Decreased Interest 0 0 0  Down, Depressed, Hopeless 0 0 0  PHQ - 2 Score 0 0 0  Altered sleeping 0 0 0  Tired, decreased energy 0 0 0  Change in appetite 0 0 0  Feeling bad or failure about yourself  0 0 0  Trouble concentrating 0 0 0  Moving slowly or fidgety/restless 0 0 0  Suicidal thoughts 0 0 0  PHQ-9 Score 0 0 0  Difficult doing work/chores Not difficult at all Not difficult at all Not difficult at all    BP Readings from Last 3 Encounters:  03/06/23 122/64  03/01/23 128/76  02/23/23 124/76    Physical Exam Vitals and nursing note reviewed.  HENT:     Head: Normocephalic.     Right Ear: Tympanic membrane and external ear normal.     Left Ear: Tympanic membrane and external ear normal.     Nose: Nose normal.  Eyes:     General: No scleral icterus.       Right eye: No discharge.        Left eye: No discharge.     Conjunctiva/sclera: Conjunctivae normal.     Pupils: Pupils are equal, round, and reactive to light.  Neck:     Thyroid: No thyromegaly.     Vascular: No JVD.     Trachea: No tracheal deviation.  Cardiovascular:     Rate and Rhythm: Normal rate and regular rhythm.     Heart sounds: Normal heart sounds. No murmur heard.    No friction rub. No gallop.  Pulmonary:     Effort: No respiratory  distress.     Breath sounds: Decreased air movement present. Wheezing and  rhonchi present. No decreased breath sounds or rales.  Abdominal:     General: Bowel sounds are normal.     Palpations: Abdomen is soft. There is no mass.     Tenderness: There is no abdominal tenderness. There is no guarding or rebound.  Musculoskeletal:        General: No tenderness. Normal range of motion.     Cervical back: Normal range of motion and neck supple.  Lymphadenopathy:     Cervical: No cervical adenopathy.  Skin:    General: Skin is warm.     Findings: No rash.  Neurological:     Mental Status: He is alert.     Wt Readings from Last 3 Encounters:  03/06/23 247 lb (112 kg)  03/01/23 246 lb (111.6 kg)  02/23/23 244 lb (110.7 kg)    BP 122/64   Pulse 64   Ht 6\' 1"  (1.854 m)   Wt 247 lb (112 kg)   SpO2 98%   BMI 32.59 kg/m   Assessment and Plan:  1. Chronic sinusitis with recurrent bronchitis Recurrent sinusitis and recurrent bronchitis usually when exposed to multiple individuals and a meeting capacity.  Chronic.  Recurrent.  Stable.  Continues to have cough after being on doxycycline for other concern.  Patient continues to have a purulent cough and rhonchi on auscultation.  We will proceed with antibiotic coverage with azithromycin in case there is an atypical and increased steroid starting with 40 mg taper over 12 days, and patient has been given another sample of Breztri to take in addition to his every 6 hour albuterol inhaler.  2. Mild intermittent reactive airway disease without complication See above   Elizabeth Sauer, MD

## 2023-03-06 NOTE — Telephone Encounter (Signed)
Copied from CRM [id number]. Topic: General - Other >> Mar 06, 2023  3:35 PM Macon Large wrote: Reason for CRM: April with [facility] stated that there is some confusing regarding the referral. April stated that they are accepting patients from Dr. Yetta Barre but they like to speak with the pt to schedule the appt. April requests call back at [phone]. April asked that when the call is returned to please ask to speak with her or Joyce Gross.

## 2023-03-06 NOTE — Telephone Encounter (Signed)
  Chief Complaint: cough Symptoms: cough constant, worse at night, productive with green mucus, painful at times Frequency: 2 weeks  Pertinent Negatives: Patient denies SOB or fever Disposition: [] ED /[] Urgent Care (no appt availability in office) / [x] Appointment(In office/virtual)/ []  [place] [facility]/ [] Home Care/ [] Refused Recommended Disposition /[] [place] [facility]/ []  Follow-up with PCP Additional Notes: pt will complete doxycycline that was given on 02/23/23 tomorrow and pt states doesn't feel that improvement with it. Still having cough and Robitussin doesn't help as well. Scheduled pt OV today at 1500 with PCP for FU.      Summary: Cough Advice     Pt is calling to report that he was seen in office on 03/01/23 guaiFENesin-codeine [facility]) 100-10 MG/5ML syrup [[id number]] & doxycycline (VIBRA-TABS) 100 MG tablet [[id number]]. Pt reports sx still include fatigue and cough. Would like to continue on round of antibiotics. Please advise       Reason for Disposition  [1] Continuous (nonstop) coughing interferes with work or school AND [2] no improvement using cough treatment per Care Advice  Answer Assessment - Initial Assessment Questions 1. ONSET: "When did the cough begin?"      2 weeks  3. SPUTUM: "Describe the color of your sputum" (none, dry cough; clear, white, yellow, green)     Greenish mucus  5. DIFFICULTY BREATHING: "Are you having difficulty breathing?" If Yes, ask: "How bad is it?" (e.g., mild, moderate, severe)    - MILD: No SOB at rest, mild SOB with walking, speaks normally in sentences, can lie down, no retractions, pulse < 100.    - MODERATE: SOB at rest, SOB with minimal exertion and prefers to sit, cannot lie down flat, speaks in phrases, mild retractions, audible wheezing, pulse 100-120.    - SEVERE: Very SOB at rest, speaks in single words, struggling to breathe, sitting hunched forward, retractions, pulse > 120      no 6. FEVER: "Do you have a fever?"  If Yes, ask: "What is your temperature, how was it measured, and when did it start?"     no 8. LUNG HISTORY: "Do you have any history of lung disease?"  (e.g., pulmonary embolus, asthma, emphysema)     Has a referral  10. OTHER SYMPTOMS: "Do you have any other symptoms?" (e.g., runny nose, wheezing, chest pain)       Difficulty with sleep d/t cough  Protocols used: Cough - Acute Productive-A-AH

## 2023-03-09 DIAGNOSIS — R0602 Shortness of breath: Secondary | ICD-10-CM | POA: Diagnosis not present

## 2023-03-09 DIAGNOSIS — Z7952 Long term (current) use of systemic steroids: Secondary | ICD-10-CM | POA: Diagnosis not present

## 2023-03-16 DIAGNOSIS — M1711 Unilateral primary osteoarthritis, right knee: Secondary | ICD-10-CM | POA: Diagnosis not present

## 2023-03-21 DIAGNOSIS — Z7952 Long term (current) use of systemic steroids: Secondary | ICD-10-CM | POA: Diagnosis not present

## 2023-03-21 DIAGNOSIS — R0602 Shortness of breath: Secondary | ICD-10-CM | POA: Diagnosis not present

## 2023-03-23 DIAGNOSIS — L300 Nummular dermatitis: Secondary | ICD-10-CM | POA: Diagnosis not present

## 2023-03-23 DIAGNOSIS — L578 Other skin changes due to chronic exposure to nonionizing radiation: Secondary | ICD-10-CM | POA: Diagnosis not present

## 2023-03-23 DIAGNOSIS — Z85828 Personal history of other malignant neoplasm of skin: Secondary | ICD-10-CM | POA: Diagnosis not present

## 2023-03-23 DIAGNOSIS — L853 Xerosis cutis: Secondary | ICD-10-CM | POA: Diagnosis not present

## 2023-03-23 DIAGNOSIS — L570 Actinic keratosis: Secondary | ICD-10-CM | POA: Diagnosis not present

## 2023-03-23 DIAGNOSIS — Z872 Personal history of diseases of the skin and subcutaneous tissue: Secondary | ICD-10-CM | POA: Diagnosis not present

## 2023-03-23 DIAGNOSIS — Z859 Personal history of malignant neoplasm, unspecified: Secondary | ICD-10-CM | POA: Diagnosis not present

## 2023-03-24 ENCOUNTER — Encounter: Payer: Self-pay | Admitting: Family Medicine

## 2023-03-24 ENCOUNTER — Other Ambulatory Visit
Admission: RE | Admit: 2023-03-24 | Discharge: 2023-03-24 | Disposition: A | Discharge status: Home / Self Care | Payer: Medicare PPO | Attending: Family Medicine | Admitting: Family Medicine

## 2023-03-24 ENCOUNTER — Ambulatory Visit (INDEPENDENT_AMBULATORY_CARE_PROVIDER_SITE_OTHER): Payer: Medicare PPO | Admitting: Family Medicine

## 2023-03-24 VITALS — BP 126/72 | HR 77 | Ht 73.0 in | Wt 244.0 lb

## 2023-03-24 DIAGNOSIS — M791 Myalgia, unspecified site: Secondary | ICD-10-CM

## 2023-03-24 DIAGNOSIS — M790 Rheumatism, unspecified: Secondary | ICD-10-CM | POA: Insufficient documentation

## 2023-03-24 DIAGNOSIS — E860 Dehydration: Secondary | ICD-10-CM

## 2023-03-24 LAB — RENAL FUNCTION PANEL
Albumin: 3.9 g/dL (ref 3.5–5.0)
Anion gap: 7 (ref 5–15)
BUN: 16 mg/dL (ref 8–23)
CO2: 23 mmol/L (ref 22–32)
Calcium: 8.9 mg/dL (ref 8.9–10.3)
Chloride: 103 mmol/L (ref 98–111)
Creatinine, Ser: 1.07 mg/dL (ref 0.61–1.24)
GFR, Estimated: >60 mL/min (ref >60)
Glucose, Bld: 110 mg/dL — ABNORMAL HIGH (ref 70–99)
Phosphorus: 3.9 mg/dL (ref 2.5–4.6)
Potassium: 4 mmol/L (ref 3.5–5.1)
Sodium: 133 mmol/L — ABNORMAL LOW (ref 135–145)

## 2023-03-24 NOTE — Progress Notes (Signed)
Date:  03/24/2023   Name:  [name]   DOB:  10-30-1955   MRN:  [id number]   Chief Complaint: Dysmenorrhea (All over body from neck to finger)  Muscle Pain This is a new problem. The current episode started in the past 7 days. The problem occurs 2 to 4 times per day. Pertinent negatives include no abdominal pain, chest pain, fatigue, fever, shortness of breath or wheezing.    Lab Results  Component Value Date   NA 141 08/22/2022   K 3.7 08/22/2022   CO2 27 08/22/2022   GLUCOSE 140 (H) 08/22/2022   BUN 13 08/22/2022   CREATININE 1.03 08/22/2022   CALCIUM 9.3 08/22/2022   EGFR 85 02/21/2022   GFRNONAA >60 08/22/2022   Lab Results  Component Value Date   CHOL 134 03/01/2023   HDL 40 03/01/2023   LDLCALC 78 03/01/2023   TRIG 84 03/01/2023   CHOLHDL 4.1 08/07/2019   Lab Results  Component Value Date   TSH 0.569 02/21/2022   Lab Results  Component Value Date   HGBA1C 6.4 (H) 03/01/2023   Lab Results  Component Value Date   WBC 5.0 02/23/2023   HGB 15.2 02/23/2023   HCT 44.7 02/23/2023   MCV 83.6 02/23/2023   PLT 197 02/23/2023   Lab Results  Component Value Date   ALT 16 06/15/2022   AST 13 06/15/2022   ALKPHOS 96 06/15/2022   BILITOT 0.4 06/15/2022   No results found for: "25OHVITD2", "25OHVITD3", "VD25OH"   Review of Systems  Constitutional:  Negative for chills, diaphoresis, fatigue and fever.  Respiratory:  Negative for cough, choking, shortness of breath and wheezing.   Cardiovascular:  Negative for chest pain, palpitations and leg swelling.  Gastrointestinal:  Negative for abdominal pain.  Endocrine: Negative for polydipsia and polyuria.  Musculoskeletal:  Positive for myalgias. Negative for arthralgias.    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   Hypokalemia 08/24/2021   Bacteremia due to Gram-negative bacteria    AF (paroxysmal atrial fibrillation) (HCC)    SIRS (systemic  inflammatory response syndrome) (HCC) 07/27/2021   Sepsis secondary to UTI (HCC) 07/27/2021   Ureteral stricture 06/18/2021   Sacroiliitis (HCC) 05/07/2021   Spinal stenosis of lumbosacral region 01/18/2021   Herniated nucleus pulposus, lumbar 03/16/2020   Body mass index (BMI) 33.0-33.9, adult 09/03/2019   Other intervertebral disc displacement, lumbar region 06/25/2019   HNP (herniated nucleus pulposus), lumbar 06/12/2019   UTI due to Klebsiella species 05/08/2019   Biceps tendonitis on right 07/27/2018   Spinal stenosis, 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   Lumbago with sciatica, unspecified side 11/14/2017   Primary osteoarthritis of right knee 10/12/2017   Taking multiple medications for chronic disease 07/30/2015   Reactive airway disease 07/01/2015   Allergic sinusitis 07/01/2015   Essential hypertension 07/01/2015   Valvular heart disease 07/01/2015   Metatarsalgia of left foot 06/10/2015   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    Allergies  Allergen Reactions   Meloxicam Palpitations   Sulfa Antibiotics Rash    Past Surgical History:  Procedure Laterality Date   ABDOMINAL EXPOSURE N/A 01/18/2021   Procedure: ABDOMINAL EXPOSURE;  Surgeon: Cephus Shelling, MD;  Location: [facility] OR;  Service: Vascular;  Laterality: N/A;   ANTERIOR LUMBAR FUSION N/A 01/18/2021   Procedure: Anterior Lumbar Interbody Fusion - Lumbar Five-Sarcal One;  Surgeon: Donalee Citrin, MD;  Location: [facility] OR;  Service: Neurosurgery;  Laterality: N/A;  Anterior Lumbar Interbody Fusion - Lumbar Five-Sacral One   CYSTOSCOPY W/ URETERAL STENT PLACEMENT Left 06/18/2021   Procedure: CYSTOSCOPY WITH RETROGRADE PYELOGRAM/URETERAL STENT EXCHANGE;  Surgeon: Sebastian Ache, MD;  Location: [facility] ORS;  Service: Urology;  Laterality: Left;   CYSTOSCOPY WITH RETROGRADE PYELOGRAM, URETEROSCOPY AND STENT PLACEMENT Left 04/21/2021   Procedure: CYSTOSCOPY WITH RETROGRADE PYELOGRAM, DIAGNOSTICURETEROSCOPY AND STENT PLACEMENT;   Surgeon: Sebastian Ache, MD;  Location: [facility];  Service: Urology;  Laterality: Left;  1 HR   EXTRACORPOREAL SHOCK WAVE LITHOTRIPSY     approx 2002   LUMBAR DISC SURGERY  2018   LUMBAR LAMINECTOMY/DECOMPRESSION MICRODISCECTOMY Right 08/31/2022   Procedure: Redo foraminotomy right Lumbar four-five;  Surgeon: Donalee Citrin, MD;  Location: [facility] OR;  Service: Neurosurgery;  Laterality: Right;   REPAIR DURAL / CSF LEAK  2018   post op lumbar diskectomy   REVISION TOTAL KNEE ARTHROPLASTY Left 2015   TOTAL KNEE ARTHROPLASTY Left 2012   TRANSFORAMINAL LUMBAR INTERBODY FUSION (TLIF) WITH PEDICLE SCREW FIXATION 1 LEVEL N/A 06/12/2019   Procedure: LUMBAR FOUR-FIVE TRANSFORAMINAL LUMBAR INTERBODY FUSION (TLIF) WITH PEDICLE SCREW FIXATION;  Surgeon: Donalee Citrin, MD;  Location: [facility] OR;  Service: Neurosurgery;  Laterality: N/A;   UMBILICAL HERNIA REPAIR  1992    Social History   Tobacco Use   Smoking status: Never   Smokeless tobacco: Former    Types: Chew    Quit date: 03/2020  Vaping Use   Vaping Use: Never used  Substance Use Topics   Alcohol use: Yes    Comment: ocassional   Drug use: Never     Medication list has been reviewed and updated.  Current Meds  Medication Sig   acetaminophen (TYLENOL) 325 MG tablet Take 650 mg by mouth every 6 (six) hours as needed for moderate pain.   albuterol (VENTOLIN HFA) 108 (90 Base) MCG/ACT inhaler Inhale 2 puffs into the lungs every 6 (six) hours as needed for wheezing or shortness of breath.   apixaban (ELIQUIS) 5 MG TABS tablet Take 1 tablet (5 mg total) by mouth 2 (two) times daily. Restart Wednesday, November 16   celecoxib (CELEBREX) 200 MG capsule Take 200 mg by mouth daily.   cyclobenzaprine (FLEXERIL) 5 MG tablet Take 5 mg by mouth 3 (three) times daily as needed.   diltiazem (CARDIZEM CD) 180 MG 24 hr capsule Take 1 capsule (180 mg total) by mouth 2 (two) times daily.   ferrous sulfate 325 (65 FE) MG tablet Take 325 mg by mouth  daily with breakfast.   fexofenadine (ALLEGRA) 180 MG tablet Take 1 tablet (180 mg total) by mouth daily.   fluticasone (FLONASE) 50 MCG/ACT nasal spray USE 2 SPRAY(S) IN EACH NOSTRIL ONCE DAILY AS NEEDED FOR ALLERGIES   hydrochlorothiazide (HYDRODIURIL) 12.5 MG tablet Take 1 tablet (12.5 mg total) by mouth daily.   KLOR-CON M20 20 MEQ tablet TAKE 1 TO 2 TABLETS BY MOUTH TWICE DAILY IN THE MORNING AND IN THE EVENING   metoprolol succinate (TOPROL-XL) 50 MG 24 hr tablet Take 1 tablet by mouth daily. cardio   montelukast (SINGULAIR) 10 MG tablet Take 1 tablet (10 mg total) by mouth at bedtime.   Multiple Vitamins-Minerals (MULTIVITAMIN MEN) TABS Take 1 tablet by mouth daily.   pantoprazole (PROTONIX) 40 MG tablet Take 1 tablet (40 mg total) by mouth every evening.   rosuvastatin (CRESTOR) 5 MG tablet TAKE 1 TABLET BY MOUTH THREE TIMES A WEEK   spironolactone (ALDACTONE) 50 MG tablet Take by mouth.  tadalafil (CIALIS) 5 MG tablet Take 1-2 tablets (5-10 mg total) by mouth daily as needed for erectile dysfunction.   tamsulosin (FLOMAX) 0.4 MG CAPS capsule Take 1 capsule (0.4 mg total) by mouth daily.   valsartan (DIOVAN) 320 MG tablet Take 1 tablet (320 mg total) by mouth daily.   [DISCONTINUED] doxycycline (VIBRA-TABS) 100 MG tablet Take 1 tablet (100 mg total) by mouth 2 (two) times daily.   [DISCONTINUED] guaiFENesin-codeine (ROBITUSSIN AC) 100-10 MG/5ML syrup Take 5 mLs by mouth 3 (three) times daily as needed for cough.       03/06/2023    3:09 PM 02/23/2023    4:05 PM 01/20/2023    3:02 PM 01/13/2023    1:16 PM  GAD 7 : Generalized Anxiety Score  Nervous, Anxious, on Edge 0 0 0 0  Control/stop worrying 0 0 0 0  Worry too much - different things 0 0 0 0  Trouble relaxing 0 0 0 0  Restless 0 0 0 0  Easily annoyed or irritable 0 0 0 0  Afraid - awful might happen 0 0 0 0  Total GAD 7 Score 0 0 0 0  Anxiety Difficulty Not difficult at all Not difficult at all Not difficult at all Not  difficult at all       03/06/2023    3:09 PM 02/23/2023    4:05 PM 01/20/2023    3:02 PM  Depression screen PHQ 2/9  Decreased Interest 0 0 0  Down, Depressed, Hopeless 0 0 0  PHQ - 2 Score 0 0 0  Altered sleeping 0 0 0  Tired, decreased energy 0 0 0  Change in appetite 0 0 0  Feeling bad or failure about yourself  0 0 0  Trouble concentrating 0 0 0  Moving slowly or fidgety/restless 0 0 0  Suicidal thoughts 0 0 0  PHQ-9 Score 0 0 0  Difficult doing work/chores Not difficult at all Not difficult at all Not difficult at all    BP Readings from Last 3 Encounters:  03/24/23 126/72  03/06/23 122/64  03/01/23 128/76    Physical Exam Vitals and nursing note reviewed.  HENT:     Head: Normocephalic.     Right Ear: Tympanic membrane and external ear normal.     Left Ear: Tympanic membrane and external ear normal.     Nose: Nose normal. No congestion or rhinorrhea.     Mouth/Throat:     Mouth: Mucous membranes are moist.  Eyes:     General: No scleral icterus.       Right eye: No discharge.        Left eye: No discharge.     Conjunctiva/sclera: Conjunctivae normal.     Pupils: Pupils are equal, round, and reactive to light.  Neck:     Thyroid: No thyromegaly.     Vascular: No JVD.     Trachea: No tracheal deviation.  Cardiovascular:     Rate and Rhythm: Normal rate and regular rhythm.     Heart sounds: Normal heart sounds. No murmur heard.    No friction rub. No gallop.  Pulmonary:     Effort: No respiratory distress.     Breath sounds: Normal breath sounds. No wheezing, rhonchi or rales.  Abdominal:     General: Bowel sounds are normal.     Palpations: Abdomen is soft. There is no mass.     Tenderness: There is no abdominal tenderness. There is no guarding or rebound.  Musculoskeletal:        General: No tenderness. Normal range of motion.     Cervical back: Normal range of motion and neck supple.  Lymphadenopathy:     Cervical: No cervical adenopathy.  Skin:     General: Skin is warm.     Findings: No rash.  Neurological:     Mental Status: He is alert and oriented to person, place, and time.     Cranial Nerves: No cranial nerve deficit.     Deep Tendon Reflexes: Reflexes are normal and symmetric.     Wt Readings from Last 3 Encounters:  03/24/23 244 lb (110.7 kg)  03/06/23 247 lb (112 kg)  03/01/23 246 lb (111.6 kg)    BP 126/72   Pulse 77   Ht 6\' 1"  (1.854 m)   Wt 244 lb (110.7 kg)   SpO2 95%   BMI 32.19 kg/m   Assessment and Plan: Patient recently  evaluated by pulmonary with pulmonary function testing that by history of patient was in the 90th percentile.  This is encouraging and patient was given spironolactone because he has some lower leg edema and thought that this was probably of some cardiac/pulmonary edema etiology. 1. Dehydration New onset.  Patient has been taking this significant amount of fluids in addition to electrolytes in the form of Gatorade.  Patient was recently started on spironolactone along with his hydrochlorothiazide and potassium was discontinued.  Review of CMP from 72 hours ago noted lower limits of normal sodium potassium and calcium.  I encouraged patient to hold the spironolactone for at least another week we will obtain an electrolyte panel to look at sodium and potassium as well as continue hydration with electrolyte replacement fluids.  Since patient is back on hydrochlorothiazide I have also suggested that he resume his potassium supplementation. - Electrolyte panel  2. Myalgia New onset.  Especially in multiple areas of cramping including the masseter forearm finger (sounded like trigger finger) and lower legs.  Review of history notes that patient had issues with statins in the past he is currently taking rosuvastatin 5 mg 3 times a week this may be accentuated some with the dehydration perhaps the spironolactone and I have instructed him to stop his rosuvastatin for the time being.  In addition to looking  electrolytes we will get a sed rate to make sure there is not a polymyositis issue. - Sed Rate (ESR)   Next step scenario if things improved I would probably perhaps switch out hydrochlorothiazide with spironolactone which would also necessitate discontinuance of potassium as well.  I will be reluctant to restart rosuvastatin if pain does not resolve and if I do so it will be at a lower dosing perhaps once a week. Elizabeth Sauer, MD

## 2023-04-03 DIAGNOSIS — I4891 Unspecified atrial fibrillation: Secondary | ICD-10-CM | POA: Diagnosis not present

## 2023-04-03 DIAGNOSIS — Z800 Family history of malignant neoplasm of digestive organs: Secondary | ICD-10-CM | POA: Diagnosis not present

## 2023-04-03 DIAGNOSIS — Z86010 Personal history of colonic polyps: Secondary | ICD-10-CM | POA: Diagnosis not present

## 2023-04-21 ENCOUNTER — Other Ambulatory Visit: Payer: Self-pay | Admitting: Family Medicine

## 2023-04-21 DIAGNOSIS — E876 Hypokalemia: Secondary | ICD-10-CM

## 2023-04-25 DIAGNOSIS — N131 Hydronephrosis with ureteral stricture, not elsewhere classified: Secondary | ICD-10-CM | POA: Diagnosis not present

## 2023-05-22 ENCOUNTER — Ambulatory Visit: Payer: Medicare PPO

## 2023-05-22 DIAGNOSIS — K640 First degree hemorrhoids: Secondary | ICD-10-CM | POA: Diagnosis not present

## 2023-05-22 DIAGNOSIS — Z1211 Encounter for screening for malignant neoplasm of colon: Secondary | ICD-10-CM | POA: Diagnosis not present

## 2023-05-22 DIAGNOSIS — K5730 Diverticulosis of large intestine without perforation or abscess without bleeding: Secondary | ICD-10-CM | POA: Diagnosis not present

## 2023-05-22 DIAGNOSIS — Z09 Encounter for follow-up examination after completed treatment for conditions other than malignant neoplasm: Secondary | ICD-10-CM | POA: Diagnosis not present

## 2023-05-22 DIAGNOSIS — D120 Benign neoplasm of cecum: Secondary | ICD-10-CM | POA: Diagnosis not present

## 2023-05-22 DIAGNOSIS — D126 Benign neoplasm of colon, unspecified: Secondary | ICD-10-CM | POA: Diagnosis not present

## 2023-05-22 DIAGNOSIS — Z86010 Personal history of colonic polyps: Secondary | ICD-10-CM | POA: Diagnosis not present

## 2023-05-22 LAB — HM COLONOSCOPY

## 2023-05-23 DIAGNOSIS — I38 Endocarditis, valve unspecified: Secondary | ICD-10-CM | POA: Diagnosis not present

## 2023-05-23 DIAGNOSIS — E782 Mixed hyperlipidemia: Secondary | ICD-10-CM | POA: Diagnosis not present

## 2023-05-23 DIAGNOSIS — I77819 Aortic ectasia, unspecified site: Secondary | ICD-10-CM | POA: Diagnosis not present

## 2023-05-23 DIAGNOSIS — I10 Essential (primary) hypertension: Secondary | ICD-10-CM | POA: Diagnosis not present

## 2023-05-23 DIAGNOSIS — I480 Paroxysmal atrial fibrillation: Secondary | ICD-10-CM | POA: Diagnosis not present

## 2023-05-23 DIAGNOSIS — E876 Hypokalemia: Secondary | ICD-10-CM | POA: Diagnosis not present

## 2023-05-25 ENCOUNTER — Other Ambulatory Visit: Payer: Self-pay | Admitting: Family Medicine

## 2023-05-25 DIAGNOSIS — J302 Other seasonal allergic rhinitis: Secondary | ICD-10-CM

## 2023-05-25 MED ORDER — FEXOFENADINE HCL 180 MG PO TABS
180.0000 mg | ORAL_TABLET | Freq: Every day | ORAL | 1 refills | Status: DC
Start: 2023-05-25 — End: 2023-09-15

## 2023-05-25 NOTE — Telephone Encounter (Signed)
Requested Prescriptions  Pending Prescriptions Disp Refills   fexofenadine (ALLEGRA) 180 MG tablet 90 tablet 1    Sig: Take 1 tablet (180 mg total) by mouth daily.     Ear, Nose, and Throat:  Antihistamines Passed - 05/25/2023 12:34 PM      Passed - Valid encounter within last 12 months    Recent Outpatient Visits           2 months ago Dehydration   [facility]   2 months ago Chronic sinusitis with recurrent bronchitis   [facility]   2 months ago Essential hypertension   [place]   3 months ago Mild intermittent reactive airway disease with acute exacerbation   [place] [facility]   4 months ago Acute maxillary sinusitis, recurrence not specified   [facility]       Future Appointments             In 3 months Duanne Limerick, MD [facility] at [facility], [facility]   In 4 months Richardo Hanks, Laurette Schimke, MD [facility] [place]

## 2023-05-25 NOTE — Telephone Encounter (Signed)
[facility] called and spoke to [place], Pensions consultant about the refill(s) fexofenadine requested. Advised it was sent on 03/01/23 #90/1 refill(s). She says there is no record of it on file, but it may have been cancelled due to insurance will not cover it, since OTC medication. I asked is the patient notified of this. She says typically no, but she will be on the lookout for it and call the patient to let him know if it's not paid for.

## 2023-05-25 NOTE — Telephone Encounter (Signed)
Medication Refill - Medication:fexofenadine (ALLEGRA) 180 MG tablet  Has the patient contacted their pharmacy? Yes.     (Preferred Pharmacy (with phone number or street name): [facility] [address], [place] - [address]  [address] [place] [address]  Phone:  [phone]  Fax:  [phone]      Has the patient been seen for an appointment in the last year OR does the patient have an upcoming appointment? Yes.    Agent: Please be advised that RX refills may take up to 3 business days. We ask that you follow-up with your pharmacy.

## 2023-06-05 DIAGNOSIS — J811 Chronic pulmonary edema: Secondary | ICD-10-CM | POA: Diagnosis not present

## 2023-06-20 DIAGNOSIS — L570 Actinic keratosis: Secondary | ICD-10-CM | POA: Diagnosis not present

## 2023-06-27 DIAGNOSIS — S32009K Unspecified fracture of unspecified lumbar vertebra, subsequent encounter for fracture with nonunion: Secondary | ICD-10-CM | POA: Diagnosis not present

## 2023-07-04 DIAGNOSIS — L570 Actinic keratosis: Secondary | ICD-10-CM | POA: Diagnosis not present

## 2023-07-18 DIAGNOSIS — L570 Actinic keratosis: Secondary | ICD-10-CM | POA: Diagnosis not present

## 2023-07-24 DIAGNOSIS — M47816 Spondylosis without myelopathy or radiculopathy, lumbar region: Secondary | ICD-10-CM | POA: Diagnosis not present

## 2023-07-24 DIAGNOSIS — M5136 Other intervertebral disc degeneration, lumbar region: Secondary | ICD-10-CM | POA: Diagnosis not present

## 2023-07-24 DIAGNOSIS — M5416 Radiculopathy, lumbar region: Secondary | ICD-10-CM | POA: Diagnosis not present

## 2023-07-25 ENCOUNTER — Other Ambulatory Visit: Payer: Self-pay | Admitting: Family Medicine

## 2023-07-25 DIAGNOSIS — E876 Hypokalemia: Secondary | ICD-10-CM

## 2023-09-01 ENCOUNTER — Ambulatory Visit: Payer: Medicare PPO | Admitting: Family Medicine

## 2023-09-01 ENCOUNTER — Encounter: Payer: Self-pay | Admitting: Family Medicine

## 2023-09-01 VITALS — BP 112/70 | HR 79 | Temp 98.4°F | Ht 73.0 in | Wt 249.0 lb

## 2023-09-01 DIAGNOSIS — J0100 Acute maxillary sinusitis, unspecified: Secondary | ICD-10-CM

## 2023-09-01 DIAGNOSIS — R051 Acute cough: Secondary | ICD-10-CM

## 2023-09-01 MED ORDER — AMOXICILLIN-POT CLAVULANATE 875-125 MG PO TABS
1.0000 | ORAL_TABLET | Freq: Two times a day (BID) | ORAL | 0 refills | Status: DC
Start: 2023-09-01 — End: 2023-09-15

## 2023-09-01 MED ORDER — GUAIFENESIN-CODEINE 100-10 MG/5ML PO SYRP
5.0000 mL | ORAL_SOLUTION | Freq: Three times a day (TID) | ORAL | 0 refills | Status: DC | PRN
Start: 2023-09-01 — End: 2023-11-01

## 2023-09-01 NOTE — Progress Notes (Signed)
Date:  09/01/2023   Name:  [name]   DOB:  09/10/1956   MRN:  [id number]   Chief Complaint: Cough (Congestion, cough, and fever. Started Monday. Taking tylenol and mucinex. Negative covid test.)  Sinusitis This is a chronic problem. The current episode started more than 1 year ago. The problem has been gradually improving since onset. The maximum temperature recorded prior to his arrival was 100.4 - 100.9 F. The pain is moderate. Pertinent negatives include no chills, congestion, coughing, diaphoresis, ear pain, headaches, hoarse voice, neck pain, shortness of breath, sinus pressure, sneezing, sore throat or swollen glands. Past treatments include nothing. The treatment provided moderate relief.    Lab Results  Component Value Date   NA 133 (L) 03/24/2023   K 4.0 03/24/2023   CO2 23 03/24/2023   GLUCOSE 110 (H) 03/24/2023   BUN 16 03/24/2023   CREATININE 1.07 03/24/2023   CALCIUM 8.9 03/24/2023   EGFR 85 02/21/2022   GFRNONAA >60 03/24/2023   Lab Results  Component Value Date   CHOL 134 03/01/2023   HDL 40 03/01/2023   LDLCALC 78 03/01/2023   TRIG 84 03/01/2023   CHOLHDL 4.1 08/07/2019   Lab Results  Component Value Date   TSH 0.569 02/21/2022   Lab Results  Component Value Date   HGBA1C 6.4 (H) 03/01/2023   Lab Results  Component Value Date   WBC 5.0 02/23/2023   HGB 15.2 02/23/2023   HCT 44.7 02/23/2023   MCV 83.6 02/23/2023   PLT 197 02/23/2023   Lab Results  Component Value Date   ALT 16 06/15/2022   AST 13 06/15/2022   ALKPHOS 96 06/15/2022   BILITOT 0.4 06/15/2022   No results found for: "25OHVITD2", "25OHVITD3", "VD25OH"   Review of Systems  Constitutional:  Negative for chills and diaphoresis.  HENT:  Positive for postnasal drip, rhinorrhea and sinus pain. Negative for congestion, ear pain, hoarse voice, nosebleeds, sinus pressure, sneezing and sore throat.   Respiratory:  Positive for wheezing. Negative for cough, chest tightness and  shortness of breath.   Cardiovascular:  Negative for chest pain and palpitations.  Musculoskeletal:  Negative for neck pain.  Neurological:  Negative for headaches.    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   Hypokalemia 08/24/2021   Bacteremia due to Gram-negative bacteria    AF (paroxysmal atrial fibrillation) (HCC)    SIRS (systemic inflammatory response syndrome) (HCC) 07/27/2021   Sepsis secondary to UTI (HCC) 07/27/2021   Ureteral stricture 06/18/2021   Sacroiliitis (HCC) 05/07/2021   Spinal stenosis of lumbosacral region 01/18/2021   Herniated nucleus pulposus, lumbar 03/16/2020   Body mass index (BMI) 33.0-33.9, adult 09/03/2019   Other intervertebral disc displacement, lumbar region 06/25/2019   HNP (herniated nucleus pulposus), lumbar 06/12/2019   UTI due to Klebsiella species 05/08/2019   Biceps tendonitis on right 07/27/2018   Spinal stenosis, 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   Lumbago with sciatica, unspecified side 11/14/2017   Primary osteoarthritis of right knee 10/12/2017   Taking multiple medications for chronic disease 07/30/2015   Reactive airway disease 07/01/2015   Allergic sinusitis 07/01/2015   Essential hypertension 07/01/2015   Valvular heart disease 07/01/2015   Metatarsalgia of left foot 06/10/2015   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    Allergies  Allergen Reactions   Meloxicam Palpitations   Sulfa Antibiotics Rash    Past Surgical History:  Procedure Laterality Date   ABDOMINAL  EXPOSURE N/A 01/18/2021   Procedure: ABDOMINAL EXPOSURE;  Surgeon: Cephus Shelling, MD;  Location: [facility] OR;  Service: Vascular;  Laterality: N/A;   ANTERIOR LUMBAR FUSION N/A 01/18/2021   Procedure: Anterior Lumbar Interbody Fusion - Lumbar Five-Sarcal One;  Surgeon: Donalee Citrin, MD;  Location: [facility] OR;  Service: Neurosurgery;  Laterality: N/A;  Anterior Lumbar Interbody Fusion - Lumbar Five-Sacral  One   CYSTOSCOPY W/ URETERAL STENT PLACEMENT Left 06/18/2021   Procedure: CYSTOSCOPY WITH RETROGRADE PYELOGRAM/URETERAL STENT EXCHANGE;  Surgeon: Sebastian Ache, MD;  Location: [facility] ORS;  Service: Urology;  Laterality: Left;   CYSTOSCOPY WITH RETROGRADE PYELOGRAM, URETEROSCOPY AND STENT PLACEMENT Left 04/21/2021   Procedure: CYSTOSCOPY WITH RETROGRADE PYELOGRAM, DIAGNOSTICURETEROSCOPY AND STENT PLACEMENT;  Surgeon: Sebastian Ache, MD;  Location: [facility];  Service: Urology;  Laterality: Left;  1 HR   EXTRACORPOREAL SHOCK WAVE LITHOTRIPSY     approx 2002   LUMBAR DISC SURGERY  2018   LUMBAR LAMINECTOMY/DECOMPRESSION MICRODISCECTOMY Right 08/31/2022   Procedure: Redo foraminotomy right Lumbar four-five;  Surgeon: Donalee Citrin, MD;  Location: [facility] OR;  Service: Neurosurgery;  Laterality: Right;   REPAIR DURAL / CSF LEAK  2018   post op lumbar diskectomy   REVISION TOTAL KNEE ARTHROPLASTY Left 2015   TOTAL KNEE ARTHROPLASTY Left 2012   TRANSFORAMINAL LUMBAR INTERBODY FUSION (TLIF) WITH PEDICLE SCREW FIXATION 1 LEVEL N/A 06/12/2019   Procedure: LUMBAR FOUR-FIVE TRANSFORAMINAL LUMBAR INTERBODY FUSION (TLIF) WITH PEDICLE SCREW FIXATION;  Surgeon: Donalee Citrin, MD;  Location: [facility] OR;  Service: Neurosurgery;  Laterality: N/A;   UMBILICAL HERNIA REPAIR  1992    Social History   Tobacco Use   Smoking status: Never   Smokeless tobacco: Former    Types: Chew    Quit date: 03/2020  Vaping Use   Vaping status: Never Used  Substance Use Topics   Alcohol use: Yes    Comment: ocassional   Drug use: Never     Medication list has been reviewed and updated.  Current Meds  Medication Sig   acetaminophen (TYLENOL) 325 MG tablet Take 650 mg by mouth every 6 (six) hours as needed for moderate pain.   albuterol (VENTOLIN HFA) 108 (90 Base) MCG/ACT inhaler Inhale 2 puffs into the lungs every 6 (six) hours as needed for wheezing or shortness of breath.   apixaban (ELIQUIS) 5 MG TABS tablet Take  1 tablet (5 mg total) by mouth 2 (two) times daily. Restart Wednesday, November 16   celecoxib (CELEBREX) 200 MG capsule Take 200 mg by mouth daily.   cyclobenzaprine (FLEXERIL) 5 MG tablet Take 5 mg by mouth 3 (three) times daily as needed.   diltiazem (CARDIZEM CD) 180 MG 24 hr capsule Take 1 capsule (180 mg total) by mouth 2 (two) times daily.   ferrous sulfate 325 (65 FE) MG tablet Take 325 mg by mouth daily with breakfast.   fexofenadine (ALLEGRA) 180 MG tablet Take 1 tablet (180 mg total) by mouth daily.   fluticasone (FLONASE) 50 MCG/ACT nasal spray USE 2 SPRAY(S) IN EACH NOSTRIL ONCE DAILY AS NEEDED FOR ALLERGIES   Fluticasone-Umeclidin-Vilant (TRELEGY ELLIPTA) 100-62.5-25 MCG/ACT AEPB Inhale into the lungs.   hydrochlorothiazide (HYDRODIURIL) 12.5 MG tablet Take 1 tablet (12.5 mg total) by mouth daily.   methocarbamol (ROBAXIN) 500 MG tablet Take by mouth.   metoprolol succinate (TOPROL-XL) 50 MG 24 hr tablet Take 1 tablet by mouth daily. cardio   montelukast (SINGULAIR) 10 MG tablet Take 1 tablet (10 mg total) by mouth at bedtime.  Multiple Vitamins-Minerals (MULTIVITAMIN MEN) TABS Take 1 tablet by mouth daily.   pantoprazole (PROTONIX) 40 MG tablet Take 1 tablet (40 mg total) by mouth every evening.   potassium chloride SA (KLOR-CON M) 20 MEQ tablet TAKE 1 TO 2  BY MOUTH TWICE DAILY IN THE MORNING AND IN THE EVENING   rosuvastatin (CRESTOR) 5 MG tablet TAKE 1 TABLET BY MOUTH THREE TIMES A WEEK   spironolactone (ALDACTONE) 50 MG tablet Take 50 mg by mouth daily.   tadalafil (CIALIS) 5 MG tablet Take 1-2 tablets (5-10 mg total) by mouth daily as needed for erectile dysfunction.   tamsulosin (FLOMAX) 0.4 MG CAPS capsule Take 1 capsule (0.4 mg total) by mouth daily.   valsartan (DIOVAN) 320 MG tablet Take 1 tablet (320 mg total) by mouth daily.       09/01/2023   10:42 AM 03/06/2023    3:09 PM 02/23/2023    4:05 PM 01/20/2023    3:02 PM  GAD 7 : Generalized Anxiety Score  Nervous,  Anxious, on Edge 0 0 0 0  Control/stop worrying 0 0 0 0  Worry too much - different things 0 0 0 0  Trouble relaxing 0 0 0 0  Restless 0 0 0 0  Easily annoyed or irritable 0 0 0 0  Afraid - awful might happen 0 0 0 0  Total GAD 7 Score 0 0 0 0  Anxiety Difficulty Not difficult at all Not difficult at all Not difficult at all Not difficult at all       09/01/2023   10:41 AM 03/06/2023    3:09 PM 02/23/2023    4:05 PM  Depression screen PHQ 2/9  Decreased Interest 0 0 0  Down, Depressed, Hopeless 0 0 0  PHQ - 2 Score 0 0 0  Altered sleeping 0 0 0  Tired, decreased energy 0 0 0  Change in appetite 0 0 0  Feeling bad or failure about yourself  0 0 0  Trouble concentrating 0 0 0  Moving slowly or fidgety/restless 0 0 0  Suicidal thoughts 0 0 0  PHQ-9 Score 0 0 0  Difficult doing work/chores Not difficult at all Not difficult at all Not difficult at all    BP Readings from Last 3 Encounters:  09/01/23 112/70  03/24/23 126/72  03/06/23 122/64    Physical Exam Vitals and nursing note reviewed.  HENT:     Head: Normocephalic.     Right Ear: Tympanic membrane and external ear normal.     Left Ear: Tympanic membrane and external ear normal.     Nose: Congestion and rhinorrhea present.     Mouth/Throat:     Mouth: Mucous membranes are moist.  Eyes:     General: No scleral icterus.       Right eye: No discharge.        Left eye: No discharge.     Conjunctiva/sclera: Conjunctivae normal.     Pupils: Pupils are equal, round, and reactive to light.  Neck:     Thyroid: No thyromegaly.     Vascular: No JVD.     Trachea: No tracheal deviation.  Cardiovascular:     Rate and Rhythm: Normal rate and regular rhythm.     Heart sounds: Normal heart sounds. No murmur heard.    No friction rub. No gallop.  Pulmonary:     Effort: No respiratory distress.     Breath sounds: Normal breath sounds. No wheezing, rhonchi or rales.  Abdominal:     General: Bowel sounds are normal.      Palpations: Abdomen is soft. There is no mass.     Tenderness: There is no abdominal tenderness. There is no guarding or rebound.     Hernia: No hernia is present.  Musculoskeletal:     Cervical back: Normal range of motion and neck supple.  Lymphadenopathy:     Cervical: No cervical adenopathy.  Neurological:     Mental Status: He is alert.     Deep Tendon Reflexes: Reflexes are normal and symmetric.     Wt Readings from Last 3 Encounters:  09/01/23 249 lb (112.9 kg)  03/24/23 244 lb (110.7 kg)  03/06/23 247 lb (112 kg)    BP 112/70   Pulse 79   Temp 98.4 F (36.9 C) (Oral)   Ht 6\' 1"  (1.854 m)   Wt 249 lb (112.9 kg)   SpO2 97%   BMI 32.85 kg/m   Assessment and Plan: 1. Acute maxillary sinusitis, recurrence not specified New onset.  Persistent periods relatively stable.  But is not resolving with over-the-counter preparations.  Will treat with Augmentin 875 mg twice a day.  Patient has been encouraged to return if not resolving by the early part of next week. - amoxicillin-clavulanate (AUGMENTIN) 875-125 MG tablet; Take 1 tablet by mouth 2 (two) times daily.  Dispense: 20 tablet; Refill: 0  2. Acute cough New onset.  Episodic.  Persistent uncontrolled with over-the-counter preparations.  Will treat with guaifenesin codeine 1 teaspoon every 6 hours as needed cough and will recheck on as-needed basis - guaiFENesin-codeine (ROBITUSSIN AC) 100-10 MG/5ML syrup; Take 5 mLs by mouth 3 (three) times daily as needed for cough.  Dispense: 118 mL; Refill: 0    Elizabeth Sauer, MD

## 2023-09-02 ENCOUNTER — Other Ambulatory Visit: Payer: Self-pay | Admitting: Family Medicine

## 2023-09-02 DIAGNOSIS — J302 Other seasonal allergic rhinitis: Secondary | ICD-10-CM

## 2023-09-11 DIAGNOSIS — L570 Actinic keratosis: Secondary | ICD-10-CM | POA: Diagnosis not present

## 2023-09-15 ENCOUNTER — Ambulatory Visit: Payer: Medicare PPO | Admitting: Family Medicine

## 2023-09-15 ENCOUNTER — Encounter: Payer: Self-pay | Admitting: Family Medicine

## 2023-09-15 VITALS — BP 128/74 | HR 57 | Ht 73.0 in | Wt 251.0 lb

## 2023-09-15 DIAGNOSIS — J302 Other seasonal allergic rhinitis: Secondary | ICD-10-CM

## 2023-09-15 DIAGNOSIS — K219 Gastro-esophageal reflux disease without esophagitis: Secondary | ICD-10-CM | POA: Diagnosis not present

## 2023-09-15 DIAGNOSIS — E782 Mixed hyperlipidemia: Secondary | ICD-10-CM | POA: Diagnosis not present

## 2023-09-15 DIAGNOSIS — E876 Hypokalemia: Secondary | ICD-10-CM

## 2023-09-15 DIAGNOSIS — R601 Generalized edema: Secondary | ICD-10-CM

## 2023-09-15 DIAGNOSIS — I38 Endocarditis, valve unspecified: Secondary | ICD-10-CM

## 2023-09-15 DIAGNOSIS — I10 Essential (primary) hypertension: Secondary | ICD-10-CM

## 2023-09-15 MED ORDER — DOXYCYCLINE HYCLATE 100 MG PO TABS: 100.0000 mg | ORAL_TABLET | Freq: Two times a day (BID) | ORAL | 0 refills | Status: DC

## 2023-09-15 MED ORDER — ROSUVASTATIN CALCIUM 5 MG PO TABS
ORAL_TABLET | ORAL | 1 refills | Status: DC
Start: 2023-09-15 — End: 2024-03-04

## 2023-09-15 MED ORDER — METOPROLOL SUCCINATE ER 50 MG PO TB24: 50.0000 mg | ORAL_TABLET | Freq: Every day | ORAL | 1 refills | Status: DC

## 2023-09-15 MED ORDER — VALSARTAN 320 MG PO TABS
320.0000 mg | ORAL_TABLET | Freq: Every day | ORAL | 1 refills | Status: AC
Start: 2023-09-15 — End: ?

## 2023-09-15 MED ORDER — PANTOPRAZOLE SODIUM 40 MG PO TBEC: 40.0000 mg | DELAYED_RELEASE_TABLET | Freq: Every evening | ORAL | 1 refills | Status: DC

## 2023-09-15 MED ORDER — HYDROCHLOROTHIAZIDE 12.5 MG PO TABS: 12.5000 mg | ORAL_TABLET | Freq: Every day | ORAL | 1 refills | Status: DC

## 2023-09-15 MED ORDER — FEXOFENADINE HCL 180 MG PO TABS: 180.0000 mg | ORAL_TABLET | Freq: Every day | ORAL | 1 refills | Status: DC

## 2023-09-15 MED ORDER — MONTELUKAST SODIUM 10 MG PO TABS
10.0000 mg | ORAL_TABLET | Freq: Every day | ORAL | 1 refills | Status: DC
Start: 2023-09-15 — End: 2024-03-04

## 2023-09-15 MED ORDER — FLUTICASONE PROPIONATE 50 MCG/ACT NA SUSP: 2.0000 | Freq: Every day | NASAL | 5 refills | Status: AC

## 2023-09-15 MED ORDER — DILTIAZEM HCL ER COATED BEADS 180 MG PO CP24: 180.0000 mg | ORAL_CAPSULE | Freq: Two times a day (BID) | ORAL | 1 refills | Status: DC

## 2023-09-15 MED ORDER — POTASSIUM CHLORIDE CRYS ER 20 MEQ PO TBCR: 20.0000 meq | EXTENDED_RELEASE_TABLET | Freq: Three times a day (TID) | ORAL | 0 refills | Status: DC

## 2023-09-15 NOTE — Progress Notes (Signed)
Date:  09/15/2023   Name:  [name]   DOB:  December 30, 1955   MRN:  [id number]   Chief Complaint: Hypertension  Hypertension This is a chronic problem. The current episode started more than 1 year ago. The problem has been gradually improving since onset. The problem is controlled. Pertinent negatives include no blurred vision, chest pain, headaches, neck pain, orthopnea, palpitations, peripheral edema, PND or shortness of breath. There are no associated agents to hypertension. There are no known risk factors for coronary artery disease. Past treatments include diuretics, calcium channel blockers and beta blockers. The current treatment provides moderate improvement. There are no compliance problems.  There is no history of CAD/MI or CVA. There is no history of chronic renal disease, a hypertension causing med or renovascular disease.  Sinusitis This is a new problem. The current episode started 1 to 4 weeks ago (2 weeks). The problem has been waxing and waning since onset. There has been no fever. The pain is mild. Associated symptoms include congestion. Pertinent negatives include no chills, coughing, diaphoresis, ear pain, headaches, hoarse voice, neck pain, shortness of breath, sinus pressure, sneezing, sore throat or swollen glands. Past treatments include nothing. The treatment provided mild relief.  Hyperlipidemia This is a chronic problem. The current episode started more than 1 year ago. The problem is controlled. Recent lipid tests were reviewed and are normal. He has no history of chronic renal disease, diabetes, hypothyroidism, liver disease, obesity or nephrotic syndrome. There are no known factors aggravating his hyperlipidemia. Pertinent negatives include no chest pain or shortness of breath. Current antihyperlipidemic treatment includes statins. The current treatment provides moderate improvement of lipids. There are no compliance problems.  Risk factors for coronary artery  disease include dyslipidemia and hypertension.    Lab Results  Component Value Date   NA 133 (L) 03/24/2023   K 4.0 03/24/2023   CO2 23 03/24/2023   GLUCOSE 110 (H) 03/24/2023   BUN 16 03/24/2023   CREATININE 1.07 03/24/2023   CALCIUM 8.9 03/24/2023   EGFR 85 02/21/2022   GFRNONAA >60 03/24/2023   Lab Results  Component Value Date   CHOL 134 03/01/2023   HDL 40 03/01/2023   LDLCALC 78 03/01/2023   TRIG 84 03/01/2023   CHOLHDL 4.1 08/07/2019   Lab Results  Component Value Date   TSH 0.569 02/21/2022   Lab Results  Component Value Date   HGBA1C 6.4 (H) 03/01/2023   Lab Results  Component Value Date   WBC 5.0 02/23/2023   HGB 15.2 02/23/2023   HCT 44.7 02/23/2023   MCV 83.6 02/23/2023   PLT 197 02/23/2023   Lab Results  Component Value Date   ALT 16 06/15/2022   AST 13 06/15/2022   ALKPHOS 96 06/15/2022   BILITOT 0.4 06/15/2022   No results found for: "25OHVITD2", "25OHVITD3", "VD25OH"   Review of Systems  Constitutional:  Negative for chills, diaphoresis and fever.  HENT:  Positive for congestion, postnasal drip and rhinorrhea. Negative for ear pain, hoarse voice, sinus pressure, sneezing and sore throat.   Eyes:  Negative for blurred vision.  Respiratory:  Negative for cough, chest tightness, shortness of breath and wheezing.   Cardiovascular:  Negative for chest pain, palpitations, orthopnea and PND.  Gastrointestinal:  Negative for abdominal distention, blood in stool and constipation.  Musculoskeletal:  Negative for neck pain.  Neurological:  Negative for headaches.    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/31/2022   Hypokalemia 08/24/2021   Bacteremia due to Gram-negative bacteria    AF (paroxysmal atrial fibrillation) (HCC)    SIRS (systemic inflammatory response syndrome) (HCC) 07/27/2021   Sepsis secondary to UTI (HCC) 07/27/2021   Ureteral stricture 06/18/2021   Sacroiliitis (HCC) 05/07/2021    Spinal stenosis of lumbosacral region 01/18/2021   Herniated nucleus pulposus, lumbar 03/16/2020   Body mass index (BMI) 33.0-33.9, adult 09/03/2019   Other intervertebral disc displacement, lumbar region 06/25/2019   HNP (herniated nucleus pulposus), lumbar 06/12/2019   UTI due to Klebsiella species 05/08/2019   Biceps tendonitis on right 07/27/2018   Spinal stenosis, 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   Lumbago with sciatica, unspecified side 11/14/2017   Primary osteoarthritis of right knee 10/12/2017   Taking multiple medications for chronic disease 07/30/2015   Reactive airway disease 07/01/2015   Allergic sinusitis 07/01/2015   Essential hypertension 07/01/2015   Valvular heart disease 07/01/2015   Metatarsalgia of left foot 06/10/2015   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    Allergies  Allergen Reactions   Meloxicam Palpitations   Sulfa Antibiotics Rash    Past Surgical History:  Procedure Laterality Date   ABDOMINAL EXPOSURE N/A 01/18/2021   Procedure: ABDOMINAL EXPOSURE;  Surgeon: Cephus Shelling, MD;  Location: [facility] OR;  Service: Vascular;  Laterality: N/A;   ANTERIOR LUMBAR FUSION N/A 01/18/2021   Procedure: Anterior Lumbar Interbody Fusion - Lumbar Five-Sarcal One;  Surgeon: Donalee Citrin, MD;  Location[facility] OR;  Service: Neurosurgery;  Laterality: N/A;  Anterior Lumbar Interbody Fusion - Lumbar Five-Sacral One   CYSTOSCOPY W/ URETERAL STENT PLACEMENT Left 06/18/2021   Procedure: CYSTOSCOPY WITH RETROGRADE PYELOGRAM/URETERAL STENT EXCHANGE;  Surgeon: Sebastian Ache, MD;  Location: [facility] ORS;  Service: Urology;  Laterality: Left;   CYSTOSCOPY WITH RETROGRADE PYELOGRAM, URETEROSCOPY AND STENT PLACEMENT Left 04/21/2021   Procedure: CYSTOSCOPY WITH RETROGRADE PYELOGRAM, DIAGNOSTICURETEROSCOPY AND STENT PLACEMENT;  Surgeon: Sebastian Ache, MD;  Location: [facility];  Service: Urology;  Laterality: Left;  1 HR   EXTRACORPOREAL SHOCK WAVE LITHOTRIPSY      approx 2002   LUMBAR DISC SURGERY  2018   LUMBAR LAMINECTOMY/DECOMPRESSION MICRODISCECTOMY Right 08/31/2022   Procedure: Redo foraminotomy right Lumbar four-five;  Surgeon: Donalee Citrin, MD;  Location: [facility] OR;  Service: Neurosurgery;  Laterality: Right;   REPAIR DURAL / CSF LEAK  2018   post op lumbar diskectomy   REVISION TOTAL KNEE ARTHROPLASTY Left 2015   TOTAL KNEE ARTHROPLASTY Left 2012   TRANSFORAMINAL LUMBAR INTERBODY FUSION (TLIF) WITH PEDICLE SCREW FIXATION 1 LEVEL N/A 06/12/2019   Procedure: LUMBAR FOUR-FIVE TRANSFORAMINAL LUMBAR INTERBODY FUSION (TLIF) WITH PEDICLE SCREW FIXATION;  Surgeon: Donalee Citrin, MD;  Location: [facility] OR;  Service: Neurosurgery;  Laterality: N/A;   UMBILICAL HERNIA REPAIR  1992    Social History   Tobacco Use   Smoking status: Never   Smokeless tobacco: Former    Types: Chew    Quit date: 03/2020  Vaping Use   Vaping status: Never Used  Substance Use Topics   Alcohol use: Yes    Comment: ocassional   Drug use: Never     Medication list has been reviewed and updated.  Current Meds  Medication Sig   acetaminophen (TYLENOL) 325 MG tablet Take 650 mg by mouth every 6 (six) hours as needed for moderate pain.   albuterol (VENTOLIN HFA) 108 (90 Base) MCG/ACT inhaler Inhale 2 puffs into the lungs every 6 (six) hours as needed for wheezing or shortness of  breath.   apixaban (ELIQUIS) 5 MG TABS tablet Take 1 tablet (5 mg total) by mouth 2 (two) times daily. Restart Wednesday, November 16   celecoxib (CELEBREX) 200 MG capsule Take 200 mg by mouth daily.   diltiazem (CARDIZEM CD) 180 MG 24 hr capsule Take 1 capsule (180 mg total) by mouth 2 (two) times daily.   ferrous sulfate 325 (65 FE) MG tablet Take 325 mg by mouth daily with breakfast.   fexofenadine (ALLEGRA) 180 MG tablet Take 1 tablet (180 mg total) by mouth daily.   fluticasone (FLONASE) 50 MCG/ACT nasal spray USE 2 SPRAY(S) IN EACH NOSTRIL ONCE DAILY AS NEEDED FOR ALLERGIES    Fluticasone-Umeclidin-Vilant (TRELEGY ELLIPTA) 100-62.5-25 MCG/ACT AEPB Inhale into the lungs.   guaiFENesin-codeine (ROBITUSSIN AC) 100-10 MG/5ML syrup Take 5 mLs by mouth 3 (three) times daily as needed for cough.   hydrochlorothiazide (HYDRODIURIL) 12.5 MG tablet Take 1 tablet (12.5 mg total) by mouth daily.   methocarbamol (ROBAXIN) 500 MG tablet Take by mouth.   metoprolol succinate (TOPROL-XL) 50 MG 24 hr tablet Take 1 tablet by mouth daily. cardio   montelukast (SINGULAIR) 10 MG tablet TAKE 1 TABLET BY MOUTH AT BEDTIME   Multiple Vitamins-Minerals (MULTIVITAMIN MEN) TABS Take 1 tablet by mouth daily.   pantoprazole (PROTONIX) 40 MG tablet Take 1 tablet (40 mg total) by mouth every evening.   potassium chloride SA (KLOR-CON M) 20 MEQ tablet TAKE 1 TO 2  BY MOUTH TWICE DAILY IN THE MORNING AND IN THE EVENING   rosuvastatin (CRESTOR) 5 MG tablet TAKE 1 TABLET BY MOUTH THREE TIMES A WEEK   spironolactone (ALDACTONE) 50 MG tablet Take 50 mg by mouth daily.   tadalafil (CIALIS) 5 MG tablet Take 1-2 tablets (5-10 mg total) by mouth daily as needed for erectile dysfunction.   tamsulosin (FLOMAX) 0.4 MG CAPS capsule Take 1 capsule (0.4 mg total) by mouth daily.   valsartan (DIOVAN) 320 MG tablet Take 1 tablet (320 mg total) by mouth daily.       09/15/2023    9:00 AM 09/01/2023   10:42 AM 03/06/2023    3:09 PM 02/23/2023    4:05 PM  GAD 7 : Generalized Anxiety Score  Nervous, Anxious, on Edge 0 0 0 0  Control/stop worrying 0 0 0 0  Worry too much - different things 0 0 0 0  Trouble relaxing 0 0 0 0  Restless 0 0 0 0  Easily annoyed or irritable 0 0 0 0  Afraid - awful might happen 0 0 0 0  Total GAD 7 Score 0 0 0 0  Anxiety Difficulty Not difficult at all Not difficult at all Not difficult at all Not difficult at all       09/15/2023    8:59 AM 09/01/2023   10:41 AM 03/06/2023    3:09 PM  Depression screen PHQ 2/9  Decreased Interest 0 0 0  Down, Depressed, Hopeless 0 0 0  PHQ - 2  Score 0 0 0  Altered sleeping 0 0 0  Tired, decreased energy 0 0 0  Change in appetite 0 0 0  Feeling bad or failure about yourself  0 0 0  Trouble concentrating 0 0 0  Moving slowly or fidgety/restless 0 0 0  Suicidal thoughts 0 0 0  PHQ-9 Score 0 0 0  Difficult doing work/chores Not difficult at all Not difficult at all Not difficult at all    BP Readings from Last 3 Encounters:  09/15/23  128/74  09/01/23 112/70  03/24/23 126/72    Physical Exam Vitals and nursing note reviewed.  HENT:     Head: Normocephalic.     Right Ear: Tympanic membrane, ear canal and external ear normal.     Left Ear: Tympanic membrane, ear canal and external ear normal.     Nose: Nose normal. No congestion or rhinorrhea.     Mouth/Throat:     Pharynx: No oropharyngeal exudate or posterior oropharyngeal erythema.  Eyes:     General: No scleral icterus.       Right eye: No discharge.        Left eye: No discharge.     Conjunctiva/sclera: Conjunctivae normal.     Pupils: Pupils are equal, round, and reactive to light.  Neck:     Thyroid: No thyromegaly.     Vascular: No JVD.     Trachea: No tracheal deviation.  Cardiovascular:     Rate and Rhythm: Normal rate and regular rhythm.     Heart sounds: Normal heart sounds. No murmur heard.    No friction rub. No gallop.  Pulmonary:     Effort: No respiratory distress.     Breath sounds: Normal breath sounds. No wheezing, rhonchi or rales.  Abdominal:     General: Bowel sounds are normal.     Palpations: Abdomen is soft. There is no mass.     Tenderness: There is no abdominal tenderness. There is no guarding or rebound.  Musculoskeletal:        General: No tenderness. Normal range of motion.     Cervical back: Normal range of motion and neck supple. No tenderness.  Lymphadenopathy:     Cervical: No cervical adenopathy.  Skin:    General: Skin is warm.     Findings: No rash.  Neurological:     Mental Status: He is alert.     Deep Tendon  Reflexes:     Reflex Scores:      Patellar reflexes are 2+ on the right side and 2+ on the left side.    Wt Readings from Last 3 Encounters:  09/15/23 251 lb (113.9 kg)  09/01/23 249 lb (112.9 kg)  03/24/23 244 lb (110.7 kg)    BP 128/74   Pulse (!) 57   Ht 6\' 1"  (1.854 m)   Wt 251 lb (113.9 kg)   SpO2 98%   BMI 33.12 kg/m   Assessment and Plan:  1. Essential hypertension Chronic.  Controlled.  Stable.  Blood pressure 128/74.  Asymptomatic.  Tolerating medications well.  Will continue diltiazem 180 mg twice a day, hydrochlorothiazide 12.5 mg once a day and valsartan 320 mg once a day.  Will check potassium for previous history of hypokalemia. - diltiazem (CARDIZEM CD) 180 MG 24 hr capsule; Take 1 capsule (180 mg total) by mouth 2 (two) times daily.  Dispense: 180 capsule; Refill: 1 - hydrochlorothiazide (HYDRODIURIL) 12.5 MG tablet; Take 1 tablet (12.5 mg total) by mouth daily.  Dispense: 90 tablet; Refill: 1 - valsartan (DIOVAN) 320 MG tablet; Take 1 tablet (320 mg total) by mouth daily.  Dispense: 90 tablet; Refill: 1 - Potassium  2. Other seasonal allergic rhinitis Chronic.  Controlled.  Stable.  Continue Allegra 180 mg once a day. - fexofenadine (ALLEGRA) 180 MG tablet; Take 1 tablet (180 mg total) by mouth daily.  Dispense: 90 tablet; Refill: 1  3. Generalized edema Chronic.  Controlled.  Stable.  Patient has had chronic edema which is controlled with daily  dosings of hydrochlorothiazide 12.5 mg daily. - hydrochlorothiazide (HYDRODIURIL) 12.5 MG tablet; Take 1 tablet (12.5 mg total) by mouth daily.  Dispense: 90 tablet; Refill: 1  4. Valvular heart disease Valvular disease as noted above - hydrochlorothiazide (HYDRODIURIL) 12.5 MG tablet; Take 1 tablet (12.5 mg total) by mouth daily.  Dispense: 90 tablet; Refill: 1  5. Seasonal allergic rhinitis, unspecified trigger Chronic.  Controlled.  Stable.  Allergic rhinitis controlled with montelukast 10 mg once a day and  Claritin every morning.  Combination patient is found to be significantly helpful in limiting symptomatology allergic rhinitis. - montelukast (SINGULAIR) 10 MG tablet; Take 1 tablet (10 mg total) by mouth at bedtime.  Dispense: 90 tablet; Refill: 1  6. Gastroesophageal reflux disease, unspecified whether esophagitis present Chronic.  Controlled.  Stable.  Continue pantoprazole 40 mg once a day. - pantoprazole (PROTONIX) 40 MG tablet; Take 1 tablet (40 mg total) by mouth every evening.  Dispense: 90 tablet; Refill: 1  7. Hypokalemia Chronic.  Controlled.  Stable.  Patient currently is taking potassium 20 mill equivalents 3 times a day.  Will check potassium to see if this is appropriately being addressed and make adjustments accordingly. - potassium chloride SA (KLOR-CON M) 20 MEQ tablet; Take 1 tablet (20 mEq total) by mouth 3 (three) times daily.  Dispense: 270 tablet; Refill: 0 - Potassium  8. Mixed hyperlipidemia Chronic.  Controlled.  Stable.  Continue Crestor 5 mg once 3 times a week.  Will check lipid panel for current level of control.  I think patient is currently taking this on a daily basis. - rosuvastatin (CRESTOR) 5 MG tablet; TAKE 1 TABLET BY MOUTH THREE TIMES A WEEK  Dispense: 90 tablet; Refill: 1 - Lipid Panel With LDL/HDL Ratio    Elizabeth Sauer, MD

## 2023-09-16 LAB — LIPID PANEL WITH LDL/HDL RATIO
Cholesterol, Total: 214 mg/dL — ABNORMAL HIGH (ref 100–199)
HDL: 43 mg/dL (ref >39)
LDL Chol Calc (NIH): 146 mg/dL — ABNORMAL HIGH (ref 0–99)
LDL/HDL Ratio: 3.4 ratio (ref 0.0–3.6)
Triglycerides: 140 mg/dL (ref 0–149)
VLDL Cholesterol Cal: 25 mg/dL (ref 5–40)

## 2023-09-16 LAB — POTASSIUM: Potassium: 4.5 mmol/L (ref 3.5–5.2)

## 2023-09-17 ENCOUNTER — Encounter: Payer: Self-pay | Admitting: Family Medicine

## 2023-09-18 ENCOUNTER — Other Ambulatory Visit: Payer: Self-pay | Admitting: Family Medicine

## 2023-09-28 ENCOUNTER — Encounter: Payer: Self-pay | Admitting: Emergency Medicine

## 2023-10-04 ENCOUNTER — Other Ambulatory Visit
Admission: RE | Admit: 2023-10-04 | Discharge: 2023-10-04 | Disposition: A | Discharge status: Home / Self Care | Payer: Medicare PPO | Attending: Urology | Admitting: Urology

## 2023-10-04 ENCOUNTER — Other Ambulatory Visit: Payer: Self-pay

## 2023-10-04 DIAGNOSIS — Z125 Encounter for screening for malignant neoplasm of prostate: Secondary | ICD-10-CM | POA: Insufficient documentation

## 2023-10-04 LAB — PSA: Prostatic Specific Antigen: 2.05 ng/mL (ref 0.00–4.00)

## 2023-10-05 ENCOUNTER — Ambulatory Visit: Payer: Medicare PPO | Admitting: Emergency Medicine

## 2023-10-05 VITALS — Ht 73.0 in | Wt 250.0 lb

## 2023-10-05 DIAGNOSIS — Z0000 Encounter for general adult medical examination without abnormal findings: Secondary | ICD-10-CM | POA: Diagnosis not present

## 2023-10-05 NOTE — Patient Instructions (Addendum)
[name] , Thank you for taking time to come for your Medicare Wellness Visit. I appreciate your ongoing commitment to your health goals. Please review the following plan we discussed and let me know if I can assist you in the future.   Referrals/Orders/Follow-Ups/Clinician Recommendations: Get a tetanus shot and shingles vaccines at your earliest convenience. Get a routine eye exam as your convenience.  This is a list of the screening recommended for you and due dates:  Health Maintenance  Topic Date Due   Zoster (Shingles) Vaccine (1 of 2) Never done   COVID-19 Vaccine (4 - 2024-25 season) 06/25/2023   Medicare Annual Wellness Visit  10/04/2024   Colon Cancer Screening  06/05/2026   Pneumonia Vaccine  Completed   Flu Shot  Completed   Hepatitis C Screening  Completed   HPV Vaccine  Aged Out   DTaP/Tdap/Td vaccine  Discontinued    Advanced directives: (In Chart) A copy of your advanced directives are scanned into your chart should your provider ever need it.  Next Medicare Annual Wellness Visit scheduled for next year: Yes, 10/31/24 @ 3:50pm

## 2023-10-05 NOTE — Progress Notes (Signed)
Subjective:   [name] is a 67 y.o. male who presents for Medicare Annual/Subsequent preventive examination.  Visit Complete: Virtual I connected with  Ophelia Charter on 10/05/23 by a audio enabled telemedicine application and verified that I am speaking with the correct person using two identifiers.  Patient Location: Home  Provider Location: Home Office  I discussed the limitations of evaluation and management by telemedicine. The patient expressed understanding and agreed to proceed.  Vital Signs: Because this visit was a virtual/telehealth visit, some criteria may be missing or patient reported. Any vitals not documented were not able to be obtained and vitals that have been documented are patient reported.   Cardiac Risk Factors include: advanced age (>37men, >39 women);male gender;dyslipidemia;hypertension;obesity (BMI >30kg/m2)     Objective:    Today's Vitals   10/05/23 1543  Weight: 250 lb (113.4 kg)  Height: 6\' 1"  (1.854 m)   Body mass index is 32.98 kg/m.     10/05/2023    3:57 PM 02/19/2023   11:18 AM 09/21/2022    9:00 AM 08/22/2022    2:58 PM 02/20/2022   11:15 AM 10/03/2021    8:55 AM 09/20/2021    8:52 AM  Advanced Directives  Does Patient Have a Medical Advance Directive? Yes No Yes Yes Yes Yes Yes  Type of Estate agent of [place];Living will  Healthcare Power of [employer] of [place];Living will Healthcare Power of [place];Living will Healthcare Power of [employer] of [place];Living will  Does patient want to make changes to medical advance directive? No - Patient declined   No - Patient declined     Copy of Healthcare Power of Attorney in Chart? No - copy requested  No - copy requested Yes - validated most recent copy scanned in chart (See row information)   Yes - validated most recent copy scanned in chart (See row information)    Current Medications (verified) Outpatient Encounter  Medications as of 10/05/2023  Medication Sig   acetaminophen (TYLENOL) 325 MG tablet Take 650 mg by mouth every 6 (six) hours as needed for moderate pain.   albuterol (VENTOLIN HFA) 108 (90 Base) MCG/ACT inhaler Inhale 2 puffs into the lungs every 6 (six) hours as needed for wheezing or shortness of breath.   apixaban (ELIQUIS) 5 MG TABS tablet Take 1 tablet (5 mg total) by mouth 2 (two) times daily. Restart Wednesday, November 16   celecoxib (CELEBREX) 200 MG capsule Take 200 mg by mouth daily.   diltiazem (CARDIZEM CD) 180 MG 24 hr capsule Take 1 capsule (180 mg total) by mouth 2 (two) times daily.   ferrous sulfate 325 (65 FE) MG tablet Take 325 mg by mouth daily with breakfast.   fexofenadine (ALLEGRA) 180 MG tablet Take 1 tablet (180 mg total) by mouth daily.   fluticasone (FLONASE) 50 MCG/ACT nasal spray Place 2 sprays into both nostrils daily.   Fluticasone-Umeclidin-Vilant (TRELEGY ELLIPTA) 100-62.5-25 MCG/ACT AEPB Inhale into the lungs.   hydrochlorothiazide (HYDRODIURIL) 12.5 MG tablet Take 1 tablet (12.5 mg total) by mouth daily.   methocarbamol (ROBAXIN) 500 MG tablet Take by mouth.   metoprolol succinate (TOPROL-XL) 50 MG 24 hr tablet Take 1 tablet (50 mg total) by mouth daily. cardio   montelukast (SINGULAIR) 10 MG tablet Take 1 tablet (10 mg total) by mouth at bedtime.   Multiple Vitamins-Minerals (MULTIVITAMIN MEN) TABS Take 1 tablet by mouth daily.   pantoprazole (PROTONIX) 40 MG tablet Take 1 tablet (40 mg  total) by mouth every evening.   potassium chloride SA (KLOR-CON M) 20 MEQ tablet Take 1 tablet (20 mEq total) by mouth 3 (three) times daily.   rosuvastatin (CRESTOR) 5 MG tablet TAKE 1 TABLET BY MOUTH THREE TIMES A WEEK   spironolactone (ALDACTONE) 50 MG tablet Take 50 mg by mouth daily.   tadalafil (CIALIS) 5 MG tablet Take 1-2 tablets (5-10 mg total) by mouth daily as needed for erectile dysfunction.   tamsulosin (FLOMAX) 0.4 MG CAPS capsule Take 1 capsule (0.4 mg total)  by mouth daily.   valsartan (DIOVAN) 320 MG tablet Take 1 tablet (320 mg total) by mouth daily.   cyclobenzaprine (FLEXERIL) 5 MG tablet Take 5 mg by mouth 3 (three) times daily as needed. (Patient not taking: Reported on 10/05/2023)   doxycycline (VIBRA-TABS) 100 MG tablet Take 1 tablet (100 mg total) by mouth 2 (two) times daily. (Patient not taking: Reported on 10/05/2023)   guaiFENesin-codeine (ROBITUSSIN AC) 100-10 MG/5ML syrup Take 5 mLs by mouth 3 (three) times daily as needed for cough. (Patient not taking: Reported on 10/05/2023)   No facility-administered encounter medications on file as of 10/05/2023.    Allergies (verified) Meloxicam and Sulfa antibiotics   History: Past Medical History:  Diagnosis Date   Acquired deafness, right    per pt completed deafness right ear since age 3   Allergic rhinitis, seasonal    Anticoagulant long-term use    eliquis--- managed by cardiology   BPH associated with nocturia    urologist--- dr Berneice Heinrich   Cancer [facility])    basal and squamous cell   Chronic low back pain    DDD (degenerative disc disease), lumbosacral    Foraminal stenosis of lumbosacral region    GERD (gastroesophageal reflux disease)    Heart murmur    History of kidney stones    History of skin cancer    Hydronephrosis, left    Hyperlipidemia, mixed    Hypertension, essential    followed by pcp and cardiology   Hypokalemia    OA (osteoarthritis)    Paroxysmal atrial fibrillation (HCC) 01/28/2017   cardiologist--- dr Gwen Pounds;   pt had ETT 03-16-2017 per cardio note normal, event monitor 02-09-2017 showed baseline SR some peroids  Afib with controlled rate with occ PAC/ PVC;  per pt had echo many yrs ago prior to AFib   Past Surgical History:  Procedure Laterality Date   ABDOMINAL EXPOSURE N/A 01/18/2021   Procedure: ABDOMINAL EXPOSURE;  Surgeon: Cephus Shelling, MD;  Location: [facility] OR;  Service: Vascular;  Laterality: N/A;   ANTERIOR LUMBAR FUSION N/A 01/18/2021    Procedure: Anterior Lumbar Interbody Fusion - Lumbar Five-Sarcal One;  Surgeon: Donalee Citrin, MD;  Location: [facility] OR;  Service: Neurosurgery;  Laterality: N/A;  Anterior Lumbar Interbody Fusion - Lumbar Five-Sacral One   CYSTOSCOPY W/ URETERAL STENT PLACEMENT Left 06/18/2021   Procedure: CYSTOSCOPY WITH RETROGRADE PYELOGRAM/URETERAL STENT EXCHANGE;  Surgeon: Sebastian Ache, MD;  Location: [facility] ORS;  Service: Urology;  Laterality: Left;   CYSTOSCOPY WITH RETROGRADE PYELOGRAM, URETEROSCOPY AND STENT PLACEMENT Left 04/21/2021   Procedure: CYSTOSCOPY WITH RETROGRADE PYELOGRAM, DIAGNOSTICURETEROSCOPY AND STENT PLACEMENT;  Surgeon: Sebastian Ache, MD;  Location: [facility];  Service: Urology;  Laterality: Left;  1 HR   EXTRACORPOREAL SHOCK WAVE LITHOTRIPSY     approx 2002   LUMBAR DISC SURGERY  2018   LUMBAR LAMINECTOMY/DECOMPRESSION MICRODISCECTOMY Right 08/31/2022   Procedure: Redo foraminotomy right Lumbar four-five;  Surgeon: Donalee Citrin, MD;  Location:  [facility] OR;  Service: Neurosurgery;  Laterality: Right;   REPAIR DURAL / CSF LEAK  2018   post op lumbar diskectomy   REVISION TOTAL KNEE ARTHROPLASTY Left 2015   TOTAL KNEE ARTHROPLASTY Left 2012   TRANSFORAMINAL LUMBAR INTERBODY FUSION (TLIF) WITH PEDICLE SCREW FIXATION 1 LEVEL N/A 06/12/2019   Procedure: LUMBAR FOUR-FIVE TRANSFORAMINAL LUMBAR INTERBODY FUSION (TLIF) WITH PEDICLE SCREW FIXATION;  Surgeon: Donalee Citrin, MD;  Location: [facility] OR;  Service: Neurosurgery;  Laterality: N/A;   UMBILICAL HERNIA REPAIR  1992   Family History  Problem Relation Age of Onset   Cancer Mother    Cancer Father    Cancer Brother    Hyperlipidemia Brother    Social History   Socioeconomic History   Marital status: Married    Spouse name: Kylie Lotti   Number of children: 4   Years of education: Not on file   Highest education level: Not on file  Occupational History   Not on file  Tobacco Use   Smoking status: Never   Smokeless tobacco: Former     Types: Chew    Quit date: 03/2020  Vaping Use   Vaping status: Never Used  Substance and Sexual Activity   Alcohol use: Yes    Comment: 1 bourbon monthly or less   Drug use: Never   Sexual activity: Yes  Other Topics Concern   Not on file  Social History Narrative   Still working full time, plans to retire 12/2023   4 daughters and 10 grandchildren   Social Drivers of Corporate investment banker Strain: Low Risk  (10/05/2023)   Overall Financial Resource Strain (CARDIA)    Difficulty of Paying Living Expenses: Not hard at all  Food Insecurity: No Food Insecurity (10/05/2023)   Hunger Vital Sign    Worried About Running Out of Food in the Last Year: Never true    Ran Out of Food in the Last Year: Never true  Transportation Needs: No Transportation Needs (10/05/2023)   PRAPARE - Administrator, Civil Service (Medical): No    Lack of Transportation (Non-Medical): No  Physical Activity: Insufficiently Active (10/05/2023)   Exercise Vital Sign    Days of Exercise per Week: 7 days    Minutes of Exercise per Session: 20 min  Stress: No Stress Concern Present (10/05/2023)   [employer] of Occupational Health - Occupational Stress Questionnaire    Feeling of Stress : Only a little  Social Connections: Socially Integrated (10/05/2023)   Social Connection and Isolation Panel [NHANES]    Frequency of Communication with Friends and Family: More than three times a week    Frequency of Social Gatherings with Friends and Family: More than three times a week    Attends Religious Services: More than 4 times per year    Active Member of [employer] or Organizations: Yes    Attends Engineer, structural: More than 4 times per year    Marital Status: Married    Tobacco Counseling Counseling given: Not Answered   Clinical Intake:  Pre-visit preparation completed: Yes  Pain : No/denies pain     BMI - recorded: 32.98 Nutritional Status: BMI > 30   Obese Nutritional Risks: None Diabetes: No  How often do you need to have someone help you when you read instructions, pamphlets, or other written materials from your doctor or pharmacy?: 1 - Never  Interpreter Needed?: No  Information entered by :: Tora Kindred, CMA   Activities of  Daily Living    10/05/2023    3:45 PM 01/20/2023    3:02 PM  In your present state of health, do you have any difficulty performing the following activities:  Hearing? 1 0  Comment wears hearing aids   Vision? 0 0  Difficulty concentrating or making decisions? 0 0  Walking or climbing stairs? 0 0  Dressing or bathing? 0 0  Doing errands, shopping? 0 0  Preparing Food and eating ? N   Using the Toilet? N   In the past six months, have you accidently leaked urine? N   Do you have problems with loss of bowel control? N   Managing your Medications? N   Managing your Finances? N   Housekeeping or managing your Housekeeping? N     Patient Care Team: Duanne Limerick, MD as PCP - General (Family Medicine)  Indicate any recent Medical Services you may have received from other than [facility] providers in the past year (date may be approximate).     Assessment:   This is a routine wellness examination for Jjesus.  Hearing/Vision screen Hearing Screening - Comments:: Wears a hearing aid Vision Screening - Comments:: Needs eye exam   Goals Addressed               This Visit's Progress     Weight (lb) < 230 lb (104.3 kg) (pt-stated)   250 lb (113.4 kg)     Depression Screen    10/05/2023    3:55 PM 09/15/2023    8:59 AM 09/01/2023   10:41 AM 03/06/2023    3:09 PM 02/23/2023    4:05 PM 01/20/2023    3:02 PM 01/13/2023    1:16 PM  PHQ 2/9 Scores  PHQ - 2 Score 0 0 0 0 0 0 0  PHQ- 9 Score 0 0 0 0 0 0 0    Fall Risk    10/05/2023    3:59 PM 09/15/2023    8:59 AM 09/01/2023   10:41 AM 03/06/2023    3:09 PM 03/01/2023   10:31 AM  Fall Risk   Falls in the past year? 0 0 0 0 0  Number falls in  past yr: 0 0 0 0 0  Injury with Fall? 0 0 0 0 0  Risk for fall due to : No Fall Risks No Fall Risks No Fall Risks No Fall Risks No Fall Risks  Follow up Falls prevention discussed Falls evaluation completed Falls evaluation completed Falls evaluation completed Falls evaluation completed    MEDICARE RISK AT HOME: Medicare Risk at Home Any stairs in or around the home?: Yes If so, are there any without handrails?: No Home free of loose throw rugs in walkways, pet beds, electrical cords, etc?: Yes Adequate lighting in your home to reduce risk of falls?: Yes Life alert?: No Use of a cane, walker or w/c?: No Grab bars in the bathroom?: Yes Shower chair or bench in shower?: Yes Elevated toilet seat or a handicapped toilet?: Yes  TIMED UP AND GO:  Was the test performed?  No    Cognitive Function:        10/05/2023    4:00 PM 09/21/2022    9:02 AM  6CIT Screen  What Year? 0 points 0 points  What month? 0 points 0 points  What time? 0 points 0 points  Count back from 20 0 points 0 points  Months in reverse 0 points 0 points  Repeat phrase 0 points 4  points  Total Score 0 points 4 points    Immunizations Immunization History  Administered Date(s) Administered   Fluad Quad(high Dose 65+) 08/24/2021   Influenza, High Dose Seasonal PF 07/23/2019   Influenza,inj,Quad PF,6+ Mos 07/30/2015, 07/26/2016, 09/18/2018, 06/21/2022   Influenza-Unspecified 07/24/2020, 08/15/2023   Moderna Sars-Covid-2 Vaccination 10/22/2019, 11/22/2019, 09/09/2020   PNEUMOCOCCAL CONJUGATE-20 08/24/2022   Pneumococcal Conjugate-13 08/07/2019   Pneumococcal Polysaccharide-23 07/30/2015, 12/16/2020   Tdap 06/21/2012    TDAP status: Due, Education has been provided regarding the importance of this vaccine. Advised may receive this vaccine at local pharmacy or Health Dept. Aware to provide a copy of the vaccination record if obtained from local pharmacy or Health Dept. Verbalized acceptance and  understanding.  Flu Vaccine status: Up to date  Pneumococcal vaccine status: Up to date  Covid-19 vaccine status: Information provided on how to obtain vaccines.   Qualifies for Shingles Vaccine? Yes   Zostavax completed No   Shingrix Completed?: No.    Education has been provided regarding the importance of this vaccine. Patient has been advised to call insurance company to determine out of pocket expense if they have not yet received this vaccine. Advised may also receive vaccine at local pharmacy or Health Dept. Verbalized acceptance and understanding.  Screening Tests Health Maintenance  Topic Date Due   Zoster Vaccines- Shingrix (1 of 2) Never done   COVID-19 Vaccine (4 - 2024-25 season) 06/25/2023   Medicare Annual Wellness (AWV)  10/04/2024   Colonoscopy  06/05/2026   Pneumonia Vaccine 22+ Years old  Completed   INFLUENZA VACCINE  Completed   Hepatitis C Screening  Completed   HPV VACCINES  Aged Out   DTaP/Tdap/Td  Discontinued    Health Maintenance  Health Maintenance Due  Topic Date Due   Zoster Vaccines- Shingrix (1 of 2) Never done   COVID-19 Vaccine (4 - 2024-25 season) 06/25/2023    Colorectal cancer screening: Type of screening: Colonoscopy. Completed 06/06/23. Repeat every 3 years  Lung Cancer Screening: (Low Dose CT Chest recommended if Age 14-80 years, 20 pack-year currently smoking OR have quit w/in 15years.) does not qualify.   Lung Cancer Screening Referral: n/a  Additional Screening:  Hepatitis C Screening: does not qualify; Completed 12/14/16  Vision Screening: Recommended annual ophthalmology exams for early detection of glaucoma and other disorders of the eye.  Dental Screening: Recommended annual dental exams for proper oral hygiene   Community Resource Referral / Chronic Care Management: CRR required this visit?  No   CCM required this visit?  No     Plan:     I have personally reviewed and noted the following in the patient's chart:    Medical and social history Use of alcohol, tobacco or illicit drugs  Current medications and supplements including opioid prescriptions. Patient is not currently taking opioid prescriptions. Functional ability and status Nutritional status Physical activity Advanced directives List of other physicians Hospitalizations, surgeries, and ER visits in previous 12 months Vitals Screenings to include cognitive, depression, and falls Referrals and appointments  In addition, I have reviewed and discussed with patient certain preventive protocols, quality metrics, and best practice recommendations. A written personalized care plan for preventive services as well as general preventive health recommendations were provided to patient.     Tora Kindred, CMA   10/05/2023   After Visit Summary: (MyChart) Due to this being a telephonic visit, the after visit summary with patients personalized plan was offered to patient via MyChart   Nurse Notes:  Needs Tdap and shingles vaccines. Needs routine eye exam. Patient to call and make his own appointment. Patient states he received the covid booster on 08/15/23

## 2023-10-10 ENCOUNTER — Ambulatory Visit: Payer: Medicare PPO | Admitting: Urology

## 2023-10-11 DIAGNOSIS — L300 Nummular dermatitis: Secondary | ICD-10-CM | POA: Diagnosis not present

## 2023-10-11 DIAGNOSIS — Z872 Personal history of diseases of the skin and subcutaneous tissue: Secondary | ICD-10-CM | POA: Diagnosis not present

## 2023-10-11 DIAGNOSIS — L578 Other skin changes due to chronic exposure to nonionizing radiation: Secondary | ICD-10-CM | POA: Diagnosis not present

## 2023-10-11 DIAGNOSIS — C44519 Basal cell carcinoma of skin of other part of trunk: Secondary | ICD-10-CM | POA: Diagnosis not present

## 2023-10-11 DIAGNOSIS — L570 Actinic keratosis: Secondary | ICD-10-CM | POA: Diagnosis not present

## 2023-10-11 DIAGNOSIS — Z859 Personal history of malignant neoplasm, unspecified: Secondary | ICD-10-CM | POA: Diagnosis not present

## 2023-10-11 DIAGNOSIS — L853 Xerosis cutis: Secondary | ICD-10-CM | POA: Diagnosis not present

## 2023-10-11 DIAGNOSIS — Z85828 Personal history of other malignant neoplasm of skin: Secondary | ICD-10-CM | POA: Diagnosis not present

## 2023-10-11 DIAGNOSIS — D485 Neoplasm of uncertain behavior of skin: Secondary | ICD-10-CM | POA: Diagnosis not present

## 2023-10-20 ENCOUNTER — Other Ambulatory Visit: Payer: Self-pay | Admitting: Family Medicine

## 2023-10-20 DIAGNOSIS — E876 Hypokalemia: Secondary | ICD-10-CM

## 2023-11-01 ENCOUNTER — Ambulatory Visit: Payer: Medicare PPO | Admitting: Urology

## 2023-11-01 VITALS — BP 127/78 | HR 68 | Ht 73.0 in | Wt 245.0 lb

## 2023-11-01 DIAGNOSIS — R351 Nocturia: Secondary | ICD-10-CM | POA: Diagnosis not present

## 2023-11-01 DIAGNOSIS — N401 Enlarged prostate with lower urinary tract symptoms: Secondary | ICD-10-CM | POA: Diagnosis not present

## 2023-11-01 DIAGNOSIS — Z125 Encounter for screening for malignant neoplasm of prostate: Secondary | ICD-10-CM | POA: Diagnosis not present

## 2023-11-01 DIAGNOSIS — N529 Male erectile dysfunction, unspecified: Secondary | ICD-10-CM

## 2023-11-01 DIAGNOSIS — N138 Other obstructive and reflux uropathy: Secondary | ICD-10-CM

## 2023-11-01 LAB — BLADDER SCAN AMB NON-IMAGING

## 2023-11-01 MED ORDER — TADALAFIL 10 MG PO TABS: 10.0000 mg | ORAL_TABLET | ORAL | 3 refills | Status: DC

## 2023-11-01 MED ORDER — TAMSULOSIN HCL 0.4 MG PO CAPS: 0.4000 mg | ORAL_CAPSULE | Freq: Every day | ORAL | 3 refills | Status: DC

## 2023-11-01 NOTE — Addendum Note (Signed)
 Addended by: Frankey Shown on: 11/01/2023 09:42 AM   Modules accepted: Orders

## 2023-11-01 NOTE — Progress Notes (Signed)
   11/01/2023 9:36 AM   Evalene LITTIE Cain 29-Nov-1955 [id number]  Reason for visit: Follow up elevated PSA, BPH, ED, left ureteral stricture  HPI: 68 year old male who I have been following for an elevated PSA, BPH and ED.    He had an elevated PSA of 11 after history of urinary retention and UTI after back surgery, which remained elevated at 10.4 at follow-up.  Our PA ordered a prostate MRI which showed a 70 g prostate with a PI-RADS 3 lesion, and fusion guided biopsy in [place] showed only benign prostatic tissue with chronic inflammation and necrotizing granuloma in the region of interest.  PSA down trended <4 on follow-up.  PSA from December 2024 normal at 2.05, and stable from 3.28 August 2022.  Reassurance provided regarding normal PSA, we reviewed the guidelines that do not recommend routine screening in men over age 38.  He continues on Flomax  which is working well for his urinary symptoms and he really has no urinary complaints today.  PVR is normal at 57 mL.    In terms of ED, he has noticed some decreased results on the Cialis .  He is currently taking 10 mg every 3 days.  I recommended changing his dosing to 10 mg every other day, and adding a 10 mg boost dose as needed prior to sexual activity.  We reviewed he could take the 10 mg daily as well, and max dose would be 20 mg/day.  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 Alvaro at [facility] urology in [place] who ultimately performed a left robotic ureteral reimplant and psoas hitch on 06/18/2021.  Follow-up imaging has showed no residual hydronephrosis, and renal function is normal with recent creatinine of 1.1, EGFR greater than 60.  It sounds like he has a final follow-up visit with Dr. Alvaro in April 2025.    -Flomax  refilled -Cialis  dose increased to 10 mg every other day with 10 mg boost dose as needed -RTC 1 year PVR, PSA reflex to free prior.   Likely can discontinue PSA screening if normal next year per guideline recommendations.   Redell JAYSON Burnet, MD  [facility] [address] [place], [place] [address] [phone]

## 2023-11-15 ENCOUNTER — Other Ambulatory Visit: Payer: Self-pay | Admitting: Family Medicine

## 2023-11-15 DIAGNOSIS — R601 Generalized edema: Secondary | ICD-10-CM

## 2023-11-15 DIAGNOSIS — I10 Essential (primary) hypertension: Secondary | ICD-10-CM

## 2023-11-15 DIAGNOSIS — I38 Endocarditis, valve unspecified: Secondary | ICD-10-CM

## 2023-11-15 DIAGNOSIS — K219 Gastro-esophageal reflux disease without esophagitis: Secondary | ICD-10-CM

## 2023-11-15 NOTE — Telephone Encounter (Signed)
Medication Refill -  Most Recent Primary Care Visit:  Provider: Tora Kindred  Department: [facility]  Visit Type: MEDICARE AWV, SEQUENTIAL  Date: 10/05/2023  Medication: hydrochlorothiazide (HYDRODIURIL) 12.5 MG tablet   pantoprazole (PROTONIX) 40 MG tablet  Has the patient contacted their pharmacy? Yes   Is this the correct pharmacy for this prescription? Yes  This is the patient's preferred pharmacy:   [facility] [address] [place] - [address] [place] [address] Phone: [phone] Fax: [phone]   Has the prescription been filled recently? Yes  Is the patient out of the medication? Yes  Has the patient been seen for an appointment in the last year OR does the patient have an upcoming appointment? Yes  Can we respond through MyChart? No  Agent: Please be advised that Rx refills may take up to 3 business days. We ask that you follow-up with your pharmacy.

## 2023-11-15 NOTE — Telephone Encounter (Signed)
Requests are too soon for refill, last refill 09/15/23 for 90 and 1 refill.  Requested Prescriptions  Pending Prescriptions Disp Refills   hydrochlorothiazide (HYDRODIURIL) 12.5 MG tablet 90 tablet 1    Sig: Take 1 tablet (12.5 mg total) by mouth daily.     Cardiovascular: Diuretics - Thiazide Failed - 11/15/2023 11:10 AM      Failed - Cr in normal range and within 180 days    Creatinine  Date Value Ref Range Status  12/04/2013 1.31 (H) 0.60 - 1.30 mg/dL Final   Creatinine, Ser  Date Value Ref Range Status  03/24/2023 1.07 0.61 - 1.24 mg/dL Final         Failed - Na in normal range and within 180 days    Sodium  Date Value Ref Range Status  03/24/2023 133 (L) 135 - 145 mmol/L Final  02/21/2022 144 134 - 144 mmol/L Final         Passed - K in normal range and within 180 days    Potassium  Date Value Ref Range Status  09/15/2023 4.5 3.5 - 5.2 mmol/L Final         Passed - Last BP in normal range    BP Readings from Last 1 Encounters:  11/01/23 127/78         Passed - Valid encounter within last 6 months    Recent Outpatient Visits           2 months ago Essential hypertension   [place] [facility], [facility]   2 months ago Acute maxillary sinusitis, recurrence not specified   [place] [facility], [facility]   7 months ago Dehydration   [facility] at [facility], [facility]   8 months ago Chronic sinusitis with recurrent bronchitis   [place] [facility], [facility]   8 months ago Essential hypertension   [place] [facility], [facility]       Future Appointments             In 3 months Duanne Limerick, MD [facility] at [facility], [facility]   In 11 months Sondra Come, MD [facility]  [facility] [place]             pantoprazole (PROTONIX) 40 MG tablet 90 tablet 1    Sig: Take 1 tablet (40 mg total) by mouth every evening.     Gastroenterology: Proton Pump Inhibitors Passed - 11/15/2023 11:10 AM      Passed - Valid encounter within last 12 months    Recent Outpatient Visits           2 months ago Essential hypertension   [place] [facility], [facility]   2 months ago Acute maxillary sinusitis, recurrence not specified   [place] [facility], MD   7 months ago Dehydration   [facility] at [facility], MD   8 months ago Chronic sinusitis with recurrent bronchitis   [place] [facility], MD   8 months ago Essential hypertension  [facility] at [facility], MD       Future Appointments             In 3 months Duanne Limerick, MD [facility] & [facility] Medicine at [facility]   In 11 months Richardo Hanks Laurette Schimke, MD [facility] [place]

## 2023-11-20 DIAGNOSIS — Z5181 Encounter for therapeutic drug level monitoring: Secondary | ICD-10-CM | POA: Diagnosis not present

## 2023-11-20 DIAGNOSIS — I480 Paroxysmal atrial fibrillation: Secondary | ICD-10-CM | POA: Diagnosis not present

## 2023-11-20 DIAGNOSIS — I77819 Aortic ectasia, unspecified site: Secondary | ICD-10-CM | POA: Diagnosis not present

## 2023-11-20 DIAGNOSIS — I38 Endocarditis, valve unspecified: Secondary | ICD-10-CM | POA: Diagnosis not present

## 2023-11-20 DIAGNOSIS — E782 Mixed hyperlipidemia: Secondary | ICD-10-CM | POA: Diagnosis not present

## 2023-11-20 DIAGNOSIS — Z79899 Other long term (current) drug therapy: Secondary | ICD-10-CM | POA: Diagnosis not present

## 2023-11-20 DIAGNOSIS — I10 Essential (primary) hypertension: Secondary | ICD-10-CM | POA: Diagnosis not present

## 2023-11-27 DIAGNOSIS — I77819 Aortic ectasia, unspecified site: Secondary | ICD-10-CM | POA: Diagnosis not present

## 2023-11-27 DIAGNOSIS — I480 Paroxysmal atrial fibrillation: Secondary | ICD-10-CM | POA: Diagnosis not present

## 2023-11-27 DIAGNOSIS — I38 Endocarditis, valve unspecified: Secondary | ICD-10-CM | POA: Diagnosis not present

## 2023-11-29 DIAGNOSIS — C4491 Basal cell carcinoma of skin, unspecified: Secondary | ICD-10-CM | POA: Diagnosis not present

## 2023-11-29 DIAGNOSIS — C44519 Basal cell carcinoma of skin of other part of trunk: Secondary | ICD-10-CM | POA: Diagnosis not present

## 2024-01-29 ENCOUNTER — Other Ambulatory Visit: Payer: Self-pay | Admitting: Family Medicine

## 2024-01-29 DIAGNOSIS — E876 Hypokalemia: Secondary | ICD-10-CM

## 2024-02-04 ENCOUNTER — Other Ambulatory Visit: Payer: Self-pay | Admitting: Family Medicine

## 2024-02-04 DIAGNOSIS — E876 Hypokalemia: Secondary | ICD-10-CM

## 2024-02-12 ENCOUNTER — Ambulatory Visit: Admitting: Family Medicine

## 2024-02-12 ENCOUNTER — Encounter: Payer: Self-pay | Admitting: Family Medicine

## 2024-02-12 VITALS — BP 98/60 | HR 61 | Ht 73.0 in | Wt 247.0 lb

## 2024-02-12 DIAGNOSIS — N62 Hypertrophy of breast: Secondary | ICD-10-CM

## 2024-02-12 DIAGNOSIS — R5383 Other fatigue: Secondary | ICD-10-CM | POA: Diagnosis not present

## 2024-02-12 NOTE — Progress Notes (Signed)
 Date:  02/12/2024   Name:  [name]   DOB:  August 13, 1956   MRN:  [id number]   Chief Complaint: Breast Mass (Tenderness to bil breast for the last 5 weeks. Tender to touch with mild swelling.) and Fatigue (Fatigue for the lat 5 weeks. Little energy. )  Patient is a 68year old male who presents for a bilateral breast enlargement with breast buds with tenderness exam. The patient reports the following problems: Overall general fatigue as well.[facility] maintenance has been reviewed up-to-date.    Thyroid  Problem Presents for initial (For presentation of fatigue) visit. Symptoms include fatigue and weight loss. Patient reports no anxiety, cold intolerance, constipation, depressed mood, diaphoresis, diarrhea, dry skin, hair loss, heat intolerance, hoarse voice, leg swelling, nail problem, palpitations, tremors, visual change or weight gain. The symptoms have been worsening. Past treatments include nothing.    Lab Results  Component Value Date   NA 133 (L) 03/24/2023   K 4.5 09/15/2023   CO2 23 03/24/2023   GLUCOSE 110 (H) 03/24/2023   BUN 16 03/24/2023   CREATININE 1.07 03/24/2023   CALCIUM  8.9 03/24/2023   EGFR 85 02/21/2022   GFRNONAA >60 03/24/2023   Lab Results  Component Value Date   CHOL 214 (H) 09/15/2023   HDL 43 09/15/2023   LDLCALC 146 (H) 09/15/2023   TRIG 140 09/15/2023   CHOLHDL 4.1 08/07/2019   Lab Results  Component Value Date   TSH 0.569 02/21/2022   Lab Results  Component Value Date   HGBA1C 6.4 (H) 03/01/2023   Lab Results  Component Value Date   WBC 5.0 02/23/2023   HGB 15.2 02/23/2023   HCT 44.7 02/23/2023   MCV 83.6 02/23/2023   PLT 197 02/23/2023   Lab Results  Component Value Date   ALT 16 06/15/2022   AST 13 06/15/2022   ALKPHOS 96 06/15/2022   BILITOT 0.4 06/15/2022   No results found for: "25OHVITD2", "25OHVITD3", "VD25OH"   Review of Systems  Constitutional:  Positive for fatigue and weight loss. Negative for chills,  diaphoresis, fever and weight gain.  HENT:  Negative for drooling, ear discharge, ear pain, hoarse voice and sore throat.   Respiratory:  Negative for cough and shortness of breath.   Cardiovascular:  Negative for chest pain, palpitations and leg swelling.  Gastrointestinal:  Negative for abdominal pain, blood in stool, constipation and diarrhea.  Endocrine: Negative for cold intolerance, heat intolerance and polydipsia.  Genitourinary:  Negative for dysuria and urgency.  Musculoskeletal:  Negative for back pain, myalgias and neck pain.  Skin:  Negative for rash.  Allergic/Immunologic: Negative for environmental allergies.  Neurological:  Negative for dizziness, tremors and headaches.  Hematological:  Does not bruise/bleed easily.  Psychiatric/Behavioral:  Negative for suicidal ideas. The patient is not nervous/anxious.     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   Hypokalemia 08/24/2021   Bacteremia due to Gram-negative bacteria    AF (paroxysmal atrial fibrillation) (HCC)    SIRS (systemic inflammatory response syndrome) (HCC) 07/27/2021   Sepsis secondary to UTI (HCC) 07/27/2021   Ureteral stricture 06/18/2021   Sacroiliitis (HCC) 05/07/2021   Spinal stenosis of lumbosacral region 01/18/2021   Herniated nucleus pulposus, lumbar 03/16/2020   Body mass index (BMI) 33.0-33.9, adult 09/03/2019   Other intervertebral disc displacement, lumbar region 06/25/2019   HNP (herniated nucleus pulposus), lumbar 06/12/2019   UTI due to Klebsiella species 05/08/2019   Biceps tendonitis  on right 07/27/2018   Spinal stenosis, 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   Lumbago with sciatica, unspecified side 11/14/2017   Primary osteoarthritis of right knee 10/12/2017   Taking multiple medications for chronic disease 07/30/2015   Reactive airway disease 07/01/2015   Allergic sinusitis 07/01/2015   Essential hypertension  07/01/2015   Valvular heart disease 07/01/2015   Metatarsalgia of left foot 06/10/2015   Hyperlipidemia, mixed 04/30/2015   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    Allergies  Allergen Reactions   Meloxicam Palpitations   Sulfa  Antibiotics Rash    Past Surgical History:  Procedure Laterality Date   ABDOMINAL EXPOSURE N/A 01/18/2021   Procedure: ABDOMINAL EXPOSURE;  Surgeon: Young Hensen, MD;  Location: [facility] OR;  Service: Vascular;  Laterality: N/A;   ANTERIOR LUMBAR FUSION N/A 01/18/2021   Procedure: Anterior Lumbar Interbody Fusion - Lumbar Five-Sarcal One;  Surgeon: Gearl Keens, MD;  Location: [facility] OR;  Service: Neurosurgery;  Laterality: N/A;  Anterior Lumbar Interbody Fusion - Lumbar Five-Sacral One   CYSTOSCOPY W/ URETERAL STENT PLACEMENT Left 06/18/2021   Procedure: CYSTOSCOPY WITH RETROGRADE PYELOGRAM/URETERAL STENT EXCHANGE;  Surgeon: Osborn Blaze, MD;  Location: [facility] ORS;  Service: Urology;  Laterality: Left;   CYSTOSCOPY WITH RETROGRADE PYELOGRAM, URETEROSCOPY AND STENT PLACEMENT Left 04/21/2021   Procedure: CYSTOSCOPY WITH RETROGRADE PYELOGRAM, DIAGNOSTICURETEROSCOPY AND STENT PLACEMENT;  Surgeon: Osborn Blaze, MD;  Location: [facility];  Service: Urology;  Laterality: Left;  1 HR   EXTRACORPOREAL SHOCK WAVE LITHOTRIPSY     approx 2002   LUMBAR DISC SURGERY  2018   LUMBAR LAMINECTOMY/DECOMPRESSION MICRODISCECTOMY Right 08/31/2022   Procedure: Redo foraminotomy right Lumbar four-five;  Surgeon: Gearl Keens, MD;  Location: [facility] OR;  Service: Neurosurgery;  Laterality: Right;   REPAIR DURAL / CSF LEAK  2018   post op lumbar diskectomy   REVISION TOTAL KNEE ARTHROPLASTY Left 2015   TOTAL KNEE ARTHROPLASTY Left 2012   TRANSFORAMINAL LUMBAR INTERBODY FUSION (TLIF) WITH PEDICLE SCREW FIXATION 1 LEVEL N/A 06/12/2019   Procedure: LUMBAR FOUR-FIVE TRANSFORAMINAL LUMBAR INTERBODY FUSION (TLIF) WITH PEDICLE SCREW FIXATION;  Surgeon: Gearl Keens, MD;  Location: [facility] OR;  Service:  Neurosurgery;  Laterality: N/A;   UMBILICAL HERNIA REPAIR  1992    Social History   Tobacco Use   Smoking status: Never   Smokeless tobacco: Former    Types: Chew    Quit date: 03/2020  Vaping Use   Vaping status: Never Used  Substance Use Topics   Alcohol use: Yes    Comment: 1 bourbon monthly or less   Drug use: Never     Medication list has been reviewed and updated.  Current Meds  Medication Sig   apixaban  (ELIQUIS ) 5 MG TABS tablet Take 1 tablet (5 mg total) by mouth 2 (two) times daily. Restart Wednesday, November 16   celecoxib  (CELEBREX ) 200 MG capsule Take 200 mg by mouth daily.   diltiazem  (CARDIZEM  CD) 180 MG 24 hr capsule Take 1 capsule (180 mg total) by mouth 2 (two) times daily.   ferrous sulfate  325 (65 FE) MG tablet Take 325 mg by mouth daily with breakfast.   fexofenadine  (ALLEGRA ) 180 MG tablet Take 1 tablet (180 mg total) by mouth daily.   fluticasone  (FLONASE ) 50 MCG/ACT nasal spray Place 2 sprays into both nostrils daily.   hydrochlorothiazide  (HYDRODIURIL ) 12.5 MG tablet Take 1 tablet (12.5 mg total) by mouth daily.   KLOR-CON  M20 20 MEQ tablet TAKE 1 TABLET BY MOUTH THREE TIMES DAILY   metoprolol   succinate (TOPROL -XL) 50 MG 24 hr tablet Take 1 tablet (50 mg total) by mouth daily. cardio   montelukast  (SINGULAIR ) 10 MG tablet Take 1 tablet (10 mg total) by mouth at bedtime.   Multiple Vitamins-Minerals (MULTIVITAMIN MEN) TABS Take 1 tablet by mouth daily.   pantoprazole  (PROTONIX ) 40 MG tablet Take 1 tablet (40 mg total) by mouth every evening.   rosuvastatin  (CRESTOR ) 5 MG tablet TAKE 1 TABLET BY MOUTH THREE TIMES A WEEK   spironolactone  (ALDACTONE ) 50 MG tablet Take 50 mg by mouth daily.   tadalafil  (CIALIS ) 10 MG tablet Take 1 tablet (10 mg total) by mouth every other day. Okay to take an additional 10 mg boost dose as needed 45 minutes prior to sexual activity   tamsulosin  (FLOMAX ) 0.4 MG CAPS capsule Take 1 capsule (0.4 mg total) by mouth daily.    valsartan  (DIOVAN ) 320 MG tablet Take 1 tablet (320 mg total) by mouth daily.       02/12/2024    9:52 AM 09/15/2023    9:00 AM 09/01/2023   10:42 AM 03/06/2023    3:09 PM  GAD 7 : Generalized Anxiety Score  Nervous, Anxious, on Edge 0 0 0 0  Control/stop worrying 0 0 0 0  Worry too much - different things 0 0 0 0  Trouble relaxing 0 0 0 0  Restless 0 0 0 0  Easily annoyed or irritable 0 0 0 0  Afraid - awful might happen 0 0 0 0  Total GAD 7 Score 0 0 0 0  Anxiety Difficulty Not difficult at all Not difficult at all Not difficult at all Not difficult at all       02/12/2024    9:52 AM 10/05/2023    3:55 PM 09/15/2023    8:59 AM  Depression screen PHQ 2/9  Decreased Interest 0 0 0  Down, Depressed, Hopeless 0 0 0  PHQ - 2 Score 0 0 0  Altered sleeping 0 0 0  Tired, decreased energy 1 0 0  Change in appetite 0 0 0  Feeling bad or failure about yourself  0 0 0  Trouble concentrating 0 0 0  Moving slowly or fidgety/restless 0 0 0  Suicidal thoughts 0 0 0  PHQ-9 Score 1 0 0  Difficult doing work/chores Not difficult at all Not difficult at all Not difficult at all    BP Readings from Last 3 Encounters:  02/12/24 98/60  11/01/23 127/78  09/15/23 128/74    Physical Exam Vitals and nursing note reviewed.  Constitutional:      Appearance: He is well-developed.  HENT:     Head: Normocephalic and atraumatic.     Right Ear: Tympanic membrane, ear canal and external ear normal.     Left Ear: Tympanic membrane, ear canal and external ear normal.     Nose: Nose normal.     Mouth/Throat:     Dentition: Normal dentition.  Eyes:     General: Lids are normal. No scleral icterus.    Conjunctiva/sclera: Conjunctivae normal.     Pupils: Pupils are equal, round, and reactive to light.  Neck:     Thyroid : No thyromegaly.     Vascular: No carotid bruit, hepatojugular reflux or JVD.     Trachea: No tracheal deviation.  Cardiovascular:     Rate and Rhythm: Normal rate and regular  rhythm.     Heart sounds: Normal heart sounds. No murmur heard.    No gallop.  Pulmonary:  Effort: Pulmonary effort is normal.     Breath sounds: Normal breath sounds. No wheezing, rhonchi or rales.  Abdominal:     General: Bowel sounds are normal.     Palpations: Abdomen is soft. There is no hepatomegaly, splenomegaly or mass.     Tenderness: There is no abdominal tenderness.     Hernia: There is no hernia in the left inguinal area.  Musculoskeletal:        General: Normal range of motion.     Cervical back: Normal range of motion and neck supple.  Lymphadenopathy:     Cervical: No cervical adenopathy.  Skin:    General: Skin is warm and dry.     Findings: No rash.  Neurological:     Mental Status: He is alert and oriented to person, place, and time.     Sensory: No sensory deficit.     Deep Tendon Reflexes: Reflexes are normal and symmetric.  Psychiatric:        Mood and Affect: Mood is not anxious or depressed.     Wt Readings from Last 3 Encounters:  02/12/24 247 lb (112 kg)  11/01/23 245 lb (111.1 kg)  10/05/23 250 lb (113.4 kg)    BP 98/60   Pulse 61   Ht 6\' 1"  (1.854 m)   Wt 247 lb (112 kg)   SpO2 95%   BMI 32.59 kg/m   Assessment and Plan: 1. Gynecomastia, male New onset.  This occurred over the past 2 to 3 months.  Bilateral enlargement with palpable breast bud by patient with tenderness.  This is consistent with likely recent addition of spironolactone  to medication regimen by pulmonary for congestive heart failure.  This is probably suppressed testosterone  which is contributed not only to unopposed estrogen due to attenuation of testosterone  but since patient is gaining weight there is probably some degree of conversion of fat to estrogen.  Given that this was started because of his congestive heart failure we will refer to cardiology for evaluation and likely change in diuresis perhaps to furosemide or triamterene.  In the meantime we will obtain a TSH and  testosterone  including total and free but will await and draw it early in the morning tomorrow. - Ambulatory referral to Cardiology - Lipid Panel With LDL/HDL Ratio - Testosterone ,Free and Total  2. Fatigue, unspecified type (Primary) New onset.  Overall general malaise not feeling well and fatigue particularly since November.  Most likely this is been contributed to because of suppression of his testosterone  by the spironolactone  which was utilized to develop by diuresis for congestive heart failure.  Will check TSH with T4 along with total testosterone  and free testosterone  and total.. - TSH + free T4     Alayne Allis, MD

## 2024-02-14 ENCOUNTER — Encounter: Payer: Self-pay | Admitting: Family Medicine

## 2024-02-14 LAB — TSH+FREE T4
Free T4: 1.04 ng/dL (ref 0.82–1.77)
TSH: 1.56 u[IU]/mL (ref 0.450–4.500)

## 2024-02-14 LAB — LIPID PANEL WITH LDL/HDL RATIO
Cholesterol, Total: 156 mg/dL (ref 100–199)
HDL: 43 mg/dL (ref >39)
LDL Chol Calc (NIH): 97 mg/dL (ref 0–99)
LDL/HDL Ratio: 2.3 ratio (ref 0.0–3.6)
Triglycerides: 83 mg/dL (ref 0–149)
VLDL Cholesterol Cal: 16 mg/dL (ref 5–40)

## 2024-02-14 LAB — TESTOSTERONE,FREE AND TOTAL
Testosterone, Free: 6.1 pg/mL — ABNORMAL LOW (ref 6.6–18.1)
Testosterone: 539 ng/dL (ref 264–916)

## 2024-02-15 ENCOUNTER — Encounter: Payer: Self-pay | Admitting: Family Medicine

## 2024-02-28 ENCOUNTER — Other Ambulatory Visit: Payer: Self-pay | Admitting: Nurse Practitioner

## 2024-02-28 DIAGNOSIS — I10 Essential (primary) hypertension: Secondary | ICD-10-CM

## 2024-02-28 DIAGNOSIS — R5383 Other fatigue: Secondary | ICD-10-CM

## 2024-02-28 DIAGNOSIS — Z8249 Family history of ischemic heart disease and other diseases of the circulatory system: Secondary | ICD-10-CM

## 2024-02-28 DIAGNOSIS — E782 Mixed hyperlipidemia: Secondary | ICD-10-CM

## 2024-02-28 DIAGNOSIS — Z79899 Other long term (current) drug therapy: Secondary | ICD-10-CM

## 2024-03-01 ENCOUNTER — Ambulatory Visit: Payer: Self-pay | Admitting: Family Medicine

## 2024-03-04 ENCOUNTER — Encounter: Payer: Self-pay | Admitting: Family Medicine

## 2024-03-04 ENCOUNTER — Ambulatory Visit: Admitting: Family Medicine

## 2024-03-04 VITALS — BP 120/82 | HR 70 | Ht 73.0 in | Wt 251.0 lb

## 2024-03-04 DIAGNOSIS — E876 Hypokalemia: Secondary | ICD-10-CM

## 2024-03-04 DIAGNOSIS — J302 Other seasonal allergic rhinitis: Secondary | ICD-10-CM

## 2024-03-04 DIAGNOSIS — I10 Essential (primary) hypertension: Secondary | ICD-10-CM

## 2024-03-04 DIAGNOSIS — E782 Mixed hyperlipidemia: Secondary | ICD-10-CM

## 2024-03-04 DIAGNOSIS — K219 Gastro-esophageal reflux disease without esophagitis: Secondary | ICD-10-CM | POA: Diagnosis not present

## 2024-03-04 MED ORDER — POTASSIUM CHLORIDE CRYS ER 20 MEQ PO TBCR
20.0000 meq | EXTENDED_RELEASE_TABLET | Freq: Three times a day (TID) | ORAL | 1 refills | Status: AC
Start: 2024-03-04 — End: ?

## 2024-03-04 MED ORDER — MONTELUKAST SODIUM 10 MG PO TABS: 10.0000 mg | ORAL_TABLET | Freq: Every day | ORAL | 1 refills | Status: AC

## 2024-03-04 MED ORDER — DILTIAZEM HCL ER COATED BEADS 180 MG PO CP24
180.0000 mg | ORAL_CAPSULE | Freq: Two times a day (BID) | ORAL | 1 refills | Status: AC
Start: 2024-03-04 — End: ?

## 2024-03-04 MED ORDER — FEXOFENADINE HCL 180 MG PO TABS
180.0000 mg | ORAL_TABLET | Freq: Every day | ORAL | 1 refills | Status: AC
Start: 2024-03-04 — End: ?

## 2024-03-04 MED ORDER — ROSUVASTATIN CALCIUM 5 MG PO TABS: ORAL_TABLET | ORAL | 1 refills | Status: AC

## 2024-03-04 MED ORDER — PANTOPRAZOLE SODIUM 40 MG PO TBEC: 40.0000 mg | DELAYED_RELEASE_TABLET | Freq: Every evening | ORAL | 1 refills | Status: AC

## 2024-03-04 MED ORDER — METOPROLOL SUCCINATE ER 50 MG PO TB24: 50.0000 mg | ORAL_TABLET | Freq: Every day | ORAL | 1 refills | Status: AC

## 2024-03-04 NOTE — Patient Instructions (Signed)
 GUIDELINES FOR  LOW-CHOLESTEROL, LOW-TRIGLYCERIDE DIETS    FOODS TO USE   MEATS, FISH Choose lean meats (chicken, [place], veal, and non-fatty cuts of beef with excess fat trimmed; one serving = 3 oz of cooked meat). Also, fresh or frozen fish, canned fish packed in water, and shellfish (lobster, crabs, shrimp, and oysters). Limit use to no more than one serving of one of these per week. Shellfish are high in cholesterol but low in saturated fat and should be used sparingly. Meats and fish should be broiled (pan or oven) or baked on a rack.  EGGS Egg substitutes and egg whites (use freely). Egg yolks (limit two per week).  FRUITS Eat three servings of fresh fruit per day (1 serving =  cup). Be sure to have at least one citrus fruit daily. Frozen and canned fruit with no sugar or syrup added may be used.  VEGETABLES Most vegetables are not limited (see next page). One dark-green (string beans, escarole) or one deep yellow (squash) vegetable is recommended daily. Cauliflower, broccoli, and celery, as well as potato skins, are recommended for their fiber content. (Fiber is associated with cholesterol reduction) It is preferable to steam vegetables, but they may be boiled, strained, or braised with polyunsaturated vegetable oil (see below).  BEANS Dried peas or beans (1 serving =  cup) may be used as a bread substitute.  NUTS Almonds, walnuts, and peanuts may be used sparingly  (1 serving = 1 Tablespoonful). Use pumpkin, sesame, or sunflower seeds.  BREADS, GRAINS One roll or one slice of whole grain or enriched bread may be used, or three soda crackers or four pieces of melba toast as a substitute. Spaghetti, rice or noodles ( cup) or  large ear of corn may be used as a bread substitute. In preparing these foods do not use butter or shortening, use soft margarine. Also use egg and sugar substitutes.  Choose high fiber grains, such as oats and whole wheat.  CEREALS Use  cup of hot cereal or  cup of  cold cereal per day. Add a sugar substitute if desired, with 99% fat free or skim milk.  MILK PRODUCTS Always use 99% fat free or skim milk, dairy products such as low fat cheeses (farmer's uncreamed diet cottage), low-fat yogurt, and powdered skim milk.  FATS, OILS Use soft (not stick) margarine; vegetable oils that are high in polyunsaturated fats (such as safflower, sunflower, soybean, corn, and cottonseed). Always refrigerate meat drippings to harden the fat and remove it before preparing gravies  DESSERTS, SNACKS Limit to two servings per day; substitute each serving for a bread/cereal serving: ice milk, water sherbet (1/4 cup); unflavored gelatin or gelatin flavored with sugar substitute (1/3 cup); pudding prepared with skim milk (1/2 cup); egg white souffls; unbuttered popcorn (1  cups). Substitute carob for chocolate.  BEVERAGES Fresh fruit juices (limit 4 oz per day); black coffee, plain or herbal teas; soft drinks with sugar substitutes; club soda, preferably salt-free; cocoa made with skim milk or nonfat dried milk and water (sugar substitute added if desired); clear broth. Alcohol: limit two servings per day (see second page).  MISCELLANEOUS  You may use the following freely: vinegar, spices, herbs, nonfat bouillon, mustard, Worcestershire sauce, soy sauce, flavoring essence.                  GUIDELINES FOR  LOW-CHOLESTEROL, LOW TRIGLYCERIDE DIETS    FOODS TO AVOID   MEATS, FISH Marbled beef, pork, bacon, sausage, and other pork products; fatty  fowl (duck, goose); skin and fat of [place] and chicken; processed meats; luncheon meats (salami, bologna); frankfurters and fast-food hamburgers (theyre loaded with fat); organ meats (kidneys, liver); canned fish packed in oil.  EGGS Limit egg yolks to two per week.   FRUITS Coconuts (rich in saturated fats).  VEGETABLES Avoid avocados. Starchy vegetables (potatoes, corn, lima beans, dried peas, beans) may be used only if  substitutes for a serving of bread or cereal. (Baked potato skin, however, is desirable for its fiber content.  BEANS Commercial baked beans with sugar and/or pork added.  NUTS Avoid nuts.  Limit peanuts and walnuts to one tablespoonful per day.  BREADS, GRAINS Any baked goods with shortening and/or sugar. Commercial mixes with dried eggs and whole milk. Avoid sweet rolls, doughnuts, breakfast pastries (Danish), and sweetened packaged cereals (the added sugar converts readily to triglycerides).  MILK PRODUCTS Whole milk and whole-milk packaged goods; cream; ice cream; whole-milk puddings, yogurt, or cheeses; nondairy cream substitutes.  FATS, OILS Butter, lard, animal fats, bacon drippings, gravies, cream sauces as well as palm and coconut oils. All these are high in saturated fats. Examine labels on cholesterol free products for hydrogenated fats. (These are oils that have been hardened into solids and in the process have become saturated.)  DESSERTS, SNACKS Fried snack foods like potato chips; chocolate; candies in general; jams, jellies, syrups; whole- milk puddings; ice cream and milk sherbets; hydrogenated peanut butter.  BEVERAGES Sugared fruit juices and soft drinks; cocoa made with whole milk and/or sugar. When using alcohol (1 oz liquor, 5 oz beer, or 2  oz dry table wine per serving), one serving must be substituted for one bread or cereal serving (limit, two servings of alcohol per day).   SPECIAL NOTES    Remember that even non-limited foods should be used in moderation. While on a cholesterol-lowering diet, be sure to avoid animal fats and marbled meats. 3. While on a triglyceride-lowering diet, be sure to avoid sweets and to control the amount of carbohydrates you eat (starchy foods such as flour, bread, potatoes).While on a tri-glyceride-lowering diet, be sure to avoid sweets Buy a good low-fat cookbook, such as the one published by the American Heart Association. Consult your physician  if you have any questions.               [facility] Low Glycemic Diet Plan   Low Glycemic Foods (20-49) Moderate Glycemic Foods (50-69) High Glycemic Foods (70-100)      Breakfast Creals Breakfast Cereals Breakfast Cereals  All Bran All-Bran Fruit'n Oats   Bran Buds Bran Chex   Cheerios Corn chex    Fiber One Oatmeal (not instant)   Just Right Mini-Wheats   Corn Flakes Cream of Wheat    Oat Bran Special K Swiss Muesli   Grape Nuts Grape Nut Flakes      Grits Nutri-Grain    Fruits and fruit juice: Fruits Puffed Rice Puffed Wheat    (Limit to 1-2 Servings per day) Banana (under-ride) Dates   Rice Chex Rice Krispies    Apples Apricots (fresh/dried)   Figs Grapes   Shredded Wheat Team    Blackberries Blueberries   Kiwi Mango   Total     Cherries Cranberries   Oranges Raisins     Peaches Pears    Fruits  Plums Prunes   Fruit Juices Pineapple Watermelon    Grapefruit Raspberries   Cranberry Juice Orange Juice   Banana (over-ripe)     Strawberries Tangerines  Apple Juice Grapefruit Juice   Beans and Legumes Beverages  Tomato Juice    [place]-type baked beans Sodas, sweet tea, pineapple juice   Canned pinto, kidney, or navy beans   Beans and Legumes (fresh-cooked) Green peas Vegetables  Black-eyed peas Butter Beans    Potato, baked, boiled, fried, mashed  Chick peas Lentils   Vegetables [place] fries  Green beans Lima beans   Beets Carrots   Canned or frozen corn  Kidney beans Navy beans   Sweet potato Yam   Parsnips  Pinto beans Snow peas   Corn on the cob Winter squash      Non-starchy vegetables Grains Breads  Asparagus, avocado, broccoli, cabbage Cornmeal Rice, brown   Most breads (white and whole grain)  cauliflower, celery, cucumber, greens Rice, white Couscous   Bagels Bread sticks    lettuce, mushrooms, peppers, tomatoes  Bread stuffing Kaiser roll    okra, onions, spinach, summer squash Pasta Dinner rolls   [employer], cheese     Grains Ravioli, meat filled Spaghetti, white   Grains  Barley Bulgur    Rice, instant Tapioca, with milk    Rye Wild rice   Nuts    Cashews Macadamia   Candy and most cookies  Nuts and oils    Almonds, peanuts, sunflower seeds Snacks Snacks  hazelnuts, pecans, walnuts Chocolate Ice cream, lowfat   Donuts Corn chips    Oils that are liquid at room temperature Muffin Popcorn   Jelly beans Pretzels      Pastries  Dairy, fish, meat, soy, and eggs    Milk, skim Lowfat cheese    Restaurant and ethnic foods  Yogurt, lowfat, fruit sugar sweetened  Most [place] food (sugar in stir fry    or wok sauce)  Lean red meat Fish    Teriyaki-style meats and vegetables  Skinless chicken and [place], shellfish        Egg whites (up to 3 daily), Soy Products    Egg yolks (up to 7 or _____ per week)

## 2024-03-04 NOTE — Progress Notes (Signed)
 Date:  03/04/2024   Name:  [name]   DOB:  Dec 01, 1955   MRN:  [id number]   Chief Complaint: Medical Management of Chronic Issues (Patient presents today for a follow up on his HTN and for medication refills. He is doing well today and has no concerns for today's visit. )  Hyperlipidemia This is a chronic problem. The problem is resistant. Recent lipid tests were reviewed and are normal. He has no history of diabetes or hypothyroidism. Factors aggravating his hyperlipidemia include thiazides. Pertinent negatives include no chest pain, focal sensory loss, focal weakness, leg pain, myalgias or shortness of breath. Current antihyperlipidemic treatment includes statins. The current treatment provides moderate improvement of lipids. There are no compliance problems.  Risk factors for coronary artery disease include dyslipidemia and hypertension.  Hypertension This is a chronic problem. The current episode started more than 1 year ago. The problem has been gradually improving since onset. The problem is controlled. Pertinent negatives include no chest pain, headaches, orthopnea, palpitations, peripheral edema or shortness of breath. There are no associated agents to hypertension. Risk factors for coronary artery disease include dyslipidemia. Past treatments include ACE inhibitors, calcium  channel blockers and beta blockers. The current treatment provides moderate improvement.    Lab Results  Component Value Date   NA 133 (L) 03/24/2023   K 4.5 09/15/2023   CO2 23 03/24/2023   GLUCOSE 110 (H) 03/24/2023   BUN 16 03/24/2023   CREATININE 1.07 03/24/2023   CALCIUM  8.9 03/24/2023   EGFR 85 02/21/2022   GFRNONAA >60 03/24/2023   Lab Results  Component Value Date   CHOL 156 02/13/2024   HDL 43 02/13/2024   LDLCALC 97 02/13/2024   TRIG 83 02/13/2024   CHOLHDL 4.1 08/07/2019   Lab Results  Component Value Date   TSH 1.560 02/13/2024   Lab Results  Component Value Date   HGBA1C 6.4  (H) 03/01/2023   Lab Results  Component Value Date   WBC 5.0 02/23/2023   HGB 15.2 02/23/2023   HCT 44.7 02/23/2023   MCV 83.6 02/23/2023   PLT 197 02/23/2023   Lab Results  Component Value Date   ALT 16 06/15/2022   AST 13 06/15/2022   ALKPHOS 96 06/15/2022   BILITOT 0.4 06/15/2022   No results found for: "25OHVITD2", "25OHVITD3", "VD25OH"   Review of Systems  Constitutional:  Negative for chills, fatigue and fever.  HENT:  Negative for rhinorrhea.   Eyes:  Negative for photophobia and visual disturbance.  Respiratory:  Negative for apnea, cough, choking, chest tightness, shortness of breath, wheezing and stridor.   Cardiovascular:  Negative for chest pain, palpitations, orthopnea and leg swelling.  Gastrointestinal:  Negative for abdominal distention and abdominal pain.  Genitourinary:  Negative for hematuria.  Musculoskeletal:  Negative for myalgias.  Neurological:  Negative for focal weakness and headaches.    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   Hypokalemia 08/24/2021   Bacteremia due to Gram-negative bacteria    AF (paroxysmal atrial fibrillation) (HCC)    SIRS (systemic inflammatory response syndrome) (HCC) 07/27/2021   Sepsis secondary to UTI (HCC) 07/27/2021   Ureteral stricture 06/18/2021   Sacroiliitis (HCC) 05/07/2021   Spinal stenosis of lumbosacral region 01/18/2021   Herniated nucleus pulposus, lumbar 03/16/2020   Body mass index (BMI) 33.0-33.9, adult 09/03/2019   Other intervertebral disc displacement, lumbar region 06/25/2019   HNP (herniated nucleus pulposus), lumbar 06/12/2019   UTI  due to Klebsiella species 05/08/2019   Biceps tendonitis on right 07/27/2018   Spinal stenosis, 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   Lumbago with sciatica, unspecified side 11/14/2017   Primary osteoarthritis of right knee 10/12/2017   Taking multiple medications for chronic disease  07/30/2015   Reactive airway disease 07/01/2015   Allergic sinusitis 07/01/2015   Essential hypertension 07/01/2015   Valvular heart disease 07/01/2015   Metatarsalgia of left foot 06/10/2015   Hyperlipidemia, mixed 04/30/2015   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    Allergies  Allergen Reactions   Meloxicam Palpitations   Sulfa  Antibiotics Rash    Past Surgical History:  Procedure Laterality Date   ABDOMINAL EXPOSURE N/A 01/18/2021   Procedure: ABDOMINAL EXPOSURE;  Surgeon: Young Hensen, MD;  Location: [facility] OR;  Service: Vascular;  Laterality: N/A;   ANTERIOR LUMBAR FUSION N/A 01/18/2021   Procedure: Anterior Lumbar Interbody Fusion - Lumbar Five-Sarcal One;  Surgeon: Gearl Keens, MD;  Location: [facility] OR;  Service: Neurosurgery;  Laterality: N/A;  Anterior Lumbar Interbody Fusion - Lumbar Five-Sacral One   CYSTOSCOPY W/ URETERAL STENT PLACEMENT Left 06/18/2021   Procedure: CYSTOSCOPY WITH RETROGRADE PYELOGRAM/URETERAL STENT EXCHANGE;  Surgeon: Osborn Blaze, MD;  Location: [facility] ORS;  Service: Urology;  Laterality: Left;   CYSTOSCOPY WITH RETROGRADE PYELOGRAM, URETEROSCOPY AND STENT PLACEMENT Left 04/21/2021   Procedure: CYSTOSCOPY WITH RETROGRADE PYELOGRAM, DIAGNOSTICURETEROSCOPY AND STENT PLACEMENT;  Surgeon: Osborn Blaze, MD;  Location: [facility];  Service: Urology;  Laterality: Left;  1 HR   EXTRACORPOREAL SHOCK WAVE LITHOTRIPSY     approx 2002   LUMBAR DISC SURGERY  2018   LUMBAR LAMINECTOMY/DECOMPRESSION MICRODISCECTOMY Right 08/31/2022   Procedure: Redo foraminotomy right Lumbar four-five;  Surgeon: Gearl Keens, MD;  Location: [facility] OR;  Service: Neurosurgery;  Laterality: Right;   REPAIR DURAL / CSF LEAK  2018   post op lumbar diskectomy   REVISION TOTAL KNEE ARTHROPLASTY Left 2015   TOTAL KNEE ARTHROPLASTY Left 2012   TRANSFORAMINAL LUMBAR INTERBODY FUSION (TLIF) WITH PEDICLE SCREW FIXATION 1 LEVEL N/A 06/12/2019   Procedure: LUMBAR FOUR-FIVE TRANSFORAMINAL LUMBAR  INTERBODY FUSION (TLIF) WITH PEDICLE SCREW FIXATION;  Surgeon: Gearl Keens, MD;  Location: [facility] OR;  Service: Neurosurgery;  Laterality: N/A;   UMBILICAL HERNIA REPAIR  1992    Social History   Tobacco Use   Smoking status: Never   Smokeless tobacco: Former    Types: Chew    Quit date: 03/2020  Vaping Use   Vaping status: Never Used  Substance Use Topics   Alcohol use: Yes    Comment: 1 bourbon monthly or less   Drug use: Never     Medication list has been reviewed and updated.  Current Meds  Medication Sig   apixaban  (ELIQUIS ) 5 MG TABS tablet Take 1 tablet (5 mg total) by mouth 2 (two) times daily. Restart Wednesday, November 16   celecoxib  (CELEBREX ) 200 MG capsule Take 200 mg by mouth daily.   cyclobenzaprine  (FLEXERIL ) 5 MG tablet Take 5 mg by mouth 3 (three) times daily as needed.   diltiazem  (CARDIZEM  CD) 180 MG 24 hr capsule Take 1 capsule (180 mg total) by mouth 2 (two) times daily.   ferrous sulfate  325 (65 FE) MG tablet Take 325 mg by mouth daily with breakfast.   fexofenadine  (ALLEGRA ) 180 MG tablet Take 1 tablet (180 mg total) by mouth daily.   fluticasone  (FLONASE ) 50 MCG/ACT nasal spray Place 2 sprays into both nostrils daily.   Fluticasone -Umeclidin-Vilant (TRELEGY ELLIPTA) 100-62.5-25 MCG/ACT  AEPB Inhale into the lungs.   KLOR-CON  M20 20 MEQ tablet TAKE 1 TABLET BY MOUTH THREE TIMES DAILY   methocarbamol  (ROBAXIN ) 500 MG tablet Take by mouth.   metoprolol  succinate (TOPROL -XL) 50 MG 24 hr tablet Take 1 tablet (50 mg total) by mouth daily. cardio   montelukast  (SINGULAIR ) 10 MG tablet Take 1 tablet (10 mg total) by mouth at bedtime.   Multiple Vitamins-Minerals (MULTIVITAMIN MEN) TABS Take 1 tablet by mouth daily.   pantoprazole  (PROTONIX ) 40 MG tablet Take 1 tablet (40 mg total) by mouth every evening.   rosuvastatin  (CRESTOR ) 5 MG tablet TAKE 1 TABLET BY MOUTH THREE TIMES A WEEK   tadalafil  (CIALIS ) 10 MG tablet Take 1 tablet (10 mg total) by mouth every other  day. Okay to take an additional 10 mg boost dose as needed 45 minutes prior to sexual activity   tamsulosin  (FLOMAX ) 0.4 MG CAPS capsule Take 1 capsule (0.4 mg total) by mouth daily.   traMADol  (ULTRAM ) 50 MG tablet Take 50 mg by mouth every 6 (six) hours as needed.   valsartan  (DIOVAN ) 320 MG tablet Take 1 tablet (320 mg total) by mouth daily. (Patient taking differently: Take 160 mg by mouth daily.)       02/12/2024    9:52 AM 09/15/2023    9:00 AM 09/01/2023   10:42 AM 03/06/2023    3:09 PM  GAD 7 : Generalized Anxiety Score  Nervous, Anxious, on Edge 0 0 0 0  Control/stop worrying 0 0 0 0  Worry too much - different things 0 0 0 0  Trouble relaxing 0 0 0 0  Restless 0 0 0 0  Easily annoyed or irritable 0 0 0 0  Afraid - awful might happen 0 0 0 0  Total GAD 7 Score 0 0 0 0  Anxiety Difficulty Not difficult at all Not difficult at all Not difficult at all Not difficult at all       02/12/2024    9:52 AM 10/05/2023    3:55 PM 09/15/2023    8:59 AM  Depression screen PHQ 2/9  Decreased Interest 0 0 0  Down, Depressed, Hopeless 0 0 0  PHQ - 2 Score 0 0 0  Altered sleeping 0 0 0  Tired, decreased energy 1 0 0  Change in appetite 0 0 0  Feeling bad or failure about yourself  0 0 0  Trouble concentrating 0 0 0  Moving slowly or fidgety/restless 0 0 0  Suicidal thoughts 0 0 0  PHQ-9 Score 1 0 0  Difficult doing work/chores Not difficult at all Not difficult at all Not difficult at all    BP Readings from Last 3 Encounters:  03/04/24 120/82  02/12/24 98/60  11/01/23 127/78    Physical Exam Vitals and nursing note reviewed.  Constitutional:      Appearance: He is well-developed.  HENT:     Head: Normocephalic and atraumatic.     Right Ear: Tympanic membrane, ear canal and external ear normal.     Left Ear: Tympanic membrane, ear canal and external ear normal.     Nose: Nose normal. No congestion or rhinorrhea.     Mouth/Throat:     Mouth: Mucous membranes are moist.      Dentition: Normal dentition.  Eyes:     General: Lids are normal. No scleral icterus.    Conjunctiva/sclera: Conjunctivae normal.     Pupils: Pupils are equal, round, and reactive to light.  Neck:  Thyroid : No thyromegaly.     Vascular: No carotid bruit, hepatojugular reflux or JVD.     Trachea: No tracheal deviation.  Cardiovascular:     Rate and Rhythm: Normal rate and regular rhythm.     Heart sounds: Normal heart sounds.  Pulmonary:     Effort: Pulmonary effort is normal.     Breath sounds: Normal breath sounds. No wheezing, rhonchi or rales.  Abdominal:     General: Bowel sounds are normal.     Palpations: Abdomen is soft. There is no hepatomegaly, splenomegaly or mass.     Tenderness: There is no abdominal tenderness.     Hernia: There is no hernia in the left inguinal area.  Musculoskeletal:        General: Normal range of motion.     Cervical back: Normal range of motion and neck supple.  Lymphadenopathy:     Cervical: No cervical adenopathy.  Skin:    General: Skin is warm and dry.     Findings: No rash.  Neurological:     Mental Status: He is alert and oriented to person, place, and time.     Sensory: No sensory deficit.     Deep Tendon Reflexes: Reflexes are normal and symmetric.  Psychiatric:        Mood and Affect: Mood is not anxious or depressed.     Wt Readings from Last 3 Encounters:  03/04/24 251 lb (113.9 kg)  02/12/24 247 lb (112 kg)  11/01/23 245 lb (111.1 kg)    BP 120/82   Pulse 70   Ht 6\' 1"  (1.854 m)   Wt 251 lb (113.9 kg)   SpO2 95%   BMI 33.12 kg/m   Assessment and Plan:  1. Essential hypertension (Primary) Chronic.  Controlled.  Stable.  Recently is being followed with adjustment of medications by Dr. Beau Bound cardiology [place] [facility].  Currently is on Cardizem  CD 180 mg milligrams valsartan  180 mg and an for rate control for atrial fibrillation metoprolol  XL 50 mg once a day and flecainide  100 mg twice a day.  Due to some  gynecomastia patient recently had his hydrochlorothiazide  and spironolactone  discontinued and has been placed on triamterene hydrochlorothiazide  1/2 tablet daily to notify Dr. Beau Bound if he should become dizziness or have worsening of his leg swelling.  He is to be rechecked in 2 weeks which is approximately next week and they are contemplating doing a sleep study with Dr. Carmela Chin off. - diltiazem  (CARDIZEM  CD) 180 MG 24 hr capsule; Take 1 capsule (180 mg total) by mouth 2 (two) times daily.  Dispense: 180 capsule; Refill: 1 - metoprolol  succinate (TOPROL -XL) 50 MG 24 hr tablet; Take 1 tablet (50 mg total) by mouth daily. cardio  Dispense: 90 tablet; Refill: 1  2. Other seasonal allergic rhinitis Patient is followed for seasonal allergic rhinitis which includes montelukast  10 mg once a day and fexofenadine  180 mg once a day.  Patient is currently on potassium sustained-release and even though he is now on Dyazide he is still taking 1 tablet in the morning and I believe 2 tablets in the hide for total of 60 mEq daily. - fexofenadine  (ALLEGRA ) 180 MG tablet; Take 1 tablet (180 mg total) by mouth daily.  Dispense: 90 tablet; Refill: 1  3. Hypokalemia Patient is currently on potassium chloride  supplementation 60 mg daily which is divided 1 in the morning and 2 at night.  He is tolerating this and has recently been switched over to Dyazide which may  need to necessitate this being adjusted as well. - potassium chloride  SA (KLOR-CON  M20) 20 MEQ tablet; Take 1 tablet (20 mEq total) by mouth 3 (three) times daily.  Dispense: 270 tablet; Refill: 1  4. Seasonal allergic rhinitis, unspecified trigger Patient has seasonal allergic rhinitis and is currently being followed with Singulair  10 mg once a day and Allegra  180 mg once a day.  As well as Flonase  nasal spray 2 sprays each nostril daily. - montelukast  (SINGULAIR ) 10 MG tablet; Take 1 tablet (10 mg total) by mouth at bedtime.  Dispense: 90 tablet; Refill:  1  5. Gastroesophageal reflux disease, unspecified whether esophagitis present Chronic.  Controlled.  Stable.  Asymptomatic.  Symptomatic relief with pantoprazole  40 mg once a day and will continue at current dosing. - pantoprazole  (PROTONIX ) 40 MG tablet; Take 1 tablet (40 mg total) by mouth every evening.  Dispense: 90 tablet; Refill: 1  6. Mixed hyperlipidemia Chronic.  Controlled.  Stable.  Asymptomatic.  Without myalgias or muscle weakness.  Patient will continue rosuvastatin  5 mg 3 times a week.  Will recheck lipid panel next visit in about 6 months.  I have also encouraged him to use low-cholesterol low triglyceride dietary guidelines. - rosuvastatin  (CRESTOR ) 5 MG tablet; TAKE 1 TABLET BY MOUTH THREE TIMES A WEEK  Dispense: 90 tablet; Refill: 1    Alayne Allis, MD

## 2024-03-06 ENCOUNTER — Ambulatory Visit
Admission: RE | Admit: 2024-03-06 | Discharge: 2024-03-06 | Disposition: A | Discharge status: Home / Self Care | Payer: Self-pay | Source: Ambulatory Visit | Attending: Nurse Practitioner | Admitting: Nurse Practitioner

## 2024-03-06 DIAGNOSIS — R5383 Other fatigue: Secondary | ICD-10-CM | POA: Insufficient documentation

## 2024-03-06 DIAGNOSIS — E782 Mixed hyperlipidemia: Secondary | ICD-10-CM | POA: Insufficient documentation

## 2024-03-06 DIAGNOSIS — Z79899 Other long term (current) drug therapy: Secondary | ICD-10-CM | POA: Insufficient documentation

## 2024-03-06 DIAGNOSIS — I10 Essential (primary) hypertension: Secondary | ICD-10-CM | POA: Insufficient documentation

## 2024-03-06 DIAGNOSIS — Z5181 Encounter for therapeutic drug level monitoring: Secondary | ICD-10-CM | POA: Insufficient documentation

## 2024-03-06 DIAGNOSIS — Z8249 Family history of ischemic heart disease and other diseases of the circulatory system: Secondary | ICD-10-CM | POA: Insufficient documentation

## 2024-03-19 ENCOUNTER — Other Ambulatory Visit: Payer: Self-pay | Admitting: Nurse Practitioner

## 2024-03-19 DIAGNOSIS — Z8249 Family history of ischemic heart disease and other diseases of the circulatory system: Secondary | ICD-10-CM

## 2024-03-19 DIAGNOSIS — I10 Essential (primary) hypertension: Secondary | ICD-10-CM

## 2024-03-19 DIAGNOSIS — I480 Paroxysmal atrial fibrillation: Secondary | ICD-10-CM

## 2024-03-19 DIAGNOSIS — R931 Abnormal findings on diagnostic imaging of heart and coronary circulation: Secondary | ICD-10-CM

## 2024-03-19 DIAGNOSIS — I77819 Aortic ectasia, unspecified site: Secondary | ICD-10-CM

## 2024-03-19 DIAGNOSIS — I2510 Atherosclerotic heart disease of native coronary artery without angina pectoris: Secondary | ICD-10-CM

## 2024-03-19 DIAGNOSIS — E782 Mixed hyperlipidemia: Secondary | ICD-10-CM

## 2024-03-20 ENCOUNTER — Other Ambulatory Visit: Payer: Self-pay | Admitting: Nurse Practitioner

## 2024-03-20 DIAGNOSIS — R931 Abnormal findings on diagnostic imaging of heart and coronary circulation: Secondary | ICD-10-CM

## 2024-03-20 DIAGNOSIS — I10 Essential (primary) hypertension: Secondary | ICD-10-CM

## 2024-03-20 DIAGNOSIS — I2510 Atherosclerotic heart disease of native coronary artery without angina pectoris: Secondary | ICD-10-CM

## 2024-03-20 DIAGNOSIS — I480 Paroxysmal atrial fibrillation: Secondary | ICD-10-CM

## 2024-03-20 DIAGNOSIS — I77819 Aortic ectasia, unspecified site: Secondary | ICD-10-CM

## 2024-03-20 DIAGNOSIS — E782 Mixed hyperlipidemia: Secondary | ICD-10-CM

## 2024-03-20 DIAGNOSIS — Z8249 Family history of ischemic heart disease and other diseases of the circulatory system: Secondary | ICD-10-CM

## 2024-04-01 ENCOUNTER — Encounter

## 2024-04-03 ENCOUNTER — Encounter: Payer: Self-pay | Admitting: Physician Assistant

## 2024-04-03 ENCOUNTER — Other Ambulatory Visit

## 2024-04-03 ENCOUNTER — Ambulatory Visit: Payer: Self-pay

## 2024-04-03 ENCOUNTER — Ambulatory Visit: Admitting: Physician Assistant

## 2024-04-03 VITALS — BP 132/78 | HR 55 | Temp 98.3°F | Ht 73.0 in | Wt 252.0 lb

## 2024-04-03 DIAGNOSIS — H6992 Unspecified Eustachian tube disorder, left ear: Secondary | ICD-10-CM

## 2024-04-03 NOTE — Telephone Encounter (Signed)
 Noted  Pt has a appt.  KP

## 2024-04-03 NOTE — Telephone Encounter (Signed)
 FYI Only or Action Required?: FYI only for provider  Patient was last seen in primary care on 03/04/2024 by Clarise Crooks, MD. Called Nurse Triage reporting Otalgia. Symptoms began several days ago. Interventions attempted: Nothing. Symptoms are: unchanged.  Triage Disposition: See PCP When Office is Open (Within 3 Days)  Patient/caregiver understands and will follow disposition?: Yes, will follow disposition  Copied from CRM [id number]. Topic: Clinical - Red Word Triage >> Apr 03, 2024  7:38 AM Juluis Ok wrote: [facility] that prompted transfer to Nurse Triage: lt ear pain Reason for Disposition  Ear congestion present > 48 hours  Answer Assessment - Initial Assessment Questions 1. LOCATION: Which ear is involved?       L 2. SENSATION: Describe how the ear feels. (e.g. stuffy, full, plugged).      stuffed 3. ONSET:  When did the ear symptoms start?       4 days 4. PAIN: Do you also have an earache? If Yes, ask: How bad is it? (Scale 1-10; or mild, moderate, severe)     uncomfortable 5. CAUSE: What do you think is causing the ear congestion?     Never had this before, does wear hearing aids, maybe wax 6. URI: Do you have a runny nose or cough?      denies 7. NASAL ALLERGIES: Are there symptoms of hay fever, such as sneezing or a clear nasal discharge?     Takes 2 different allergy medications  Protocols used: Ear - Congestion-A-AH

## 2024-04-03 NOTE — Progress Notes (Signed)
 Date:  04/03/2024   Name:  [name]   DOB:  Jul 15, 1956   MRN:  [id number]   Chief Complaint: Ear Fullness (Left ear)  Ear Fullness  There is pain in the left ear. This is a new problem. Episode onset: X6 days. The problem occurs constantly. There has been no fever. The pain is at a severity of 3/10. The pain is mild.   Tim presents today for evaluation of left ear discomfort [place] with no other symptoms. History of allergies, presently using Allegra  and Singulair , typically uses Flonase  in the spring but not recently. He is planning to go to [place] for a week so wants to make sure he is not developing an infection.    Medication list has been reviewed and updated.  Current Meds  Medication Sig   acetaminophen  (TYLENOL ) 325 MG tablet Take 650 mg by mouth every 6 (six) hours as needed for moderate pain (pain score 4-6).   apixaban  (ELIQUIS ) 5 MG TABS tablet Take 1 tablet (5 mg total) by mouth 2 (two) times daily. Restart Wednesday, November 16   celecoxib  (CELEBREX ) 200 MG capsule Take 200 mg by mouth daily.   cyclobenzaprine  (FLEXERIL ) 5 MG tablet Take 5 mg by mouth 3 (three) times daily as needed.   diltiazem  (CARDIZEM  CD) 180 MG 24 hr capsule Take 1 capsule (180 mg total) by mouth 2 (two) times daily.   ezetimibe (ZETIA) 10 MG tablet Take 1 tablet by mouth daily.   ferrous sulfate  325 (65 FE) MG tablet Take 325 mg by mouth daily with breakfast.   fexofenadine  (ALLEGRA ) 180 MG tablet Take 1 tablet (180 mg total) by mouth daily.   fluticasone  (FLONASE ) 50 MCG/ACT nasal spray Place 2 sprays into both nostrils daily.   Fluticasone -Umeclidin-Vilant (TRELEGY ELLIPTA) 100-62.5-25 MCG/ACT AEPB Inhale into the lungs.   methocarbamol  (ROBAXIN ) 500 MG tablet Take by mouth.   metoprolol  succinate (TOPROL -XL) 50 MG 24 hr tablet Take 1 tablet (50 mg total) by mouth daily. cardio   montelukast  (SINGULAIR ) 10 MG tablet Take 1 tablet (10  mg total) by mouth at bedtime.   Multiple Vitamins-Minerals (MULTIVITAMIN MEN) TABS Take 1 tablet by mouth daily.   pantoprazole  (PROTONIX ) 40 MG tablet Take 1 tablet (40 mg total) by mouth every evening.   potassium chloride  SA (KLOR-CON  M20) 20 MEQ tablet Take 1 tablet (20 mEq total) by mouth 3 (three) times daily.   rosuvastatin  (CRESTOR ) 5 MG tablet TAKE 1 TABLET BY MOUTH THREE TIMES A WEEK   tadalafil  (CIALIS ) 10 MG tablet Take 1 tablet (10 mg total) by mouth every other day. Okay to take an additional 10 mg boost dose as needed 45 minutes prior to sexual activity   tamsulosin  (FLOMAX ) 0.4 MG CAPS capsule Take 1 capsule (0.4 mg total) by mouth daily.   traMADol  (ULTRAM ) 50 MG tablet Take 50 mg by mouth every 6 (six) hours as needed.   valsartan  (DIOVAN ) 320 MG tablet Take 1 tablet (320 mg total) by mouth daily. 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   Hypokalemia 08/24/2021   Bacteremia due to Gram-negative bacteria    AF (paroxysmal atrial fibrillation) (HCC)    SIRS (systemic inflammatory response syndrome) (HCC) 07/27/2021   Sepsis secondary to UTI (HCC) 07/27/2021   Ureteral  stricture 06/18/2021   Sacroiliitis (HCC) 05/07/2021   Spinal stenosis of lumbosacral region 01/18/2021   Herniated nucleus pulposus, lumbar 03/16/2020   Body mass index (BMI) 33.0-33.9, adult 09/03/2019   Other intervertebral disc displacement, lumbar region 06/25/2019   HNP (herniated nucleus pulposus), lumbar 06/12/2019   UTI due to Klebsiella species 05/08/2019   Biceps tendonitis on right 07/27/2018   Spinal stenosis, 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   Lumbago with sciatica, unspecified side 11/14/2017   Primary osteoarthritis of right knee 10/12/2017   Taking multiple medications for chronic disease 07/30/2015   Reactive airway disease  07/01/2015   Allergic sinusitis 07/01/2015   Essential hypertension 07/01/2015   Valvular heart disease 07/01/2015   Metatarsalgia of left foot 06/10/2015   Hyperlipidemia, mixed 04/30/2015   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 Sulfonamide Containing Compounds Dermatitis   Spironolactone  Other (See Comments)    gynecomastia   Meloxicam Palpitations and Other (See Comments)    meloxicam   Sulfa  Antibiotics Rash    Immunization History  Administered Date(s) Administered   Fluad Quad(high Dose 65+) 08/24/2021   Influenza, High Dose Seasonal PF 07/23/2019   Influenza,inj,Quad PF,6+ Mos 07/30/2015, 07/26/2016, 09/18/2018, 06/21/2022   Influenza-Unspecified 07/24/2020, 08/15/2023   Moderna Sars-Covid-2 Vaccination 10/22/2019, 11/22/2019, 09/09/2020   PNEUMOCOCCAL CONJUGATE-20 08/24/2022   Pneumococcal Conjugate-13 08/07/2019   Pneumococcal Polysaccharide-23 07/30/2015, 12/16/2020   Tdap 06/21/2012    Past Surgical History:  Procedure Laterality Date   ABDOMINAL EXPOSURE N/A 01/18/2021   Procedure: ABDOMINAL EXPOSURE;  Surgeon: Young Hensen, MD;  Location: [facility] OR;  Service: Vascular;  Laterality: N/A;   ANTERIOR LUMBAR FUSION N/A 01/18/2021   Procedure: Anterior Lumbar Interbody Fusion - Lumbar Five-Sarcal One;  Surgeon: Gearl Keens, MD;  Location[facility] OR;  Service: Neurosurgery;  Laterality: N/A;  Anterior Lumbar Interbody Fusion - Lumbar Five-Sacral One   CYSTOSCOPY W/ URETERAL STENT PLACEMENT Left 06/18/2021   Procedure: CYSTOSCOPY WITH RETROGRADE PYELOGRAM/URETERAL STENT EXCHANGE;  Surgeon: Osborn Blaze, MD;  Location: [facility] ORS;  Service: Urology;  Laterality: Left;   CYSTOSCOPY WITH RETROGRADE PYELOGRAM, URETEROSCOPY AND STENT PLACEMENT Left 04/21/2021   Procedure: CYSTOSCOPY WITH RETROGRADE PYELOGRAM, DIAGNOSTICURETEROSCOPY AND STENT PLACEMENT;  Surgeon: Osborn Blaze, MD;  Location: [facility];  Service: Urology;   Laterality: Left;  1 HR   EXTRACORPOREAL SHOCK WAVE LITHOTRIPSY     approx 2002   LUMBAR DISC SURGERY  2018   LUMBAR LAMINECTOMY/DECOMPRESSION MICRODISCECTOMY Right 08/31/2022   Procedure: Redo foraminotomy right Lumbar four-five;  Surgeon: Gearl Keens, MD;  Location: [facility] OR;  Service: Neurosurgery;  Laterality: Right;   REPAIR DURAL / CSF LEAK  2018   post op lumbar diskectomy   REVISION TOTAL KNEE ARTHROPLASTY Left 2015   TOTAL KNEE ARTHROPLASTY Left 2012   TRANSFORAMINAL LUMBAR INTERBODY FUSION (TLIF) WITH PEDICLE SCREW FIXATION 1 LEVEL N/A 06/12/2019   Procedure: LUMBAR FOUR-FIVE TRANSFORAMINAL LUMBAR INTERBODY FUSION (TLIF) WITH PEDICLE SCREW FIXATION;  Surgeon: Gearl Keens, MD;  Location: [facility] OR;  Service: Neurosurgery;  Laterality: N/A;   UMBILICAL HERNIA REPAIR  1992    Social History   Tobacco Use   Smoking status: Never   Smokeless tobacco: Former    Types: Chew    Quit date: 03/2020  Vaping Use   Vaping status: Never Used  Substance Use Topics   Alcohol use: Yes    Comment: 1 bourbon monthly or less   Drug use: Never    Family History  Problem  Relation Age of Onset   Cancer Mother    Cancer Father    Cancer Brother    Hyperlipidemia Brother         02/12/2024    9:52 AM 09/15/2023    9:00 AM 09/01/2023   10:42 AM 03/06/2023    3:09 PM  GAD 7 : Generalized Anxiety Score  Nervous, Anxious, on Edge 0 0 0 0  Control/stop worrying 0 0 0 0  Worry too much - different things 0 0 0 0  Trouble relaxing 0 0 0 0  Restless 0 0 0 0  Easily annoyed or irritable 0 0 0 0  Afraid - awful might happen 0 0 0 0  Total GAD 7 Score 0 0 0 0  Anxiety Difficulty Not difficult at all Not difficult at all Not difficult at all Not difficult at all       02/12/2024    9:52 AM 10/05/2023    3:55 PM 09/15/2023    8:59 AM  Depression screen PHQ 2/9  Decreased Interest 0 0 0  Down, Depressed, Hopeless 0 0 0  PHQ - 2 Score 0 0 0  Altered sleeping 0 0 0  Tired, decreased energy 1 0 0   Change in appetite 0 0 0  Feeling bad or failure about yourself  0 0 0  Trouble concentrating 0 0 0  Moving slowly or fidgety/restless 0 0 0  Suicidal thoughts 0 0 0  PHQ-9 Score 1 0 0  Difficult doing work/chores Not difficult at all Not difficult at all Not difficult at all    BP Readings from Last 3 Encounters:  04/03/24 132/78  03/04/24 120/82  02/12/24 98/60    Wt Readings from Last 3 Encounters:  04/03/24 252 lb (114.3 kg)  03/04/24 251 lb (113.9 kg)  02/12/24 247 lb (112 kg)    BP 132/78   Pulse (!) 55   Temp 98.3 F (36.8 C)   Ht 6' 1 (1.854 m)   Wt 252 lb (114.3 kg)   SpO2 96%   BMI 33.25 kg/m   Physical Exam Vitals and nursing note reviewed.  Constitutional:      Appearance: Normal appearance.  HENT:     Left Ear: Ear canal normal.  No middle ear effusion. There is no impacted cerumen.     Ears:     Comments: Left TM essentially normal although with trace erythema Cardiovascular:     Rate and Rhythm: Normal rate.  Pulmonary:     Effort: Pulmonary effort is normal.  Abdominal:     General: There is no distension.  Musculoskeletal:        General: Normal range of motion.  Skin:    General: Skin is warm and dry.  Neurological:     Mental Status: He is alert and oriented to person, place, and time.     Gait: Gait is intact.  Psychiatric:        Mood and Affect: Mood and affect normal.     Recent Labs     Component Value Date/Time   NA 133 (L) 03/24/2023 1643   NA 144 02/21/2022 0926   K 4.5 09/15/2023 1028   CL 103 03/24/2023 1643   CO2 23 03/24/2023 1643   GLUCOSE 110 (H) 03/24/2023 1643   BUN 16 03/24/2023 1643   BUN 16 02/21/2022 0926   CREATININE 1.07 03/24/2023 1643   CREATININE 1.31 (H) 12/04/2013 1550   CALCIUM  8.9 03/24/2023 1643   PROT 6.8  06/15/2022 0811   ALBUMIN  3.9 03/24/2023 1643   ALBUMIN  4.3 06/15/2022 0811   AST 13 06/15/2022 0811   ALT 16 06/15/2022 0811   ALKPHOS 96 06/15/2022 0811   BILITOT 0.4 06/15/2022 0811    GFRNONAA >60 03/24/2023 1643   GFRNONAA 59 (L) 12/04/2013 1550   GFRAA 103 04/06/2020 0909   GFRAA >60 12/04/2013 1550    Lab Results  Component Value Date   WBC 5.0 02/23/2023   HGB 15.2 02/23/2023   HCT 44.7 02/23/2023   MCV 83.6 02/23/2023   PLT 197 02/23/2023   Lab Results  Component Value Date   HGBA1C 6.4 (H) 03/01/2023   Lab Results  Component Value Date   CHOL 156 02/13/2024   HDL 43 02/13/2024   LDLCALC 97 02/13/2024   TRIG 83 02/13/2024   CHOLHDL 4.1 08/07/2019   Lab Results  Component Value Date   TSH 1.560 02/13/2024     Assessment and Plan:  1. Eustachian tube dysfunction, left (Primary) Continue oral antihistamine and montelukast .  Consider adding back Flonase  to his regimen.  Patient reassured no sign of infection today, however should symptoms dramatically worsen over the next few days, he is to let us  know via MyChart or by calling the office.     No follow-ups on file.    Cody Das, PA-C, DMSc, Nutritionist [facility] [phone]

## 2024-04-09 ENCOUNTER — Other Ambulatory Visit: Payer: Self-pay | Admitting: Nurse Practitioner

## 2024-04-09 DIAGNOSIS — I480 Paroxysmal atrial fibrillation: Secondary | ICD-10-CM

## 2024-04-09 DIAGNOSIS — E782 Mixed hyperlipidemia: Secondary | ICD-10-CM

## 2024-04-09 DIAGNOSIS — I77819 Aortic ectasia, unspecified site: Secondary | ICD-10-CM

## 2024-04-09 DIAGNOSIS — I10 Essential (primary) hypertension: Secondary | ICD-10-CM

## 2024-04-09 DIAGNOSIS — I2510 Atherosclerotic heart disease of native coronary artery without angina pectoris: Secondary | ICD-10-CM

## 2024-04-09 DIAGNOSIS — Z8249 Family history of ischemic heart disease and other diseases of the circulatory system: Secondary | ICD-10-CM

## 2024-04-09 DIAGNOSIS — R931 Abnormal findings on diagnostic imaging of heart and coronary circulation: Secondary | ICD-10-CM

## 2024-04-15 ENCOUNTER — Other Ambulatory Visit

## 2024-04-15 ENCOUNTER — Ambulatory Visit

## 2024-04-25 ENCOUNTER — Other Ambulatory Visit

## 2024-05-30 ENCOUNTER — Encounter: Payer: Self-pay | Admitting: Urology

## 2024-06-03 DIAGNOSIS — I2510 Atherosclerotic heart disease of native coronary artery without angina pectoris: Secondary | ICD-10-CM | POA: Diagnosis not present

## 2024-06-03 DIAGNOSIS — I77810 Thoracic aortic ectasia: Secondary | ICD-10-CM | POA: Diagnosis not present

## 2024-06-03 DIAGNOSIS — I480 Paroxysmal atrial fibrillation: Secondary | ICD-10-CM | POA: Diagnosis not present

## 2024-06-03 DIAGNOSIS — I10 Essential (primary) hypertension: Secondary | ICD-10-CM | POA: Diagnosis not present

## 2024-06-03 DIAGNOSIS — Z7901 Long term (current) use of anticoagulants: Secondary | ICD-10-CM | POA: Diagnosis not present

## 2024-06-06 DIAGNOSIS — L578 Other skin changes due to chronic exposure to nonionizing radiation: Secondary | ICD-10-CM | POA: Diagnosis not present

## 2024-06-06 DIAGNOSIS — L853 Xerosis cutis: Secondary | ICD-10-CM | POA: Diagnosis not present

## 2024-06-06 DIAGNOSIS — Z872 Personal history of diseases of the skin and subcutaneous tissue: Secondary | ICD-10-CM | POA: Diagnosis not present

## 2024-06-06 DIAGNOSIS — L570 Actinic keratosis: Secondary | ICD-10-CM | POA: Diagnosis not present

## 2024-06-06 DIAGNOSIS — D485 Neoplasm of uncertain behavior of skin: Secondary | ICD-10-CM | POA: Diagnosis not present

## 2024-06-06 DIAGNOSIS — D492 Neoplasm of unspecified behavior of bone, soft tissue, and skin: Secondary | ICD-10-CM | POA: Diagnosis not present

## 2024-06-06 DIAGNOSIS — Z859 Personal history of malignant neoplasm, unspecified: Secondary | ICD-10-CM | POA: Diagnosis not present

## 2024-06-06 DIAGNOSIS — L300 Nummular dermatitis: Secondary | ICD-10-CM | POA: Diagnosis not present

## 2024-06-06 DIAGNOSIS — C44729 Squamous cell carcinoma of skin of left lower limb, including hip: Secondary | ICD-10-CM | POA: Diagnosis not present

## 2024-06-06 DIAGNOSIS — Z85828 Personal history of other malignant neoplasm of skin: Secondary | ICD-10-CM | POA: Diagnosis not present

## 2024-06-12 DIAGNOSIS — J45909 Unspecified asthma, uncomplicated: Secondary | ICD-10-CM | POA: Diagnosis not present

## 2024-06-12 DIAGNOSIS — I10 Essential (primary) hypertension: Secondary | ICD-10-CM | POA: Diagnosis not present

## 2024-06-12 DIAGNOSIS — I480 Paroxysmal atrial fibrillation: Secondary | ICD-10-CM | POA: Diagnosis not present

## 2024-06-12 DIAGNOSIS — E782 Mixed hyperlipidemia: Secondary | ICD-10-CM | POA: Diagnosis not present

## 2024-06-12 DIAGNOSIS — Z7901 Long term (current) use of anticoagulants: Secondary | ICD-10-CM | POA: Diagnosis not present

## 2024-06-12 DIAGNOSIS — R600 Localized edema: Secondary | ICD-10-CM | POA: Diagnosis not present

## 2024-06-12 DIAGNOSIS — I2510 Atherosclerotic heart disease of native coronary artery without angina pectoris: Secondary | ICD-10-CM | POA: Diagnosis not present

## 2024-06-12 DIAGNOSIS — J811 Chronic pulmonary edema: Secondary | ICD-10-CM | POA: Diagnosis not present

## 2024-06-18 DIAGNOSIS — H1089 Other conjunctivitis: Secondary | ICD-10-CM | POA: Diagnosis not present

## 2024-06-21 DIAGNOSIS — L03213 Periorbital cellulitis: Secondary | ICD-10-CM | POA: Diagnosis not present

## 2024-06-26 ENCOUNTER — Other Ambulatory Visit: Payer: Self-pay

## 2024-06-26 ENCOUNTER — Encounter: Attending: Internal Medicine

## 2024-06-26 DIAGNOSIS — Z955 Presence of coronary angioplasty implant and graft: Secondary | ICD-10-CM | POA: Insufficient documentation

## 2024-06-26 NOTE — Progress Notes (Signed)
 Initial phone call completed. Diagnosis can be found in [facility] 06/12/2024. EP Orientation scheduled for Thursday, June 27, 2024, @ 0800.

## 2024-06-27 ENCOUNTER — Encounter

## 2024-06-27 VITALS — Ht 73.7 in | Wt 240.5 lb

## 2024-06-27 DIAGNOSIS — Z955 Presence of coronary angioplasty implant and graft: Secondary | ICD-10-CM | POA: Diagnosis not present

## 2024-06-27 NOTE — Patient Instructions (Signed)
 Patient Instructions  Patient Details  Name: [name] MRN: [id number] Date of Birth: [birth year] Referring Provider:  Florencio Cara BIRCH, MD  Below are your personal goals for exercise, nutrition, and risk factors. Our goal is to help you stay on track towards obtaining and maintaining these goals. We will be discussing your progress on these goals with you throughout the program.  Initial Exercise Prescription:  Initial Exercise Prescription - 06/27/24 1000       Date of Initial Exercise RX and Referring Provider   Date 06/27/24    Referring Provider Florencio Cara, MD      Oxygen   Maintain Oxygen Saturation 88% or higher      Treadmill   MPH 2.5    Grade 0    Minutes 15    METs 2.91      REL-XR   Level 2    Speed 50    Minutes 15    METs 3.27      T5 Nustep   Level 3    SPM 80    Minutes 15    METs 3.27      Biostep-RELP   Level 3    SPM 50    Minutes 15    METs 3.27      Prescription Details   Frequency (times per week) 2    Duration Progress to 30 minutes of continuous aerobic without signs/symptoms of physical distress      Intensity   THRR 40-80% of Max Heartrate 97-132    Ratings of Perceived Exertion 11-13    Perceived Dyspnea 0-4      Progression   Progression Continue to progress workloads to maintain intensity without signs/symptoms of physical distress.      Resistance Training   Training Prescription Yes    Weight 10 lb    Reps 10-15          Exercise Goals: Frequency: Be able to perform aerobic exercise two to three times per week in program working toward 2-5 days per week of home exercise.  Intensity: Work with a perceived exertion of 11 (fairly light) - 15 (hard) while following your exercise prescription.  We will make changes to your prescription with you as you progress through the program.   Duration: Be able to do 30 to 45 minutes of continuous aerobic exercise in addition to a 5 minute warm-up and a 5 minute  cool-down routine.   Nutrition Goals: Your personal nutrition goals will be established when you do your nutrition analysis with the dietician.  The following are general nutrition guidelines to follow: Cholesterol < 200mg /day Sodium < 1500mg /day Fiber: Women over 50 yrs - 21 grams per day  Personal Goals:  Personal Goals and Risk Factors at Admission - 06/27/24 1019       Core Components/Risk Factors/Patient Goals on Admission    Weight Management Weight Loss;Yes    Intervention Weight Management: Develop a combined nutrition and exercise program designed to reach desired caloric intake, while maintaining appropriate intake of nutrient and fiber, sodium and fats, and appropriate energy expenditure required for the weight goal.;Weight Management: Provide education and appropriate resources to help participant work on and attain dietary goals.;Weight Management/Obesity: Establish reasonable short term and long term weight goals.    Admit Weight 240 lb 8 oz (109.1 kg)    Goal Weight: Short Term 230 lb (104.3 kg)    Goal Weight: Long Term 220 lb (99.8 kg)    Expected Outcomes Short Term: Continue  to assess and modify interventions until short term weight is achieved;Long Term: Adherence to nutrition and physical activity/exercise program aimed toward attainment of established weight goal;Weight Loss: Understanding of general recommendations for a balanced deficit meal plan, which promotes 1-2 lb weight loss per week and includes a negative energy balance of [phone] kcal/d;Understanding recommendations for meals to include 15-35% energy as protein, 25-35% energy from fat, 35-60% energy from carbohydrates, less than 200mg  of dietary cholesterol, 20-35 gm of total fiber daily;Understanding of distribution of calorie intake throughout the day with the consumption of 4-5 meals/snacks    Hypertension Yes    Intervention Provide education on lifestyle modifcations including regular physical  activity/exercise, weight management, moderate sodium restriction and increased consumption of fresh fruit, vegetables, and low fat dairy, alcohol moderation, and smoking cessation.;Monitor prescription use compliance.    Expected Outcomes Short Term: Continued assessment and intervention until BP is < 140/47mm HG in hypertensive participants. < 130/81mm HG in hypertensive participants with diabetes, heart failure or chronic kidney disease.;Long Term: Maintenance of blood pressure at goal levels.    Lipids Yes    Intervention Provide education and support for participant on nutrition & aerobic/resistive exercise along with prescribed medications to achieve LDL 70mg , HDL >40mg .    Expected Outcomes Short Term: Participant states understanding of desired cholesterol values and is compliant with medications prescribed. Participant is following exercise prescription and nutrition guidelines.;Long Term: Cholesterol controlled with medications as prescribed, with individualized exercise RX and with personalized nutrition plan. Value goals: LDL < 70mg , HDL > 40 mg.          Tobacco Use Initial Evaluation: Social History   Tobacco Use  Smoking Status Never  Smokeless Tobacco Former   Types: Chew   Quit date: 03/2020    Exercise Goals and Review:  Exercise Goals     Row Name 06/27/24 1015             Exercise Goals   Increase Physical Activity Yes       Intervention Provide advice, education, support and counseling about physical activity/exercise needs.;Develop an individualized exercise prescription for aerobic and resistive training based on initial evaluation findings, risk stratification, comorbidities and participant's personal goals.       Expected Outcomes Short Term: Attend rehab on a regular basis to increase amount of physical activity.;Long Term: Add in home exercise to make exercise part of routine and to increase amount of physical activity.;Long Term: Exercising regularly at  least 3-5 days a week.       Increase Strength and Stamina Yes       Intervention Provide advice, education, support and counseling about physical activity/exercise needs.;Develop an individualized exercise prescription for aerobic and resistive training based on initial evaluation findings, risk stratification, comorbidities and participant's personal goals.       Expected Outcomes Short Term: Increase workloads from initial exercise prescription for resistance, speed, and METs.;Short Term: Perform resistance training exercises routinely during rehab and add in resistance training at home;Long Term: Improve cardiorespiratory fitness, muscular endurance and strength as measured by increased METs and functional capacity ( )       Able to understand and use rate of perceived exertion (RPE) scale Yes       Intervention Provide education and explanation on how to use RPE scale       Expected Outcomes Short Term: Able to use RPE daily in rehab to express subjective intensity level;Long Term:  Able to use RPE to guide intensity level when exercising independently  Able to understand and use Dyspnea scale Yes       Intervention Provide education and explanation on how to use Dyspnea scale       Expected Outcomes Short Term: Able to use Dyspnea scale daily in rehab to express subjective sense of shortness of breath during exertion;Long Term: Able to use Dyspnea scale to guide intensity level when exercising independently       Knowledge and understanding of Target Heart Rate Range (THRR) Yes       Intervention Provide education and explanation of THRR including how the numbers were predicted and where they are located for reference       Expected Outcomes Short Term: Able to state/look up THRR;Short Term: Able to use daily as guideline for intensity in rehab;Long Term: Able to use THRR to govern intensity when exercising independently       Able to check pulse independently Yes       Intervention  Provide education and demonstration on how to check pulse in carotid and radial arteries.;Review the importance of being able to check your own pulse for safety during independent exercise       Expected Outcomes Short Term: Able to explain why pulse checking is important during independent exercise;Long Term: Able to check pulse independently and accurately       Understanding of Exercise Prescription Yes       Intervention Provide education, explanation, and written materials on patient's individual exercise prescription       Expected Outcomes Short Term: Able to explain program exercise prescription;Long Term: Able to explain home exercise prescription to exercise independently

## 2024-06-27 NOTE — Progress Notes (Signed)
 Cardiac Individual Treatment Plan  Patient Details  Name: [name] MRN: [id number] Date of Birth: Apr 19, 1956 Referring Provider:   Flowsheet Row Cardiac Rehab from 06/27/2024 in [facility] Cardiac and Pulmonary Rehab  Referring Provider Florencio Kava, MD    Initial Encounter Date:  Flowsheet Row Cardiac Rehab from 06/27/2024 in [facility] Cardiac and Pulmonary Rehab  Date 06/27/24    Visit Diagnosis: Status post coronary artery stent placement  Patient's Home Medications on Admission:  Current Outpatient Medications:    acetaminophen  (TYLENOL ) 325 MG tablet, Take 650 mg by mouth every 6 (six) hours as needed for moderate pain (pain score 4-6)., Disp: , Rfl:    apixaban  (ELIQUIS ) 5 MG TABS tablet, Take 1 tablet (5 mg total) by mouth 2 (two) times daily. Restart Wednesday, November 16, Disp: 60 tablet, Rfl: 0   celecoxib  (CELEBREX ) 200 MG capsule, Take 200 mg by mouth daily. (Patient taking differently: Take 200 mg by mouth daily. TAKING FOUR DAYS A WEEK), Disp: , Rfl:    clopidogrel (PLAVIX) 75 MG tablet, Take 75 mg by mouth daily., Disp: , Rfl:    cyclobenzaprine  (FLEXERIL ) 5 MG tablet, Take 5 mg by mouth 3 (three) times daily as needed. (Patient not taking: Reported on 06/26/2024), Disp: , Rfl:    diltiazem  (CARDIZEM  CD) 180 MG 24 hr capsule, Take 1 capsule (180 mg total) by mouth 2 (two) times daily. (Patient taking differently: Take 180 mg by mouth daily.), Disp: 180 capsule, Rfl: 1   ezetimibe (ZETIA) 10 MG tablet, Take 1 tablet by mouth daily., Disp: , Rfl:    ferrous sulfate  325 (65 FE) MG tablet, Take 325 mg by mouth daily with breakfast., Disp: , Rfl:    fexofenadine  (ALLEGRA ) 180 MG tablet, Take 1 tablet (180 mg total) by mouth daily., Disp: 90 tablet, Rfl: 1   fluticasone  (FLONASE ) 50 MCG/ACT nasal spray, Place 2 sprays into both nostrils daily. (Patient taking differently: Place 2 sprays into both nostrils as needed for allergies.), Disp: 18 mL, Rfl: 5   Fluticasone -Umeclidin-Vilant  (TRELEGY ELLIPTA) 100-62.5-25 MCG/ACT AEPB, Inhale into the lungs. (Patient not taking: Reported on 06/26/2024), Disp: , Rfl:    methocarbamol  (ROBAXIN ) 500 MG tablet, Take by mouth., Disp: , Rfl:    metoprolol  succinate (TOPROL -XL) 50 MG 24 hr tablet, Take 1 tablet (50 mg total) by mouth daily. cardio (Patient taking differently: Take 25 mg by mouth 2 (two) times daily. cardio), Disp: 90 tablet, Rfl: 1   montelukast  (SINGULAIR ) 10 MG tablet, Take 1 tablet (10 mg total) by mouth at bedtime., Disp: 90 tablet, Rfl: 1   Multiple Vitamins-Minerals (MULTIVITAMIN MEN) TABS, Take 1 tablet by mouth daily. (Patient not taking: Reported on 06/26/2024), Disp: , Rfl:    pantoprazole  (PROTONIX ) 40 MG tablet, Take 1 tablet (40 mg total) by mouth every evening., Disp: 90 tablet, Rfl: 1   potassium chloride  SA (KLOR-CON  M20) 20 MEQ tablet, Take 1 tablet (20 mEq total) by mouth 3 (three) times daily., Disp: 270 tablet, Rfl: 1   rosuvastatin  (CRESTOR ) 5 MG tablet, TAKE 1 TABLET BY MOUTH THREE TIMES A WEEK (Patient taking differently: Take 5 mg by mouth daily. TAKE 1 TABLET BY MOUTH THREE TIMES A WEEK), Disp: 90 tablet, Rfl: 1   tadalafil  (CIALIS ) 10 MG tablet, Take 1 tablet (10 mg total) by mouth every other day. Okay to take an additional 10 mg boost dose as needed 45 minutes prior to sexual activity, Disp: 90 tablet, Rfl: 3   tamsulosin  (FLOMAX ) 0.4 MG CAPS  capsule, Take 1 capsule (0.4 mg total) by mouth daily., Disp: 90 capsule, Rfl: 3   traMADol  (ULTRAM ) 50 MG tablet, Take 50 mg by mouth every 6 (six) hours as needed., Disp: , Rfl:    valsartan  (DIOVAN ) 320 MG tablet, Take 1 tablet (320 mg total) by mouth daily. (Patient taking differently: Take 160 mg by mouth daily.), Disp: 90 tablet, Rfl: 1  Past Medical History: Past Medical History:  Diagnosis Date   Acquired deafness, right    per pt completed deafness right ear since age 74   Allergic rhinitis, seasonal    Anticoagulant long-term use    eliquis --- managed by  cardiology   BPH associated with nocturia    urologist--- dr alvaro   Cancer [facility])    basal and squamous cell   Chronic low back pain    DDD (degenerative disc disease), lumbosacral    Foraminal stenosis of lumbosacral region    GERD (gastroesophageal reflux disease)    Heart murmur    History of kidney stones    History of skin cancer    Hydronephrosis, left    Hyperlipidemia, mixed    Hypertension, essential    followed by pcp and cardiology   Hypokalemia    OA (osteoarthritis)    Paroxysmal atrial fibrillation (HCC) 01/28/2017   cardiologist--- dr hester;   pt had ETT 03-16-2017 per cardio note normal, event monitor 02-09-2017 showed baseline SR some peroids  Afib with controlled rate with occ PAC/ PVC;  per pt had echo many yrs ago prior to AFib    Tobacco Use: Social History   Tobacco Use  Smoking Status Never  Smokeless Tobacco Former   Types: Chew   Quit date: 03/2020    Labs: Review Flowsheet  More data exists      Latest Ref Rng & Units 06/15/2022 08/24/2022 03/01/2023 09/15/2023 02/13/2024  Labs for ITP Cardiac and Pulmonary Rehab  Cholestrol 100 - 199 mg/dL 871  - 865  785  843   LDL (calc) 0 - 99 mg/dL 67  - 78  853  97   HDL-C >39 mg/dL 46  - 40  43  43   Trlycerides 0 - 149 mg/dL 76  - 84  859  83   Hemoglobin A1c 4.8 - 5.6 % - 6.1  6.4  - -     Exercise Target Goals: Exercise Program Goal: Individual exercise prescription set using results from initial 6 min walk test and THRR while considering  patient's activity barriers and safety.   Exercise Prescription Goal: Initial exercise prescription builds to 30-45 minutes a day of aerobic activity, 2-3 days per week.  Home exercise guidelines will be given to patient during program as part of exercise prescription that the participant will acknowledge.   Education: Aerobic Exercise: - Group verbal and visual presentation on the components of exercise prescription. Introduces F.I.T.T principle from ACSM for  exercise prescriptions.  Reviews F.I.T.T. principles of aerobic exercise including progression. Written material provided at class time.   Education: Resistance Exercise: - Group verbal and visual presentation on the components of exercise prescription. Introduces F.I.T.T principle from ACSM for exercise prescriptions  Reviews F.I.T.T. principles of resistance exercise including progression. Written material provided at class time.    Education: Exercise & Equipment Safety: - Individual verbal instruction and demonstration of equipment use and safety with use of the equipment. Flowsheet Row Cardiac Rehab from 06/27/2024 in [facility] Cardiac and Pulmonary Rehab  Date 06/27/24  Educator MB  Instruction Review  Code 1- Verbalizes Understanding    Education: Exercise Physiology & General Exercise Guidelines: - Group verbal and written instruction with models to review the exercise physiology of the cardiovascular system and associated critical values. Provides general exercise guidelines with specific guidelines to those with heart or lung disease. Written material provided at class time. Flowsheet Row Cardiac Rehab from 06/27/2024 in [facility] Cardiac and Pulmonary Rehab  Education need identified 06/27/24    Education: Flexibility, Balance, Mind/Body Relaxation: - Group verbal and visual presentation with interactive activity on the components of exercise prescription. Introduces F.I.T.T principle from ACSM for exercise prescriptions. Reviews F.I.T.T. principles of flexibility and balance exercise training including progression. Also discusses the mind body connection.  Reviews various relaxation techniques to help reduce and manage stress (i.e. Deep breathing, progressive muscle relaxation, and visualization). Balance handout provided to take home. Written material provided at class time.   Activity Barriers & Risk Stratification:  Activity Barriers & Cardiac Risk Stratification - 06/27/24 1007        Activity Barriers & Cardiac Risk Stratification   Activity Barriers Back Problems;Other (comment)    Comments R leg    Cardiac Risk Stratification Moderate          6 Minute Walk:  6 Minute Walk     Row Name 06/27/24 1006         6 Minute Walk   Phase Initial     Distance 1300 feet     Walk Time 6 minutes     # of Rest Breaks 0     MPH 2.46     METS 3.27     RPE 11     Perceived Dyspnea  0     VO2 Peak 11.44     Symptoms No     Resting HR 62 bpm     Resting BP 154/82     Resting Oxygen Saturation  97 %     Exercise Oxygen Saturation  during 6 min walk 95 %     Max Ex. HR 112 bpm     Max Ex. BP 180/92     2 Minute Post BP 168/90        Oxygen Initial Assessment:   Oxygen Re-Evaluation:   Oxygen Discharge (Final Oxygen Re-Evaluation):   Initial Exercise Prescription:  Initial Exercise Prescription - 06/27/24 1000       Date of Initial Exercise RX and Referring Provider   Date 06/27/24    Referring Provider Florencio Kava, MD      Oxygen   Maintain Oxygen Saturation 88% or higher      Treadmill   MPH 2.5    Grade 0    Minutes 15    METs 2.91      REL-XR   Level 2    Speed 50    Minutes 15    METs 3.27      T5 Nustep   Level 3    SPM 80    Minutes 15    METs 3.27      Biostep-RELP   Level 3    SPM 50    Minutes 15    METs 3.27      Prescription Details   Frequency (times per week) 2    Duration Progress to 30 minutes of continuous aerobic without signs/symptoms of physical distress      Intensity   THRR 40-80% of Max Heartrate 97-132    Ratings of Perceived Exertion 11-13    Perceived Dyspnea 0-4  Progression   Progression Continue to progress workloads to maintain intensity without signs/symptoms of physical distress.      Resistance Training   Training Prescription Yes    Weight 10 lb    Reps 10-15          Perform Capillary Blood Glucose checks as needed.  Exercise Prescription Changes:   Exercise  Prescription Changes     Row Name 06/27/24 1000             Response to Exercise   Blood Pressure (Admit) 154/82       Blood Pressure (Exercise) 180/92       Blood Pressure (Exit) 150/90       Heart Rate (Admit) 62 bpm       Heart Rate (Exercise) 112 bpm       Heart Rate (Exit) 61 bpm       Oxygen Saturation (Admit) 97 %       Oxygen Saturation (Exercise) 95 %       Oxygen Saturation (Exit) 96 %       Rating of Perceived Exertion (Exercise) 11       Perceived Dyspnea (Exercise) 0       Symptoms none       Comments results         Progression   Average METs 3.27          Exercise Comments:   Exercise Goals and Review:   Exercise Goals     Row Name 06/27/24 1015             Exercise Goals   Increase Physical Activity Yes       Intervention Provide advice, education, support and counseling about physical activity/exercise needs.;Develop an individualized exercise prescription for aerobic and resistive training based on initial evaluation findings, risk stratification, comorbidities and participant's personal goals.       Expected Outcomes Short Term: Attend rehab on a regular basis to increase amount of physical activity.;Long Term: Add in home exercise to make exercise part of routine and to increase amount of physical activity.;Long Term: Exercising regularly at least 3-5 days a week.       Increase Strength and Stamina Yes       Intervention Provide advice, education, support and counseling about physical activity/exercise needs.;Develop an individualized exercise prescription for aerobic and resistive training based on initial evaluation findings, risk stratification, comorbidities and participant's personal goals.       Expected Outcomes Short Term: Increase workloads from initial exercise prescription for resistance, speed, and METs.;Short Term: Perform resistance training exercises routinely during rehab and add in resistance training at home;Long Term: Improve  cardiorespiratory fitness, muscular endurance and strength as measured by increased METs and functional capacity ( )       Able to understand and use rate of perceived exertion (RPE) scale Yes       Intervention Provide education and explanation on how to use RPE scale       Expected Outcomes Short Term: Able to use RPE daily in rehab to express subjective intensity level;Long Term:  Able to use RPE to guide intensity level when exercising independently       Able to understand and use Dyspnea scale Yes       Intervention Provide education and explanation on how to use Dyspnea scale       Expected Outcomes Short Term: Able to use Dyspnea scale daily in rehab to express subjective sense of shortness of breath during  exertion;Long Term: Able to use Dyspnea scale to guide intensity level when exercising independently       Knowledge and understanding of Target Heart Rate Range (THRR) Yes       Intervention Provide education and explanation of THRR including how the numbers were predicted and where they are located for reference       Expected Outcomes Short Term: Able to state/look up THRR;Short Term: Able to use daily as guideline for intensity in rehab;Long Term: Able to use THRR to govern intensity when exercising independently       Able to check pulse independently Yes       Intervention Provide education and demonstration on how to check pulse in carotid and radial arteries.;Review the importance of being able to check your own pulse for safety during independent exercise       Expected Outcomes Short Term: Able to explain why pulse checking is important during independent exercise;Long Term: Able to check pulse independently and accurately       Understanding of Exercise Prescription Yes       Intervention Provide education, explanation, and written materials on patient's individual exercise prescription       Expected Outcomes Short Term: Able to explain program exercise prescription;Long  Term: Able to explain home exercise prescription to exercise independently          Exercise Goals Re-Evaluation :   Discharge Exercise Prescription (Final Exercise Prescription Changes):  Exercise Prescription Changes - 06/27/24 1000       Response to Exercise   Blood Pressure (Admit) 154/82    Blood Pressure (Exercise) 180/92    Blood Pressure (Exit) 150/90    Heart Rate (Admit) 62 bpm    Heart Rate (Exercise) 112 bpm    Heart Rate (Exit) 61 bpm    Oxygen Saturation (Admit) 97 %    Oxygen Saturation (Exercise) 95 %    Oxygen Saturation (Exit) 96 %    Rating of Perceived Exertion (Exercise) 11    Perceived Dyspnea (Exercise) 0    Symptoms none    Comments results      Progression   Average METs 3.27          Nutrition:  Target Goals: Understanding of nutrition guidelines, daily intake of sodium 1500mg , cholesterol 200mg , calories 30% from fat and 7% or less from saturated fats, daily to have 5 or more servings of fruits and vegetables.  Education: Nutrition 1 -Group instruction provided by verbal, written material, interactive activities, discussions, models, and posters to present general guidelines for heart healthy nutrition including macronutrients, label reading, and promoting whole foods over processed counterparts. Education serves as Pensions consultant of discussion of heart healthy eating for all. Written material provided at class time.    Education: Nutrition 2 -Group instruction provided by verbal, written material, interactive activities, discussions, models, and posters to present general guidelines for heart healthy nutrition including sodium, cholesterol, and saturated fat. Providing guidance of habit forming to improve blood pressure, cholesterol, and body weight. Written material provided at class time.     Biometrics:  Pre Biometrics - 06/27/24 1015       Pre Biometrics   Height 6' 1.7 (1.872 m)    Weight 240 lb 8 oz (109.1 kg)    Waist  Circumference 46.3 inches    Hip Circumference 43 inches    Waist to Hip Ratio 1.08 %    BMI (Calculated) 31.13    Single Leg Stand 10 seconds  Nutrition Therapy Plan and Nutrition Goals:  Nutrition Therapy & Goals - 06/27/24 1017       Nutrition Therapy   RD appointment deferred Yes      Personal Nutrition Goals   Nutrition Goal RD appointment deferred at this time      Intervention Plan   Intervention Prescribe, educate and counsel regarding individualized specific dietary modifications aiming towards targeted core components such as weight, hypertension, lipid management, diabetes, heart failure and other comorbidities.    Expected Outcomes Short Term Goal: Understand basic principles of dietary content, such as calories, fat, sodium, cholesterol and nutrients.          Nutrition Assessments:  MEDIFICTS Score Key: >=70 Need to make dietary changes  40-70 Heart Healthy Diet <= 40 Therapeutic Level Cholesterol Diet  Flowsheet Row Cardiac Rehab from 06/27/2024 in [facility] Cardiac and Pulmonary Rehab  Picture Your Plate Total Score on Admission 60   Picture Your Plate Scores: <59 Unhealthy dietary pattern with much room for improvement. 41-50 Dietary pattern unlikely to meet recommendations for good health and room for improvement. 51-60 More healthful dietary pattern, with some room for improvement.  >60 Healthy dietary pattern, although there may be some specific behaviors that could be improved.    Nutrition Goals Re-Evaluation:   Nutrition Goals Discharge (Final Nutrition Goals Re-Evaluation):   Psychosocial: Target Goals: Acknowledge presence or absence of significant depression and/or stress, maximize coping skills, provide positive support system. Participant is able to verbalize types and ability to use techniques and skills needed for reducing stress and depression.   Education: Stress, Anxiety, and Depression - Group verbal and visual presentation  to define topics covered.  Reviews how body is impacted by stress, anxiety, and depression.  Also discusses healthy ways to reduce stress and to treat/manage anxiety and depression. Written material provided at class time.   Education: Sleep Hygiene -Provides group verbal and written instruction about how sleep can affect your health.  Define sleep hygiene, discuss sleep cycles and impact of sleep habits. Review good sleep hygiene tips.   Initial Review & Psychosocial Screening:  Initial Psych Review & Screening - 06/26/24 0957       Initial Review   Current issues with None Identified      Family Dynamics   Good Support System? 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 Tim stated that he does not have any stressors and has a good support system of his wife and family. Patient stated he particularly enjoys his time with his 10 grandchildren.Tim was pleasant to speak with and stated that he enjoys yard work and remains active. The patient stated he does have back pain/issues, but other than that manages well with his physical and mental health.      Barriers   Psychosocial barriers to participate in program The patient should benefit from training in stress management and relaxation.      Screening Interventions   Interventions To provide support and resources with identified psychosocial needs;Provide feedback about the scores to participant;Encouraged to exercise    Expected Outcomes Short Term goal: Identification and review with participant of any Quality of Life or Depression concerns found by scoring the questionnaire.;Long Term goal: The participant improves quality of Life and PHQ9 Scores as seen by post scores and/or verbalization of changes          Quality of Life Scores:   Quality of Life -  06/27/24 1017       Quality of Life   Select Quality of Life      Quality of Life Scores    Health/Function Pre 12.73 %    Socioeconomic Pre 12.69 %    Psych/Spiritual Pre 12.79 %    Family Pre 13.1 %    GLOBAL Pre 12.79 %         Scores of 19 and below usually indicate a poorer quality of life in these areas.  A difference of  2-3 points is a clinically meaningful difference.  A difference of 2-3 points in the total score of the Quality of Life Index has been associated with significant improvement in overall quality of life, self-image, physical symptoms, and general health in studies assessing change in quality of life.  PHQ-9: Review Flowsheet  More data exists      06/27/2024 02/12/2024 10/05/2023 09/15/2023 09/01/2023  Depression screen PHQ 2/9  Decreased Interest 0 0 0 0 0  Down, Depressed, Hopeless 0 0 0 0 0  PHQ - 2 Score 0 0 0 0 0  Altered sleeping 0 0 0 0 0  Tired, decreased energy 0 1 0 0 0  Change in appetite 0 0 0 0 0  Feeling bad or failure about yourself  0 0 0 0 0  Trouble concentrating 0 0 0 0 0  Moving slowly or fidgety/restless 0 0 0 0 0  Suicidal thoughts 0 0 0 0 0  PHQ-9 Score 0 1 0 0 0  Difficult doing work/chores Not difficult at all Not difficult at all Not difficult at all Not difficult at all Not difficult at all   Interpretation of Total Score  Total Score Depression Severity:  1-4 = Minimal depression, 5-9 = Mild depression, 10-14 = Moderate depression, 15-19 = Moderately severe depression, 20-27 = Severe depression   Psychosocial Evaluation and Intervention:  Psychosocial Evaluation - 06/26/24 0958       Psychosocial Evaluation & Interventions   Interventions Stress management education;Relaxation education;Encouraged to exercise with the program and follow exercise prescription    Comments 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 Tim stated that he does not have any stressors and has a good support system of his wife and family. Patient stated he particularly enjoys his time with  his 10 grandchildren.Tim was pleasant to speak with and stated that he enjoys yard work and remains active. The patient stated he does have back pain/issues, but other than that manages well with his physical and mental health.    Expected Outcomes ST: Attend cardiac rehab for education and exercise; LT: Develop and maintain positive self-care habits    Continue Psychosocial Services  Follow up required by staff          Psychosocial Re-Evaluation:   Psychosocial Discharge (Final Psychosocial Re-Evaluation):   Vocational Rehabilitation: Provide vocational rehab assistance to qualifying candidates.   Vocational Rehab Evaluation & Intervention:  Vocational Rehab - 06/26/24 0957       Initial Vocational Rehab Evaluation & Intervention   Assessment shows need for Vocational Rehabilitation No          Education: Education Goals: Education classes will be provided on a variety of topics geared toward better understanding of heart health and risk factor modification. Participant will state understanding/return demonstration of topics presented as noted by education test scores.  Learning Barriers/Preferences:  Learning Barriers/Preferences - 06/26/24 0957  Learning Barriers/Preferences   Learning Barriers None    Learning Preferences Group Instruction;Individual Instruction;Verbal Instruction;Video;Written Material          General Cardiac Education Topics:  AED/CPR: - Group verbal and written instruction with the use of models to demonstrate the basic use of the AED with the basic ABC's of resuscitation.   Test and Procedures: - Group verbal and visual presentation and models provide information about basic cardiac anatomy and function. Reviews the testing methods done to diagnose heart disease and the outcomes of the test results. Describes the treatment choices: Medical Management, Angioplasty, or Coronary Bypass Surgery for treating various heart conditions  including Myocardial Infarction, Angina, Valve Disease, and Cardiac Arrhythmias. Written material provided at class time.   Medication Safety: - Group verbal and visual instruction to review commonly prescribed medications for heart and lung disease. Reviews the medication, class of the drug, and side effects. Includes the steps to properly store meds and maintain the prescription regimen. Written material provided at class time.   Intimacy: - Group verbal instruction through game format to discuss how heart and lung disease can affect sexual intimacy. Written material provided at class time.   Know Your Numbers and Heart Failure: - Group verbal and visual instruction to discuss disease risk factors for cardiac and pulmonary disease and treatment options.  Reviews associated critical values for Overweight/Obesity, Hypertension, Cholesterol, and Diabetes.  Discusses basics of heart failure: signs/symptoms and treatments.  Introduces Heart Failure Zone chart for action plan for heart failure. Written material provided at class time.   Infection Prevention: - Provides verbal and written material to individual with discussion of infection control including proper hand washing and proper equipment cleaning during exercise session. Flowsheet Row Cardiac Rehab from 06/27/2024 in [facility] Cardiac and Pulmonary Rehab  Date 06/27/24  Educator MB  Instruction Review Code 1- Verbalizes Understanding    Falls Prevention: - Provides verbal and written material to individual with discussion of falls prevention and safety. Flowsheet Row Cardiac Rehab from 06/27/2024 in [facility] Cardiac and Pulmonary Rehab  Date 06/27/24  Educator MB  Instruction Review Code 1- Verbalizes Understanding    Other: -Provides group and verbal instruction on various topics (see comments) Flowsheet Row Cardiac Rehab from 06/27/2024 in [facility] Cardiac and Pulmonary Rehab  Date 06/27/24  [facility] and Cardiac Procedures]    Knowledge  Questionnaire Score:  Knowledge Questionnaire Score - 06/27/24 1018       Knowledge Questionnaire Score   Pre Score 23/26          Core Components/Risk Factors/Patient Goals at Admission:  Personal Goals and Risk Factors at Admission - 06/27/24 1019       Core Components/Risk Factors/Patient Goals on Admission    Weight Management Weight Loss;Yes    Intervention Weight Management: Develop a combined nutrition and exercise program designed to reach desired caloric intake, while maintaining appropriate intake of nutrient and fiber, sodium and fats, and appropriate energy expenditure required for the weight goal.;Weight Management: Provide education and appropriate resources to help participant work on and attain dietary goals.;Weight Management/Obesity: Establish reasonable short term and long term weight goals.    Admit Weight 240 lb 8 oz (109.1 kg)    Goal Weight: Short Term 230 lb (104.3 kg)    Goal Weight: Long Term 220 lb (99.8 kg)    Expected Outcomes Short Term: Continue to assess and modify interventions until short term weight is achieved;Long Term: Adherence to nutrition and physical activity/exercise program aimed toward attainment of established  weight goal;Weight Loss: Understanding of general recommendations for a balanced deficit meal plan, which promotes 1-2 lb weight loss per week and includes a negative energy balance of [phone] kcal/d;Understanding recommendations for meals to include 15-35% energy as protein, 25-35% energy from fat, 35-60% energy from carbohydrates, less than 200mg  of dietary cholesterol, 20-35 gm of total fiber daily;Understanding of distribution of calorie intake throughout the day with the consumption of 4-5 meals/snacks    Hypertension Yes    Intervention Provide education on lifestyle modifcations including regular physical activity/exercise, weight management, moderate sodium restriction and increased consumption of fresh fruit, vegetables, and low fat  dairy, alcohol moderation, and smoking cessation.;Monitor prescription use compliance.    Expected Outcomes Short Term: Continued assessment and intervention until BP is < 140/71mm HG in hypertensive participants. < 130/35mm HG in hypertensive participants with diabetes, heart failure or chronic kidney disease.;Long Term: Maintenance of blood pressure at goal levels.    Lipids Yes    Intervention Provide education and support for participant on nutrition & aerobic/resistive exercise along with prescribed medications to achieve LDL 70mg , HDL >40mg .    Expected Outcomes Short Term: Participant states understanding of desired cholesterol values and is compliant with medications prescribed. Participant is following exercise prescription and nutrition guidelines.;Long Term: Cholesterol controlled with medications as prescribed, with individualized exercise RX and with personalized nutrition plan. Value goals: LDL < 70mg , HDL > 40 mg.          Education:Diabetes - Individual verbal and written instruction to review signs/symptoms of diabetes, desired ranges of glucose level fasting, after meals and with exercise. Acknowledge that pre and post exercise glucose checks will be done for 3 sessions at entry of program.   Core Components/Risk Factors/Patient Goals Review:    Core Components/Risk Factors/Patient Goals at Discharge (Final Review):    ITP Comments:  ITP Comments     Row Name 06/26/24 1001 06/27/24 1006         ITP Comments Initial phone call completed. Diagnosis can be found in [facility] 06/12/2024. EP Orientation scheduled for Thursday, June 27, 2024, @ 0800. Completed and gym orientation for cardiac rehab. Initial ITP created and sent for review to Dr. Oneil Pinal, Medical Director.         Comments: Initial ITP

## 2024-06-28 DIAGNOSIS — H0288A Meibomian gland dysfunction right eye, upper and lower eyelids: Secondary | ICD-10-CM | POA: Diagnosis not present

## 2024-06-28 DIAGNOSIS — H2513 Age-related nuclear cataract, bilateral: Secondary | ICD-10-CM | POA: Diagnosis not present

## 2024-06-28 DIAGNOSIS — H0288B Meibomian gland dysfunction left eye, upper and lower eyelids: Secondary | ICD-10-CM | POA: Diagnosis not present

## 2024-07-02 DIAGNOSIS — M5116 Intervertebral disc disorders with radiculopathy, lumbar region: Secondary | ICD-10-CM | POA: Diagnosis not present

## 2024-07-02 DIAGNOSIS — I10 Essential (primary) hypertension: Secondary | ICD-10-CM | POA: Diagnosis not present

## 2024-07-04 ENCOUNTER — Encounter: Admitting: Emergency Medicine

## 2024-07-04 DIAGNOSIS — Z955 Presence of coronary angioplasty implant and graft: Secondary | ICD-10-CM

## 2024-07-04 NOTE — Progress Notes (Signed)
 Daily Session Note  Patient Details  Name: [name] MRN: [id number] Date of Birth: [birth year] Referring Provider:   Flowsheet Row Cardiac Rehab from 06/27/2024 in [facility] Cardiac and Pulmonary Rehab  Referring Provider Florencio Kava, MD    Encounter Date: 07/04/2024  Check In:  Session Check In - 07/04/24 0909       Check-In   Supervising physician immediately available to respond to emergencies See telemetry face sheet for immediately available ER MD    Location [facility]-Cardiac & Pulmonary Rehab    Staff Present Devaughn Jaeger, BS, Exercise Physiologist;Maxon Conetta BS, Exercise Physiologist;Joseph Rolinda RCP,RRT,BSRT;Akosua Constantine RN,BSN    Virtual Visit No    Medication changes reported     No    Fall or balance concerns reported    No    Warm-up and Cool-down Performed on first and last piece of equipment    Resistance Training Performed Yes    VAD Patient? No    PAD/SET Patient? No      Pain Assessment   Currently in Pain? No/denies             Social History   Tobacco Use  Smoking Status Never  Smokeless Tobacco Former   Types: Chew   Quit date: 03/2020    Goals Met:  Independence with exercise equipment Exercise tolerated well No report of concerns or symptoms today Strength training completed today  Goals Unmet:  Not Applicable  Comments: First full day of exercise!  Patient was oriented to gym and equipment including functions, settings, policies, and procedures.  Patient's individual exercise prescription and treatment plan were reviewed.  All starting workloads were established based on the results of the 6 minute walk test done at initial orientation visit.  The plan for exercise progression was also introduced and progression will be customized based on patient's performance and goals.    Dr. Oneil Pinal is Medical Director for [facility] Cardiac Rehabilitation.  Dr. Fuad Aleskerov is Medical Director for [facility] Pulmonary Rehabilitation.

## 2024-07-08 ENCOUNTER — Encounter

## 2024-07-08 DIAGNOSIS — Z955 Presence of coronary angioplasty implant and graft: Secondary | ICD-10-CM

## 2024-07-08 NOTE — Progress Notes (Signed)
 Daily Session Note  Patient Details  Name: [name] MRN: [id number] Date of Birth: [birth year] Referring Provider:   Flowsheet Row Cardiac Rehab from 06/27/2024 in [facility] Cardiac and Pulmonary Rehab  Referring Provider Florencio Kava, MD    Encounter Date: 07/08/2024  Check In:  Session Check In - 07/08/24 0912       Check-In   Supervising physician immediately available to respond to emergencies See telemetry face sheet for immediately available ER MD    Location [facility]-Cardiac & Pulmonary Rehab    Staff Present Burnard Davenport RN,BSN,MPA;Joseph Rolinda RCP,RRT,BSRT;Laura Cates RN,BSN;Dashay Giesler Dyane BS, ACSM CEP, Exercise Physiologist    Virtual Visit No    Medication changes reported     No    Fall or balance concerns reported    No    Warm-up and Cool-down Performed on first and last piece of equipment    Resistance Training Performed Yes    VAD Patient? No    PAD/SET Patient? No      Pain Assessment   Currently in Pain? No/denies             Social History   Tobacco Use  Smoking Status Never  Smokeless Tobacco Former   Types: Chew   Quit date: 03/2020    Goals Met:  Independence with exercise equipment Exercise tolerated well No report of concerns or symptoms today Strength training completed today  Goals Unmet:  Not Applicable  Comments: Pt able to follow exercise prescription today without complaint.  Will continue to monitor for progression.    Dr. Oneil Pinal is Medical Director for [facility] Cardiac Rehabilitation.  Dr. Fuad Aleskerov is Medical Director for [facility] Pulmonary Rehabilitation.

## 2024-07-09 DIAGNOSIS — I480 Paroxysmal atrial fibrillation: Secondary | ICD-10-CM | POA: Diagnosis not present

## 2024-07-10 ENCOUNTER — Ambulatory Visit

## 2024-07-11 ENCOUNTER — Encounter
Admission: RE | Disposition: A | Discharge status: Home / Self Care | Payer: Self-pay | Source: Home / Self Care | Attending: Internal Medicine

## 2024-07-11 ENCOUNTER — Ambulatory Visit
Admission: RE | Admit: 2024-07-11 | Discharge: 2024-07-11 | Disposition: A | Discharge status: Home / Self Care | Attending: Internal Medicine | Admitting: Internal Medicine

## 2024-07-11 ENCOUNTER — Other Ambulatory Visit: Payer: Self-pay

## 2024-07-11 ENCOUNTER — Encounter: Payer: Self-pay | Admitting: Anesthesiology

## 2024-07-11 ENCOUNTER — Ambulatory Visit: Payer: Self-pay | Admitting: Anesthesiology

## 2024-07-11 DIAGNOSIS — I4891 Unspecified atrial fibrillation: Secondary | ICD-10-CM | POA: Insufficient documentation

## 2024-07-11 DIAGNOSIS — E782 Mixed hyperlipidemia: Secondary | ICD-10-CM | POA: Diagnosis not present

## 2024-07-11 DIAGNOSIS — I2510 Atherosclerotic heart disease of native coronary artery without angina pectoris: Secondary | ICD-10-CM | POA: Diagnosis not present

## 2024-07-11 DIAGNOSIS — Z79899 Other long term (current) drug therapy: Secondary | ICD-10-CM | POA: Diagnosis not present

## 2024-07-11 DIAGNOSIS — M1711 Unilateral primary osteoarthritis, right knee: Secondary | ICD-10-CM | POA: Diagnosis not present

## 2024-07-11 DIAGNOSIS — Z7901 Long term (current) use of anticoagulants: Secondary | ICD-10-CM | POA: Insufficient documentation

## 2024-07-11 DIAGNOSIS — Z539 Procedure and treatment not carried out, unspecified reason: Secondary | ICD-10-CM | POA: Diagnosis not present

## 2024-07-11 DIAGNOSIS — I10 Essential (primary) hypertension: Secondary | ICD-10-CM | POA: Diagnosis not present

## 2024-07-11 DIAGNOSIS — R0989 Other specified symptoms and signs involving the circulatory and respiratory systems: Secondary | ICD-10-CM | POA: Insufficient documentation

## 2024-07-11 DIAGNOSIS — I480 Paroxysmal atrial fibrillation: Secondary | ICD-10-CM

## 2024-07-11 DIAGNOSIS — M19041 Primary osteoarthritis, right hand: Secondary | ICD-10-CM | POA: Insufficient documentation

## 2024-07-11 SURGERY — CARDIOVERSION
Anesthesia: General

## 2024-07-11 MED ORDER — SODIUM CHLORIDE 0.9 % IV SOLN: INTRAVENOUS | Status: DC

## 2024-07-11 NOTE — Progress Notes (Signed)
 PT in for cardioversion. PT states last night he felt like he went back into rhythm. EKG obtained and Dr. Florencio notified. Per Dr. Florencio ok to send pt home. PT ambulatory to lobby in NAD.

## 2024-07-12 DIAGNOSIS — M1711 Unilateral primary osteoarthritis, right knee: Secondary | ICD-10-CM | POA: Diagnosis not present

## 2024-07-17 DIAGNOSIS — Z955 Presence of coronary angioplasty implant and graft: Secondary | ICD-10-CM

## 2024-07-17 NOTE — Progress Notes (Signed)
 Cardiac Individual Treatment Plan  Patient Details  Name: ABBIE York MRN: [id number] Date of Birth: August 02, 1956 Referring Provider:   Flowsheet Row Cardiac Rehab from 06/27/2024 in [facility] Cardiac and Pulmonary Rehab  Referring Provider Justin Kava, MD    Initial Encounter Date:  Flowsheet Row Cardiac Rehab from 06/27/2024 in [facility] Cardiac and Pulmonary Rehab  Date 06/27/24    Visit Diagnosis: Status post coronary artery stent placement  Patient's Home Medications on Admission:  Current Outpatient Medications:    acetaminophen  (TYLENOL ) 325 MG tablet, Take 650 mg by mouth every 6 (six) hours as needed for moderate pain (pain score 4-6)., Disp: , Rfl:    apixaban  (ELIQUIS ) 5 MG TABS tablet, Take 1 tablet (5 mg total) by mouth 2 (two) times daily. Restart Wednesday, November 16 (Patient taking differently: Take 5 mg by mouth 2 (two) times daily.), Disp: 60 tablet, Rfl: 0   celecoxib  (CELEBREX ) 200 MG capsule, Take 200 mg by mouth daily. (Patient taking differently: Take 200 mg by mouth in the morning.), Disp: , Rfl:    clopidogrel (PLAVIX) 75 MG tablet, Take 75 mg by mouth in the morning., Disp: , Rfl:    diltiazem  (CARDIZEM  CD) 180 MG 24 hr capsule, Take 1 capsule (180 mg total) by mouth 2 (two) times daily. (Patient taking differently: Take 180 mg by mouth in the morning.), Disp: 180 capsule, Rfl: 1   ezetimibe (ZETIA) 10 MG tablet, Take 10 mg by mouth at bedtime., Disp: , Rfl:    fexofenadine  (ALLEGRA ) 180 MG tablet, Take 1 tablet (180 mg total) by mouth daily., Disp: 90 tablet, Rfl: 1   fluticasone  (FLONASE ) 50 MCG/ACT nasal spray, Place 2 sprays into both nostrils daily. (Patient taking differently: Place 2 sprays into both nostrils daily as needed for allergies or rhinitis.), Disp: 18 mL, Rfl: 5   methocarbamol  (ROBAXIN ) 500 MG tablet, Take 500 mg by mouth daily as needed for muscle spasms., Disp: , Rfl:    metoprolol  succinate (TOPROL -XL) 50 MG 24 hr tablet, Take 1 tablet (50 mg  total) by mouth daily. cardio (Patient taking differently: Take 25 mg by mouth 2 (two) times daily. cardio), Disp: 90 tablet, Rfl: 1   montelukast  (SINGULAIR ) 10 MG tablet, Take 1 tablet (10 mg total) by mouth at bedtime., Disp: 90 tablet, Rfl: 1   nitroGLYCERIN (NITROSTAT) 0.4 MG SL tablet, Place 0.4 mg under the tongue every 5 (five) minutes as needed for chest pain., Disp: , Rfl:    pantoprazole  (PROTONIX ) 40 MG tablet, Take 1 tablet (40 mg total) by mouth every evening. (Patient taking differently: Take 40 mg by mouth in the morning.), Disp: 90 tablet, Rfl: 1   potassium chloride  SA (KLOR-CON  M20) 20 MEQ tablet, Take 1 tablet (20 mEq total) by mouth 3 (three) times daily. (Patient taking differently: Take 20-40 mEq by mouth See admin instructions. Take 20 meq in the morning and 40 meq in the evening), Disp: 270 tablet, Rfl: 1   rosuvastatin  (CRESTOR ) 5 MG tablet, TAKE 1 TABLET BY MOUTH THREE TIMES A WEEK (Patient taking differently: Take 5 mg by mouth See admin instructions. TAKE 1 TABLET BY MOUTH THREE TIMES A WEEK Sun, Tues, and  thurs, in the evening), Disp: 90 tablet, Rfl: 1   tadalafil  (CIALIS ) 10 MG tablet, Take 1 tablet (10 mg total) by mouth every other day. Okay to take an additional 10 mg boost dose as needed 45 minutes prior to sexual activity (Patient taking differently: Take 10 mg by mouth daily as  needed for erectile dysfunction. Okay to take an additional 10 mg boost dose as needed 45 minutes prior to sexual activity), Disp: 90 tablet, Rfl: 3   tamsulosin  (FLOMAX ) 0.4 MG CAPS capsule, Take 1 capsule (0.4 mg total) by mouth daily., Disp: 90 capsule, Rfl: 3   traMADol  (ULTRAM ) 50 MG tablet, Take 50 mg by mouth every 6 (six) hours as needed., Disp: , Rfl:    triamterene-hydrochlorothiazide  (MAXZIDE-25) 37.5-25 MG tablet, Take 0.5 tablets by mouth in the morning., Disp: , Rfl:    valsartan  (DIOVAN ) 320 MG tablet, Take 1 tablet (320 mg total) by mouth daily. (Patient taking differently: Take  160 mg by mouth at bedtime.), Disp: 90 tablet, Rfl: 1  Past Medical History: Past Medical History:  Diagnosis Date   Acquired deafness, right    per pt completed deafness right ear since age 22   Allergic rhinitis, seasonal    Anticoagulant long-term use    eliquis --- managed by cardiology   BPH associated with nocturia    urologist--- dr alvaro   Cancer [facility])    basal and squamous cell   Chronic low back pain    DDD (degenerative disc disease), lumbosacral    Foraminal stenosis of lumbosacral region    GERD (gastroesophageal reflux disease)    Heart murmur    History of kidney stones    History of skin cancer    Hydronephrosis, left    Hyperlipidemia, mixed    Hypertension, essential    followed by pcp and cardiology   Hypokalemia    OA (osteoarthritis)    Paroxysmal atrial fibrillation (HCC) 01/28/2017   cardiologist--- dr hester;   pt had ETT 03-16-2017 per cardio note normal, event monitor 02-09-2017 showed baseline SR some peroids  Afib with controlled rate with occ PAC/ PVC;  per pt had echo many yrs ago prior to AFib    Tobacco Use: Social History   Tobacco Use  Smoking Status Never  Smokeless Tobacco Former   Types: Chew   Quit date: 03/2020    Labs: Review Flowsheet  More data exists      Latest Ref Rng & Units 06/15/2022 08/24/2022 03/01/2023 09/15/2023 02/13/2024  Labs for ITP Cardiac and Pulmonary Rehab  Cholestrol 100 - 199 mg/dL 871  - 865  785  843   LDL (calc) 0 - 99 mg/dL 67  - 78  853  97   HDL-C >39 mg/dL 46  - 40  43  43   Trlycerides 0 - 149 mg/dL 76  - 84  859  83   Hemoglobin A1c 4.8 - 5.6 % - 6.1  6.4  - -     Exercise Target Goals: Exercise Program Goal: Individual exercise prescription set using results from initial 6 min walk test and THRR while considering  patient's activity barriers and safety.   Exercise Prescription Goal: Initial exercise prescription builds to 30-45 minutes a day of aerobic activity, 2-3 days per week.  Home  exercise guidelines will be given to patient during program as part of exercise prescription that the participant will acknowledge.   Education: Aerobic Exercise: - Group verbal and visual presentation on the components of exercise prescription. Introduces F.I.T.T principle from ACSM for exercise prescriptions.  Reviews F.I.T.T. principles of aerobic exercise including progression. Written material provided at class time.   Education: Resistance Exercise: - Group verbal and visual presentation on the components of exercise prescription. Introduces F.I.T.T principle from ACSM for exercise prescriptions  Reviews F.I.T.T. principles of resistance exercise  including progression. Written material provided at class time.    Education: Exercise & Equipment Safety: - Individual verbal instruction and demonstration of equipment use and safety with use of the equipment. Flowsheet Row Cardiac Rehab from 06/27/2024 in [facility] Cardiac and Pulmonary Rehab  Date 06/27/24  Educator MB  Instruction Review Code 1- Verbalizes Understanding    Education: Exercise Physiology & General Exercise Guidelines: - Group verbal and written instruction with models to review the exercise physiology of the cardiovascular system and associated critical values. Provides general exercise guidelines with specific guidelines to those with heart or lung disease. Written material provided at class time. Flowsheet Row Cardiac Rehab from 06/27/2024 in [facility] Cardiac and Pulmonary Rehab  Education need identified 06/27/24    Education: Flexibility, Balance, Mind/Body Relaxation: - Group verbal and visual presentation with interactive activity on the components of exercise prescription. Introduces F.I.T.T principle from ACSM for exercise prescriptions. Reviews F.I.T.T. principles of flexibility and balance exercise training including progression. Also discusses the mind body connection.  Reviews various relaxation techniques to help reduce  and manage stress (i.e. Deep breathing, progressive muscle relaxation, and visualization). Balance handout provided to take home. Written material provided at class time.   Activity Barriers & Risk Stratification:  Activity Barriers & Cardiac Risk Stratification - 06/27/24 1007       Activity Barriers & Cardiac Risk Stratification   Activity Barriers Back Problems;Other (comment)    Comments R leg    Cardiac Risk Stratification Moderate          6 Minute Walk:  6 Minute Walk     Row Name 06/27/24 1006         6 Minute Walk   Phase Initial     Distance 1300 feet     Walk Time 6 minutes     # of Rest Breaks 0     MPH 2.46     METS 3.27     RPE 11     Perceived Dyspnea  0     VO2 Peak 11.44     Symptoms No     Resting HR 62 bpm     Resting BP 154/82     Resting Oxygen Saturation  97 %     Exercise Oxygen Saturation  during 6 min walk 95 %     Max Ex. HR 112 bpm     Max Ex. BP 180/92     2 Minute Post BP 168/90        Oxygen Initial Assessment:   Oxygen Re-Evaluation:   Oxygen Discharge (Final Oxygen Re-Evaluation):   Initial Exercise Prescription:  Initial Exercise Prescription - 06/27/24 1000       Date of Initial Exercise RX and Referring Provider   Date 06/27/24    Referring Provider Justin Kava, MD      Oxygen   Maintain Oxygen Saturation 88% or higher      Treadmill   MPH 2.5    Grade 0    Minutes 15    METs 2.91      REL-XR   Level 2    Speed 50    Minutes 15    METs 3.27      T5 Nustep   Level 3    SPM 80    Minutes 15    METs 3.27      Biostep-RELP   Level 3    SPM 50    Minutes 15    METs 3.27  Prescription Details   Frequency (times per week) 2    Duration Progress to 30 minutes of continuous aerobic without signs/symptoms of physical distress      Intensity   THRR 40-80% of Max Heartrate 97-132    Ratings of Perceived Exertion 11-13    Perceived Dyspnea 0-4      Progression   Progression Continue to  progress workloads to maintain intensity without signs/symptoms of physical distress.      Resistance Training   Training Prescription Yes    Weight 10 lb    Reps 10-15          Perform Capillary Blood Glucose checks as needed.  Exercise Prescription Changes:   Exercise Prescription Changes     Row Name 06/27/24 1000 07/09/24 1500           Response to Exercise   Blood Pressure (Admit) 154/82 114/68      Blood Pressure (Exercise) 180/92 136/70      Blood Pressure (Exit) 150/90 112/70      Heart Rate (Admit) 62 bpm 63 bpm      Heart Rate (Exercise) 112 bpm 85 bpm      Heart Rate (Exit) 61 bpm 58 bpm      Oxygen Saturation (Admit) 97 % --      Oxygen Saturation (Exercise) 95 % --      Oxygen Saturation (Exit) 96 % --      Rating of Perceived Exertion (Exercise) 11 13      Perceived Dyspnea (Exercise) 0 --      Symptoms none none      Comments results First full day of exercise      Duration -- Continue with 30 min of aerobic exercise without signs/symptoms of physical distress.      Intensity -- THRR unchanged        Progression   Progression -- Continue to progress workloads to maintain intensity without signs/symptoms of physical distress.      Average METs 3.27 3.76        Resistance Training   Training Prescription -- Yes      Weight -- 10 lb      Reps -- 10-15        Interval Training   Interval Training -- No        Treadmill   MPH -- 2.5      Grade -- 0      Minutes -- 15      METs -- 2.91        NuStep   Level -- 6      Minutes -- 15      METs -- 4.6        Oxygen   Maintain Oxygen Saturation -- 88% or higher         Exercise Comments:   Exercise Comments     Row Name 07/04/24 0910           Exercise Comments First full day of exercise!  Patient was oriented to gym and equipment including functions, settings, policies, and procedures.  Patient's individual exercise prescription and treatment plan were reviewed.  All starting  workloads were established based on the results of the 6 minute walk test done at initial orientation visit.  The plan for exercise progression was also introduced and progression will be customized based on patient's performance and goals.          Exercise Goals and Review:   Exercise Goals  Row Name 06/27/24 1015             Exercise Goals   Increase Physical Activity Yes       Intervention Provide advice, education, support and counseling about physical activity/exercise needs.;Develop an individualized exercise prescription for aerobic and resistive training based on initial evaluation findings, risk stratification, comorbidities and participant's personal goals.       Expected Outcomes Short Term: Attend rehab on a regular basis to increase amount of physical activity.;Long Term: Add in home exercise to make exercise part of routine and to increase amount of physical activity.;Long Term: Exercising regularly at least 3-5 days a week.       Increase Strength and Stamina Yes       Intervention Provide advice, education, support and counseling about physical activity/exercise needs.;Develop an individualized exercise prescription for aerobic and resistive training based on initial evaluation findings, risk stratification, comorbidities and participant's personal goals.       Expected Outcomes Short Term: Increase workloads from initial exercise prescription for resistance, speed, and METs.;Short Term: Perform resistance training exercises routinely during rehab and add in resistance training at home;Long Term: Improve cardiorespiratory fitness, muscular endurance and strength as measured by increased METs and functional capacity ( )       Able to understand and use rate of perceived exertion (RPE) scale Yes       Intervention Provide education and explanation on how to use RPE scale       Expected Outcomes Short Term: Able to use RPE daily in rehab to express subjective intensity  level;Long Term:  Able to use RPE to guide intensity level when exercising independently       Able to understand and use Dyspnea scale Yes       Intervention Provide education and explanation on how to use Dyspnea scale       Expected Outcomes Short Term: Able to use Dyspnea scale daily in rehab to express subjective sense of shortness of breath during exertion;Long Term: Able to use Dyspnea scale to guide intensity level when exercising independently       Knowledge and understanding of Target Heart Rate Range (THRR) Yes       Intervention Provide education and explanation of THRR including how the numbers were predicted and where they are located for reference       Expected Outcomes Short Term: Able to state/look up THRR;Short Term: Able to use daily as guideline for intensity in rehab;Long Term: Able to use THRR to govern intensity when exercising independently       Able to check pulse independently Yes       Intervention Provide education and demonstration on how to check pulse in carotid and radial arteries.;Review the importance of being able to check your own pulse for safety during independent exercise       Expected Outcomes Short Term: Able to explain why pulse checking is important during independent exercise;Long Term: Able to check pulse independently and accurately       Understanding of Exercise Prescription Yes       Intervention Provide education, explanation, and written materials on patient's individual exercise prescription       Expected Outcomes Short Term: Able to explain program exercise prescription;Long Term: Able to explain home exercise prescription to exercise independently          Exercise Goals Re-Evaluation :  Exercise Goals Re-Evaluation     Row Name 07/04/24 [phone] 07/09/24 1514  Exercise Goal Re-Evaluation   Exercise Goals Review Increase Physical Activity;Able to understand and use rate of perceived exertion (RPE) scale;Knowledge and  understanding of Target Heart Rate Range (THRR);Understanding of Exercise Prescription;Increase Strength and Stamina;Able to understand and use Dyspnea scale;Able to check pulse independently Increase Physical Activity;Increase Strength and Stamina;Understanding of Exercise Prescription      Comments Reviewed RPE and dyspnea scale, THR and program prescription with pt today.  Pt voiced understanding and was given a copy of goals to take home. Velinda is off to a good start in the program. He did well on the treadmill at a speed of 2.5 mph with no incline. He also did well on the T4 nustep at level 6. We will continue to monitor his progress in the program.      Expected Outcomes Short: Use RPE daily to regulate intensity.  Long: Follow program prescription in THR. Short: Continue to follow current exercise prescription. Long: Continue exercise to improve strength and stamina.         Discharge Exercise Prescription (Final Exercise Prescription Changes):  Exercise Prescription Changes - 07/09/24 1500       Response to Exercise   Blood Pressure (Admit) 114/68    Blood Pressure (Exercise) 136/70    Blood Pressure (Exit) 112/70    Heart Rate (Admit) 63 bpm    Heart Rate (Exercise) 85 bpm    Heart Rate (Exit) 58 bpm    Rating of Perceived Exertion (Exercise) 13    Symptoms none    Comments First full day of exercise    Duration Continue with 30 min of aerobic exercise without signs/symptoms of physical distress.    Intensity THRR unchanged      Progression   Progression Continue to progress workloads to maintain intensity without signs/symptoms of physical distress.    Average METs 3.76      Resistance Training   Training Prescription Yes    Weight 10 lb    Reps 10-15      Interval Training   Interval Training No      Treadmill   MPH 2.5    Grade 0    Minutes 15    METs 2.91      NuStep   Level 6    Minutes 15    METs 4.6      Oxygen   Maintain Oxygen Saturation 88% or higher           Nutrition:  Target Goals: Understanding of nutrition guidelines, daily intake of sodium 1500mg , cholesterol 200mg , calories 30% from fat and 7% or less from saturated fats, daily to have 5 or more servings of fruits and vegetables.  Education: Nutrition 1 -Group instruction provided by verbal, written material, interactive activities, discussions, models, and posters to present general guidelines for heart healthy nutrition including macronutrients, label reading, and promoting whole foods over processed counterparts. Education serves as Pensions consultant of discussion of heart healthy eating for all. Written material provided at class time.    Education: Nutrition 2 -Group instruction provided by verbal, written material, interactive activities, discussions, models, and posters to present general guidelines for heart healthy nutrition including sodium, cholesterol, and saturated fat. Providing guidance of habit forming to improve blood pressure, cholesterol, and body weight. Written material provided at class time.     Biometrics:  Pre Biometrics - 06/27/24 1015       Pre Biometrics   Height 6' 1.7 (1.872 m)    Weight 240 lb 8 oz (109.1 kg)  Waist Circumference 46.3 inches    Hip Circumference 43 inches    Waist to Hip Ratio 1.08 %    BMI (Calculated) 31.13    Single Leg Stand 10 seconds           Nutrition Therapy Plan and Nutrition Goals:  Nutrition Therapy & Goals - 06/27/24 1017       Nutrition Therapy   RD appointment deferred Yes      Personal Nutrition Goals   Nutrition Goal RD appointment deferred at this time      Intervention Plan   Intervention Prescribe, educate and counsel regarding individualized specific dietary modifications aiming towards targeted core components such as weight, hypertension, lipid management, diabetes, heart failure and other comorbidities.    Expected Outcomes Short Term Goal: Understand basic principles of dietary content,  such as calories, fat, sodium, cholesterol and nutrients.          Nutrition Assessments:  MEDIFICTS Score Key: >=70 Need to make dietary changes  40-70 Heart Healthy Diet <= 40 Therapeutic Level Cholesterol Diet  Flowsheet Row Cardiac Rehab from 06/27/2024 in [facility] Cardiac and Pulmonary Rehab  Picture Your Plate Total Score on Admission 60   Picture Your Plate Scores: <59 Unhealthy dietary pattern with much room for improvement. 41-50 Dietary pattern unlikely to meet recommendations for good health and room for improvement. 51-60 More healthful dietary pattern, with some room for improvement.  >60 Healthy dietary pattern, although there may be some specific behaviors that could be improved.    Nutrition Goals Re-Evaluation:   Nutrition Goals Discharge (Final Nutrition Goals Re-Evaluation):   Psychosocial: Target Goals: Acknowledge presence or absence of significant depression and/or stress, maximize coping skills, provide positive support system. Participant is able to verbalize types and ability to use techniques and skills needed for reducing stress and depression.   Education: Stress, Anxiety, and Depression - Group verbal and visual presentation to define topics covered.  Reviews how body is impacted by stress, anxiety, and depression.  Also discusses healthy ways to reduce stress and to treat/manage anxiety and depression. Written material provided at class time.   Education: Sleep Hygiene -Provides group verbal and written instruction about how sleep can affect your health.  Define sleep hygiene, discuss sleep cycles and impact of sleep habits. Review good sleep hygiene tips.   Initial Review & Psychosocial Screening:  Initial Psych Review & Screening - 06/26/24 0957       Initial Review   Current issues with None Identified      Family Dynamics   Good Support System? 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 Tim stated that he does not have any stressors and has a good support system of his wife and family. Patient stated he particularly enjoys his time with his 10 grandchildren.Tim was pleasant to speak with and stated that he enjoys yard work and remains active. The patient stated he does have back pain/issues, but other than that manages well with his physical and mental health.      Barriers   Psychosocial barriers to participate in program The patient should benefit from training in stress management and relaxation.      Screening Interventions   Interventions To provide support and resources with identified psychosocial needs;Provide feedback about the scores to participant;Encouraged to exercise    Expected Outcomes Short Term goal: Identification and review with participant of any Quality of Life or Depression  concerns found by scoring the questionnaire.;Long Term goal: The participant improves quality of Life and PHQ9 Scores as seen by post scores and/or verbalization of changes          Quality of Life Scores:   Quality of Life - 06/27/24 1017       Quality of Life   Select Quality of Life      Quality of Life Scores   Health/Function Pre 12.73 %    Socioeconomic Pre 12.69 %    Psych/Spiritual Pre 12.79 %    Family Pre 13.1 %    GLOBAL Pre 12.79 %         Scores of 19 and below usually indicate a poorer quality of life in these areas.  A difference of  2-3 points is a clinically meaningful difference.  A difference of 2-3 points in the total score of the Quality of Life Index has been associated with significant improvement in overall quality of life, self-image, physical symptoms, and general health in studies assessing change in quality of life.  PHQ-9: Review Flowsheet  More data exists      06/27/2024 02/12/2024 10/05/2023 09/15/2023 09/01/2023  Depression screen PHQ 2/9  Decreased Interest 0 0 0 0 0  Down, Depressed, Hopeless 0 0 0 0 0  PHQ - 2  Score 0 0 0 0 0  Altered sleeping 0 0 0 0 0  Tired, decreased energy 0 1 0 0 0  Change in appetite 0 0 0 0 0  Feeling bad or failure about yourself  0 0 0 0 0  Trouble concentrating 0 0 0 0 0  Moving slowly or fidgety/restless 0 0 0 0 0  Suicidal thoughts 0 0 0 0 0  PHQ-9 Score 0 1 0 0 0  Difficult doing work/chores Not difficult at all Not difficult at all Not difficult at all Not difficult at all Not difficult at all   Interpretation of Total Score  Total Score Depression Severity:  1-4 = Minimal depression, 5-9 = Mild depression, 10-14 = Moderate depression, 15-19 = Moderately severe depression, 20-27 = Severe depression   Psychosocial Evaluation and Intervention:  Psychosocial Evaluation - 06/26/24 0958       Psychosocial Evaluation & Interventions   Interventions Stress management education;Relaxation education;Encouraged to exercise with the program and follow exercise prescription    Comments 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 Tim stated that he does not have any stressors and has a good support system of his wife and family. Patient stated he particularly enjoys his time with his 10 grandchildren.Tim was pleasant to speak with and stated that he enjoys yard work and remains active. The patient stated he does have back pain/issues, but other than that manages well with his physical and mental health.    Expected Outcomes ST: Attend cardiac rehab for education and exercise; LT: Develop and maintain positive self-care habits    Continue Psychosocial Services  Follow up required by staff          Psychosocial Re-Evaluation:   Psychosocial Discharge (Final Psychosocial Re-Evaluation):   Vocational Rehabilitation: Provide vocational rehab assistance to qualifying candidates.   Vocational Rehab Evaluation & Intervention:  Vocational Rehab - 06/26/24 0957       Initial Vocational Rehab Evaluation &  Intervention   Assessment shows need for Vocational Rehabilitation No          Education: Education Goals: Education classes will be provided  on a variety of topics geared toward better understanding of heart health and risk factor modification. Participant will state understanding/return demonstration of topics presented as noted by education test scores.  Learning Barriers/Preferences:  Learning Barriers/Preferences - 06/26/24 0957       Learning Barriers/Preferences   Learning Barriers None    Learning Preferences Group Instruction;Individual Instruction;Verbal Instruction;Video;Written Material          General Cardiac Education Topics:  AED/CPR: - Group verbal and written instruction with the use of models to demonstrate the basic use of the AED with the basic ABC's of resuscitation.   Test and Procedures: - Group verbal and visual presentation and models provide information about basic cardiac anatomy and function. Reviews the testing methods done to diagnose heart disease and the outcomes of the test results. Describes the treatment choices: Medical Management, Angioplasty, or Coronary Bypass Surgery for treating various heart conditions including Myocardial Infarction, Angina, Valve Disease, and Cardiac Arrhythmias. Written material provided at class time.   Medication Safety: - Group verbal and visual instruction to review commonly prescribed medications for heart and lung disease. Reviews the medication, class of the drug, and side effects. Includes the steps to properly store meds and maintain the prescription regimen. Written material provided at class time.   Intimacy: - Group verbal instruction through game format to discuss how heart and lung disease can affect sexual intimacy. Written material provided at class time.   Know Your Numbers and Heart Failure: - Group verbal and visual instruction to discuss disease risk factors for cardiac and pulmonary disease  and treatment options.  Reviews associated critical values for Overweight/Obesity, Hypertension, Cholesterol, and Diabetes.  Discusses basics of heart failure: signs/symptoms and treatments.  Introduces Heart Failure Zone chart for action plan for heart failure. Written material provided at class time.   Infection Prevention: - Provides verbal and written material to individual with discussion of infection control including proper hand washing and proper equipment cleaning during exercise session. Flowsheet Row Cardiac Rehab from 06/27/2024 in [facility] Cardiac and Pulmonary Rehab  Date 06/27/24  Educator MB  Instruction Review Code 1- Verbalizes Understanding    Falls Prevention: - Provides verbal and written material to individual with discussion of falls prevention and safety. Flowsheet Row Cardiac Rehab from 06/27/2024 in [facility] Cardiac and Pulmonary Rehab  Date 06/27/24  Educator MB  Instruction Review Code 1- Verbalizes Understanding    Other: -Provides group and verbal instruction on various topics (see comments) Flowsheet Row Cardiac Rehab from 06/27/2024 in [facility] Cardiac and Pulmonary Rehab  Date 06/27/24  [facility] and Cardiac Procedures]    Knowledge Questionnaire Score:  Knowledge Questionnaire Score - 06/27/24 1018       Knowledge Questionnaire Score   Pre Score 23/26          Core Components/Risk Factors/Patient Goals at Admission:  Personal Goals and Risk Factors at Admission - 06/27/24 1019       Core Components/Risk Factors/Patient Goals on Admission    Weight Management Weight Loss;Yes    Intervention Weight Management: Develop a combined nutrition and exercise program designed to reach desired caloric intake, while maintaining appropriate intake of nutrient and fiber, sodium and fats, and appropriate energy expenditure required for the weight goal.;Weight Management: Provide education and appropriate resources to help participant work on and attain dietary goals.;Weight  Management/Obesity: Establish reasonable short term and long term weight goals.    Admit Weight 240 lb 8 oz (109.1 kg)    Goal Weight: Short Term 230 lb (  104.3 kg)    Goal Weight: Long Term 220 lb (99.8 kg)    Expected Outcomes Short Term: Continue to assess and modify interventions until short term weight is achieved;Long Term: Adherence to nutrition and physical activity/exercise program aimed toward attainment of established weight goal;Weight Loss: Understanding of general recommendations for a balanced deficit meal plan, which promotes 1-2 lb weight loss per week and includes a negative energy balance of [phone] kcal/d;Understanding recommendations for meals to include 15-35% energy as protein, 25-35% energy from fat, 35-60% energy from carbohydrates, less than 200mg  of dietary cholesterol, 20-35 gm of total fiber daily;Understanding of distribution of calorie intake throughout the day with the consumption of 4-5 meals/snacks    Hypertension Yes    Intervention Provide education on lifestyle modifcations including regular physical activity/exercise, weight management, moderate sodium restriction and increased consumption of fresh fruit, vegetables, and low fat dairy, alcohol moderation, and smoking cessation.;Monitor prescription use compliance.    Expected Outcomes Short Term: Continued assessment and intervention until BP is < 140/67mm HG in hypertensive participants. < 130/63mm HG in hypertensive participants with diabetes, heart failure or chronic kidney disease.;Long Term: Maintenance of blood pressure at goal levels.    Lipids Yes    Intervention Provide education and support for participant on nutrition & aerobic/resistive exercise along with prescribed medications to achieve LDL 70mg , HDL >40mg .    Expected Outcomes Short Term: Participant states understanding of desired cholesterol values and is compliant with medications prescribed. Participant is following exercise prescription and  nutrition guidelines.;Long Term: Cholesterol controlled with medications as prescribed, with individualized exercise RX and with personalized nutrition plan. Value goals: LDL < 70mg , HDL > 40 mg.          Education:Diabetes - Individual verbal and written instruction to review signs/symptoms of diabetes, desired ranges of glucose level fasting, after meals and with exercise. Acknowledge that pre and post exercise glucose checks will be done for 3 sessions at entry of program.   Core Components/Risk Factors/Patient Goals Review:    Core Components/Risk Factors/Patient Goals at Discharge (Final Review):    ITP Comments:  ITP Comments     Row Name 06/26/24 1001 06/27/24 1006 07/04/24 0910 07/17/24 1001     ITP Comments Initial phone call completed. Diagnosis can be found in [facility] 06/12/2024. EP Orientation scheduled for Thursday, June 27, 2024, @ 0800. Completed and gym orientation for cardiac rehab. Initial ITP created and sent for review to Dr. Oneil Pinal, Medical Director. First full day of exercise!  Patient was oriented to gym and equipment including functions, settings, policies, and procedures.  Patient's individual exercise prescription and treatment plan were reviewed.  All starting workloads were established based on the results of the 6 minute walk test done at initial orientation visit.  The plan for exercise progression was also introduced and progression will be customized based on patient's performance and goals. 30 Day review completed. Medical Director ITP review done, changes made as directed, and signed approval by Medical Director. New to program.       Comments: 30 day review

## 2024-07-23 ENCOUNTER — Encounter

## 2024-07-23 DIAGNOSIS — Z955 Presence of coronary angioplasty implant and graft: Secondary | ICD-10-CM | POA: Diagnosis not present

## 2024-07-23 NOTE — Progress Notes (Signed)
 Daily Session Note  Patient Details  Name: [name] MRN: [id number] Date of Birth: 11-17-1955 Referring Provider:   Flowsheet Row Cardiac Rehab from 06/27/2024 in [facility] Cardiac and Pulmonary Rehab  Referring Provider Florencio Kava, MD    Encounter Date: 07/23/2024  Check In:  Session Check In - 07/23/24 0911       Check-In   Supervising physician immediately available to respond to emergencies See telemetry face sheet for immediately available ER MD    Location [facility]-Cardiac & Pulmonary Rehab    Staff Present Burnard Davenport RN,BSN,MPA;Margaret Best, MS, Exercise Physiologist;Noah Tickle, BS, Exercise Physiologist;Maxon Conetta BS, Exercise Physiologist    Virtual Visit No    Medication changes reported     No    Fall or balance concerns reported    No    Warm-up and Cool-down Performed on first and last piece of equipment    Resistance Training Performed Yes    VAD Patient? No    PAD/SET Patient? No      Pain Assessment   Currently in Pain? No/denies             Social History   Tobacco Use  Smoking Status Never  Smokeless Tobacco Former   Types: Chew   Quit date: 03/2020    Goals Met:  Independence with exercise equipment Exercise tolerated well No report of concerns or symptoms today Strength training completed today  Goals Unmet:  Not Applicable  Comments: Pt able to follow exercise prescription today without complaint.  Will continue to monitor for progression.    Dr. Oneil Pinal is Medical Director for [facility] Cardiac Rehabilitation.  Dr. Fuad Aleskerov is Medical Director for [facility] Pulmonary Rehabilitation.

## 2024-07-25 ENCOUNTER — Encounter: Attending: Internal Medicine | Admitting: Emergency Medicine

## 2024-07-25 DIAGNOSIS — Z955 Presence of coronary angioplasty implant and graft: Secondary | ICD-10-CM | POA: Diagnosis not present

## 2024-07-25 NOTE — Progress Notes (Signed)
 Daily Session Note  Patient Details  Name: MIQUEL STACKS MRN: [id number] Date of Birth: 1956-09-18 Referring Provider:   Flowsheet Row Cardiac Rehab from 06/27/2024 in [facility] Cardiac and Pulmonary Rehab  Referring Provider Florencio Kava, MD    Encounter Date: 07/25/2024  Check In:  Session Check In - 07/25/24 0942       Check-In   Supervising physician immediately available to respond to emergencies See telemetry face sheet for immediately available ER MD    Location [facility]-Cardiac & Pulmonary Rehab    Staff Present Rollene Paterson, MS, Exercise Physiologist;Tildon Silveria Vita RN,BSN;[facility] BS, Exercise Physiologist    Virtual Visit No    Medication changes reported     No    Fall or balance concerns reported    No    Warm-up and Cool-down Performed on first and last piece of equipment    Resistance Training Performed Yes    VAD Patient? No    PAD/SET Patient? No      Pain Assessment   Currently in Pain? No/denies             Social History   Tobacco Use  Smoking Status Never  Smokeless Tobacco Former   Types: Chew   Quit date: 03/2020    Goals Met:  Independence with exercise equipment Exercise tolerated well No report of concerns or symptoms today Strength training completed today  Goals Unmet:  Not Applicable  Comments: Pt able to follow exercise prescription today without complaint.  Will continue to monitor for progression.    Dr. Oneil Pinal is Medical Director for [facility] Cardiac Rehabilitation.  Dr. Fuad Aleskerov is Medical Director for [facility] Pulmonary Rehabilitation.

## 2024-07-30 ENCOUNTER — Encounter

## 2024-08-01 ENCOUNTER — Encounter

## 2024-08-06 ENCOUNTER — Encounter

## 2024-08-08 ENCOUNTER — Encounter

## 2024-08-13 ENCOUNTER — Encounter

## 2024-08-13 ENCOUNTER — Ambulatory Visit

## 2024-08-13 DIAGNOSIS — S32009K Unspecified fracture of unspecified lumbar vertebra, subsequent encounter for fracture with nonunion: Secondary | ICD-10-CM | POA: Diagnosis not present

## 2024-08-14 DIAGNOSIS — Z955 Presence of coronary angioplasty implant and graft: Secondary | ICD-10-CM

## 2024-08-14 DIAGNOSIS — C44729 Squamous cell carcinoma of skin of left lower limb, including hip: Secondary | ICD-10-CM | POA: Diagnosis not present

## 2024-08-14 NOTE — Progress Notes (Signed)
 Cardiac Individual Treatment Plan  Patient Details  Name: [name] MRN: [id number] Date of Birth: Apr 27, 1956 Referring Provider:   Flowsheet Row Cardiac Rehab from 06/27/2024 in [facility] Cardiac and Pulmonary Rehab  Referring Provider Florencio Kava, MD    Initial Encounter Date:  Flowsheet Row Cardiac Rehab from 06/27/2024 in [facility] Cardiac and Pulmonary Rehab  Date 06/27/24    Visit Diagnosis: Status post coronary artery stent placement  Patient's Home Medications on Admission:  Current Outpatient Medications:    acetaminophen  (TYLENOL ) 325 MG tablet, Take 650 mg by mouth every 6 (six) hours as needed for moderate pain (pain score 4-6)., Disp: , Rfl:    apixaban  (ELIQUIS ) 5 MG TABS tablet, Take 1 tablet (5 mg total) by mouth 2 (two) times daily. Restart Wednesday, November 16 (Patient taking differently: Take 5 mg by mouth 2 (two) times daily.), Disp: 60 tablet, Rfl: 0   celecoxib  (CELEBREX ) 200 MG capsule, Take 200 mg by mouth daily. (Patient taking differently: Take 200 mg by mouth in the morning.), Disp: , Rfl:    clopidogrel (PLAVIX) 75 MG tablet, Take 75 mg by mouth in the morning., Disp: , Rfl:    diltiazem  (CARDIZEM  CD) 180 MG 24 hr capsule, Take 1 capsule (180 mg total) by mouth 2 (two) times daily. (Patient taking differently: Take 180 mg by mouth in the morning.), Disp: 180 capsule, Rfl: 1   ezetimibe (ZETIA) 10 MG tablet, Take 10 mg by mouth at bedtime., Disp: , Rfl:    fexofenadine  (ALLEGRA ) 180 MG tablet, Take 1 tablet (180 mg total) by mouth daily., Disp: 90 tablet, Rfl: 1   fluticasone  (FLONASE ) 50 MCG/ACT nasal spray, Place 2 sprays into both nostrils daily. (Patient taking differently: Place 2 sprays into both nostrils daily as needed for allergies or rhinitis.), Disp: 18 mL, Rfl: 5   methocarbamol  (ROBAXIN ) 500 MG tablet, Take 500 mg by mouth daily as needed for muscle spasms., Disp: , Rfl:    metoprolol  succinate (TOPROL -XL) 50 MG 24 hr tablet, Take 1 tablet (50 mg  total) by mouth daily. cardio (Patient taking differently: Take 25 mg by mouth 2 (two) times daily. cardio), Disp: 90 tablet, Rfl: 1   montelukast  (SINGULAIR ) 10 MG tablet, Take 1 tablet (10 mg total) by mouth at bedtime., Disp: 90 tablet, Rfl: 1   nitroGLYCERIN (NITROSTAT) 0.4 MG SL tablet, Place 0.4 mg under the tongue every 5 (five) minutes as needed for chest pain., Disp: , Rfl:    pantoprazole  (PROTONIX ) 40 MG tablet, Take 1 tablet (40 mg total) by mouth every evening. (Patient taking differently: Take 40 mg by mouth in the morning.), Disp: 90 tablet, Rfl: 1   potassium chloride  SA (KLOR-CON  M20) 20 MEQ tablet, Take 1 tablet (20 mEq total) by mouth 3 (three) times daily. (Patient taking differently: Take 20-40 mEq by mouth See admin instructions. Take 20 meq in the morning and 40 meq in the evening), Disp: 270 tablet, Rfl: 1   rosuvastatin  (CRESTOR ) 5 MG tablet, TAKE 1 TABLET BY MOUTH THREE TIMES A WEEK (Patient taking differently: Take 5 mg by mouth See admin instructions. TAKE 1 TABLET BY MOUTH THREE TIMES A WEEK Sun, Tues, and  thurs, in the evening), Disp: 90 tablet, Rfl: 1   tadalafil  (CIALIS ) 10 MG tablet, Take 1 tablet (10 mg total) by mouth every other day. Okay to take an additional 10 mg boost dose as needed 45 minutes prior to sexual activity (Patient taking differently: Take 10 mg by mouth daily as  needed for erectile dysfunction. Okay to take an additional 10 mg boost dose as needed 45 minutes prior to sexual activity), Disp: 90 tablet, Rfl: 3   tamsulosin  (FLOMAX ) 0.4 MG CAPS capsule, Take 1 capsule (0.4 mg total) by mouth daily., Disp: 90 capsule, Rfl: 3   traMADol  (ULTRAM ) 50 MG tablet, Take 50 mg by mouth every 6 (six) hours as needed., Disp: , Rfl:    triamterene-hydrochlorothiazide  (MAXZIDE-25) 37.5-25 MG tablet, Take 0.5 tablets by mouth in the morning., Disp: , Rfl:    valsartan  (DIOVAN ) 320 MG tablet, Take 1 tablet (320 mg total) by mouth daily. (Patient taking differently: Take  160 mg by mouth at bedtime.), Disp: 90 tablet, Rfl: 1  Past Medical History: Past Medical History:  Diagnosis Date   Acquired deafness, right    per pt completed deafness right ear since age 2   Allergic rhinitis, seasonal    Anticoagulant long-term use    eliquis --- managed by cardiology   BPH associated with nocturia    urologist--- dr alvaro   Cancer [facility])    basal and squamous cell   Chronic low back pain    DDD (degenerative disc disease), lumbosacral    Foraminal stenosis of lumbosacral region    GERD (gastroesophageal reflux disease)    Heart murmur    History of kidney stones    History of skin cancer    Hydronephrosis, left    Hyperlipidemia, mixed    Hypertension, essential    followed by pcp and cardiology   Hypokalemia    OA (osteoarthritis)    Paroxysmal atrial fibrillation (HCC) 01/28/2017   cardiologist--- dr hester;   pt had ETT 03-16-2017 per cardio note normal, event monitor 02-09-2017 showed baseline SR some peroids  Afib with controlled rate with occ PAC/ PVC;  per pt had echo many yrs ago prior to AFib    Tobacco Use: Social History   Tobacco Use  Smoking Status Never  Smokeless Tobacco Former   Types: Chew   Quit date: 03/2020    Labs: Review Flowsheet  More data exists      Latest Ref Rng & Units 06/15/2022 08/24/2022 03/01/2023 09/15/2023 02/13/2024  Labs for ITP Cardiac and Pulmonary Rehab  Cholestrol 100 - 199 mg/dL 871  - 865  785  843   LDL (calc) 0 - 99 mg/dL 67  - 78  853  97   HDL-C >39 mg/dL 46  - 40  43  43   Trlycerides 0 - 149 mg/dL 76  - 84  859  83   Hemoglobin A1c 4.8 - 5.6 % - 6.1  6.4  - -     Exercise Target Goals: Exercise Program Goal: Individual exercise prescription set using results from initial 6 min walk test and THRR while considering  patient's activity barriers and safety.   Exercise Prescription Goal: Initial exercise prescription builds to 30-45 minutes a day of aerobic activity, 2-3 days per week.  Home  exercise guidelines will be given to patient during program as part of exercise prescription that the participant will acknowledge.   Education: Aerobic Exercise: - Group verbal and visual presentation on the components of exercise prescription. Introduces F.I.T.T principle from ACSM for exercise prescriptions.  Reviews F.I.T.T. principles of aerobic exercise including progression. Written material provided at class time.   Education: Resistance Exercise: - Group verbal and visual presentation on the components of exercise prescription. Introduces F.I.T.T principle from ACSM for exercise prescriptions  Reviews F.I.T.T. principles of resistance exercise  including progression. Written material provided at class time.    Education: Exercise & Equipment Safety: - Individual verbal instruction and demonstration of equipment use and safety with use of the equipment. Flowsheet Row Cardiac Rehab from 06/27/2024 in [facility] Cardiac and Pulmonary Rehab  Date 06/27/24  Educator MB  Instruction Review Code 1- Verbalizes Understanding    Education: Exercise Physiology & General Exercise Guidelines: - Group verbal and written instruction with models to review the exercise physiology of the cardiovascular system and associated critical values. Provides general exercise guidelines with specific guidelines to those with heart or lung disease. Written material provided at class time. Flowsheet Row Cardiac Rehab from 06/27/2024 in [facility] Cardiac and Pulmonary Rehab  Education need identified 06/27/24    Education: Flexibility, Balance, Mind/Body Relaxation: - Group verbal and visual presentation with interactive activity on the components of exercise prescription. Introduces F.I.T.T principle from ACSM for exercise prescriptions. Reviews F.I.T.T. principles of flexibility and balance exercise training including progression. Also discusses the mind body connection.  Reviews various relaxation techniques to help reduce  and manage stress (i.e. Deep breathing, progressive muscle relaxation, and visualization). Balance handout provided to take home. Written material provided at class time.   Activity Barriers & Risk Stratification:  Activity Barriers & Cardiac Risk Stratification - 06/27/24 1007       Activity Barriers & Cardiac Risk Stratification   Activity Barriers Back Problems;Other (comment)    Comments R leg    Cardiac Risk Stratification Moderate          6 Minute Walk:  6 Minute Walk     Row Name 06/27/24 1006         6 Minute Walk   Phase Initial     Distance 1300 feet     Walk Time 6 minutes     # of Rest Breaks 0     MPH 2.46     METS 3.27     RPE 11     Perceived Dyspnea  0     VO2 Peak 11.44     Symptoms No     Resting HR 62 bpm     Resting BP 154/82     Resting Oxygen Saturation  97 %     Exercise Oxygen Saturation  during 6 min walk 95 %     Max Ex. HR 112 bpm     Max Ex. BP 180/92     2 Minute Post BP 168/90        Oxygen Initial Assessment:   Oxygen Re-Evaluation:   Oxygen Discharge (Final Oxygen Re-Evaluation):   Initial Exercise Prescription:  Initial Exercise Prescription - 06/27/24 1000       Date of Initial Exercise RX and Referring Provider   Date 06/27/24    Referring Provider Florencio Kava, MD      Oxygen   Maintain Oxygen Saturation 88% or higher      Treadmill   MPH 2.5    Grade 0    Minutes 15    METs 2.91      REL-XR   Level 2    Speed 50    Minutes 15    METs 3.27      T5 Nustep   Level 3    SPM 80    Minutes 15    METs 3.27      Biostep-RELP   Level 3    SPM 50    Minutes 15    METs 3.27  Prescription Details   Frequency (times per week) 2    Duration Progress to 30 minutes of continuous aerobic without signs/symptoms of physical distress      Intensity   THRR 40-80% of Max Heartrate 97-132    Ratings of Perceived Exertion 11-13    Perceived Dyspnea 0-4      Progression   Progression Continue to  progress workloads to maintain intensity without signs/symptoms of physical distress.      Resistance Training   Training Prescription Yes    Weight 10 lb    Reps 10-15          Perform Capillary Blood Glucose checks as needed.  Exercise Prescription Changes:   Exercise Prescription Changes     Row Name 06/27/24 1000 07/09/24 1500 07/23/24 0900 08/06/24 1400       Response to Exercise   Blood Pressure (Admit) 154/82 114/68 118/82 118/78    Blood Pressure (Exercise) 180/92 136/70 122/70 160/80    Blood Pressure (Exit) 150/90 112/70 128/80 118/72    Heart Rate (Admit) 62 bpm 63 bpm 56 bpm 64 bpm    Heart Rate (Exercise) 112 bpm 85 bpm 90 bpm 102 bpm    Heart Rate (Exit) 61 bpm 58 bpm 65 bpm 78 bpm    Oxygen Saturation (Admit) 97 % -- -- --    Oxygen Saturation (Exercise) 95 % -- -- --    Oxygen Saturation (Exit) 96 % -- -- --    Rating of Perceived Exertion (Exercise) 11 13 11 12     Perceived Dyspnea (Exercise) 0 -- -- --    Symptoms none none none none    Comments results First full day of exercise -- --    Duration -- Continue with 30 min of aerobic exercise without signs/symptoms of physical distress. Continue with 30 min of aerobic exercise without signs/symptoms of physical distress. Continue with 30 min of aerobic exercise without signs/symptoms of physical distress.    Intensity -- THRR unchanged THRR unchanged THRR unchanged      Progression   Progression -- Continue to progress workloads to maintain intensity without signs/symptoms of physical distress. Continue to progress workloads to maintain intensity without signs/symptoms of physical distress. Continue to progress workloads to maintain intensity without signs/symptoms of physical distress.    Average METs 3.27 3.76 3.09 2.87      Resistance Training   Training Prescription -- Yes Yes Yes    Weight -- 10 lb 10 lb 10 lb    Reps -- 10-15 10-15 10-15      Interval Training   Interval Training -- No No No       Treadmill   MPH -- 2.5 2.7 2.8    Grade -- 0 0 0    Minutes -- 15 15 15     METs -- 2.91 3.07 3.14      NuStep   Level -- 6 -- 4  T6 nustep    Minutes -- 15 -- 15    METs -- 4.6 -- 2.4      REL-XR   Level -- -- -- 4    Minutes -- -- -- 15      T5 Nustep   Level -- -- 5 --    Minutes -- -- 15 --    METs -- -- 3.1 --      Oxygen   Maintain Oxygen Saturation -- 88% or higher 88% or higher 88% or higher       Exercise Comments:  Exercise Comments     Row Name 07/04/24 0910           Exercise Comments First full day of exercise!  Patient was oriented to gym and equipment including functions, settings, policies, and procedures.  Patient's individual exercise prescription and treatment plan were reviewed.  All starting workloads were established based on the results of the 6 minute walk test done at initial orientation visit.  The plan for exercise progression was also introduced and progression will be customized based on patient's performance and goals.          Exercise Goals and Review:   Exercise Goals     Row Name 06/27/24 1015             Exercise Goals   Increase Physical Activity Yes       Intervention Provide advice, education, support and counseling about physical activity/exercise needs.;Develop an individualized exercise prescription for aerobic and resistive training based on initial evaluation findings, risk stratification, comorbidities and participant's personal goals.       Expected Outcomes Short Term: Attend rehab on a regular basis to increase amount of physical activity.;Long Term: Add in home exercise to make exercise part of routine and to increase amount of physical activity.;Long Term: Exercising regularly at least 3-5 days a week.       Increase Strength and Stamina Yes       Intervention Provide advice, education, support and counseling about physical activity/exercise needs.;Develop an individualized exercise prescription for aerobic  and resistive training based on initial evaluation findings, risk stratification, comorbidities and participant's personal goals.       Expected Outcomes Short Term: Increase workloads from initial exercise prescription for resistance, speed, and METs.;Short Term: Perform resistance training exercises routinely during rehab and add in resistance training at home;Long Term: Improve cardiorespiratory fitness, muscular endurance and strength as measured by increased METs and functional capacity ( )       Able to understand and use rate of perceived exertion (RPE) scale Yes       Intervention Provide education and explanation on how to use RPE scale       Expected Outcomes Short Term: Able to use RPE daily in rehab to express subjective intensity level;Long Term:  Able to use RPE to guide intensity level when exercising independently       Able to understand and use Dyspnea scale Yes       Intervention Provide education and explanation on how to use Dyspnea scale       Expected Outcomes Short Term: Able to use Dyspnea scale daily in rehab to express subjective sense of shortness of breath during exertion;Long Term: Able to use Dyspnea scale to guide intensity level when exercising independently       Knowledge and understanding of Target Heart Rate Range (THRR) Yes       Intervention Provide education and explanation of THRR including how the numbers were predicted and where they are located for reference       Expected Outcomes Short Term: Able to state/look up THRR;Short Term: Able to use daily as guideline for intensity in rehab;Long Term: Able to use THRR to govern intensity when exercising independently       Able to check pulse independently Yes       Intervention Provide education and demonstration on how to check pulse in carotid and radial arteries.;Review the importance of being able to check your own pulse for safety during independent exercise  Expected Outcomes Short Term: Able to  explain why pulse checking is important during independent exercise;Long Term: Able to check pulse independently and accurately       Understanding of Exercise Prescription Yes       Intervention Provide education, explanation, and written materials on patient's individual exercise prescription       Expected Outcomes Short Term: Able to explain program exercise prescription;Long Term: Able to explain home exercise prescription to exercise independently          Exercise Goals Re-Evaluation :  Exercise Goals Re-Evaluation     Row Name 07/04/24 0911 07/09/24 1514 07/23/24 0934 08/06/24 1423       Exercise Goal Re-Evaluation   Exercise Goals Review Increase Physical Activity;Able to understand and use rate of perceived exertion (RPE) scale;Knowledge and understanding of Target Heart Rate Range (THRR);Understanding of Exercise Prescription;Increase Strength and Stamina;Able to understand and use Dyspnea scale;Able to check pulse independently Increase Physical Activity;Increase Strength and Stamina;Understanding of Exercise Prescription Increase Physical Activity;Increase Strength and Stamina;Understanding of Exercise Prescription Increase Physical Activity;Increase Strength and Stamina;Understanding of Exercise Prescription    Comments Reviewed RPE and dyspnea scale, THR and program prescription with pt today.  Pt voiced understanding and was given a copy of goals to take home. Velinda is off to a good start in the program. He did well on the treadmill at a speed of 2.5 mph with no incline. He also did well on the T4 nustep at level 6. We will continue to monitor his progress in the program. Velinda has only attended rehab once since the last review. During his one session he did increase his treadmill speed to 2.7 mph with no incline. He also improved to level 5 on the T5 nustep. We will continue to monitor his progress in the program. Velinda has only attended two sessions of rehab since the last review. During  his one session he did increase his treadmill speed to 2.8 mph with no incline. He also improved to level 4 on the XR. We will continue to monitor his progress in the program.    Expected Outcomes Short: Use RPE daily to regulate intensity.  Long: Follow program prescription in THR. Short: Continue to follow current exercise prescription. Long: Continue exercise to improve strength and stamina. Short: Attend rehab more consistently. Long: Continue exercise to improve strength and stamina. Short: Attend rehab more consistently. Long: Continue exercise to improve strength and stamina.       Discharge Exercise Prescription (Final Exercise Prescription Changes):  Exercise Prescription Changes - 08/06/24 1400       Response to Exercise   Blood Pressure (Admit) 118/78    Blood Pressure (Exercise) 160/80    Blood Pressure (Exit) 118/72    Heart Rate (Admit) 64 bpm    Heart Rate (Exercise) 102 bpm    Heart Rate (Exit) 78 bpm    Rating of Perceived Exertion (Exercise) 12    Symptoms none    Duration Continue with 30 min of aerobic exercise without signs/symptoms of physical distress.    Intensity THRR unchanged      Progression   Progression Continue to progress workloads to maintain intensity without signs/symptoms of physical distress.    Average METs 2.87      Resistance Training   Training Prescription Yes    Weight 10 lb    Reps 10-15      Interval Training   Interval Training No      Treadmill   MPH 2.8  Grade 0    Minutes 15    METs 3.14      NuStep   Level 4   T6 nustep   Minutes 15    METs 2.4      REL-XR   Level 4    Minutes 15      Oxygen   Maintain Oxygen Saturation 88% or higher          Nutrition:  Target Goals: Understanding of nutrition guidelines, daily intake of sodium 1500mg , cholesterol 200mg , calories 30% from fat and 7% or less from saturated fats, daily to have 5 or more servings of fruits and vegetables.  Education: Nutrition 1 -Group  instruction provided by verbal, written material, interactive activities, discussions, models, and posters to present general guidelines for heart healthy nutrition including macronutrients, label reading, and promoting whole foods over processed counterparts. Education serves as Pensions consultant of discussion of heart healthy eating for all. Written material provided at class time.    Education: Nutrition 2 -Group instruction provided by verbal, written material, interactive activities, discussions, models, and posters to present general guidelines for heart healthy nutrition including sodium, cholesterol, and saturated fat. Providing guidance of habit forming to improve blood pressure, cholesterol, and body weight. Written material provided at class time.     Biometrics:  Pre Biometrics - 06/27/24 1015       Pre Biometrics   Height 6' 1.7 (1.872 m)    Weight 240 lb 8 oz (109.1 kg)    Waist Circumference 46.3 inches    Hip Circumference 43 inches    Waist to Hip Ratio 1.08 %    BMI (Calculated) 31.13    Single Leg Stand 10 seconds           Nutrition Therapy Plan and Nutrition Goals:  Nutrition Therapy & Goals - 06/27/24 1017       Nutrition Therapy   RD appointment deferred Yes      Personal Nutrition Goals   Nutrition Goal RD appointment deferred at this time      Intervention Plan   Intervention Prescribe, educate and counsel regarding individualized specific dietary modifications aiming towards targeted core components such as weight, hypertension, lipid management, diabetes, heart failure and other comorbidities.    Expected Outcomes Short Term Goal: Understand basic principles of dietary content, such as calories, fat, sodium, cholesterol and nutrients.          Nutrition Assessments:  MEDIFICTS Score Key: >=70 Need to make dietary changes  40-70 Heart Healthy Diet <= 40 Therapeutic Level Cholesterol Diet  Flowsheet Row Cardiac Rehab from 06/27/2024 in [facility]  Cardiac and Pulmonary Rehab  Picture Your Plate Total Score on Admission 60   Picture Your Plate Scores: <59 Unhealthy dietary pattern with much room for improvement. 41-50 Dietary pattern unlikely to meet recommendations for good health and room for improvement. 51-60 More healthful dietary pattern, with some room for improvement.  >60 Healthy dietary pattern, although there may be some specific behaviors that could be improved.    Nutrition Goals Re-Evaluation:   Nutrition Goals Discharge (Final Nutrition Goals Re-Evaluation):   Psychosocial: Target Goals: Acknowledge presence or absence of significant depression and/or stress, maximize coping skills, provide positive support system. Participant is able to verbalize types and ability to use techniques and skills needed for reducing stress and depression.   Education: Stress, Anxiety, and Depression - Group verbal and visual presentation to define topics covered.  Reviews how body is impacted by stress, anxiety, and depression.  Also discusses  healthy ways to reduce stress and to treat/manage anxiety and depression. Written material provided at class time.   Education: Sleep Hygiene -Provides group verbal and written instruction about how sleep can affect your health.  Define sleep hygiene, discuss sleep cycles and impact of sleep habits. Review good sleep hygiene tips.   Initial Review & Psychosocial Screening:  Initial Psych Review & Screening - 06/26/24 0957       Initial Review   Current issues with None Identified      Family Dynamics   Good Support System? 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 Tim stated that he does not have any stressors and has a good support system of his wife and family. Patient stated he particularly enjoys his time with his 10 grandchildren.Tim was pleasant to speak with and stated that he enjoys yard work and remains  active. The patient stated he does have back pain/issues, but other than that manages well with his physical and mental health.      Barriers   Psychosocial barriers to participate in program The patient should benefit from training in stress management and relaxation.      Screening Interventions   Interventions To provide support and resources with identified psychosocial needs;Provide feedback about the scores to participant;Encouraged to exercise    Expected Outcomes Short Term goal: Identification and review with participant of any Quality of Life or Depression concerns found by scoring the questionnaire.;Long Term goal: The participant improves quality of Life and PHQ9 Scores as seen by post scores and/or verbalization of changes          Quality of Life Scores:   Quality of Life - 06/27/24 1017       Quality of Life   Select Quality of Life      Quality of Life Scores   Health/Function Pre 12.73 %    Socioeconomic Pre 12.69 %    Psych/Spiritual Pre 12.79 %    Family Pre 13.1 %    GLOBAL Pre 12.79 %         Scores of 19 and below usually indicate a poorer quality of life in these areas.  A difference of  2-3 points is a clinically meaningful difference.  A difference of 2-3 points in the total score of the Quality of Life Index has been associated with significant improvement in overall quality of life, self-image, physical symptoms, and general health in studies assessing change in quality of life.  PHQ-9: Review Flowsheet  More data exists      06/27/2024 02/12/2024 10/05/2023 09/15/2023 09/01/2023  Depression screen PHQ 2/9  Decreased Interest 0 0 0 0 0  Down, Depressed, Hopeless 0 0 0 0 0  PHQ - 2 Score 0 0 0 0 0  Altered sleeping 0 0 0 0 0  Tired, decreased energy 0 1 0 0 0  Change in appetite 0 0 0 0 0  Feeling bad or failure about yourself  0 0 0 0 0  Trouble concentrating 0 0 0 0 0  Moving slowly or fidgety/restless 0 0 0 0 0  Suicidal thoughts 0 0 0 0 0   PHQ-9 Score 0 1 0 0 0  Difficult doing work/chores Not difficult at all Not difficult at all Not difficult at all Not difficult at all Not difficult at all   Interpretation of Total Score  Total Score Depression Severity:  1-4 = Minimal  depression, 5-9 = Mild depression, 10-14 = Moderate depression, 15-19 = Moderately severe depression, 20-27 = Severe depression   Psychosocial Evaluation and Intervention:  Psychosocial Evaluation - 06/26/24 0958       Psychosocial Evaluation & Interventions   Interventions Stress management education;Relaxation education;Encouraged to exercise with the program and follow exercise prescription    Comments 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 Tim stated that he does not have any stressors and has a good support system of his wife and family. Patient stated he particularly enjoys his time with his 10 grandchildren.Tim was pleasant to speak with and stated that he enjoys yard work and remains active. The patient stated he does have back pain/issues, but other than that manages well with his physical and mental health.    Expected Outcomes ST: Attend cardiac rehab for education and exercise; LT: Develop and maintain positive self-care habits    Continue Psychosocial Services  Follow up required by staff          Psychosocial Re-Evaluation:   Psychosocial Discharge (Final Psychosocial Re-Evaluation):   Vocational Rehabilitation: Provide vocational rehab assistance to qualifying candidates.   Vocational Rehab Evaluation & Intervention:  Vocational Rehab - 06/26/24 0957       Initial Vocational Rehab Evaluation & Intervention   Assessment shows need for Vocational Rehabilitation No          Education: Education Goals: Education classes will be provided on a variety of topics geared toward better understanding of heart health and risk factor modification. Participant will state  understanding/return demonstration of topics presented as noted by education test scores.  Learning Barriers/Preferences:  Learning Barriers/Preferences - 06/26/24 0957       Learning Barriers/Preferences   Learning Barriers None    Learning Preferences Group Instruction;Individual Instruction;Verbal Instruction;Video;Written Material          General Cardiac Education Topics:  AED/CPR: - Group verbal and written instruction with the use of models to demonstrate the basic use of the AED with the basic ABC's of resuscitation.   Test and Procedures: - Group verbal and visual presentation and models provide information about basic cardiac anatomy and function. Reviews the testing methods done to diagnose heart disease and the outcomes of the test results. Describes the treatment choices: Medical Management, Angioplasty, or Coronary Bypass Surgery for treating various heart conditions including Myocardial Infarction, Angina, Valve Disease, and Cardiac Arrhythmias. Written material provided at class time.   Medication Safety: - Group verbal and visual instruction to review commonly prescribed medications for heart and lung disease. Reviews the medication, class of the drug, and side effects. Includes the steps to properly store meds and maintain the prescription regimen. Written material provided at class time.   Intimacy: - Group verbal instruction through game format to discuss how heart and lung disease can affect sexual intimacy. Written material provided at class time.   Know Your Numbers and Heart Failure: - Group verbal and visual instruction to discuss disease risk factors for cardiac and pulmonary disease and treatment options.  Reviews associated critical values for Overweight/Obesity, Hypertension, Cholesterol, and Diabetes.  Discusses basics of heart failure: signs/symptoms and treatments.  Introduces Heart Failure Zone chart for action plan for heart failure. Written material  provided at class time.   Infection Prevention: - Provides verbal and written material to individual with discussion of infection control including proper hand washing and proper equipment cleaning during exercise session. Flowsheet Row Cardiac Rehab from  06/27/2024 in [facility] Cardiac and Pulmonary Rehab  Date 06/27/24  Educator MB  Instruction Review Code 1- Verbalizes Understanding    Falls Prevention: - Provides verbal and written material to individual with discussion of falls prevention and safety. Flowsheet Row Cardiac Rehab from 06/27/2024 in [facility] Cardiac and Pulmonary Rehab  Date 06/27/24  Educator MB  Instruction Review Code 1- Verbalizes Understanding    Other: -Provides group and verbal instruction on various topics (see comments) Flowsheet Row Cardiac Rehab from 06/27/2024 in [facility] Cardiac and Pulmonary Rehab  Date 06/27/24  [facility] and Cardiac Procedures]    Knowledge Questionnaire Score:  Knowledge Questionnaire Score - 06/27/24 1018       Knowledge Questionnaire Score   Pre Score 23/26          Core Components/Risk Factors/Patient Goals at Admission:  Personal Goals and Risk Factors at Admission - 06/27/24 1019       Core Components/Risk Factors/Patient Goals on Admission    Weight Management Weight Loss;Yes    Intervention Weight Management: Develop a combined nutrition and exercise program designed to reach desired caloric intake, while maintaining appropriate intake of nutrient and fiber, sodium and fats, and appropriate energy expenditure required for the weight goal.;Weight Management: Provide education and appropriate resources to help participant work on and attain dietary goals.;Weight Management/Obesity: Establish reasonable short term and long term weight goals.    Admit Weight 240 lb 8 oz (109.1 kg)    Goal Weight: Short Term 230 lb (104.3 kg)    Goal Weight: Long Term 220 lb (99.8 kg)    Expected Outcomes Short Term: Continue to assess and modify  interventions until short term weight is achieved;Long Term: Adherence to nutrition and physical activity/exercise program aimed toward attainment of established weight goal;Weight Loss: Understanding of general recommendations for a balanced deficit meal plan, which promotes 1-2 lb weight loss per week and includes a negative energy balance of [phone] kcal/d;Understanding recommendations for meals to include 15-35% energy as protein, 25-35% energy from fat, 35-60% energy from carbohydrates, less than 200mg  of dietary cholesterol, 20-35 gm of total fiber daily;Understanding of distribution of calorie intake throughout the day with the consumption of 4-5 meals/snacks    Hypertension Yes    Intervention Provide education on lifestyle modifcations including regular physical activity/exercise, weight management, moderate sodium restriction and increased consumption of fresh fruit, vegetables, and low fat dairy, alcohol moderation, and smoking cessation.;Monitor prescription use compliance.    Expected Outcomes Short Term: Continued assessment and intervention until BP is < 140/67mm HG in hypertensive participants. < 130/66mm HG in hypertensive participants with diabetes, heart failure or chronic kidney disease.;Long Term: Maintenance of blood pressure at goal levels.    Lipids Yes    Intervention Provide education and support for participant on nutrition & aerobic/resistive exercise along with prescribed medications to achieve LDL 70mg , HDL >40mg .    Expected Outcomes Short Term: Participant states understanding of desired cholesterol values and is compliant with medications prescribed. Participant is following exercise prescription and nutrition guidelines.;Long Term: Cholesterol controlled with medications as prescribed, with individualized exercise RX and with personalized nutrition plan. Value goals: LDL < 70mg , HDL > 40 mg.          Education:Diabetes - Individual verbal and written instruction to  review signs/symptoms of diabetes, desired ranges of glucose level fasting, after meals and with exercise. Acknowledge that pre and post exercise glucose checks will be done for 3 sessions at entry of program.   Core Components/Risk Factors/Patient Goals Review:  Core Components/Risk Factors/Patient Goals at Discharge (Final Review):    ITP Comments:  ITP Comments     Row Name 06/26/24 1001 06/27/24 1006 07/04/24 0910 07/17/24 1001 08/14/24 0825   ITP Comments Initial phone call completed. Diagnosis can be found in [facility] 06/12/2024. EP Orientation scheduled for Thursday, June 27, 2024, @ 0800. Completed and gym orientation for cardiac rehab. Initial ITP created and sent for review to Dr. Oneil Pinal, Medical Director. First full day of exercise!  Patient was oriented to gym and equipment including functions, settings, policies, and procedures.  Patient's individual exercise prescription and treatment plan were reviewed.  All starting workloads were established based on the results of the 6 minute walk test done at initial orientation visit.  The plan for exercise progression was also introduced and progression will be customized based on patient's performance and goals. 30 Day review completed. Medical Director ITP review done, changes made as directed, and signed approval by Medical Director. New to program. 30 Day review completed. Medical Director ITP review done, changes made as directed, and signed approval by Medical Director.      Comments: 30 day review

## 2024-08-15 ENCOUNTER — Encounter

## 2024-08-15 DIAGNOSIS — Z7901 Long term (current) use of anticoagulants: Secondary | ICD-10-CM | POA: Diagnosis not present

## 2024-08-15 DIAGNOSIS — E782 Mixed hyperlipidemia: Secondary | ICD-10-CM | POA: Diagnosis not present

## 2024-08-15 DIAGNOSIS — R600 Localized edema: Secondary | ICD-10-CM | POA: Diagnosis not present

## 2024-08-15 DIAGNOSIS — I480 Paroxysmal atrial fibrillation: Secondary | ICD-10-CM | POA: Diagnosis not present

## 2024-08-15 DIAGNOSIS — R5383 Other fatigue: Secondary | ICD-10-CM | POA: Diagnosis not present

## 2024-08-15 DIAGNOSIS — I2510 Atherosclerotic heart disease of native coronary artery without angina pectoris: Secondary | ICD-10-CM | POA: Diagnosis not present

## 2024-08-15 DIAGNOSIS — I10 Essential (primary) hypertension: Secondary | ICD-10-CM | POA: Diagnosis not present

## 2024-08-15 DIAGNOSIS — I4891 Unspecified atrial fibrillation: Secondary | ICD-10-CM | POA: Diagnosis not present

## 2024-08-15 MED ORDER — SODIUM CHLORIDE 0.9 % IV SOLN: INTRAVENOUS | Status: DC

## 2024-08-15 NOTE — Progress Notes (Signed)
 Established Patient Visit   Chief Complaint: Chief Complaint  Patient presents with  . Atrial Fibrillation   Date of Service: 08/15/2024 Date of Birth: 1956-01-10 PCP: Salli Doretta Large, MD  History of Present Illness: [name] is a 68 y.o.male patient who presents for a follow up. PMH significant for PAF, hypertension, hyperlipidemia, valvular regurgitation, dilated aorta, PBH, hx of peripheral and pulmonary edema, hypokalemia, CAD.  Today, pt presents with recent a-fib while he was on vacation. He has history of a-fib. Patient states that he is usually in a-fib for around 4-5 days before it goes away. Endorses having fatigue. Denies having any chest pains. He has an ablation scheduled for November 6. Start amiodarone. Patient would like to have cardioversion. We will see if it is possible to have a cardioversion tomorrow.  Patient gives a history of irregular heartbeat persistent atrial fibrillation over the last 3 weeks she has had episodes where he spontaneous converted to sinus rhythm but more recently he has been in A-fib relatively persistent over the last 3 weeks patient still on Eliquis  for anticoagulation and diltiazem .  Patient scheduled for ablation with Dr. Ezzard at [facility] November 6 he had been scheduled for cardioversion but had spontaneously converted but now has gone back in atrial fibrillation patient is maintained on diltiazem  180 and metoprolol  25 twice a day  Patient complains of palpitation tachycardia generalized fatigue weakness since being in persistent atrial fibrillation   Visit Summaries: 0820/2025 Patient was seen by me for a follow up. Follow ups can be done at [facility] location. Refer to EP to further discuss ablation option. Start cardiac rehab.  Past Medical and Surgical History  Past Medical History Past Medical History:  Diagnosis Date  . HTN (hypertension)   . Kidney stones   . Osteoarthrosis, unspecified whether generalized or localized, lower leg   .  Pure hypercholesterolemia     Past Surgical History He has a past surgical history that includes hernia repair - unknown type (1990); knee surgery; Colonoscopy (02/07/2003); Colonoscopy (06/13/2014); back surgery (01/2019; 02/2019;05/2019; 12/2020); Colonoscopy (11/08/2019); ureter surgery (06/18/2021); Colon @ [facility] (05/22/2023); Replacement total knee (Left, 2005); and other surgery (05/13/2024).   Medications and Allergies  Current Medications  Current Outpatient Medications  Medication Sig Dispense Refill  . apixaban  (ELIQUIS ) 5 mg tablet Take 1 tablet (5 mg total) by mouth 2 (two) times daily 180 tablet 3  . celecoxib  (CELEBREX ) 200 MG capsule Take 200 mg by mouth once daily    . chlorproMAZINE  (THORAZINE ) 25 MG tablet Take 25 mg by mouth 3 (three) times daily    . clopidogreL (PLAVIX) 75 mg tablet Take 75 mg by mouth once daily    . dilTIAZem  (CARDIZEM  CD) 180 MG CD capsule Take 1 capsule (180 mg total) by mouth once daily 90 capsule 3  . ezetimibe (ZETIA) 10 mg tablet Take 1 tablet (10 mg total) by mouth once daily 90 tablet 3  . ferrous sulfate  325 (65 FE) MG tablet Take 1 tablet (325 mg total) by mouth daily with breakfast    . fexofenadine  (ALLEGRA ) 180 MG tablet Take 180 mg by mouth once daily    . fluticasone  (FLONASE ) 50 mcg/actuation nasal spray Place 1 spray into both nostrils as needed    . methocarbamoL  (ROBAXIN ) 500 MG tablet Take 500 mg by mouth 3 (three) times daily as needed    . metoprolol  SUCCinate (TOPROL -XL) 50 MG XL tablet Take 1 tablet (50 mg total) by mouth 2 (two) times daily 180 tablet  0  . montelukast  (SINGULAIR ) 10 mg tablet Take 1 tablet (10 mg total) by mouth at bedtime 90 tablet 1  . multivitamin,tx-iron-minerals Tab Take 1 tablet by mouth once daily       . nitroGLYcerin (NITROSTAT) 0.4 MG SL tablet Place 0.4 mg under the tongue every 5 (five) minutes as needed for Chest pain    . pantoprazole  (PROTONIX ) 40 MG DR tablet Take 40 mg by mouth once daily    .  potassium chloride  (K-DUR,KLOR-CON ) 20 MEQ ER tablet Take 20 mEq by mouth 3 (three) times daily       . rosuvastatin  (CRESTOR ) 5 MG tablet Take 5 mg by mouth 3 (three) times a week    . tadalafiL  (CIALIS ) 10 MG tablet Take 1 tablet by mouth as needed    . tamsulosin  (FLOMAX ) 0.4 mg capsule Take 1 capsule by mouth once daily    . traMADoL  (ULTRAM ) 50 mg tablet Take 50 mg by mouth every 6 (six) hours as needed    . triamterene-hydroCHLOROthiazide  (MAXZIDE-25) 37.5-25 mg tablet Take 1/2 (one-half) tablet by mouth once daily 15 tablet 6  . valsartan  (DIOVAN ) 320 MG tablet Take 0.5 tablets (160 mg total) by mouth once daily    . AMIOdarone (PACERONE) 200 MG tablet Take 2 tablets (400 mg total) by mouth 2 (two) times daily for 14 days, THEN 1 tablet (200 mg total) once daily for 30 days. 86 tablet 0  . isosorbide mononitrate (IMDUR) 30 MG ER tablet Take 30 mg by mouth once daily (Patient not taking: Reported on 08/15/2024)     No current facility-administered medications for this visit.    Allergies: Mobic [meloxicam], Spironolactone , and Sulfa  (sulfonamide antibiotics)  Social and Family History  Social History  reports that he has never smoked. His smokeless tobacco use includes chew. He reports current alcohol use. He reports that he does not use drugs.  Family History Family History  Problem Relation Name Age of Onset  . Bladder Cancer Mother    . High blood pressure (Hypertension) Mother    . Macular degeneration Mother    . Cancer Father    . Myocardial Infarction (Heart attack) Father    . High blood pressure (Hypertension) Father    . Myocardial Infarction (Heart attack) Brother  30  . Bladder Cancer Brother    . Colon cancer Brother    . High blood pressure (Hypertension) Other         multiple family members  . Stroke Other         grandparents    Review of Systems   Pertinent positives and negatives are mentioned above in HPI and all other systems are negative.  Physical  Examination   Vitals:BP (!) 142/98   Pulse (!) 126   Ht 185.4 cm (6' 1)   Wt (!) 112 kg (247 lb)   SpO2 97%   BMI 32.59 kg/m  Ht:185.4 cm (6' 1) Wt:(!) 112 kg (247 lb) ADJ:Anib surface area is 2.4 meters squared. Body mass index is 32.59 kg/m.  HEENT: Pupils equally reactive to light and accomodation  Neck: Supple without thyromegaly, carotid pulses 2+ Lungs: clear to auscultation bilaterally; no wheezes, rales, rhonchi Heart: Irregular irregular  rate and rhythm.  No gallops, murmurs or rub Abdomen: soft nontender, nondistended, with normal bowel sounds Extremities: no cyanosis, clubbing, or edema Peripheral Pulses: 2+ in all extremities, 2+ femoral pulses bilaterally Neurologic: Alert and oriented X3; speech intact; face symmetrical; moves all extremities well  Cardiovascular  Studies:    Echocardiogram 2D complete: 04/11/2024 Impression   1.  Left ventricle: The cavity size is normal. Systolic function is normal. The estimated ejection fraction is 50-55%. Wall motion is normal; there are no regional wall motion abnormalities. Normal diastolic function. 2.  Aortic valve: There is mild thickening, consistent with sclerosis. 3.  Systemic arteries: The ascending aorta diameter is 4.1 cm. 4.  Ascending aorta: The vessel is mildly dilated. 5.  Mitral valve: Mild-moderate mitral annular calcification is noted. 6.  Right ventricle: The cavity size is normal. Systolic function is normal.   11/27/2023 CONCLUSION ------------------------------------------------------------------------------- NORMAL LEFT VENTRICULAR SYSTOLIC FUNCTION WITH NO LVH ESTIMATED EF: >55%, CALC EF(2D): 55% NORMAL LA PRESSURES WITH NORMAL DIASTOLIC FUNCTION NORMAL RIGHT VENTRICULAR SYSTOLIC FUNCTION VALVULAR REGURGITATION: TRIVIAL AR, MILD MR, MILD PR, TRIVIAL TR NO VALVULAR STENOSIS ------------------------------------------------------------------------------------------ NO SIGNIFICANT CHANGE SINCE  PREVIOUS ECHO EXAM Compared with prior Echo study on 08/25/2022   08/25/2022 NORMAL LEFT VENTRICULAR SYSTOLIC FUNCTION   WITH MILD LVH NORMAL RIGHT VENTRICULAR SYSTOLIC FUNCTION MILD VALVULAR REGURGITATION (See above) NO VALVULAR STENOSIS IRREGULAR HEART RHYTHM CAPTURED THROUGHOUT EXAM ESTIMATED LVEF >55% (CALCULATED: 60.5%) GLS: -22.4% Aortic: TRIVIAL AI AOV: MILDLY THICKENED, FULLY MOBILE LEAFLETS; SCLEROTIC AoV WITH NO STENOSIS Mitral: MILD MR Tricuspid: TRIVIAL TR Pulmonic: MILD PI MODERATELY DILATED ASCENDING AORTA MEASURING 4.0cm MILDLY DILATED AORTIC ROOT MEASURING 3.9cm MILD LAE   NM Myocardial Perfusion SPECT multiple (stress and rest):   Cardiac Catheterization: 05/13/2024 SUMMARY:  Severe calcified CAD of LAD as described previously inlcuding 95% mid LAD  stenosis.  Normal LVEDP of 10 mmhg. No AS by pullback.  Atherectomy of LAD using 1.25 mm rota burr  IVL using 2.5 mm shockwave  Successful IVUS guided PCI to the mid LAD with placement of a 2.5 x 34 mm  Onyx drug-eluting stent with excellent angiographic result TIMI-3 flow.      Stress Test: 03/16/2017 Conclusions Normal treadmill ECG without evidence of ischemia or arrhythmia. I reviewed and concur with this report. Electronically signed ab:[email or web address] MD, WOLM [phone]) on 03/16/2017 12:45:26 PM   Holter:   Cardiac CT Scan: 03/06/2024 IMPRESSION AND RECOMMENDATION:  1. Coronary calcium  score of 2853. This was 97th percentile for age  and sex matched control.   2. CAC >300 in LAD, LCx, RCA. CAC-DRS A3/N3.   3. Recommend aspirin  and statin if no contraindication.   4. Recommend cardiology consultation.   5. Continue heart healthy lifestyle and risk factor modification.     Cardiac MRI:   Assessment   68 y.o. male with  1. Coronary artery disease involving native coronary artery of native heart without angina pectoris   2. Atrial fibrillation, unspecified type (CMS/HHS-HCC)   3. Long term current  use of anticoagulant therapy   4. Benign essential hypertension   5. Hyperlipidemia, mixed   6. Bilateral lower extremity edema   7. Paroxysmal atrial fibrillation (CMS/HHS-HCC)    Plan   Severe coronary disease complex PCI and stent at [facility] by Dr. Lawyer in the past patient denies any chest pain or angina now with atrial fibrillation on Eliquis  and Plavix  stent, continue eliquis , plavix, zetia, imdur, nitroglycerin as needed for chest pain, crestor , tramadol , valsartan , triamterene-HCTZ, diltiazem , increase metoprolol ,  Paroxysmal atrial fibrillation now has persistent A-fib for the last 3 weeks spontaneously converted to sinus rhythm prior to the last attempt at cardioversion hours persistent in atrial fibrillation now for follow-up evaluation PAF, continue eliquis , diltiazem , diltiazem , start amiodarone, increase metoprolol , has ablation scheduled November 6.  Will  proceed with cardioversion within 24 to 48 hours if possible Hypertension, today's BP was 142/98, continue valsartan , triamterene-HCTZ, increase metoprolol  blood pressure goal 130/80 continue current therapy Hyperlipidemia, continue crestor  and zetia for lipid management, has trouble tolerating statins  Edema, recommend support stockings, BLE elevation, and reducing sodium intake Generalized fatigue probably related to atrial fibrillation generalized weakness lack of energy thinks to be related to the persistent A-fib Recommend cardioversion prior to ablation         No follow-ups on file.  This note is partially written by [name], in the presence of and acting as the scribe of Dr. Cara York.      [name]  I have reviewed, edited and added to the note to reflect my best personal medical judgment.  Attestation Statement:   I personally performed the service. (TP)  Justin JONETTA LOVELACE, MD  [facility] [place], [place] Ph:  [phone] Fax:  [phone] This note was generated in part with voice recognition software, Dragon.  I apologize for any typographical errors that were not detected and corrected from this process.  They are unintentional.

## 2024-08-16 ENCOUNTER — Ambulatory Visit: Admitting: Anesthesiology

## 2024-08-16 ENCOUNTER — Ambulatory Visit
Admission: RE | Admit: 2024-08-16 | Discharge: 2024-08-16 | Disposition: A | Discharge status: Home / Self Care | Attending: Internal Medicine | Admitting: Internal Medicine

## 2024-08-16 ENCOUNTER — Other Ambulatory Visit: Payer: Self-pay

## 2024-08-16 ENCOUNTER — Encounter: Payer: Self-pay | Admitting: Internal Medicine

## 2024-08-16 ENCOUNTER — Encounter
Admission: RE | Disposition: A | Discharge status: Home / Self Care | Payer: Self-pay | Source: Home / Self Care | Attending: Internal Medicine

## 2024-08-16 DIAGNOSIS — I4891 Unspecified atrial fibrillation: Secondary | ICD-10-CM | POA: Diagnosis not present

## 2024-08-16 DIAGNOSIS — Z87891 Personal history of nicotine dependence: Secondary | ICD-10-CM | POA: Diagnosis not present

## 2024-08-16 DIAGNOSIS — K219 Gastro-esophageal reflux disease without esophagitis: Secondary | ICD-10-CM | POA: Diagnosis not present

## 2024-08-16 DIAGNOSIS — I2510 Atherosclerotic heart disease of native coronary artery without angina pectoris: Secondary | ICD-10-CM | POA: Diagnosis not present

## 2024-08-16 DIAGNOSIS — I10 Essential (primary) hypertension: Secondary | ICD-10-CM | POA: Diagnosis not present

## 2024-08-16 HISTORY — PX: CARDIOVERSION: SHX1299

## 2024-08-16 SURGERY — CARDIOVERSION
Anesthesia: General

## 2024-08-16 MED ORDER — PHENYLEPHRINE 80 MCG/ML (10ML) SYRINGE FOR IV PUSH (FOR BLOOD PRESSURE SUPPORT)
PREFILLED_SYRINGE | INTRAVENOUS | Status: AC
Start: 2024-08-16 — End: 2024-08-16
  Filled 2024-08-16: qty 10

## 2024-08-16 MED ORDER — SUCCINYLCHOLINE CHLORIDE 200 MG/10ML IV SOSY
PREFILLED_SYRINGE | INTRAVENOUS | Status: AC
  Filled 2024-08-16: qty 10

## 2024-08-16 MED ORDER — LIDOCAINE HCL (PF) 2 % IJ SOLN
INTRAMUSCULAR | Status: DC | PRN
  Administered 2024-08-16: 100 mg via INTRADERMAL

## 2024-08-16 MED ORDER — PROPOFOL 10 MG/ML IV BOLUS
INTRAVENOUS | Status: DC | PRN
  Administered 2024-08-16: 80 mg via INTRAVENOUS

## 2024-08-16 MED ORDER — LIDOCAINE HCL (PF) 2 % IJ SOLN
INTRAMUSCULAR | Status: AC
Start: 2024-08-16 — End: 2024-08-16
  Filled 2024-08-16: qty 5

## 2024-08-16 MED ORDER — PROPOFOL 10 MG/ML IV BOLUS
INTRAVENOUS | Status: AC
  Filled 2024-08-16: qty 20

## 2024-08-16 MED ORDER — EPHEDRINE 5 MG/ML INJ
INTRAVENOUS | Status: AC
Start: 2024-08-16 — End: 2024-08-16
  Filled 2024-08-16: qty 5

## 2024-08-16 NOTE — Anesthesia Procedure Notes (Signed)
 Procedure Name: MAC Date/Time: 08/16/2024 12:51 PM  Performed by: Lorrene Camelia LABOR, CRNAPre-anesthesia Checklist: Patient identified, Emergency Drugs available, Suction available and Patient being monitored Patient Re-evaluated:Patient Re-evaluated prior to induction Oxygen Delivery Method: Nasal cannula Preoxygenation: Pre-oxygenation with 100% oxygen Induction Type: IV induction

## 2024-08-16 NOTE — Transfer of Care (Signed)
 Immediate Anesthesia Transfer of Care Note  Patient: [name]  Procedure(s) Performed: CARDIOVERSION  Patient Location: Cath Lab  Anesthesia Type:General  Level of Consciousness: drowsy and patient cooperative  Airway & Oxygen Therapy: Patient Spontanous Breathing and Patient connected to nasal cannula oxygen  Post-op Assessment: Report given to RN and Post -op Vital signs reviewed and stable  Post vital signs: Reviewed and stable  Last Vitals:  Vitals Value Taken Time  BP 160/94 08/16/24 13:11  Temp 36.2 C 08/16/24 13:11  Pulse 55 08/16/24 13:11  Resp 15 08/16/24 13:11  SpO2 97 % 08/16/24 13:11    Last Pain:  Vitals:   08/16/24 1311  TempSrc: Tympanic  PainSc:          Complications: No notable events documented.

## 2024-08-16 NOTE — Anesthesia Preprocedure Evaluation (Addendum)
 Anesthesia Evaluation  Patient identified by MRN, date of birth, ID band Patient awake    Reviewed: Allergy & Precautions, H&P , NPO status , Patient's Chart, lab work & pertinent test results  Airway Mallampati: III  TM Distance: >3 FB Neck ROM: full    Dental no notable dental hx.    Pulmonary neg pulmonary ROS   Pulmonary exam normal        Cardiovascular hypertension, + CAD and + Cardiac Stents  + dysrhythmias Atrial Fibrillation  Rhythm:Irregular Rate:Normal  LHC PCI 7/25: SUMMARY:  Severe calcified CAD of LAD as described previously inlcuding 95% mid LAD  stenosis.  Normal LVEDP of 10 mmhg. No AS by pullback.  Atherectomy of LAD using 1.25 mm rota burr  IVL using 2.5 mm shockwave  Successful IVUS guided PCI to the mid LAD with placement of a 2.5 x 34 mm  Onyx drug-eluting stent with excellent angiographic result TIMI-3 flow.   ECHO 6/2%: 1.  Left ventricle: The cavity size is normal. Systolic function is normal. The estimated ejection fraction is 50-55%. Wall motion is normal; there are no regional wall motion abnormalities. Normal diastolic function.  2.  Aortic valve: There is mild thickening, consistent with sclerosis.  3.  Systemic arteries: The ascending aorta diameter is 4.1 cm.  4.  Ascending aorta: The vessel is mildly dilated.  5.  Mitral valve: Mild-moderate mitral annular calcification is noted.  6.  Right ventricle: The cavity size is normal. Systolic function is normal     Neuro/Psych  Neuromuscular disease  negative psych ROS   GI/Hepatic Neg liver ROS,GERD  ,,  Endo/Other  negative endocrine ROS    Renal/GU negative Renal ROS  negative genitourinary   Musculoskeletal   Abdominal  (+) + obese  Peds  Hematology negative hematology ROS (+)   Anesthesia Other Findings hx of peripheral and pulmonary edema, hypokalemia, CAD.  Past Medical History: No date: Acquired deafness, right      Comment:  per pt completed deafness right ear since age 75 No date: Allergic rhinitis, seasonal No date: Anticoagulant long-term use     Comment:  eliquis --- managed by cardiology No date: BPH associated with nocturia     Comment:  urologist--- dr alvaro No date: Cancer [facility])     Comment:  basal and squamous cell No date: Chronic low back pain No date: DDD (degenerative disc disease), lumbosacral No date: Foraminal stenosis of lumbosacral region No date: GERD (gastroesophageal reflux disease) No date: Heart murmur No date: History of kidney stones No date: History of skin cancer No date: Hydronephrosis, left No date: Hyperlipidemia, mixed No date: Hypertension, essential     Comment:  followed by pcp and cardiology No date: Hypokalemia No date: OA (osteoarthritis) 01/28/2017: Paroxysmal atrial fibrillation (HCC)     Comment:  cardiologist--- dr hester;   pt had ETT 03-16-2017 per              cardio note normal, event monitor 02-09-2017 showed               baseline SR some peroids  Afib with controlled rate with               occ PAC/ PVC;  per pt had echo many yrs ago prior to AFib  Past Surgical History: 01/18/2021: ABDOMINAL EXPOSURE; N/A     Comment:  Procedure: ABDOMINAL EXPOSURE;  Surgeon: Gretta Lonni PARAS, MD;  Location: [facility] OR;  Service: Vascular;               Laterality: N/A; 01/18/2021: ANTERIOR LUMBAR FUSION; N/A     Comment:  Procedure: Anterior Lumbar Interbody Fusion - Lumbar               Five-Sarcal One;  Surgeon: Onetha Kuba, MD;  Location: [facility]               OR;  Service: Neurosurgery;  Laterality: N/A;  Anterior               Lumbar Interbody Fusion - Lumbar Five-Sacral One 06/18/2021: CYSTOSCOPY W/ URETERAL STENT PLACEMENT; Left     Comment:  Procedure: CYSTOSCOPY WITH RETROGRADE PYELOGRAM/URETERAL              STENT EXCHANGE;  Surgeon: Alvaro Hummer, MD;  Location:              [facility] ORS;  Service: Urology;  Laterality: Left; 04/21/2021:  CYSTOSCOPY WITH RETROGRADE PYELOGRAM, URETEROSCOPY AND  STENT PLACEMENT; Left     Comment:  Procedure: CYSTOSCOPY WITH RETROGRADE PYELOGRAM,               DIAGNOSTICURETEROSCOPY AND STENT PLACEMENT;  Surgeon:               Alvaro Hummer, MD;  Location: [facility];  Service: Urology;  Laterality: Left;  1 HR No date: EXTRACORPOREAL SHOCK WAVE LITHOTRIPSY     Comment:  approx 2002 2018: LUMBAR DISC SURGERY 08/31/2022: LUMBAR LAMINECTOMY/DECOMPRESSION MICRODISCECTOMY; Right     Comment:  Procedure: Redo foraminotomy right Lumbar four-five;                Surgeon: Onetha Kuba, MD;  [facility];  Service:               Neurosurgery;  Laterality: Right; 2018: REPAIR DURAL / CSF LEAK     Comment:  post op lumbar diskectomy 2015: REVISION TOTAL KNEE ARTHROPLASTY; Left 2012: TOTAL KNEE ARTHROPLASTY; Left 06/12/2019: TRANSFORAMINAL LUMBAR INTERBODY FUSION (TLIF) WITH  PEDICLE SCREW FIXATION 1 LEVEL; N/A     Comment:  Procedure: LUMBAR FOUR-FIVE TRANSFORAMINAL LUMBAR               INTERBODY FUSION (TLIF) WITH PEDICLE SCREW FIXATION;                Surgeon: Onetha Kuba, MD;  Location: [facility] OR;  Service:               Neurosurgery;  Laterality: N/A; 1992: UMBILICAL HERNIA REPAIR  BMI    Body Mass Index: 31.88 kg/m      Reproductive/Obstetrics negative OB ROS                              Anesthesia Physical Anesthesia Plan  ASA: 3  Anesthesia Plan: General   Post-op Pain Management: Minimal or no pain anticipated   Induction: Intravenous  PONV Risk Score and Plan: Propofol  infusion and TIVA  Airway Management Planned: Natural Airway  Additional Equipment:   Intra-op Plan:   Post-operative Plan:   Informed Consent: I have reviewed the patients History and Physical, chart, labs and discussed the procedure including the risks, benefits and alternatives for the proposed anesthesia with the patient or authorized representative  who has indicated his/her understanding and acceptance.  Dental Advisory Given  Plan Discussed with: CRNA and Surgeon  Anesthesia Plan Comments:          Anesthesia Quick Evaluation

## 2024-08-16 NOTE — Anesthesia Postprocedure Evaluation (Signed)
 Anesthesia Post Note  Patient: [name]  Procedure(s) Performed: CARDIOVERSION  Patient location during evaluation: PACU Anesthesia Type: General Level of consciousness: awake and alert Pain management: pain level controlled Vital Signs Assessment: post-procedure vital signs reviewed and stable Respiratory status: spontaneous breathing, nonlabored ventilation and respiratory function stable Cardiovascular status: blood pressure returned to baseline and stable Postop Assessment: no apparent nausea or vomiting Anesthetic complications: no   No notable events documented.   Last Vitals:  Vitals:   08/16/24 1330 08/16/24 1345  BP: (!) 144/103 (!) 139/100  Pulse: (!) 58 (!) 57  Resp: 16 18  Temp:  (!) 36.1 C  SpO2: 93% 95%    Last Pain:  Vitals:   08/16/24 1345  TempSrc: Temporal  PainSc: 0-No pain                 Camellia Merilee Louder

## 2024-08-17 NOTE — CV Procedure (Signed)
 Electrical Cardioversion Procedure Note   Procedure: Electrical Cardioversion Indications:  Atrial Fibrillation  Procedure Details Consent: Risks of procedure as well as the alternatives and risks of each were explained to the (patient/caregiver).  Consent for procedure obtained. Time Out: Verified patient identification, verified procedure, site/side was marked, verified correct patient position, special equipment/implants available, medications/allergies/relevent history reviewed, required imaging and test results available.  Performed  Patient placed on cardiac monitor, pulse oximetry, supplemental oxygen as necessary.  Sedation given: Anesthesia manage propofol  Pacer pads placed anterior and posterior chest.  Cardioverted 1 time(s).  Cardioverted at 200J.  Evaluation Findings: Post procedure EKG shows: NSR Complications: None Patient did tolerate procedure well.   Justin Lovelace MD Cardiology 08/16/24 1300

## 2024-08-19 ENCOUNTER — Encounter: Payer: Self-pay | Admitting: Internal Medicine

## 2024-08-20 ENCOUNTER — Encounter

## 2024-08-21 ENCOUNTER — Other Ambulatory Visit: Payer: Self-pay | Admitting: Neurosurgery

## 2024-08-21 DIAGNOSIS — S32009K Unspecified fracture of unspecified lumbar vertebra, subsequent encounter for fracture with nonunion: Secondary | ICD-10-CM

## 2024-08-22 ENCOUNTER — Encounter

## 2024-08-27 ENCOUNTER — Encounter

## 2024-08-28 DIAGNOSIS — I119 Hypertensive heart disease without heart failure: Secondary | ICD-10-CM | POA: Diagnosis not present

## 2024-08-28 DIAGNOSIS — I10 Essential (primary) hypertension: Secondary | ICD-10-CM | POA: Diagnosis not present

## 2024-08-28 DIAGNOSIS — E782 Mixed hyperlipidemia: Secondary | ICD-10-CM | POA: Diagnosis not present

## 2024-08-28 DIAGNOSIS — I77819 Aortic ectasia, unspecified site: Secondary | ICD-10-CM | POA: Diagnosis not present

## 2024-08-28 DIAGNOSIS — R590 Localized enlarged lymph nodes: Secondary | ICD-10-CM | POA: Diagnosis not present

## 2024-08-28 DIAGNOSIS — I4891 Unspecified atrial fibrillation: Secondary | ICD-10-CM | POA: Diagnosis not present

## 2024-08-28 DIAGNOSIS — Z7901 Long term (current) use of anticoagulants: Secondary | ICD-10-CM | POA: Diagnosis not present

## 2024-08-28 DIAGNOSIS — R918 Other nonspecific abnormal finding of lung field: Secondary | ICD-10-CM | POA: Diagnosis not present

## 2024-08-28 DIAGNOSIS — J811 Chronic pulmonary edema: Secondary | ICD-10-CM | POA: Diagnosis not present

## 2024-08-29 ENCOUNTER — Encounter

## 2024-08-29 DIAGNOSIS — J069 Acute upper respiratory infection, unspecified: Secondary | ICD-10-CM | POA: Diagnosis not present

## 2024-08-29 DIAGNOSIS — I2510 Atherosclerotic heart disease of native coronary artery without angina pectoris: Secondary | ICD-10-CM | POA: Diagnosis not present

## 2024-08-29 DIAGNOSIS — Z955 Presence of coronary angioplasty implant and graft: Secondary | ICD-10-CM | POA: Diagnosis not present

## 2024-08-29 DIAGNOSIS — I480 Paroxysmal atrial fibrillation: Secondary | ICD-10-CM | POA: Diagnosis not present

## 2024-08-29 DIAGNOSIS — I10 Essential (primary) hypertension: Secondary | ICD-10-CM | POA: Diagnosis not present

## 2024-08-29 DIAGNOSIS — Z01818 Encounter for other preprocedural examination: Secondary | ICD-10-CM | POA: Diagnosis not present

## 2024-08-29 DIAGNOSIS — Z79899 Other long term (current) drug therapy: Secondary | ICD-10-CM | POA: Diagnosis not present

## 2024-08-29 DIAGNOSIS — Z7982 Long term (current) use of aspirin: Secondary | ICD-10-CM | POA: Diagnosis not present

## 2024-08-29 DIAGNOSIS — F17220 Nicotine dependence, chewing tobacco, uncomplicated: Secondary | ICD-10-CM | POA: Diagnosis not present

## 2024-08-30 DIAGNOSIS — J069 Acute upper respiratory infection, unspecified: Secondary | ICD-10-CM | POA: Diagnosis not present

## 2024-09-03 ENCOUNTER — Encounter

## 2024-09-04 ENCOUNTER — Ambulatory Visit
Admission: RE | Admit: 2024-09-04 | Discharge: 2024-09-04 | Disposition: A | Discharge status: Home / Self Care | Source: Ambulatory Visit | Attending: Neurosurgery | Admitting: Neurosurgery

## 2024-09-04 DIAGNOSIS — R0602 Shortness of breath: Secondary | ICD-10-CM | POA: Diagnosis not present

## 2024-09-04 DIAGNOSIS — R051 Acute cough: Secondary | ICD-10-CM | POA: Diagnosis not present

## 2024-09-04 DIAGNOSIS — S32009K Unspecified fracture of unspecified lumbar vertebra, subsequent encounter for fracture with nonunion: Secondary | ICD-10-CM | POA: Diagnosis not present

## 2024-09-04 DIAGNOSIS — M48061 Spinal stenosis, lumbar region without neurogenic claudication: Secondary | ICD-10-CM | POA: Diagnosis not present

## 2024-09-04 DIAGNOSIS — I700 Atherosclerosis of aorta: Secondary | ICD-10-CM | POA: Diagnosis not present

## 2024-09-04 DIAGNOSIS — Z981 Arthrodesis status: Secondary | ICD-10-CM | POA: Diagnosis not present

## 2024-09-04 DIAGNOSIS — M5126 Other intervertebral disc displacement, lumbar region: Secondary | ICD-10-CM | POA: Diagnosis not present

## 2024-09-04 DIAGNOSIS — J0100 Acute maxillary sinusitis, unspecified: Secondary | ICD-10-CM | POA: Diagnosis not present

## 2024-09-05 ENCOUNTER — Encounter

## 2024-09-09 DIAGNOSIS — R600 Localized edema: Secondary | ICD-10-CM | POA: Diagnosis not present

## 2024-09-09 DIAGNOSIS — E782 Mixed hyperlipidemia: Secondary | ICD-10-CM | POA: Diagnosis not present

## 2024-09-09 DIAGNOSIS — I480 Paroxysmal atrial fibrillation: Secondary | ICD-10-CM | POA: Diagnosis not present

## 2024-09-09 DIAGNOSIS — R5383 Other fatigue: Secondary | ICD-10-CM | POA: Diagnosis not present

## 2024-09-09 DIAGNOSIS — I10 Essential (primary) hypertension: Secondary | ICD-10-CM | POA: Diagnosis not present

## 2024-09-09 DIAGNOSIS — I2510 Atherosclerotic heart disease of native coronary artery without angina pectoris: Secondary | ICD-10-CM | POA: Diagnosis not present

## 2024-09-09 DIAGNOSIS — Z7901 Long term (current) use of anticoagulants: Secondary | ICD-10-CM | POA: Diagnosis not present

## 2024-09-09 DIAGNOSIS — I4891 Unspecified atrial fibrillation: Secondary | ICD-10-CM | POA: Diagnosis not present

## 2024-09-10 ENCOUNTER — Encounter

## 2024-09-11 ENCOUNTER — Encounter: Payer: Self-pay | Admitting: *Deleted

## 2024-09-11 DIAGNOSIS — Z955 Presence of coronary angioplasty implant and graft: Secondary | ICD-10-CM

## 2024-09-11 NOTE — Progress Notes (Signed)
 Cardiac Individual Treatment Plan  Patient Details  Name: [name] MRN: [id number] Date of Birth: 14-Sep-1956 Referring Provider:   Flowsheet Row Cardiac Rehab from 06/27/2024 in [facility] Cardiac and Pulmonary Rehab  Referring Provider Florencio Kava, MD    Initial Encounter Date:  Flowsheet Row Cardiac Rehab from 06/27/2024 in [facility] Cardiac and Pulmonary Rehab  Date 06/27/24    Visit Diagnosis: Status post coronary artery stent placement  Patient's Home Medications on Admission:  Current Outpatient Medications:    acetaminophen  (TYLENOL ) 325 MG tablet, Take 650 mg by mouth every 6 (six) hours as needed for moderate pain (pain score 4-6)., Disp: , Rfl:    amiodarone (PACERONE) 200 MG tablet, Take 200 mg by mouth 2 (two) times daily., Disp: , Rfl:    apixaban  (ELIQUIS ) 5 MG TABS tablet, Take 1 tablet (5 mg total) by mouth 2 (two) times daily. Restart Wednesday, November 16 (Patient taking differently: Take 5 mg by mouth 2 (two) times daily.), Disp: 60 tablet, Rfl: 0   celecoxib  (CELEBREX ) 200 MG capsule, Take 200 mg by mouth daily. (Patient taking differently: Take 200 mg by mouth in the morning.), Disp: , Rfl:    clopidogrel (PLAVIX) 75 MG tablet, Take 75 mg by mouth in the morning., Disp: , Rfl:    diltiazem  (CARDIZEM  CD) 180 MG 24 hr capsule, Take 1 capsule (180 mg total) by mouth 2 (two) times daily. (Patient taking differently: Take 180 mg by mouth in the morning.), Disp: 180 capsule, Rfl: 1   ezetimibe (ZETIA) 10 MG tablet, Take 10 mg by mouth at bedtime., Disp: , Rfl:    fexofenadine  (ALLEGRA ) 180 MG tablet, Take 1 tablet (180 mg total) by mouth daily., Disp: 90 tablet, Rfl: 1   fluticasone  (FLONASE ) 50 MCG/ACT nasal spray, Place 2 sprays into both nostrils daily. (Patient taking differently: Place 2 sprays into both nostrils daily as needed for allergies or rhinitis.), Disp: 18 mL, Rfl: 5   methocarbamol  (ROBAXIN ) 500 MG tablet, Take 500 mg by mouth daily as needed for muscle  spasms., Disp: , Rfl:    metoprolol  succinate (TOPROL -XL) 50 MG 24 hr tablet, Take 1 tablet (50 mg total) by mouth daily. cardio (Patient taking differently: Take 50 mg by mouth 2 (two) times daily. cardio), Disp: 90 tablet, Rfl: 1   montelukast  (SINGULAIR ) 10 MG tablet, Take 1 tablet (10 mg total) by mouth at bedtime., Disp: 90 tablet, Rfl: 1   nitroGLYCERIN (NITROSTAT) 0.4 MG SL tablet, Place 0.4 mg under the tongue every 5 (five) minutes as needed for chest pain., Disp: , Rfl:    pantoprazole  (PROTONIX ) 40 MG tablet, Take 1 tablet (40 mg total) by mouth every evening. (Patient taking differently: Take 40 mg by mouth in the morning.), Disp: 90 tablet, Rfl: 1   potassium chloride  SA (KLOR-CON  M20) 20 MEQ tablet, Take 1 tablet (20 mEq total) by mouth 3 (three) times daily. (Patient taking differently: Take 20-40 mEq by mouth See admin instructions. Take 20 meq in the morning and 40 meq in the evening), Disp: 270 tablet, Rfl: 1   rosuvastatin  (CRESTOR ) 5 MG tablet, TAKE 1 TABLET BY MOUTH THREE TIMES A WEEK (Patient taking differently: Take 5 mg by mouth See admin instructions. TAKE 1 TABLET BY MOUTH THREE TIMES A WEEK Sun, Tues, and  thurs, in the evening), Disp: 90 tablet, Rfl: 1   tadalafil  (CIALIS ) 10 MG tablet, Take 1 tablet (10 mg total) by mouth every other day. Okay to take an additional 10 mg  boost dose as needed 45 minutes prior to sexual activity (Patient taking differently: Take 10 mg by mouth daily as needed for erectile dysfunction. Okay to take an additional 10 mg boost dose as needed 45 minutes prior to sexual activity), Disp: 90 tablet, Rfl: 3   tamsulosin  (FLOMAX ) 0.4 MG CAPS capsule, Take 1 capsule (0.4 mg total) by mouth daily., Disp: 90 capsule, Rfl: 3   traMADol  (ULTRAM ) 50 MG tablet, Take 50 mg by mouth every 6 (six) hours as needed., Disp: , Rfl:    triamterene-hydrochlorothiazide  (MAXZIDE-25) 37.5-25 MG tablet, Take 0.5 tablets by mouth in the morning., Disp: , Rfl:    valsartan   (DIOVAN ) 320 MG tablet, Take 1 tablet (320 mg total) by mouth daily. (Patient taking differently: Take 160 mg by mouth at bedtime.), Disp: 90 tablet, Rfl: 1  Past Medical History: Past Medical History:  Diagnosis Date   Acquired deafness, right    per pt completed deafness right ear since age 74   Allergic rhinitis, seasonal    Anticoagulant long-term use    eliquis --- managed by cardiology   BPH associated with nocturia    urologist--- dr alvaro   Cancer [facility])    basal and squamous cell   Chronic low back pain    DDD (degenerative disc disease), lumbosacral    Foraminal stenosis of lumbosacral region    GERD (gastroesophageal reflux disease)    Heart murmur    History of kidney stones    History of skin cancer    Hydronephrosis, left    Hyperlipidemia, mixed    Hypertension, essential    followed by pcp and cardiology   Hypokalemia    OA (osteoarthritis)    Paroxysmal atrial fibrillation (HCC) 01/28/2017   cardiologist--- dr hester;   pt had ETT 03-16-2017 per cardio note normal, event monitor 02-09-2017 showed baseline SR some peroids  Afib with controlled rate with occ PAC/ PVC;  per pt had echo many yrs ago prior to AFib    Tobacco Use: Social History   Tobacco Use  Smoking Status Never  Smokeless Tobacco Former   Types: Chew   Quit date: 03/2020    Labs: Review Flowsheet  More data exists      Latest Ref Rng & Units 06/15/2022 08/24/2022 03/01/2023 09/15/2023 02/13/2024  Labs for ITP Cardiac and Pulmonary Rehab  Cholestrol 100 - 199 mg/dL 871  - 865  785  843   LDL (calc) 0 - 99 mg/dL 67  - 78  853  97   HDL-C >39 mg/dL 46  - 40  43  43   Trlycerides 0 - 149 mg/dL 76  - 84  859  83   Hemoglobin A1c 4.8 - 5.6 % - 6.1  6.4  - -     Exercise Target Goals: Exercise Program Goal: Individual exercise prescription set using results from initial 6 min walk test and THRR while considering  patient's activity barriers and safety.   Exercise Prescription Goal: Initial  exercise prescription builds to 30-45 minutes a day of aerobic activity, 2-3 days per week.  Home exercise guidelines will be given to patient during program as part of exercise prescription that the participant will acknowledge.   Education: Aerobic Exercise: - Group verbal and visual presentation on the components of exercise prescription. Introduces F.I.T.T principle from ACSM for exercise prescriptions.  Reviews F.I.T.T. principles of aerobic exercise including progression. Written material provided at class time.   Education: Resistance Exercise: - Group verbal and visual presentation on  the components of exercise prescription. Introduces F.I.T.T principle from ACSM for exercise prescriptions  Reviews F.I.T.T. principles of resistance exercise including progression. Written material provided at class time.    Education: Exercise & Equipment Safety: - Individual verbal instruction and demonstration of equipment use and safety with use of the equipment. Flowsheet Row Cardiac Rehab from 06/27/2024 in [facility] Cardiac and Pulmonary Rehab  Date 06/27/24  Educator MB  Instruction Review Code 1- Verbalizes Understanding    Education: Exercise Physiology & General Exercise Guidelines: - Group verbal and written instruction with models to review the exercise physiology of the cardiovascular system and associated critical values. Provides general exercise guidelines with specific guidelines to those with heart or lung disease. Written material provided at class time. Flowsheet Row Cardiac Rehab from 06/27/2024 in [facility] Cardiac and Pulmonary Rehab  Education need identified 06/27/24    Education: Flexibility, Balance, Mind/Body Relaxation: - Group verbal and visual presentation with interactive activity on the components of exercise prescription. Introduces F.I.T.T principle from ACSM for exercise prescriptions. Reviews F.I.T.T. principles of flexibility and balance exercise training including  progression. Also discusses the mind body connection.  Reviews various relaxation techniques to help reduce and manage stress (i.e. Deep breathing, progressive muscle relaxation, and visualization). Balance handout provided to take home. Written material provided at class time.   Activity Barriers & Risk Stratification:  Activity Barriers & Cardiac Risk Stratification - 06/27/24 1007       Activity Barriers & Cardiac Risk Stratification   Activity Barriers Back Problems;Other (comment)    Comments R leg    Cardiac Risk Stratification Moderate          6 Minute Walk:  6 Minute Walk     Row Name 06/27/24 1006         6 Minute Walk   Phase Initial     Distance 1300 feet     Walk Time 6 minutes     # of Rest Breaks 0     MPH 2.46     METS 3.27     RPE 11     Perceived Dyspnea  0     VO2 Peak 11.44     Symptoms No     Resting HR 62 bpm     Resting BP 154/82     Resting Oxygen Saturation  97 %     Exercise Oxygen Saturation  during 6 min walk 95 %     Max Ex. HR 112 bpm     Max Ex. BP 180/92     2 Minute Post BP 168/90        Oxygen Initial Assessment:   Oxygen Re-Evaluation:   Oxygen Discharge (Final Oxygen Re-Evaluation):   Initial Exercise Prescription:  Initial Exercise Prescription - 06/27/24 1000       Date of Initial Exercise RX and Referring Provider   Date 06/27/24    Referring Provider Florencio Kava, MD      Oxygen   Maintain Oxygen Saturation 88% or higher      Treadmill   MPH 2.5    Grade 0    Minutes 15    METs 2.91      REL-XR   Level 2    Speed 50    Minutes 15    METs 3.27      T5 Nustep   Level 3    SPM 80    Minutes 15    METs 3.27      Biostep-RELP   Level 3  SPM 50    Minutes 15    METs 3.27      Prescription Details   Frequency (times per week) 2    Duration Progress to 30 minutes of continuous aerobic without signs/symptoms of physical distress      Intensity   THRR 40-80% of Max Heartrate 97-132     Ratings of Perceived Exertion 11-13    Perceived Dyspnea 0-4      Progression   Progression Continue to progress workloads to maintain intensity without signs/symptoms of physical distress.      Resistance Training   Training Prescription Yes    Weight 10 lb    Reps 10-15          Perform Capillary Blood Glucose checks as needed.  Exercise Prescription Changes:   Exercise Prescription Changes     Row Name 06/27/24 1000 07/09/24 1500 07/23/24 0900 08/06/24 1400       Response to Exercise   Blood Pressure (Admit) 154/82 114/68 118/82 118/78    Blood Pressure (Exercise) 180/92 136/70 122/70 160/80    Blood Pressure (Exit) 150/90 112/70 128/80 118/72    Heart Rate (Admit) 62 bpm 63 bpm 56 bpm 64 bpm    Heart Rate (Exercise) 112 bpm 85 bpm 90 bpm 102 bpm    Heart Rate (Exit) 61 bpm 58 bpm 65 bpm 78 bpm    Oxygen Saturation (Admit) 97 % -- -- --    Oxygen Saturation (Exercise) 95 % -- -- --    Oxygen Saturation (Exit) 96 % -- -- --    Rating of Perceived Exertion (Exercise) 11 13 11 12     Perceived Dyspnea (Exercise) 0 -- -- --    Symptoms none none none none    Comments results First full day of exercise -- --    Duration -- Continue with 30 min of aerobic exercise without signs/symptoms of physical distress. Continue with 30 min of aerobic exercise without signs/symptoms of physical distress. Continue with 30 min of aerobic exercise without signs/symptoms of physical distress.    Intensity -- THRR unchanged THRR unchanged THRR unchanged      Progression   Progression -- Continue to progress workloads to maintain intensity without signs/symptoms of physical distress. Continue to progress workloads to maintain intensity without signs/symptoms of physical distress. Continue to progress workloads to maintain intensity without signs/symptoms of physical distress.    Average METs 3.27 3.76 3.09 2.87      Resistance Training   Training Prescription -- Yes Yes Yes    Weight --  10 lb 10 lb 10 lb    Reps -- 10-15 10-15 10-15      Interval Training   Interval Training -- No No No      Treadmill   MPH -- 2.5 2.7 2.8    Grade -- 0 0 0    Minutes -- 15 15 15     METs -- 2.91 3.07 3.14      NuStep   Level -- 6 -- 4  T6 nustep    Minutes -- 15 -- 15    METs -- 4.6 -- 2.4      REL-XR   Level -- -- -- 4    Minutes -- -- -- 15      T5 Nustep   Level -- -- 5 --    Minutes -- -- 15 --    METs -- -- 3.1 --      Oxygen   Maintain Oxygen Saturation -- 88%  or higher 88% or higher 88% or higher       Exercise Comments:   Exercise Comments     Row Name 07/04/24 0910           Exercise Comments First full day of exercise!  Patient was oriented to gym and equipment including functions, settings, policies, and procedures.  Patient's individual exercise prescription and treatment plan were reviewed.  All starting workloads were established based on the results of the 6 minute walk test done at initial orientation visit.  The plan for exercise progression was also introduced and progression will be customized based on patient's performance and goals.          Exercise Goals and Review:   Exercise Goals     Row Name 06/27/24 1015             Exercise Goals   Increase Physical Activity Yes       Intervention Provide advice, education, support and counseling about physical activity/exercise needs.;Develop an individualized exercise prescription for aerobic and resistive training based on initial evaluation findings, risk stratification, comorbidities and participant's personal goals.       Expected Outcomes Short Term: Attend rehab on a regular basis to increase amount of physical activity.;Long Term: Add in home exercise to make exercise part of routine and to increase amount of physical activity.;Long Term: Exercising regularly at least 3-5 days a week.       Increase Strength and Stamina Yes       Intervention Provide advice, education, support and  counseling about physical activity/exercise needs.;Develop an individualized exercise prescription for aerobic and resistive training based on initial evaluation findings, risk stratification, comorbidities and participant's personal goals.       Expected Outcomes Short Term: Increase workloads from initial exercise prescription for resistance, speed, and METs.;Short Term: Perform resistance training exercises routinely during rehab and add in resistance training at home;Long Term: Improve cardiorespiratory fitness, muscular endurance and strength as measured by increased METs and functional capacity ( )       Able to understand and use rate of perceived exertion (RPE) scale Yes       Intervention Provide education and explanation on how to use RPE scale       Expected Outcomes Short Term: Able to use RPE daily in rehab to express subjective intensity level;Long Term:  Able to use RPE to guide intensity level when exercising independently       Able to understand and use Dyspnea scale Yes       Intervention Provide education and explanation on how to use Dyspnea scale       Expected Outcomes Short Term: Able to use Dyspnea scale daily in rehab to express subjective sense of shortness of breath during exertion;Long Term: Able to use Dyspnea scale to guide intensity level when exercising independently       Knowledge and understanding of Target Heart Rate Range (THRR) Yes       Intervention Provide education and explanation of THRR including how the numbers were predicted and where they are located for reference       Expected Outcomes Short Term: Able to state/look up THRR;Short Term: Able to use daily as guideline for intensity in rehab;Long Term: Able to use THRR to govern intensity when exercising independently       Able to check pulse independently Yes       Intervention Provide education and demonstration on how to check pulse in carotid and radial arteries.;Review the  importance of being able to  check your own pulse for safety during independent exercise       Expected Outcomes Short Term: Able to explain why pulse checking is important during independent exercise;Long Term: Able to check pulse independently and accurately       Understanding of Exercise Prescription Yes       Intervention Provide education, explanation, and written materials on patient's individual exercise prescription       Expected Outcomes Short Term: Able to explain program exercise prescription;Long Term: Able to explain home exercise prescription to exercise independently          Exercise Goals Re-Evaluation :  Exercise Goals Re-Evaluation     Row Name 07/04/24 0911 07/09/24 1514 07/23/24 0934 08/06/24 1423 08/22/24 1117     Exercise Goal Re-Evaluation   Exercise Goals Review Increase Physical Activity;Able to understand and use rate of perceived exertion (RPE) scale;Knowledge and understanding of Target Heart Rate Range (THRR);Understanding of Exercise Prescription;Increase Strength and Stamina;Able to understand and use Dyspnea scale;Able to check pulse independently Increase Physical Activity;Increase Strength and Stamina;Understanding of Exercise Prescription Increase Physical Activity;Increase Strength and Stamina;Understanding of Exercise Prescription Increase Physical Activity;Increase Strength and Stamina;Understanding of Exercise Prescription Increase Physical Activity;Increase Strength and Stamina;Understanding of Exercise Prescription   Comments Reviewed RPE and dyspnea scale, THR and program prescription with pt today.  Pt voiced understanding and was given a copy of goals to take home. Velinda is off to a good start in the program. He did well on the treadmill at a speed of 2.5 mph with no incline. He also did well on the T4 nustep at level 6. We will continue to monitor his progress in the program. Velinda has only attended rehab once since the last review. During his one session he did increase his treadmill  speed to 2.7 mph with no incline. He also improved to level 5 on the T5 nustep. We will continue to monitor his progress in the program. Velinda has only attended two sessions of rehab since the last review. During his one session he did increase his treadmill speed to 2.8 mph with no incline. He also improved to level 4 on the XR. We will continue to monitor his progress in the program. Velinda has not attended rehab since the last review as he has been placed on medical hold. He informed staff that he will be out of town and then he will have surgery. We will continue to monitor his progress when he returns to the program.   Expected Outcomes Short: Use RPE daily to regulate intensity.  Long: Follow program prescription in THR. Short: Continue to follow current exercise prescription. Long: Continue exercise to improve strength and stamina. Short: Attend rehab more consistently. Long: Continue exercise to improve strength and stamina. Short: Attend rehab more consistently. Long: Continue exercise to improve strength and stamina. Short: Return to rehab when appropriate. Long: Graduate.    Row Name 09/04/24 1129             Exercise Goal Re-Evaluation   Exercise Goals Review Increase Physical Activity;Increase Strength and Stamina;Understanding of Exercise Prescription       Comments Velinda has not attended rehab since the last review as he continues to be on medical hold. He plans to return to the program on 09/12/2024. We will continue to monitor his progress when he returns to the program.       Expected Outcomes Short: Return to rehab when appropriate. Long: Graduate.  Discharge Exercise Prescription (Final Exercise Prescription Changes):  Exercise Prescription Changes - 08/06/24 1400       Response to Exercise   Blood Pressure (Admit) 118/78    Blood Pressure (Exercise) 160/80    Blood Pressure (Exit) 118/72    Heart Rate (Admit) 64 bpm    Heart Rate (Exercise) 102 bpm    Heart Rate (Exit)  78 bpm    Rating of Perceived Exertion (Exercise) 12    Symptoms none    Duration Continue with 30 min of aerobic exercise without signs/symptoms of physical distress.    Intensity THRR unchanged      Progression   Progression Continue to progress workloads to maintain intensity without signs/symptoms of physical distress.    Average METs 2.87      Resistance Training   Training Prescription Yes    Weight 10 lb    Reps 10-15      Interval Training   Interval Training No      Treadmill   MPH 2.8    Grade 0    Minutes 15    METs 3.14      NuStep   Level 4   T6 nustep   Minutes 15    METs 2.4      REL-XR   Level 4    Minutes 15      Oxygen   Maintain Oxygen Saturation 88% or higher          Nutrition:  Target Goals: Understanding of nutrition guidelines, daily intake of sodium 1500mg , cholesterol 200mg , calories 30% from fat and 7% or less from saturated fats, daily to have 5 or more servings of fruits and vegetables.  Education: Nutrition 1 -Group instruction provided by verbal, written material, interactive activities, discussions, models, and posters to present general guidelines for heart healthy nutrition including macronutrients, label reading, and promoting whole foods over processed counterparts. Education serves as pensions consultant of discussion of heart healthy eating for all. Written material provided at class time.    Education: Nutrition 2 -Group instruction provided by verbal, written material, interactive activities, discussions, models, and posters to present general guidelines for heart healthy nutrition including sodium, cholesterol, and saturated fat. Providing guidance of habit forming to improve blood pressure, cholesterol, and body weight. Written material provided at class time.     Biometrics:  Pre Biometrics - 06/27/24 1015       Pre Biometrics   Height 6' 1.7 (1.872 m)    Weight 240 lb 8 oz (109.1 kg)    Waist Circumference 46.3 inches     Hip Circumference 43 inches    Waist to Hip Ratio 1.08 %    BMI (Calculated) 31.13    Single Leg Stand 10 seconds           Nutrition Therapy Plan and Nutrition Goals:  Nutrition Therapy & Goals - 06/27/24 1017       Nutrition Therapy   RD appointment deferred Yes      Personal Nutrition Goals   Nutrition Goal RD appointment deferred at this time      Intervention Plan   Intervention Prescribe, educate and counsel regarding individualized specific dietary modifications aiming towards targeted core components such as weight, hypertension, lipid management, diabetes, heart failure and other comorbidities.    Expected Outcomes Short Term Goal: Understand basic principles of dietary content, such as calories, fat, sodium, cholesterol and nutrients.          Nutrition Assessments:  MEDIFICTS Score Key: >=70 Need  to make dietary changes  40-70 Heart Healthy Diet <= 40 Therapeutic Level Cholesterol Diet  Flowsheet Row Cardiac Rehab from 06/27/2024 in [facility] Cardiac and Pulmonary Rehab  Picture Your Plate Total Score on Admission 60   Picture Your Plate Scores: <59 Unhealthy dietary pattern with much room for improvement. 41-50 Dietary pattern unlikely to meet recommendations for good health and room for improvement. 51-60 More healthful dietary pattern, with some room for improvement.  >60 Healthy dietary pattern, although there may be some specific behaviors that could be improved.    Nutrition Goals Re-Evaluation:   Nutrition Goals Discharge (Final Nutrition Goals Re-Evaluation):   Psychosocial: Target Goals: Acknowledge presence or absence of significant depression and/or stress, maximize coping skills, provide positive support system. Participant is able to verbalize types and ability to use techniques and skills needed for reducing stress and depression.   Education: Stress, Anxiety, and Depression - Group verbal and visual presentation to define topics covered.   Reviews how body is impacted by stress, anxiety, and depression.  Also discusses healthy ways to reduce stress and to treat/manage anxiety and depression. Written material provided at class time.   Education: Sleep Hygiene -Provides group verbal and written instruction about how sleep can affect your health.  Define sleep hygiene, discuss sleep cycles and impact of sleep habits. Review good sleep hygiene tips.   Initial Review & Psychosocial Screening:  Initial Psych Review & Screening - 06/26/24 0957       Initial Review   Current issues with None Identified      Family Dynamics   Good Support System? 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 Tim stated that he does not have any stressors and has a good support system of his wife and family. Patient stated he particularly enjoys his time with his 10 grandchildren.Tim was pleasant to speak with and stated that he enjoys yard work and remains active. The patient stated he does have back pain/issues, but other than that manages well with his physical and mental health.      Barriers   Psychosocial barriers to participate in program The patient should benefit from training in stress management and relaxation.      Screening Interventions   Interventions To provide support and resources with identified psychosocial needs;Provide feedback about the scores to participant;Encouraged to exercise    Expected Outcomes Short Term goal: Identification and review with participant of any Quality of Life or Depression concerns found by scoring the questionnaire.;Long Term goal: The participant improves quality of Life and PHQ9 Scores as seen by post scores and/or verbalization of changes          Quality of Life Scores:   Quality of Life - 06/27/24 1017       Quality of Life   Select Quality of Life      Quality of Life Scores   Health/Function Pre 12.73 %     Socioeconomic Pre 12.69 %    Psych/Spiritual Pre 12.79 %    Family Pre 13.1 %    GLOBAL Pre 12.79 %         Scores of 19 and below usually indicate a poorer quality of life in these areas.  A difference of  2-3 points is a clinically meaningful difference.  A difference of 2-3 points in the total score of the Quality of Life Index has been associated with significant improvement in overall quality  of life, self-image, physical symptoms, and general health in studies assessing change in quality of life.  PHQ-9: Review Flowsheet  More data exists      06/27/2024 02/12/2024 10/05/2023 09/15/2023 09/01/2023  Depression screen PHQ 2/9  Decreased Interest 0 0 0 0 0  Down, Depressed, Hopeless 0 0 0 0 0  PHQ - 2 Score 0 0 0 0 0  Altered sleeping 0 0 0 0 0  Tired, decreased energy 0 1 0 0 0  Change in appetite 0 0 0 0 0  Feeling bad or failure about yourself  0 0 0 0 0  Trouble concentrating 0 0 0 0 0  Moving slowly or fidgety/restless 0 0 0 0 0  Suicidal thoughts 0 0 0 0 0  PHQ-9 Score 0  1  0  0  0   Difficult doing work/chores Not difficult at all Not difficult at all Not difficult at all Not difficult at all Not difficult at all    Details       Data saved with a previous flowsheet row definition        Interpretation of Total Score  Total Score Depression Severity:  1-4 = Minimal depression, 5-9 = Mild depression, 10-14 = Moderate depression, 15-19 = Moderately severe depression, 20-27 = Severe depression   Psychosocial Evaluation and Intervention:  Psychosocial Evaluation - 06/26/24 0958       Psychosocial Evaluation & Interventions   Interventions Stress management education;Relaxation education;Encouraged to exercise with the program and follow exercise prescription    Comments 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 Tim stated that he does not have any stressors and has a good support system of his wife and  family. Patient stated he particularly enjoys his time with his 10 grandchildren.Tim was pleasant to speak with and stated that he enjoys yard work and remains active. The patient stated he does have back pain/issues, but other than that manages well with his physical and mental health.    Expected Outcomes ST: Attend cardiac rehab for education and exercise; LT: Develop and maintain positive self-care habits    Continue Psychosocial Services  Follow up required by staff          Psychosocial Re-Evaluation:   Psychosocial Discharge (Final Psychosocial Re-Evaluation):   Vocational Rehabilitation: Provide vocational rehab assistance to qualifying candidates.   Vocational Rehab Evaluation & Intervention:  Vocational Rehab - 06/26/24 0957       Initial Vocational Rehab Evaluation & Intervention   Assessment shows need for Vocational Rehabilitation No          Education: Education Goals: Education classes will be provided on a variety of topics geared toward better understanding of heart health and risk factor modification. Participant will state understanding/return demonstration of topics presented as noted by education test scores.  Learning Barriers/Preferences:  Learning Barriers/Preferences - 06/26/24 0957       Learning Barriers/Preferences   Learning Barriers None    Learning Preferences Group Instruction;Individual Instruction;Verbal Instruction;Video;Written Material          General Cardiac Education Topics:  AED/CPR: - Group verbal and written instruction with the use of models to demonstrate the basic use of the AED with the basic ABC's of resuscitation.   Test and Procedures: - Group verbal and visual presentation and models provide information about basic cardiac anatomy and function. Reviews the testing methods done to diagnose heart disease and the outcomes of the test  results. Describes the treatment choices: Medical Management, Angioplasty, or Coronary  Bypass Surgery for treating various heart conditions including Myocardial Infarction, Angina, Valve Disease, and Cardiac Arrhythmias. Written material provided at class time.   Medication Safety: - Group verbal and visual instruction to review commonly prescribed medications for heart and lung disease. Reviews the medication, class of the drug, and side effects. Includes the steps to properly store meds and maintain the prescription regimen. Written material provided at class time.   Intimacy: - Group verbal instruction through game format to discuss how heart and lung disease can affect sexual intimacy. Written material provided at class time.   Know Your Numbers and Heart Failure: - Group verbal and visual instruction to discuss disease risk factors for cardiac and pulmonary disease and treatment options.  Reviews associated critical values for Overweight/Obesity, Hypertension, Cholesterol, and Diabetes.  Discusses basics of heart failure: signs/symptoms and treatments.  Introduces Heart Failure Zone chart for action plan for heart failure. Written material provided at class time.   Infection Prevention: - Provides verbal and written material to individual with discussion of infection control including proper hand washing and proper equipment cleaning during exercise session. Flowsheet Row Cardiac Rehab from 06/27/2024 in [facility] Cardiac and Pulmonary Rehab  Date 06/27/24  Educator MB  Instruction Review Code 1- Verbalizes Understanding    Falls Prevention: - Provides verbal and written material to individual with discussion of falls prevention and safety. Flowsheet Row Cardiac Rehab from 06/27/2024 in [facility] Cardiac and Pulmonary Rehab  Date 06/27/24  Educator MB  Instruction Review Code 1- Verbalizes Understanding    Other: -Provides group and verbal instruction on various topics (see comments) Flowsheet Row Cardiac Rehab from 06/27/2024 in [facility] Cardiac and Pulmonary Rehab  Date 06/27/24   [facility] and Cardiac Procedures]    Knowledge Questionnaire Score:  Knowledge Questionnaire Score - 06/27/24 1018       Knowledge Questionnaire Score   Pre Score 23/26          Core Components/Risk Factors/Patient Goals at Admission:  Personal Goals and Risk Factors at Admission - 06/27/24 1019       Core Components/Risk Factors/Patient Goals on Admission    Weight Management Weight Loss;Yes    Intervention Weight Management: Develop a combined nutrition and exercise program designed to reach desired caloric intake, while maintaining appropriate intake of nutrient and fiber, sodium and fats, and appropriate energy expenditure required for the weight goal.;Weight Management: Provide education and appropriate resources to help participant work on and attain dietary goals.;Weight Management/Obesity: Establish reasonable short term and long term weight goals.    Admit Weight 240 lb 8 oz (109.1 kg)    Goal Weight: Short Term 230 lb (104.3 kg)    Goal Weight: Long Term 220 lb (99.8 kg)    Expected Outcomes Short Term: Continue to assess and modify interventions until short term weight is achieved;Long Term: Adherence to nutrition and physical activity/exercise program aimed toward attainment of established weight goal;Weight Loss: Understanding of general recommendations for a balanced deficit meal plan, which promotes 1-2 lb weight loss per week and includes a negative energy balance of [phone] kcal/d;Understanding recommendations for meals to include 15-35% energy as protein, 25-35% energy from fat, 35-60% energy from carbohydrates, less than 200mg  of dietary cholesterol, 20-35 gm of total fiber daily;Understanding of distribution of calorie intake throughout the day with the consumption of 4-5 meals/snacks    Hypertension Yes    Intervention Provide education on lifestyle modifcations including regular physical activity/exercise, weight management,  moderate sodium restriction and increased  consumption of fresh fruit, vegetables, and low fat dairy, alcohol moderation, and smoking cessation.;Monitor prescription use compliance.    Expected Outcomes Short Term: Continued assessment and intervention until BP is < 140/76mm HG in hypertensive participants. < 130/1mm HG in hypertensive participants with diabetes, heart failure or chronic kidney disease.;Long Term: Maintenance of blood pressure at goal levels.    Lipids Yes    Intervention Provide education and support for participant on nutrition & aerobic/resistive exercise along with prescribed medications to achieve LDL 70mg , HDL >40mg .    Expected Outcomes Short Term: Participant states understanding of desired cholesterol values and is compliant with medications prescribed. Participant is following exercise prescription and nutrition guidelines.;Long Term: Cholesterol controlled with medications as prescribed, with individualized exercise RX and with personalized nutrition plan. Value goals: LDL < 70mg , HDL > 40 mg.          Education:Diabetes - Individual verbal and written instruction to review signs/symptoms of diabetes, desired ranges of glucose level fasting, after meals and with exercise. Acknowledge that pre and post exercise glucose checks will be done for 3 sessions at entry of program.   Core Components/Risk Factors/Patient Goals Review:    Core Components/Risk Factors/Patient Goals at Discharge (Final Review):    ITP Comments:  ITP Comments     Row Name 06/26/24 1001 06/27/24 1006 07/04/24 0910 07/17/24 1001 08/14/24 0825   ITP Comments Initial phone call completed. Diagnosis can be found in [facility] 06/12/2024. EP Orientation scheduled for Thursday, June 27, 2024, @ 0800. Completed and gym orientation for cardiac rehab. Initial ITP created and sent for review to Dr. Oneil Pinal, Medical Director. First full day of exercise!  Patient was oriented to gym and equipment including functions, settings, policies, and  procedures.  Patient's individual exercise prescription and treatment plan were reviewed.  All starting workloads were established based on the results of the 6 minute walk test done at initial orientation visit.  The plan for exercise progression was also introduced and progression will be customized based on patient's performance and goals. 30 Day review completed. Medical Director ITP review done, changes made as directed, and signed approval by Medical Director. New to program. 30 Day review completed. Medical Director ITP review done, changes made as directed, and signed approval by Medical Director.    Row Name 09/11/24 0937           ITP Comments 30 Day review completed. Medical Director ITP review done, changes made as directed, and signed approval by Medical Director.          Comments: 30 Day Review ITP

## 2024-09-12 ENCOUNTER — Encounter: Attending: Internal Medicine | Admitting: Emergency Medicine

## 2024-09-12 DIAGNOSIS — Z955 Presence of coronary angioplasty implant and graft: Secondary | ICD-10-CM | POA: Diagnosis not present

## 2024-09-12 NOTE — Progress Notes (Signed)
 Daily Session Note  Patient Details  Name: DAYSHAWN IRIZARRY MRN: [id number] Date of Birth: 11-03-1955 Referring Provider:   Flowsheet Row Cardiac Rehab from 06/27/2024 in [facility] Cardiac and Pulmonary Rehab  Referring Provider Florencio Kava, MD    Encounter Date: 09/12/2024  Check In:  Session Check In - 09/12/24 9072       Check-In   Supervising physician immediately available to respond to emergencies See telemetry face sheet for immediately available ER MD    Location [facility]-Cardiac & Pulmonary Rehab    Staff Present Leita Franks RN,BSN;Joseph [facility] BS, Exercise Physiologist;Margaret Best, MS, Exercise Physiologist;Jason Elnor RDN,LDN    Virtual Visit No    Medication changes reported     Yes    Comments decrease metoprolol  dose; discontinued diltiazem     Fall or balance concerns reported    No    Warm-up and Cool-down Performed on first and last piece of equipment    Resistance Training Performed Yes    VAD Patient? No    PAD/SET Patient? No      Pain Assessment   Currently in Pain? No/denies             Social History   Tobacco Use  Smoking Status Never  Smokeless Tobacco Former   Types: Chew   Quit date: 03/2020    Goals Met:  Independence with exercise equipment Exercise tolerated well No report of concerns or symptoms today Strength training completed today  Goals Unmet:  Not Applicable  Comments: Pt able to follow exercise prescription today without complaint.  Will continue to monitor for progression.    Dr. Oneil Pinal is Medical Director for [facility] Cardiac Rehabilitation.  Dr. Fuad Aleskerov is Medical Director for [facility] Pulmonary Rehabilitation.

## 2024-09-17 ENCOUNTER — Encounter

## 2024-09-17 DIAGNOSIS — S32009K Unspecified fracture of unspecified lumbar vertebra, subsequent encounter for fracture with nonunion: Secondary | ICD-10-CM | POA: Diagnosis not present

## 2024-09-24 ENCOUNTER — Encounter: Attending: Internal Medicine

## 2024-09-24 DIAGNOSIS — Z955 Presence of coronary angioplasty implant and graft: Secondary | ICD-10-CM | POA: Insufficient documentation

## 2024-09-24 NOTE — Progress Notes (Signed)
 Daily Session Note  Patient Details  Name: [name] MRN: [id number] Date of Birth: 1956/01/12 Referring Provider:   Flowsheet Row Cardiac Rehab from 06/27/2024 in [facility] Cardiac and Pulmonary Rehab  Referring Provider Florencio Kava, MD    Encounter Date: 09/24/2024  Check In:  Session Check In - 09/24/24 0930       Check-In   Supervising physician immediately available to respond to emergencies See telemetry face sheet for immediately available ER MD    Location [facility]-Cardiac & Pulmonary Rehab    Staff Present Burnard Davenport RN,BSN,MPA;Maxon Conetta BS, Exercise Physiologist;Margaret Best, MS, Exercise Physiologist;Jason Elnor RDN,LDN;Noah Tickle, BS, Exercise Physiologist    Virtual Visit No    Medication changes reported     No    Fall or balance concerns reported    No    Warm-up and Cool-down Performed on first and last piece of equipment    Resistance Training Performed Yes    VAD Patient? No    PAD/SET Patient? No      Pain Assessment   Currently in Pain? No/denies             Social History   Tobacco Use  Smoking Status Never  Smokeless Tobacco Former   Types: Chew   Quit date: 03/2020    Goals Met:  Independence with exercise equipment Exercise tolerated well No report of concerns or symptoms today Strength training completed today  Goals Unmet:  Not Applicable  Comments: Pt able to follow exercise prescription today without complaint.  Will continue to monitor for progression.    Dr. Oneil Pinal is Medical Director for [facility] Cardiac Rehabilitation.  Dr. Fuad Aleskerov is Medical Director for [facility] Pulmonary Rehabilitation.

## 2024-09-26 ENCOUNTER — Encounter: Admitting: Emergency Medicine

## 2024-09-26 DIAGNOSIS — Z955 Presence of coronary angioplasty implant and graft: Secondary | ICD-10-CM

## 2024-09-26 NOTE — Progress Notes (Signed)
 Daily Session Note  Patient Details  Name: [name] MRN: [id number] Date of Birth: Oct 15, 1956 Referring Provider:   Flowsheet Row Cardiac Rehab from 06/27/2024 in [facility] Cardiac and Pulmonary Rehab  Referring Provider Florencio Kava, MD    Encounter Date: 09/26/2024  Check In:  Session Check In - 09/26/24 9072       Check-In   Supervising physician immediately available to respond to emergencies See telemetry face sheet for immediately available ER MD    Location [facility]-Cardiac & Pulmonary Rehab    Staff Present Maxon Conetta BS, Exercise Physiologist;Joseph Hood RCP,RRT,BSRT;Corie Vavra RN,BSN;Laureen [place], [place], RRT, CPFT    Virtual Visit No    Medication changes reported     No    Fall or balance concerns reported    No    Warm-up and Cool-down Performed on first and last piece of equipment    Resistance Training Performed Yes    VAD Patient? No    PAD/SET Patient? No      Pain Assessment   Currently in Pain? No/denies             Social History   Tobacco Use  Smoking Status Never  Smokeless Tobacco Former   Types: Chew   Quit date: 03/2020    Goals Met:  Independence with exercise equipment Exercise tolerated well No report of concerns or symptoms today Strength training completed today  Goals Unmet:  Not Applicable  Comments: Pt able to follow exercise prescription today without complaint.  Will continue to monitor for progression.    Dr. Oneil Pinal is Medical Director for [facility] Cardiac Rehabilitation.  Dr. Fuad Aleskerov is Medical Director for [facility] Pulmonary Rehabilitation.

## 2024-10-01 ENCOUNTER — Encounter

## 2024-10-03 ENCOUNTER — Encounter: Admitting: Emergency Medicine

## 2024-10-03 DIAGNOSIS — Z955 Presence of coronary angioplasty implant and graft: Secondary | ICD-10-CM

## 2024-10-03 NOTE — Progress Notes (Signed)
 Daily Session Note  Patient Details  Name: [name] MRN: [id number] Date of Birth: 07/03/1956 Referring Provider:   Flowsheet Row Cardiac Rehab from 06/27/2024 in [facility] Cardiac and Pulmonary Rehab  Referring Provider Florencio Kava, MD    Encounter Date: 10/03/2024  Check In:  Session Check In - 10/03/24 0948       Check-In   Supervising physician immediately available to respond to emergencies See telemetry face sheet for immediately available ER MD    Location [facility]-Cardiac & Pulmonary Rehab    Staff Present Leita Franks RN,BSN;Joseph [facility] BS, Exercise Physiologist;Kristen Coble RN,BC,MSN    Virtual Visit No    Medication changes reported     No    Fall or balance concerns reported    No    Warm-up and Cool-down Performed on first and last piece of equipment    Resistance Training Performed Yes    VAD Patient? No    PAD/SET Patient? No      Pain Assessment   Currently in Pain? No/denies             Tobacco Use History[1]  Goals Met:  Independence with exercise equipment Exercise tolerated well No report of concerns or symptoms today Strength training completed today  Goals Unmet:  Not Applicable  Comments: Pt able to follow exercise prescription today without complaint.  Will continue to monitor for progression.    Dr. Oneil Pinal is Medical Director for [facility] Cardiac Rehabilitation.  Dr. Fuad Aleskerov is Medical Director for [facility] Pulmonary Rehabilitation.    [1]  Social History Tobacco Use  Smoking Status Never  Smokeless Tobacco Former   Types: Chew   Quit date: 03/2020

## 2024-10-08 ENCOUNTER — Encounter

## 2024-10-08 DIAGNOSIS — Z955 Presence of coronary angioplasty implant and graft: Secondary | ICD-10-CM

## 2024-10-08 NOTE — Progress Notes (Signed)
 Daily Session Note  Patient Details  Name: [name] MRN: [id number] Date of Birth: 08-Jul-1956 Referring Provider:   Flowsheet Row Cardiac Rehab from 06/27/2024 in [facility] Cardiac and Pulmonary Rehab  Referring Provider Florencio Kava, MD    Encounter Date: 10/08/2024  Check In:  Session Check In - 10/08/24 9062       Check-In   Supervising physician immediately available to respond to emergencies See telemetry face sheet for immediately available ER MD    Location [facility]-Cardiac & Pulmonary Rehab    Staff Present Burnard Davenport RN,BSN,MPA;Maxon Conetta BS, Exercise Physiologist;Margaret Best, MS, Exercise Physiologist;Jason Elnor RDN,LDN    Virtual Visit No    Medication changes reported     No    Fall or balance concerns reported    No    Warm-up and Cool-down Performed on first and last piece of equipment    Resistance Training Performed Yes    VAD Patient? No    PAD/SET Patient? No      Pain Assessment   Currently in Pain? No/denies             Tobacco Use History[1]  Goals Met:  Independence with exercise equipment Exercise tolerated well No report of concerns or symptoms today Strength training completed today  Goals Unmet:  Not Applicable  Comments: Pt able to follow exercise prescription today without complaint.  Will continue to monitor for progression.    Dr. Oneil Pinal is Medical Director for [facility] Cardiac Rehabilitation.  Dr. Fuad Aleskerov is Medical Director for [facility] Pulmonary Rehabilitation.    [1]  Social History Tobacco Use  Smoking Status Never  Smokeless Tobacco Former   Types: Chew   Quit date: 03/2020

## 2024-10-09 ENCOUNTER — Encounter: Payer: Self-pay | Admitting: *Deleted

## 2024-10-09 DIAGNOSIS — Z955 Presence of coronary angioplasty implant and graft: Secondary | ICD-10-CM

## 2024-10-09 NOTE — Progress Notes (Signed)
 Cardiac Individual Treatment Plan  Patient Details  Name: [name] MRN: [id number] Date of Birth: 1956-01-17 Referring Provider:   Flowsheet Row Cardiac Rehab from 06/27/2024 in [facility] Cardiac and Pulmonary Rehab  Referring Provider Florencio Kava, MD    Initial Encounter Date:  Flowsheet Row Cardiac Rehab from 06/27/2024 in [facility] Cardiac and Pulmonary Rehab  Date 06/27/24    Visit Diagnosis: Status post coronary artery stent placement  Patient's Home Medications on Admission: Current Medications[1]  Past Medical History: Past Medical History:  Diagnosis Date   Acquired deafness, right    per pt completed deafness right ear since age 79   Allergic rhinitis, seasonal    Anticoagulant long-term use    eliquis --- managed by cardiology   BPH associated with nocturia    urologist--- dr alvaro   Cancer [facility])    basal and squamous cell   Chronic low back pain    DDD (degenerative disc disease), lumbosacral    Foraminal stenosis of lumbosacral region    GERD (gastroesophageal reflux disease)    Heart murmur    History of kidney stones    History of skin cancer    Hydronephrosis, left    Hyperlipidemia, mixed    Hypertension, essential    followed by pcp and cardiology   Hypokalemia    OA (osteoarthritis)    Paroxysmal atrial fibrillation (HCC) 01/28/2017   cardiologist--- dr hester;   pt had ETT 03-16-2017 per cardio note normal, event monitor 02-09-2017 showed baseline SR some peroids  Afib with controlled rate with occ PAC/ PVC;  per pt had echo many yrs ago prior to AFib    Tobacco Use: Tobacco Use History[2]  Labs: Review Flowsheet  More data exists      Latest Ref Rng & Units 06/15/2022 08/24/2022 03/01/2023 09/15/2023 02/13/2024  Labs for ITP Cardiac and Pulmonary Rehab  Cholestrol 100 - 199 mg/dL 871  - 865  785  843   LDL (calc) 0 - 99 mg/dL 67  - 78  853  97   HDL-C >39 mg/dL 46  - 40  43  43   Trlycerides 0 - 149 mg/dL 76  - 84  859  83   Hemoglobin A1c  4.8 - 5.6 % - 6.1  6.4  - -     Exercise Target Goals: Exercise Program Goal: Individual exercise prescription set using results from initial 6 min walk test and THRR while considering  patients activity barriers and safety.   Exercise Prescription Goal: Initial exercise prescription builds to 30-45 minutes a day of aerobic activity, 2-3 days per week.  Home exercise guidelines will be given to patient during program as part of exercise prescription that the participant will acknowledge.   Education: Aerobic Exercise: - Group verbal and visual presentation on the components of exercise prescription. Introduces F.I.T.T principle from ACSM for exercise prescriptions.  Reviews F.I.T.T. principles of aerobic exercise including progression. Written material provided at class time.   Education: Resistance Exercise: - Group verbal and visual presentation on the components of exercise prescription. Introduces F.I.T.T principle from ACSM for exercise prescriptions  Reviews F.I.T.T. principles of resistance exercise including progression. Written material provided at class time.    Education: Exercise & Equipment Safety: - Individual verbal instruction and demonstration of equipment use and safety with use of the equipment. Flowsheet Row Cardiac Rehab from 06/27/2024 in [facility] Cardiac and Pulmonary Rehab  Date 06/27/24  Educator MB  Instruction Review Code 1- [employer] Understanding    Education: Exercise  Physiology & General Exercise Guidelines: - Group verbal and written instruction with models to review the exercise physiology of the cardiovascular system and associated critical values. Provides general exercise guidelines with specific guidelines to those with heart or lung disease. Written material provided at class time. Flowsheet Row Cardiac Rehab from 06/27/2024 in [facility] Cardiac and Pulmonary Rehab  Education need identified 06/27/24    Education: Flexibility, Balance, Mind/Body  Relaxation: - Group verbal and visual presentation with interactive activity on the components of exercise prescription. Introduces F.I.T.T principle from ACSM for exercise prescriptions. Reviews F.I.T.T. principles of flexibility and balance exercise training including progression. Also discusses the mind body connection.  Reviews various relaxation techniques to help reduce and manage stress (i.e. Deep breathing, progressive muscle relaxation, and visualization). Balance handout provided to take home. Written material provided at class time.   Activity Barriers & Risk Stratification:  Activity Barriers & Cardiac Risk Stratification - 06/27/24 1007       Activity Barriers & Cardiac Risk Stratification   Activity Barriers Back Problems;Other (comment)    Comments R leg    Cardiac Risk Stratification Moderate          6 Minute Walk:  6 Minute Walk     Row Name 06/27/24 1006         6 Minute Walk   Phase Initial     Distance 1300 feet     Walk Time 6 minutes     # of Rest Breaks 0     MPH 2.46     METS 3.27     RPE 11     Perceived Dyspnea  0     VO2 Peak 11.44     Symptoms No     Resting HR 62 bpm     Resting BP 154/82     Resting Oxygen Saturation  97 %     Exercise Oxygen Saturation  during 6 min walk 95 %     Max Ex. HR 112 bpm     Max Ex. BP 180/92     2 Minute Post BP 168/90        Oxygen Initial Assessment:   Oxygen Re-Evaluation:   Oxygen Discharge (Final Oxygen Re-Evaluation):   Initial Exercise Prescription:  Initial Exercise Prescription - 06/27/24 1000       Date of Initial Exercise RX and Referring Provider   Date 06/27/24    Referring Provider Florencio Kava, MD      Oxygen   Maintain Oxygen Saturation 88% or higher      Treadmill   MPH 2.5    Grade 0    Minutes 15    METs 2.91      REL-XR   Level 2    Speed 50    Minutes 15    METs 3.27      T5 Nustep   Level 3    SPM 80    Minutes 15    METs 3.27      Biostep-RELP    Level 3    SPM 50    Minutes 15    METs 3.27      Prescription Details   Frequency (times per week) 2    Duration Progress to 30 minutes of continuous aerobic without signs/symptoms of physical distress      Intensity   THRR 40-80% of Max Heartrate 97-132    Ratings of Perceived Exertion 11-13    Perceived Dyspnea 0-4      Progression  Progression Continue to progress workloads to maintain intensity without signs/symptoms of physical distress.      Resistance Training   Training Prescription Yes    Weight 10 lb    Reps 10-15          Perform Capillary Blood Glucose checks as needed.  Exercise Prescription Changes:   Exercise Prescription Changes     Row Name 06/27/24 1000 07/09/24 1500 07/23/24 0900 08/06/24 1400 09/17/24 1400     Response to Exercise   Blood Pressure (Admit) 154/82 114/68 118/82 118/78 118/76   Blood Pressure (Exercise) 180/92 136/70 122/70 160/80 128/74   Blood Pressure (Exit) 150/90 112/70 128/80 118/72 140/80   Heart Rate (Admit) 62 bpm 63 bpm 56 bpm 64 bpm 63 bpm   Heart Rate (Exercise) 112 bpm 85 bpm 90 bpm 102 bpm 86 bpm   Heart Rate (Exit) 61 bpm 58 bpm 65 bpm 78 bpm 61 bpm   Oxygen Saturation (Admit) 97 % -- -- -- --   Oxygen Saturation (Exercise) 95 % -- -- -- --   Oxygen Saturation (Exit) 96 % -- -- -- --   Rating of Perceived Exertion (Exercise) 11 13 11 12 12    Perceived Dyspnea (Exercise) 0 -- -- -- --   Symptoms none none none none none   Comments results First full day of exercise -- -- --   Duration -- Continue with 30 min of aerobic exercise without signs/symptoms of physical distress. Continue with 30 min of aerobic exercise without signs/symptoms of physical distress. Continue with 30 min of aerobic exercise without signs/symptoms of physical distress. Continue with 30 min of aerobic exercise without signs/symptoms of physical distress.   Intensity -- THRR unchanged THRR unchanged THRR unchanged THRR unchanged      Progression   Progression -- Continue to progress workloads to maintain intensity without signs/symptoms of physical distress. Continue to progress workloads to maintain intensity without signs/symptoms of physical distress. Continue to progress workloads to maintain intensity without signs/symptoms of physical distress. Continue to progress workloads to maintain intensity without signs/symptoms of physical distress.   Average METs 3.27 3.76 3.09 2.87 3.91     Resistance Training   Training Prescription -- Yes Yes Yes Yes   Weight -- 10 lb 10 lb 10 lb 10 lb   Reps -- 10-15 10-15 10-15 10-15     Interval Training   Interval Training -- No No No No     Treadmill   MPH -- 2.5 2.7 2.8 2.5   Grade -- 0 0 0 0   Minutes -- 15 15 15 15    METs -- 2.91 3.07 3.14 2.91     NuStep   Level -- 6 -- 4  T6 nustep --   Minutes -- 15 -- 15 --   METs -- 4.6 -- 2.4 --     REL-XR   Level -- -- -- 4 --   Minutes -- -- -- 15 --     T5 Nustep   Level -- -- 5 -- --   Minutes -- -- 15 -- --   METs -- -- 3.1 -- --     Rower   Level -- -- -- -- 10   Watts -- -- -- -- 43   Minutes -- -- -- -- 15   METs -- -- -- -- 4.91     Oxygen   Maintain Oxygen Saturation -- 88% or higher 88% or higher 88% or higher 88% or higher    Row  Name 10/01/24 1500             Response to Exercise   Blood Pressure (Admit) 132/54       Blood Pressure (Exercise) 150/84       Blood Pressure (Exit) 118/70       Heart Rate (Admit) 64 bpm       Heart Rate (Exercise) 99 bpm       Heart Rate (Exit) 74 bpm       Rating of Perceived Exertion (Exercise) 12       Symptoms none       Duration Continue with 30 min of aerobic exercise without signs/symptoms of physical distress.       Intensity THRR unchanged         Progression   Progression Continue to progress workloads to maintain intensity without signs/symptoms of physical distress.       Average METs 4.3         Resistance Training   Training Prescription Yes        Weight 10 lb       Reps 10-15         Interval Training   Interval Training No         Treadmill   MPH 2.5       Grade 1       Minutes 15       METs 3.26         REL-XR   Level 9       Minutes 15       METs 5.8         Rower   Level 10       Watts 47       Minutes 15       METs 5         Oxygen   Maintain Oxygen Saturation 88% or higher          Exercise Comments:   Exercise Comments     Row Name 07/04/24 0910           Exercise Comments First full day of exercise!  Patient was oriented to gym and equipment including functions, settings, policies, and procedures.  Patient's individual exercise prescription and treatment plan were reviewed.  All starting workloads were established based on the results of the 6 minute walk test done at initial orientation visit.  The plan for exercise progression was also introduced and progression will be customized based on patient's performance and goals.          Exercise Goals and Review:   Exercise Goals     Row Name 06/27/24 1015             Exercise Goals   Increase Physical Activity Yes       Intervention Provide advice, education, support and counseling about physical activity/exercise needs.;Develop an individualized exercise prescription for aerobic and resistive training based on initial evaluation findings, risk stratification, comorbidities and participant's personal goals.       Expected Outcomes Short Term: Attend rehab on a regular basis to increase amount of physical activity.;Long Term: Add in home exercise to make exercise part of routine and to increase amount of physical activity.;Long Term: Exercising regularly at least 3-5 days a week.       Increase Strength and Stamina Yes       Intervention Provide advice, education, support and counseling about physical activity/exercise needs.;Develop an individualized exercise prescription for aerobic and resistive training  based on initial evaluation findings,  risk stratification, comorbidities and participant's personal goals.       Expected Outcomes Short Term: Increase workloads from initial exercise prescription for resistance, speed, and METs.;Short Term: Perform resistance training exercises routinely during rehab and add in resistance training at home;Long Term: Improve cardiorespiratory fitness, muscular endurance and strength as measured by increased METs and functional capacity ( )       Able to understand and use rate of perceived exertion (RPE) scale Yes       Intervention Provide education and explanation on how to use RPE scale       Expected Outcomes Short Term: Able to use RPE daily in rehab to express subjective intensity level;Long Term:  Able to use RPE to guide intensity level when exercising independently       Able to understand and use Dyspnea scale Yes       Intervention Provide education and explanation on how to use Dyspnea scale       Expected Outcomes Short Term: Able to use Dyspnea scale daily in rehab to express subjective sense of shortness of breath during exertion;Long Term: Able to use Dyspnea scale to guide intensity level when exercising independently       Knowledge and understanding of Target Heart Rate Range (THRR) Yes       Intervention Provide education and explanation of THRR including how the numbers were predicted and where they are located for reference       Expected Outcomes Short Term: Able to state/look up THRR;Short Term: Able to use daily as guideline for intensity in rehab;Long Term: Able to use THRR to govern intensity when exercising independently       Able to check pulse independently Yes       Intervention Provide education and demonstration on how to check pulse in carotid and radial arteries.;Review the importance of being able to check your own pulse for safety during independent exercise       Expected Outcomes Short Term: Able to explain why pulse checking is important during independent  exercise;Long Term: Able to check pulse independently and accurately       Understanding of Exercise Prescription Yes       Intervention Provide education, explanation, and written materials on patient's individual exercise prescription       Expected Outcomes Short Term: Able to explain program exercise prescription;Long Term: Able to explain home exercise prescription to exercise independently          Exercise Goals Re-Evaluation :  Exercise Goals Re-Evaluation     Row Name 07/04/24 0911 07/09/24 1514 07/23/24 0934 08/06/24 1423 08/22/24 1117     Exercise Goal Re-Evaluation   Exercise Goals Review Increase Physical Activity;Able to understand and use rate of perceived exertion (RPE) scale;Knowledge and understanding of Target Heart Rate Range (THRR);Understanding of Exercise Prescription;Increase Strength and Stamina;Able to understand and use Dyspnea scale;Able to check pulse independently Increase Physical Activity;Increase Strength and Stamina;Understanding of Exercise Prescription Increase Physical Activity;Increase Strength and Stamina;Understanding of Exercise Prescription Increase Physical Activity;Increase Strength and Stamina;Understanding of Exercise Prescription Increase Physical Activity;Increase Strength and Stamina;Understanding of Exercise Prescription   Comments Reviewed RPE and dyspnea scale, THR and program prescription with pt today.  Pt voiced understanding and was given a copy of goals to take home. Velinda is off to a good start in the program. He did well on the treadmill at a speed of 2.5 mph with no incline. He also did well on the T4 nustep  at level 6. We will continue to monitor his progress in the program. Velinda has only attended rehab once since the last review. During his one session he did increase his treadmill speed to 2.7 mph with no incline. He also improved to level 5 on the T5 nustep. We will continue to monitor his progress in the program. Velinda has only attended two  sessions of rehab since the last review. During his one session he did increase his treadmill speed to 2.8 mph with no incline. He also improved to level 4 on the XR. We will continue to monitor his progress in the program. Velinda has not attended rehab since the last review as he has been placed on medical hold. He informed staff that he will be out of town and then he will have surgery. We will continue to monitor his progress when he returns to the program.   Expected Outcomes Short: Use RPE daily to regulate intensity.  Long: Follow program prescription in THR. Short: Continue to follow current exercise prescription. Long: Continue exercise to improve strength and stamina. Short: Attend rehab more consistently. Long: Continue exercise to improve strength and stamina. Short: Attend rehab more consistently. Long: Continue exercise to improve strength and stamina. Short: Return to rehab when appropriate. Long: Graduate.    Row Name 09/04/24 1129 09/17/24 1429 10/01/24 1526 10/08/24 0949       Exercise Goal Re-Evaluation   Exercise Goals Review Increase Physical Activity;Increase Strength and Stamina;Understanding of Exercise Prescription Increase Physical Activity;Increase Strength and Stamina;Understanding of Exercise Prescription Increase Physical Activity;Increase Strength and Stamina;Understanding of Exercise Prescription --    Comments Velinda has not attended rehab since the last review as he continues to be on medical hold. He plans to return to the program on 09/12/2024. We will continue to monitor his progress when he returns to the program. Tim returned to rehab for one session since the last review. He did well on the treadmill at 2.5 mph with no incline. He also began using the rower at level 10. We will continue to monitor his progress in the program. Tim only attended two sessions of rehab since the last review. He increased his treadmill workload by adding a 1% incline while maintaining a speed of  2.5 mph. He also improved to level 9 on the XR. We will continue to monitor his progress in the program. Velinda is walking some at home as well as attending rehab. Encouraged him to attend rehab regularly.    Expected Outcomes Short: Return to rehab when appropriate. Long: Graduate. Short: Attend rehab more consistently. Long: Continue exercise to improve strength and stamina. Short: Attend rehab more consistently. Long: Continue exercise to improve strength and stamina. STG: Attend rehab more consistnetly and increase workload as able. LTG: Continue exercise to improve strength and stamina.       Discharge Exercise Prescription (Final Exercise Prescription Changes):  Exercise Prescription Changes - 10/01/24 1500       Response to Exercise   Blood Pressure (Admit) 132/54    Blood Pressure (Exercise) 150/84    Blood Pressure (Exit) 118/70    Heart Rate (Admit) 64 bpm    Heart Rate (Exercise) 99 bpm    Heart Rate (Exit) 74 bpm    Rating of Perceived Exertion (Exercise) 12    Symptoms none    Duration Continue with 30 min of aerobic exercise without signs/symptoms of physical distress.    Intensity THRR unchanged      Progression   Progression  Continue to progress workloads to maintain intensity without signs/symptoms of physical distress.    Average METs 4.3      Resistance Training   Training Prescription Yes    Weight 10 lb    Reps 10-15      Interval Training   Interval Training No      Treadmill   MPH 2.5    Grade 1    Minutes 15    METs 3.26      REL-XR   Level 9    Minutes 15    METs 5.8      Rower   Level 10    Watts 47    Minutes 15    METs 5      Oxygen   Maintain Oxygen Saturation 88% or higher          Nutrition:  Target Goals: Understanding of nutrition guidelines, daily intake of sodium 1500mg , cholesterol 200mg , calories 30% from fat and 7% or less from saturated fats, daily to have 5 or more servings of fruits and vegetables.  Education:  Nutrition 1 -Group instruction provided by verbal, written material, interactive activities, discussions, models, and posters to present general guidelines for heart healthy nutrition including macronutrients, label reading, and promoting whole foods over processed counterparts. Education serves as pensions consultant of discussion of heart healthy eating for all. Written material provided at class time.    Education: Nutrition 2 -Group instruction provided by verbal, written material, interactive activities, discussions, models, and posters to present general guidelines for heart healthy nutrition including sodium, cholesterol, and saturated fat. Providing guidance of habit forming to improve blood pressure, cholesterol, and body weight. Written material provided at class time.     Biometrics:  Pre Biometrics - 06/27/24 1015       Pre Biometrics   Height 6' 1.7 (1.872 m)    Weight 240 lb 8 oz (109.1 kg)    Waist Circumference 46.3 inches    Hip Circumference 43 inches    Waist to Hip Ratio 1.08 %    BMI (Calculated) 31.13    Single Leg Stand 10 seconds           Nutrition Therapy Plan and Nutrition Goals:  Nutrition Therapy & Goals - 06/27/24 1017       Nutrition Therapy   RD appointment deferred Yes      Personal Nutrition Goals   Nutrition Goal RD appointment deferred at this time      Intervention Plan   Intervention Prescribe, educate and counsel regarding individualized specific dietary modifications aiming towards targeted core components such as weight, hypertension, lipid management, diabetes, heart failure and other comorbidities.    Expected Outcomes Short Term Goal: Understand basic principles of dietary content, such as calories, fat, sodium, cholesterol and nutrients.          Nutrition Assessments:  MEDIFICTS Score Key: >=70 Need to make dietary changes  40-70 Heart Healthy Diet <= 40 Therapeutic Level Cholesterol Diet  Flowsheet Row Cardiac Rehab from  06/27/2024 in [facility] Cardiac and Pulmonary Rehab  Picture Your Plate Total Score on Admission 60   Picture Your Plate Scores: <59 Unhealthy dietary pattern with much room for improvement. 41-50 Dietary pattern unlikely to meet recommendations for good health and room for improvement. 51-60 More healthful dietary pattern, with some room for improvement.  >60 Healthy dietary pattern, although there may be some specific behaviors that could be improved.    Nutrition Goals Re-Evaluation:  Nutrition Goals Re-Evaluation  Row Name 10/08/24 0954             Goals   Comment RD appointment deferred at this time          Nutrition Goals Discharge (Final Nutrition Goals Re-Evaluation):  Nutrition Goals Re-Evaluation - 10/08/24 0954       Goals   Comment RD appointment deferred at this time          Psychosocial: Target Goals: Acknowledge presence or absence of significant depression and/or stress, maximize coping skills, provide positive support system. Participant is able to verbalize types and ability to use techniques and skills needed for reducing stress and depression.   Education: Stress, Anxiety, and Depression - Group verbal and visual presentation to define topics covered.  Reviews how body is impacted by stress, anxiety, and depression.  Also discusses healthy ways to reduce stress and to treat/manage anxiety and depression. Written material provided at class time.   Education: Sleep Hygiene -Provides group verbal and written instruction about how sleep can affect your health.  Define sleep hygiene, discuss sleep cycles and impact of sleep habits. Review good sleep hygiene tips.   Initial Review & Psychosocial Screening:  Initial Psych Review & Screening - 06/26/24 0957       Initial Review   Current issues with None Identified      Family Dynamics   Good Support System? 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 Tim stated that he does not have any stressors and has a good support system of his wife and family. Patient stated he particularly enjoys his time with his 10 grandchildren.Tim was pleasant to speak with and stated that he enjoys yard work and remains active. The patient stated he does have back pain/issues, but other than that manages well with his physical and mental health.      Barriers   Psychosocial barriers to participate in program The patient should benefit from training in stress management and relaxation.      Screening Interventions   Interventions To provide support and resources with identified psychosocial needs;Provide feedback about the scores to participant;Encouraged to exercise    Expected Outcomes Short Term goal: Identification and review with participant of any Quality of Life or Depression concerns found by scoring the questionnaire.;Long Term goal: The participant improves quality of Life and PHQ9 Scores as seen by post scores and/or verbalization of changes          Quality of Life Scores:   Quality of Life - 06/27/24 1017       Quality of Life   Select Quality of Life      Quality of Life Scores   Health/Function Pre 12.73 %    Socioeconomic Pre 12.69 %    Psych/Spiritual Pre 12.79 %    Family Pre 13.1 %    GLOBAL Pre 12.79 %         Scores of 19 and below usually indicate a poorer quality of life in these areas.  A difference of  2-3 points is a clinically meaningful difference.  A difference of 2-3 points in the total score of the Quality of Life Index has been associated with significant improvement in overall quality of life, self-image, physical symptoms, and general health in studies assessing change in quality of life.  PHQ-9: Review Flowsheet  More data exists      06/27/2024 02/12/2024 10/05/2023 09/15/2023 09/01/2023  Depression  screen PHQ 2/9  Decreased Interest 0 0 0 0 0  Down, Depressed, Hopeless 0 0 0 0 0  PHQ - 2  Score 0 0 0 0 0  Altered sleeping 0 0 0 0 0  Tired, decreased energy 0 1 0 0 0  Change in appetite 0 0 0 0 0  Feeling bad or failure about yourself  0 0 0 0 0  Trouble concentrating 0 0 0 0 0  Moving slowly or fidgety/restless 0 0 0 0 0  Suicidal thoughts 0 0 0 0 0  PHQ-9 Score 0  1  0  0  0   Difficult doing work/chores Not difficult at all Not difficult at all Not difficult at all Not difficult at all Not difficult at all    Details       Data saved with a previous flowsheet row definition        Interpretation of Total Score  Total Score Depression Severity:  1-4 = Minimal depression, 5-9 = Mild depression, 10-14 = Moderate depression, 15-19 = Moderately severe depression, 20-27 = Severe depression   Psychosocial Evaluation and Intervention:  Psychosocial Evaluation - 06/26/24 0958       Psychosocial Evaluation & Interventions   Interventions Stress management education;Relaxation education;Encouraged to exercise with the program and follow exercise prescription    Comments 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 Tim stated that he does not have any stressors and has a good support system of his wife and family. Patient stated he particularly enjoys his time with his 10 grandchildren.Tim was pleasant to speak with and stated that he enjoys yard work and remains active. The patient stated he does have back pain/issues, but other than that manages well with his physical and mental health.    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 He esitmates he gets 6hr of sleep most nights. He denies any stress, anxiety or depression at this time.       Expected Outcomes STG: Continue to attend rehab and practice good sleep hygiene. 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 He esitmates he gets 6hr of sleep most nights. He denies any stress, anxiety or depression at this time.    Expected Outcomes STG: Continue to attend rehab and practice good sleep hygiene. LTG: Achieve and maintain positive outlook on health and daily life    Continue Psychosocial Services  Follow up required by staff  Vocational Rehabilitation: Provide vocational rehab assistance to qualifying candidates.   Vocational Rehab Evaluation & Intervention:  Vocational Rehab - 06/26/24 0957       Initial Vocational Rehab Evaluation & Intervention   Assessment shows need for Vocational Rehabilitation No          Education: Education Goals: Education classes will be provided on a variety of topics geared toward better understanding of heart health and risk factor modification. Participant will state understanding/return demonstration of topics presented as noted by education test scores.  Learning Barriers/Preferences:  Learning Barriers/Preferences - 06/26/24 0957       Learning Barriers/Preferences   Learning Barriers None    Learning Preferences Group Instruction;Individual Instruction;Verbal Instruction;Video;Written Material          General Cardiac Education Topics:  AED/CPR: - Group verbal and  written instruction with the use of models to demonstrate the basic use of the AED with the basic ABC's of resuscitation.   Test and Procedures: - Group verbal and visual presentation and models provide information about basic cardiac anatomy and function. Reviews the testing methods done to diagnose heart disease and the outcomes of the test results. Describes the treatment choices: Medical Management, Angioplasty, or Coronary Bypass Surgery for treating various heart conditions including Myocardial Infarction, Angina, Valve Disease, and Cardiac Arrhythmias. Written material provided at class time.   Medication Safety: - Group verbal and visual instruction to review commonly prescribed medications for heart and lung disease. Reviews the medication, class of the drug, and side effects. Includes the steps to properly store meds and maintain the prescription regimen. Written material provided at class time.   Intimacy: - Group verbal instruction through game format to discuss how heart and lung disease can affect sexual intimacy. Written material provided at class time.   Know Your Numbers and Heart Failure: - Group verbal and visual instruction to discuss disease risk factors for cardiac and pulmonary disease and treatment options.  Reviews associated critical values for Overweight/Obesity, Hypertension, Cholesterol, and Diabetes.  Discusses basics of heart failure: signs/symptoms and treatments.  Introduces Heart Failure Zone chart for action plan for heart failure. Written material provided at class time.   Infection Prevention: - Provides verbal and written material to individual with discussion of infection control including proper hand washing and proper equipment cleaning during exercise session. Flowsheet Row Cardiac Rehab from 06/27/2024 in [facility] Cardiac and Pulmonary Rehab  Date 06/27/24  Educator MB  Instruction Review Code 1- Verbalizes Understanding    Falls Prevention: - Provides  verbal and written material to individual with discussion of falls prevention and safety. Flowsheet Row Cardiac Rehab from 06/27/2024 in [facility] Cardiac and Pulmonary Rehab  Date 06/27/24  Educator MB  Instruction Review Code 1- Verbalizes Understanding    Other: -Provides group and verbal instruction on various topics (see comments) Flowsheet Row Cardiac Rehab from 06/27/2024 in [facility] Cardiac and Pulmonary Rehab  Date 06/27/24  [facility] and Cardiac Procedures]    Knowledge Questionnaire Score:  Knowledge Questionnaire Score - 06/27/24 1018       Knowledge Questionnaire Score   Pre Score 23/26          Core Components/Risk Factors/Patient Goals at Admission:  Personal Goals and Risk Factors at Admission - 06/27/24 1019       Core Components/Risk Factors/Patient Goals on Admission    Weight Management Weight Loss;Yes    Intervention Weight Management: Develop a combined nutrition and exercise program designed to reach desired caloric intake, while maintaining appropriate intake of  nutrient and fiber, sodium and fats, and appropriate energy expenditure required for the weight goal.;Weight Management: Provide education and appropriate resources to help participant work on and attain dietary goals.;Weight Management/Obesity: Establish reasonable short term and long term weight goals.    Admit Weight 240 lb 8 oz (109.1 kg)    Goal Weight: Short Term 230 lb (104.3 kg)    Goal Weight: Long Term 220 lb (99.8 kg)    Expected Outcomes Short Term: Continue to assess and modify interventions until short term weight is achieved;Long Term: Adherence to nutrition and physical activity/exercise program aimed toward attainment of established weight goal;Weight Loss: Understanding of general recommendations for a balanced deficit meal plan, which promotes 1-2 lb weight loss per week and includes a negative energy balance of [phone] kcal/d;Understanding recommendations for meals to include 15-35% energy as  protein, 25-35% energy from fat, 35-60% energy from carbohydrates, less than 200mg  of dietary cholesterol, 20-35 gm of total fiber daily;Understanding of distribution of calorie intake throughout the day with the consumption of 4-5 meals/snacks    Hypertension Yes    Intervention Provide education on lifestyle modifcations including regular physical activity/exercise, weight management, moderate sodium restriction and increased consumption of fresh fruit, vegetables, and low fat dairy, alcohol moderation, and smoking cessation.;Monitor prescription use compliance.    Expected Outcomes Short Term: Continued assessment and intervention until BP is < 140/11mm HG in hypertensive participants. < 130/56mm HG in hypertensive participants with diabetes, heart failure or chronic kidney disease.;Long Term: Maintenance of blood pressure at goal levels.    Lipids Yes    Intervention Provide education and support for participant on nutrition & aerobic/resistive exercise along with prescribed medications to achieve LDL 70mg , HDL >40mg .    Expected Outcomes Short Term: Participant states understanding of desired cholesterol values and is compliant with medications prescribed. Participant is following exercise prescription and nutrition guidelines.;Long Term: Cholesterol controlled with medications as prescribed, with individualized exercise RX and with personalized nutrition plan. Value goals: LDL < 70mg , HDL > 40 mg.          Education:Diabetes - Individual verbal and written instruction to review signs/symptoms of diabetes, desired ranges of glucose level fasting, after meals and with exercise. Acknowledge that pre and post exercise glucose checks will be done for 3 sessions at entry of program.   Core Components/Risk Factors/Patient Goals Review:   Goals and Risk Factor Review     Row Name 10/08/24 0954             Core Components/Risk Factors/Patient Goals Review   Personal Goals Review Weight  Management/Obesity       Review Velinda reports he is still wanting to lose weight, he says the holidays have made it tough to stay on track. He has had bigger meals and more desserts than he normally would.       Expected Outcomes STG: Focus on getting eating habits back on track by new year. LTG: Manage risk factors independently          Core Components/Risk Factors/Patient Goals at Discharge (Final Review):   Goals and Risk Factor Review - 10/08/24 0954       Core Components/Risk Factors/Patient Goals Review   Personal Goals Review Weight Management/Obesity    Review Tim reports he is still wanting to lose weight, he says the holidays have made it tough to stay on track. He has had bigger meals and more desserts than he normally would.    Expected Outcomes STG: Focus on getting eating  habits back on track by new year. LTG: Manage risk factors independently          ITP Comments:  ITP Comments     Row Name 06/26/24 1001 06/27/24 1006 07/04/24 0910 07/17/24 1001 08/14/24 0825   ITP Comments Initial phone call completed. Diagnosis can be found in [facility] 06/12/2024. EP Orientation scheduled for Thursday, June 27, 2024, @ 0800. Completed and gym orientation for cardiac rehab. Initial ITP created and sent for review to Dr. Oneil Pinal, Medical Director. First full day of exercise!  Patient was oriented to gym and equipment including functions, settings, policies, and procedures.  Patient's individual exercise prescription and treatment plan were reviewed.  All starting workloads were established based on the results of the 6 minute walk test done at initial orientation visit.  The plan for exercise progression was also introduced and progression will be customized based on patient's performance and goals. 30 Day review completed. Medical Director ITP review done, changes made as directed, and signed approval by Medical Director. New to program. 30 Day review completed. Medical Director ITP  review done, changes made as directed, and signed approval by Medical Director.    Row Name 09/11/24 [phone] 10/09/24 0949         ITP Comments 30 Day review completed. Medical Director ITP review done, changes made as directed, and signed approval by Medical Director. 30 Day review completed. Medical Director ITP review done, changes made as directed, and signed approval by Medical Director.         Comments: 30 Day Review     [1]  Current Outpatient Medications:    acetaminophen  (TYLENOL ) 325 MG tablet, Take 650 mg by mouth every 6 (six) hours as needed for moderate pain (pain score 4-6)., Disp: , Rfl:    amiodarone (PACERONE) 200 MG tablet, Take 200 mg by mouth 2 (two) times daily., Disp: , Rfl:    apixaban  (ELIQUIS ) 5 MG TABS tablet, Take 1 tablet (5 mg total) by mouth 2 (two) times daily. Restart Wednesday, November 16 (Patient taking differently: Take 5 mg by mouth 2 (two) times daily.), Disp: 60 tablet, Rfl: 0   celecoxib  (CELEBREX ) 200 MG capsule, Take 200 mg by mouth daily. (Patient taking differently: Take 200 mg by mouth in the morning.), Disp: , Rfl:    clopidogrel (PLAVIX) 75 MG tablet, Take 75 mg by mouth in the morning., Disp: , Rfl:    diltiazem  (CARDIZEM  CD) 180 MG 24 hr capsule, Take 1 capsule (180 mg total) by mouth 2 (two) times daily. (Patient taking differently: Take 180 mg by mouth in the morning.), Disp: 180 capsule, Rfl: 1   ezetimibe (ZETIA) 10 MG tablet, Take 10 mg by mouth at bedtime., Disp: , Rfl:    fexofenadine  (ALLEGRA ) 180 MG tablet, Take 1 tablet (180 mg total) by mouth daily., Disp: 90 tablet, Rfl: 1   fluticasone  (FLONASE ) 50 MCG/ACT nasal spray, Place 2 sprays into both nostrils daily. (Patient taking differently: Place 2 sprays into both nostrils daily as needed for allergies or rhinitis.), Disp: 18 mL, Rfl: 5   methocarbamol  (ROBAXIN ) 500 MG tablet, Take 500 mg by mouth daily as needed for muscle spasms., Disp: , Rfl:    metoprolol  succinate (TOPROL -XL) 50  MG 24 hr tablet, Take 1 tablet (50 mg total) by mouth daily. cardio (Patient taking differently: Take 50 mg by mouth 2 (two) times daily. cardio), Disp: 90 tablet, Rfl: 1   montelukast  (SINGULAIR ) 10 MG tablet, Take 1 tablet (10  mg total) by mouth at bedtime., Disp: 90 tablet, Rfl: 1   nitroGLYCERIN (NITROSTAT) 0.4 MG SL tablet, Place 0.4 mg under the tongue every 5 (five) minutes as needed for chest pain., Disp: , Rfl:    pantoprazole  (PROTONIX ) 40 MG tablet, Take 1 tablet (40 mg total) by mouth every evening. (Patient taking differently: Take 40 mg by mouth in the morning.), Disp: 90 tablet, Rfl: 1   potassium chloride  SA (KLOR-CON  M20) 20 MEQ tablet, Take 1 tablet (20 mEq total) by mouth 3 (three) times daily. (Patient taking differently: Take 20-40 mEq by mouth See admin instructions. Take 20 meq in the morning and 40 meq in the evening), Disp: 270 tablet, Rfl: 1   rosuvastatin  (CRESTOR ) 5 MG tablet, TAKE 1 TABLET BY MOUTH THREE TIMES A WEEK (Patient taking differently: Take 5 mg by mouth See admin instructions. TAKE 1 TABLET BY MOUTH THREE TIMES A WEEK Sun, Tues, and  thurs, in the evening), Disp: 90 tablet, Rfl: 1   tadalafil  (CIALIS ) 10 MG tablet, Take 1 tablet (10 mg total) by mouth every other day. Okay to take an additional 10 mg boost dose as needed 45 minutes prior to sexual activity (Patient taking differently: Take 10 mg by mouth daily as needed for erectile dysfunction. Okay to take an additional 10 mg boost dose as needed 45 minutes prior to sexual activity), Disp: 90 tablet, Rfl: 3   tamsulosin  (FLOMAX ) 0.4 MG CAPS capsule, Take 1 capsule (0.4 mg total) by mouth daily., Disp: 90 capsule, Rfl: 3   traMADol  (ULTRAM ) 50 MG tablet, Take 50 mg by mouth every 6 (six) hours as needed., Disp: , Rfl:    triamterene-hydrochlorothiazide  (MAXZIDE-25) 37.5-25 MG tablet, Take 0.5 tablets by mouth in the morning., Disp: , Rfl:    valsartan  (DIOVAN ) 320 MG tablet, Take 1 tablet (320 mg total) by mouth  daily. (Patient taking differently: Take 160 mg by mouth at bedtime.), Disp: 90 tablet, Rfl: 1 [2]  Social History Tobacco Use  Smoking Status Never  Smokeless Tobacco Former   Types: Chew   Quit date: 03/2020

## 2024-10-10 ENCOUNTER — Encounter

## 2024-10-15 ENCOUNTER — Encounter

## 2024-10-15 DIAGNOSIS — Z955 Presence of coronary angioplasty implant and graft: Secondary | ICD-10-CM

## 2024-10-15 NOTE — Progress Notes (Signed)
 Daily Session Note  Patient Details  Name: [name] MRN: [id number] Date of Birth: 1955-11-26 Referring Provider:   Flowsheet Row Cardiac Rehab from 06/27/2024 in [facility] Cardiac and Pulmonary Rehab  Referring Provider Florencio Kava, MD    Encounter Date: 10/15/2024  Check In:  Session Check In - 10/15/24 0905       Check-In   Supervising physician immediately available to respond to emergencies See telemetry face sheet for immediately available ER MD    Location [facility]-Cardiac & Pulmonary Rehab    Staff Present Burnard Davenport RN,BSN,MPA;Laura Cates RN,BSN;Kristen Coble RN,BC,MSN;Jason Elnor [facility]    Virtual Visit No    Medication changes reported     No    Fall or balance concerns reported    No    Warm-up and Cool-down Performed on first and last piece of equipment    Resistance Training Performed Yes    VAD Patient? No    PAD/SET Patient? No      Pain Assessment   Currently in Pain? No/denies             Tobacco Use History[1]  Goals Met:  Independence with exercise equipment Exercise tolerated well No report of concerns or symptoms today Strength training completed today  Goals Unmet:  Not Applicable  Comments: Pt able to follow exercise prescription today without complaint.  Will continue to monitor for progression.    Dr. Oneil Pinal is Medical Director for [facility] Cardiac Rehabilitation.  Dr. Fuad Aleskerov is Medical Director for [facility] Pulmonary Rehabilitation.    [1]  Social History Tobacco Use  Smoking Status Never  Smokeless Tobacco Former   Types: Chew   Quit date: 03/2020

## 2024-10-22 ENCOUNTER — Encounter

## 2024-10-22 ENCOUNTER — Other Ambulatory Visit: Payer: Self-pay | Admitting: Internal Medicine

## 2024-10-22 ENCOUNTER — Encounter: Payer: Self-pay | Admitting: Internal Medicine

## 2024-10-22 DIAGNOSIS — R590 Localized enlarged lymph nodes: Secondary | ICD-10-CM

## 2024-10-22 DIAGNOSIS — Z955 Presence of coronary angioplasty implant and graft: Secondary | ICD-10-CM

## 2024-10-22 DIAGNOSIS — R2231 Localized swelling, mass and lump, right upper limb: Secondary | ICD-10-CM

## 2024-10-22 NOTE — Progress Notes (Signed)
 Daily Session Note  Patient Details  Name: [name] MRN: [id number] Date of Birth: 1956/07/07 Referring Provider:   Flowsheet Row Cardiac Rehab from 06/27/2024 in [facility] Cardiac and Pulmonary Rehab  Referring Provider Florencio Kava, MD    Encounter Date: 10/22/2024  Check In:  Session Check In - 10/22/24 0920       Check-In   Supervising physician immediately available to respond to emergencies See telemetry face sheet for immediately available ER MD    Location [facility]-Cardiac & Pulmonary Rehab    Staff Present Burnard Davenport RN,BSN,MPA;Meredith Tressa RN,BSN;Maxon Burnell BS, Exercise Physiologist;Margaret Best, MS, Exercise Physiologist    Virtual Visit No    Medication changes reported     No    Fall or balance concerns reported    No    Warm-up and Cool-down Performed on first and last piece of equipment    Resistance Training Performed Yes    VAD Patient? No    PAD/SET Patient? No      Pain Assessment   Currently in Pain? No/denies             Tobacco Use History[1]  Goals Met:  Independence with exercise equipment Exercise tolerated well No report of concerns or symptoms today Strength training completed today  Goals Unmet:  Not Applicable  Comments: Pt able to follow exercise prescription today without complaint.  Will continue to monitor for progression.    Dr. Oneil Pinal is Medical Director for [facility] Cardiac Rehabilitation.  Dr. Fuad Aleskerov is Medical Director for [facility] Pulmonary Rehabilitation.    [1]  Social History Tobacco Use  Smoking Status Never  Smokeless Tobacco Former   Types: Chew   Quit date: 03/2020

## 2024-10-28 DIAGNOSIS — N401 Enlarged prostate with lower urinary tract symptoms: Secondary | ICD-10-CM

## 2024-10-29 ENCOUNTER — Encounter

## 2024-10-31 ENCOUNTER — Other Ambulatory Visit

## 2024-10-31 ENCOUNTER — Encounter: Attending: Internal Medicine | Admitting: Emergency Medicine

## 2024-10-31 DIAGNOSIS — Z48812 Encounter for surgical aftercare following surgery on the circulatory system: Secondary | ICD-10-CM | POA: Diagnosis not present

## 2024-10-31 DIAGNOSIS — Z955 Presence of coronary angioplasty implant and graft: Secondary | ICD-10-CM | POA: Insufficient documentation

## 2024-10-31 DIAGNOSIS — N138 Other obstructive and reflux uropathy: Secondary | ICD-10-CM

## 2024-10-31 NOTE — Progress Notes (Signed)
 Daily Session Note  Patient Details  Name: [name] MRN: [id number] Date of Birth: December 24, 1955 Referring Provider:   Flowsheet Row Cardiac Rehab from 06/27/2024 in [facility] Cardiac and Pulmonary Rehab  Referring Provider Florencio Kava, MD    Encounter Date: 10/31/2024  Check In:  Session Check In - 10/31/24 0931       Check-In   Supervising physician immediately available to respond to emergencies See telemetry face sheet for immediately available ER MD    Location [facility]-Cardiac & Pulmonary Rehab    Staff Present Leita Franks RN,BSN;[facility] [place], [place], Exercise Physiologist    Virtual Visit No    Medication changes reported     No    Fall or balance concerns reported    No    Warm-up and Cool-down Performed on first and last piece of equipment    Resistance Training Performed Yes    VAD Patient? No    PAD/SET Patient? No      Pain Assessment   Currently in Pain? No/denies             Tobacco Use History[1]  Goals Met:  Independence with exercise equipment Exercise tolerated well No report of concerns or symptoms today Strength training completed today  Goals Unmet:  Not Applicable  Comments: Pt able to follow exercise prescription today without complaint.  Will continue to monitor for progression.    Dr. Oneil Pinal is Medical Director for [facility] Cardiac Rehabilitation.  Dr. Fuad Aleskerov is Medical Director for [facility] Pulmonary Rehabilitation.    [1]  Social History Tobacco Use  Smoking Status Never  Smokeless Tobacco Former   Types: Chew   Quit date: 03/2020

## 2024-11-01 LAB — PSA TOTAL (REFLEX TO FREE): Prostate Specific Ag, Serum: 2.3 ng/mL (ref 0.0–4.0)

## 2024-11-04 ENCOUNTER — Other Ambulatory Visit: Payer: Self-pay | Admitting: Internal Medicine

## 2024-11-04 ENCOUNTER — Ambulatory Visit
Admission: RE | Admit: 2024-11-04 | Discharge: 2024-11-04 | Disposition: A | Source: Ambulatory Visit | Attending: Internal Medicine | Admitting: Internal Medicine

## 2024-11-04 ENCOUNTER — Inpatient Hospital Stay
Admission: RE | Admit: 2024-11-04 | Discharge: 2024-11-04 | Disposition: A | Source: Ambulatory Visit | Attending: Internal Medicine | Admitting: Internal Medicine

## 2024-11-04 DIAGNOSIS — R2231 Localized swelling, mass and lump, right upper limb: Secondary | ICD-10-CM

## 2024-11-04 DIAGNOSIS — R590 Localized enlarged lymph nodes: Secondary | ICD-10-CM

## 2024-11-04 MED ORDER — IOPAMIDOL (ISOVUE-300) INJECTION 61%
100.0000 mL | Freq: Once | INTRAVENOUS | Status: AC | PRN
  Administered 2024-11-04: 100 mL via INTRAVENOUS

## 2024-11-05 ENCOUNTER — Ambulatory Visit: Admitting: Urology

## 2024-11-05 ENCOUNTER — Encounter

## 2024-11-05 VITALS — BP 196/90 | HR 68 | Ht 73.0 in | Wt 240.0 lb

## 2024-11-05 DIAGNOSIS — N138 Other obstructive and reflux uropathy: Secondary | ICD-10-CM | POA: Diagnosis not present

## 2024-11-05 DIAGNOSIS — N529 Male erectile dysfunction, unspecified: Secondary | ICD-10-CM

## 2024-11-05 DIAGNOSIS — Z48812 Encounter for surgical aftercare following surgery on the circulatory system: Secondary | ICD-10-CM | POA: Diagnosis not present

## 2024-11-05 DIAGNOSIS — Z955 Presence of coronary angioplasty implant and graft: Secondary | ICD-10-CM

## 2024-11-05 DIAGNOSIS — R351 Nocturia: Secondary | ICD-10-CM

## 2024-11-05 DIAGNOSIS — Z125 Encounter for screening for malignant neoplasm of prostate: Secondary | ICD-10-CM | POA: Diagnosis not present

## 2024-11-05 DIAGNOSIS — N401 Enlarged prostate with lower urinary tract symptoms: Secondary | ICD-10-CM | POA: Diagnosis not present

## 2024-11-05 MED ORDER — TAMSULOSIN HCL 0.4 MG PO CAPS: 0.4000 mg | ORAL_CAPSULE | Freq: Every day | ORAL | 3 refills | Status: AC

## 2024-11-05 MED ORDER — TADALAFIL 10 MG PO TABS: 10.0000 mg | ORAL_TABLET | Freq: Every day | ORAL | 3 refills | Status: AC | PRN

## 2024-11-05 NOTE — Progress Notes (Signed)
 [name] presents for an office/procedure visit. BP today is 199/104. He is complaint with BP medication. Greater than 140/90. Provider notified.  HX of MI and recent stent placement. Pt advised to f/u with PCP. Pt voiced understanding also stated he was heading to Cardiac Rehab after this Urology appointment.

## 2024-11-05 NOTE — Progress Notes (Signed)
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  Follow-up imaging has showed no hydronephrosis and renal function has been normal.  He continues to follow with Dr. 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  PSA has down trended to normal on follow-up BPH well-managed on daily Flomax  ED well-managed on 10 mg Cialis  as needed  Physical Exam: BP (!) 199/104 (BP Location: Left Arm, Patient Position: Sitting, Cuff Size: Large)   Pulse 68   Ht 6' 1 (1.854 m)   Wt 240 lb (108.9 kg)   SpO2 98%   BMI 31.66 kg/m   Imaging/labs: PSA January 2026 normal at 2.3, stable from 3.5 last year, and 2.4 2021 Creatinine normal, 0.9, eGFR greater than 60 I personally viewed and interpreted the CT abdomen and pelvis from yesterday ordered for lymphadenopathy, bladder consistent with prior psoas hitch, no left hydronephrosis  Today: CAD with cardiac catheterization and stent placement July 2025, on dual anticoagulation with Plavix and Eliquis  x 12 months No urinary complaints on the daily Flomax , PVRs have always been normal 10 mg Cialis  as needed working well Denies any left-sided flank pain  Plan:   Left distal ureteral stricture: Repaired robotically with reimplant by Dr. Alvaro in 2022, no problems since that time, no hydronephrosis on recent imaging, renal function normal.  Continues to follow yearly with Dr. Alvaro ED: Well-controlled on  Cialis  10 mg as needed, refilled BPH: Well-controlled on Flomax  0.4 mg daily, refilled PSA screening: History of negative biopsy in May 2020, PSA has been normal in the last few years, including most recently 2.3.  Risks and benefits of screening reviewed, very reasonable to discontinue screening at this time Medications refilled, RTC 1 year PVR   Justin JAYSON Burnet, MD  [facility] [address], [address] [place], [place] [address] [phone]  "

## 2024-11-05 NOTE — Progress Notes (Signed)
 Daily Session Note  Patient Details  Name: [name] MRN: [id number] Date of Birth: [birth year] Referring Provider:   Flowsheet Row Cardiac Rehab from 06/27/2024 in [facility]  Referring Provider Florencio Kava, MD    Encounter Date: 11/05/2024  Check In:  Session Check In - 11/05/24 0906       Check-In   Supervising physician immediately available to respond to emergencies See telemetry face sheet for immediately available ER MD    Location [facility]-Cardiac & Pulmonary Rehab    Staff Present Burnard Davenport RN,BSN,MPA;Maxon Conetta BS, Exercise Physiologist;Margaret Best, MS, Exercise Physiologist;Jason Elnor RDN,LDN    Virtual Visit No    Medication changes reported     No    Fall or balance concerns reported    No    Warm-up and Cool-down Performed on first and last piece of equipment    Resistance Training Performed Yes    VAD Patient? No    PAD/SET Patient? No      Pain Assessment   Currently in Pain? No/denies             Tobacco Use History[1]  Goals Met:  Independence with exercise equipment Exercise tolerated well No report of concerns or symptoms today Strength training completed today  Goals Unmet:  Not Applicable  Comments: Pt able to follow exercise prescription today without complaint.  Will continue to monitor for progression.    Dr. Oneil Pinal is Medical Director for [facility] Cardiac Rehabilitation.  Dr. Fuad Aleskerov is Medical Director for [facility] Pulmonary Rehabilitation.    [1]  Social History Tobacco Use  Smoking Status Never  Smokeless Tobacco Former   Types: Chew   Quit date: 03/2020

## 2024-11-06 ENCOUNTER — Ambulatory Visit: Payer: Self-pay | Admitting: Urology

## 2024-11-06 DIAGNOSIS — Z955 Presence of coronary angioplasty implant and graft: Secondary | ICD-10-CM

## 2024-11-06 NOTE — Progress Notes (Signed)
 Cardiac Individual Treatment Plan  Patient Details  Name: [name] MRN: [id number] Date of Birth: [birth year] Referring Provider:   Flowsheet Row Cardiac Rehab from 06/27/2024 in [facility] Cardiac and Pulmonary Rehab  Referring Provider Florencio Kava, MD    Initial Encounter Date:  Flowsheet Row Cardiac Rehab from 06/27/2024 in [facility] Cardiac and Pulmonary Rehab  Date 06/27/24    Visit Diagnosis: Status post coronary artery stent placement  Patient's Home Medications on Admission: Current Medications[1]  Past Medical History: Past Medical History:  Diagnosis Date   Acquired deafness, right    per pt completed deafness right ear since age 77   Allergic rhinitis, seasonal    Anticoagulant long-term use    eliquis --- managed by cardiology   BPH associated with nocturia    urologist--- dr alvaro   Cancer [facility])    basal and squamous cell   Chronic low back pain    DDD (degenerative disc disease), lumbosacral    Foraminal stenosis of lumbosacral region    GERD (gastroesophageal reflux disease)    Heart murmur    History of kidney stones    History of skin cancer    Hydronephrosis, left    Hyperlipidemia, mixed    Hypertension, essential    followed by pcp and cardiology   Hypokalemia    OA (osteoarthritis)    Paroxysmal atrial fibrillation (HCC) 01/28/2017   cardiologist--- dr hester;   pt had ETT 03-16-2017 per cardio note normal, event monitor 02-09-2017 showed baseline SR some peroids  Afib with controlled rate with occ PAC/ PVC;  per pt had echo many yrs ago prior to AFib    Tobacco Use: Tobacco Use History[2]  Labs: Review Flowsheet  More data exists      Latest Ref Rng & Units 06/15/2022 08/24/2022 03/01/2023 09/15/2023 02/13/2024  Labs for ITP Cardiac and Pulmonary Rehab  Cholestrol 100 - 199 mg/dL 871  - 865  785  843   LDL (calc) 0 - 99 mg/dL 67  - 78  853  97   HDL-C >39 mg/dL 46  - 40  43  43   Trlycerides 0 - 149 mg/dL 76  - 84  859  83   Hemoglobin A1c  4.8 - 5.6 % - 6.1  6.4  - -     Exercise Target Goals: Exercise Program Goal: Individual exercise prescription set using results from initial 6 min walk test and THRR while considering  patients activity barriers and safety.   Exercise Prescription Goal: Initial exercise prescription builds to 30-45 minutes a day of aerobic activity, 2-3 days per week.  Home exercise guidelines will be given to patient during program as part of exercise prescription that the participant will acknowledge.   Education: Aerobic Exercise: - Group verbal and visual presentation on the components of exercise prescription. Introduces F.I.T.T principle from ACSM for exercise prescriptions.  Reviews F.I.T.T. principles of aerobic exercise including progression. Written material provided at class time.   Education: Resistance Exercise: - Group verbal and visual presentation on the components of exercise prescription. Introduces F.I.T.T principle from ACSM for exercise prescriptions  Reviews F.I.T.T. principles of resistance exercise including progression. Written material provided at class time.    Education: Exercise & Equipment Safety: - Individual verbal instruction and demonstration of equipment use and safety with use of the equipment. Flowsheet Row Cardiac Rehab from 06/27/2024 in [facility] Cardiac and Pulmonary Rehab  Date 06/27/24  Educator MB  Instruction Review Code 1- [employer] Understanding    Education: Exercise  Physiology & General Exercise Guidelines: - Group verbal and written instruction with models to review the exercise physiology of the cardiovascular system and associated critical values. Provides general exercise guidelines with specific guidelines to those with heart or lung disease. Written material provided at class time. Flowsheet Row Cardiac Rehab from 06/27/2024 in [facility] Cardiac and Pulmonary Rehab  Education need identified 06/27/24    Education: Flexibility, Balance, Mind/Body  Relaxation: - Group verbal and visual presentation with interactive activity on the components of exercise prescription. Introduces F.I.T.T principle from ACSM for exercise prescriptions. Reviews F.I.T.T. principles of flexibility and balance exercise training including progression. Also discusses the mind body connection.  Reviews various relaxation techniques to help reduce and manage stress (i.e. Deep breathing, progressive muscle relaxation, and visualization). Balance handout provided to take home. Written material provided at class time.   Activity Barriers & Risk Stratification:  Activity Barriers & Cardiac Risk Stratification - 06/27/24 1007       Activity Barriers & Cardiac Risk Stratification   Activity Barriers Back Problems;Other (comment)    Comments R leg    Cardiac Risk Stratification Moderate          6 Minute Walk:  6 Minute Walk     Row Name 06/27/24 1006         6 Minute Walk   Phase Initial     Distance 1300 feet     Walk Time 6 minutes     # of Rest Breaks 0     MPH 2.46     METS 3.27     RPE 11     Perceived Dyspnea  0     VO2 Peak 11.44     Symptoms No     Resting HR 62 bpm     Resting BP 154/82     Resting Oxygen Saturation  97 %     Exercise Oxygen Saturation  during 6 min walk 95 %     Max Ex. HR 112 bpm     Max Ex. BP 180/92     2 Minute Post BP 168/90        Oxygen Initial Assessment:   Oxygen Re-Evaluation:   Oxygen Discharge (Final Oxygen Re-Evaluation):   Initial Exercise Prescription:  Initial Exercise Prescription - 06/27/24 1000       Date of Initial Exercise RX and Referring Provider   Date 06/27/24    Referring Provider Florencio Kava, MD      Oxygen   Maintain Oxygen Saturation 88% or higher      Treadmill   MPH 2.5    Grade 0    Minutes 15    METs 2.91      REL-XR   Level 2    Speed 50    Minutes 15    METs 3.27      T5 Nustep   Level 3    SPM 80    Minutes 15    METs 3.27      Biostep-RELP    Level 3    SPM 50    Minutes 15    METs 3.27      Prescription Details   Frequency (times per week) 2    Duration Progress to 30 minutes of continuous aerobic without signs/symptoms of physical distress      Intensity   THRR 40-80% of Max Heartrate 97-132    Ratings of Perceived Exertion 11-13    Perceived Dyspnea 0-4      Progression  Progression Continue to progress workloads to maintain intensity without signs/symptoms of physical distress.      Resistance Training   Training Prescription Yes    Weight 10 lb    Reps 10-15          Perform Capillary Blood Glucose checks as needed.  Exercise Prescription Changes:   Exercise Prescription Changes     Row Name 06/27/24 1000 07/09/24 1500 07/23/24 0900 08/06/24 1400 09/17/24 1400     Response to Exercise   Blood Pressure (Admit) 154/82 114/68 118/82 118/78 118/76   Blood Pressure (Exercise) 180/92 136/70 122/70 160/80 128/74   Blood Pressure (Exit) 150/90 112/70 128/80 118/72 140/80   Heart Rate (Admit) 62 bpm 63 bpm 56 bpm 64 bpm 63 bpm   Heart Rate (Exercise) 112 bpm 85 bpm 90 bpm 102 bpm 86 bpm   Heart Rate (Exit) 61 bpm 58 bpm 65 bpm 78 bpm 61 bpm   Oxygen Saturation (Admit) 97 % -- -- -- --   Oxygen Saturation (Exercise) 95 % -- -- -- --   Oxygen Saturation (Exit) 96 % -- -- -- --   Rating of Perceived Exertion (Exercise) 11 13 11 12 12    Perceived Dyspnea (Exercise) 0 -- -- -- --   Symptoms none none none none none   Comments results First full day of exercise -- -- --   Duration -- Continue with 30 min of aerobic exercise without signs/symptoms of physical distress. Continue with 30 min of aerobic exercise without signs/symptoms of physical distress. Continue with 30 min of aerobic exercise without signs/symptoms of physical distress. Continue with 30 min of aerobic exercise without signs/symptoms of physical distress.   Intensity -- THRR unchanged THRR unchanged THRR unchanged THRR unchanged      Progression   Progression -- Continue to progress workloads to maintain intensity without signs/symptoms of physical distress. Continue to progress workloads to maintain intensity without signs/symptoms of physical distress. Continue to progress workloads to maintain intensity without signs/symptoms of physical distress. Continue to progress workloads to maintain intensity without signs/symptoms of physical distress.   Average METs 3.27 3.76 3.09 2.87 3.91     Resistance Training   Training Prescription -- Yes Yes Yes Yes   Weight -- 10 lb 10 lb 10 lb 10 lb   Reps -- 10-15 10-15 10-15 10-15     Interval Training   Interval Training -- No No No No     Treadmill   MPH -- 2.5 2.7 2.8 2.5   Grade -- 0 0 0 0   Minutes -- 15 15 15 15    METs -- 2.91 3.07 3.14 2.91     NuStep   Level -- 6 -- 4  T6 nustep --   Minutes -- 15 -- 15 --   METs -- 4.6 -- 2.4 --     REL-XR   Level -- -- -- 4 --   Minutes -- -- -- 15 --     T5 Nustep   Level -- -- 5 -- --   Minutes -- -- 15 -- --   METs -- -- 3.1 -- --     Rower   Level -- -- -- -- 10   Watts -- -- -- -- 43   Minutes -- -- -- -- 15   METs -- -- -- -- 4.91     Oxygen   Maintain Oxygen Saturation -- 88% or higher 88% or higher 88% or higher 88% or higher    Row  Name 10/01/24 1500 10/14/24 1100           Response to Exercise   Blood Pressure (Admit) 132/54 124/80      Blood Pressure (Exercise) 150/84 160/62      Blood Pressure (Exit) 118/70 162/70      Heart Rate (Admit) 64 bpm 57 bpm      Heart Rate (Exercise) 99 bpm 123 bpm      Heart Rate (Exit) 74 bpm 64 bpm      Rating of Perceived Exertion (Exercise) 12 12      Symptoms none none      Duration Continue with 30 min of aerobic exercise without signs/symptoms of physical distress. Continue with 30 min of aerobic exercise without signs/symptoms of physical distress.      Intensity THRR unchanged THRR unchanged        Progression   Progression Continue to progress workloads  to maintain intensity without signs/symptoms of physical distress. Continue to progress workloads to maintain intensity without signs/symptoms of physical distress.      Average METs 4.3 4.41        Resistance Training   Training Prescription Yes Yes      Weight 10 lb 10 lb      Reps 10-15 10-15        Interval Training   Interval Training No No        Treadmill   MPH 2.5 2.7      Grade 1 1.5      Minutes 15 15      METs 3.26 3.63        REL-XR   Level 9 10      Minutes 15 15      METs 5.8 5.5        Rower   Level 10 10      Watts 47 53      Minutes 15 15      METs 5 5.15        Oxygen   Maintain Oxygen Saturation 88% or higher 88% or higher         Exercise Comments:   Exercise Comments     Row Name 07/04/24 0910           Exercise Comments First full day of exercise!  Patient was oriented to gym and equipment including functions, settings, policies, and procedures.  Patient's individual exercise prescription and treatment plan were reviewed.  All starting workloads were established based on the results of the 6 minute walk test done at initial orientation visit.  The plan for exercise progression was also introduced and progression will be customized based on patient's performance and goals.          Exercise Goals and Review:   Exercise Goals     Row Name 06/27/24 1015             Exercise Goals   Increase Physical Activity Yes       Intervention Provide advice, education, support and counseling about physical activity/exercise needs.;Develop an individualized exercise prescription for aerobic and resistive training based on initial evaluation findings, risk stratification, comorbidities and participant's personal goals.       Expected Outcomes Short Term: Attend rehab on a regular basis to increase amount of physical activity.;Long Term: Add in home exercise to make exercise part of routine and to increase amount of physical activity.;Long Term:  Exercising regularly at least 3-5 days a week.       Increase  Strength and Stamina Yes       Intervention Provide advice, education, support and counseling about physical activity/exercise needs.;Develop an individualized exercise prescription for aerobic and resistive training based on initial evaluation findings, risk stratification, comorbidities and participant's personal goals.       Expected Outcomes Short Term: Increase workloads from initial exercise prescription for resistance, speed, and METs.;Short Term: Perform resistance training exercises routinely during rehab and add in resistance training at home;Long Term: Improve cardiorespiratory fitness, muscular endurance and strength as measured by increased METs and functional capacity ( )       Able to understand and use rate of perceived exertion (RPE) scale Yes       Intervention Provide education and explanation on how to use RPE scale       Expected Outcomes Short Term: Able to use RPE daily in rehab to express subjective intensity level;Long Term:  Able to use RPE to guide intensity level when exercising independently       Able to understand and use Dyspnea scale Yes       Intervention Provide education and explanation on how to use Dyspnea scale       Expected Outcomes Short Term: Able to use Dyspnea scale daily in rehab to express subjective sense of shortness of breath during exertion;Long Term: Able to use Dyspnea scale to guide intensity level when exercising independently       Knowledge and understanding of Target Heart Rate Range (THRR) Yes       Intervention Provide education and explanation of THRR including how the numbers were predicted and where they are located for reference       Expected Outcomes Short Term: Able to state/look up THRR;Short Term: Able to use daily as guideline for intensity in rehab;Long Term: Able to use THRR to govern intensity when exercising independently       Able to check pulse independently Yes        Intervention Provide education and demonstration on how to check pulse in carotid and radial arteries.;Review the importance of being able to check your own pulse for safety during independent exercise       Expected Outcomes Short Term: Able to explain why pulse checking is important during independent exercise;Long Term: Able to check pulse independently and accurately       Understanding of Exercise Prescription Yes       Intervention Provide education, explanation, and written materials on patient's individual exercise prescription       Expected Outcomes Short Term: Able to explain program exercise prescription;Long Term: Able to explain home exercise prescription to exercise independently          Exercise Goals Re-Evaluation :  Exercise Goals Re-Evaluation     Row Name 07/04/24 0911 07/09/24 1514 07/23/24 0934 08/06/24 1423 08/22/24 1117     Exercise Goal Re-Evaluation   Exercise Goals Review Increase Physical Activity;Able to understand and use rate of perceived exertion (RPE) scale;Knowledge and understanding of Target Heart Rate Range (THRR);Understanding of Exercise Prescription;Increase Strength and Stamina;Able to understand and use Dyspnea scale;Able to check pulse independently Increase Physical Activity;Increase Strength and Stamina;Understanding of Exercise Prescription Increase Physical Activity;Increase Strength and Stamina;Understanding of Exercise Prescription Increase Physical Activity;Increase Strength and Stamina;Understanding of Exercise Prescription Increase Physical Activity;Increase Strength and Stamina;Understanding of Exercise Prescription   Comments Reviewed RPE and dyspnea scale, THR and program prescription with pt today.  Pt voiced understanding and was given a copy of goals to take home. Velinda is off to  a good start in the program. He did well on the treadmill at a speed of 2.5 mph with no incline. He also did well on the T4 nustep at level 6. We will continue to  monitor his progress in the program. Velinda has only attended rehab once since the last review. During his one session he did increase his treadmill speed to 2.7 mph with no incline. He also improved to level 5 on the T5 nustep. We will continue to monitor his progress in the program. Velinda has only attended two sessions of rehab since the last review. During his one session he did increase his treadmill speed to 2.8 mph with no incline. He also improved to level 4 on the XR. We will continue to monitor his progress in the program. Velinda has not attended rehab since the last review as he has been placed on medical hold. He informed staff that he will be out of town and then he will have surgery. We will continue to monitor his progress when he returns to the program.   Expected Outcomes Short: Use RPE daily to regulate intensity.  Long: Follow program prescription in THR. Short: Continue to follow current exercise prescription. Long: Continue exercise to improve strength and stamina. Short: Attend rehab more consistently. Long: Continue exercise to improve strength and stamina. Short: Attend rehab more consistently. Long: Continue exercise to improve strength and stamina. Short: Return to rehab when appropriate. Long: Graduate.    Row Name 09/04/24 1129 09/17/24 1429 10/01/24 1526 10/08/24 0949 10/14/24 1130     Exercise Goal Re-Evaluation   Exercise Goals Review Increase Physical Activity;Increase Strength and Stamina;Understanding of Exercise Prescription Increase Physical Activity;Increase Strength and Stamina;Understanding of Exercise Prescription Increase Physical Activity;Increase Strength and Stamina;Understanding of Exercise Prescription -- Increase Physical Activity;Increase Strength and Stamina;Understanding of Exercise Prescription   Comments Velinda has not attended rehab since the last review as he continues to be on medical hold. He plans to return to the program on 09/12/2024. We will continue to monitor  his progress when he returns to the program. Tim returned to rehab for one session since the last review. He did well on the treadmill at 2.5 mph with no incline. He also began using the rower at level 10. We will continue to monitor his progress in the program. Tim only attended two sessions of rehab since the last review. He increased his treadmill workload by adding a 1% incline while maintaining a speed of 2.5 mph. He also improved to level 9 on the XR. We will continue to monitor his progress in the program. Velinda is walking some at home as well as attending rehab. Encouraged him to attend rehab regularly. Velinda is doing well in rehab. He increased his workload on the treadmill to a speed of 2.7 mph and 1.5% incline. He increased to level 10 on the XR. He also increased his average watts on the rower to 53 watts at level 10. We will continue to monitor his progress in the program.   Expected Outcomes Short: Return to rehab when appropriate. Long: Graduate. Short: Attend rehab more consistently. Long: Continue exercise to improve strength and stamina. Short: Attend rehab more consistently. Long: Continue exercise to improve strength and stamina. STG: Attend rehab more consistnetly and increase workload as able. LTG: Continue exercise to improve strength and stamina. Short: Continue to progressively increase treadmill workload. Long: Continue exercise to improve strength and stamina.      Discharge Exercise Prescription (Final Exercise Prescription Changes):  Exercise Prescription Changes - 10/14/24 1100       Response to Exercise   Blood Pressure (Admit) 124/80    Blood Pressure (Exercise) 160/62    Blood Pressure (Exit) 162/70    Heart Rate (Admit) 57 bpm    Heart Rate (Exercise) 123 bpm    Heart Rate (Exit) 64 bpm    Rating of Perceived Exertion (Exercise) 12    Symptoms none    Duration Continue with 30 min of aerobic exercise without signs/symptoms of physical distress.    Intensity THRR  unchanged      Progression   Progression Continue to progress workloads to maintain intensity without signs/symptoms of physical distress.    Average METs 4.41      Resistance Training   Training Prescription Yes    Weight 10 lb    Reps 10-15      Interval Training   Interval Training No      Treadmill   MPH 2.7    Grade 1.5    Minutes 15    METs 3.63      REL-XR   Level 10    Minutes 15    METs 5.5      Rower   Level 10    Watts 53    Minutes 15    METs 5.15      Oxygen   Maintain Oxygen Saturation 88% or higher          Nutrition:  Target Goals: Understanding of nutrition guidelines, daily intake of sodium 1500mg , cholesterol 200mg , calories 30% from fat and 7% or less from saturated fats, daily to have 5 or more servings of fruits and vegetables.  Education: Nutrition 1 -Group instruction provided by verbal, written material, interactive activities, discussions, models, and posters to present general guidelines for heart healthy nutrition including macronutrients, label reading, and promoting whole foods over processed counterparts. Education serves as pensions consultant of discussion of heart healthy eating for all. Written material provided at class time.    Education: Nutrition 2 -Group instruction provided by verbal, written material, interactive activities, discussions, models, and posters to present general guidelines for heart healthy nutrition including sodium, cholesterol, and saturated fat. Providing guidance of habit forming to improve blood pressure, cholesterol, and body weight. Written material provided at class time.     Biometrics:  Pre Biometrics - 06/27/24 1015       Pre Biometrics   Height 6' 1.7 (1.872 m)    Weight 240 lb 8 oz (109.1 kg)    Waist Circumference 46.3 inches    Hip Circumference 43 inches    Waist to Hip Ratio 1.08 %    BMI (Calculated) 31.13    Single Leg Stand 10 seconds           Nutrition Therapy Plan and  Nutrition Goals:  Nutrition Therapy & Goals - 06/27/24 1017       Nutrition Therapy   RD appointment deferred Yes      Personal Nutrition Goals   Nutrition Goal RD appointment deferred at this time      Intervention Plan   Intervention Prescribe, educate and counsel regarding individualized specific dietary modifications aiming towards targeted core components such as weight, hypertension, lipid management, diabetes, heart failure and other comorbidities.    Expected Outcomes Short Term Goal: Understand basic principles of dietary content, such as calories, fat, sodium, cholesterol and nutrients.          Nutrition Assessments:  MEDIFICTS Score Key: >=70 Need to  make dietary changes  40-70 Heart Healthy Diet <= 40 Therapeutic Level Cholesterol Diet  Flowsheet Row Cardiac Rehab from 06/27/2024 in [facility] Cardiac and Pulmonary Rehab  Picture Your Plate Total Score on Admission 60   Picture Your Plate Scores: <59 Unhealthy dietary pattern with much room for improvement. 41-50 Dietary pattern unlikely to meet recommendations for good health and room for improvement. 51-60 More healthful dietary pattern, with some room for improvement.  >60 Healthy dietary pattern, although there may be some specific behaviors that could be improved.    Nutrition Goals Re-Evaluation:  Nutrition Goals Re-Evaluation     Row Name 10/08/24 0954 10/31/24 0946           Goals   Comment RD appointment deferred at this time RD appointment deferred at this time         Nutrition Goals Discharge (Final Nutrition Goals Re-Evaluation):  Nutrition Goals Re-Evaluation - 10/31/24 0946       Goals   Comment RD appointment deferred at this time          Psychosocial: Target Goals: Acknowledge presence or absence of significant depression and/or stress, maximize coping skills, provide positive support system. Participant is able to verbalize types and ability to use techniques and skills needed for  reducing stress and depression.   Education: Stress, Anxiety, and Depression - Group verbal and visual presentation to define topics covered.  Reviews how body is impacted by stress, anxiety, and depression.  Also discusses healthy ways to reduce stress and to treat/manage anxiety and depression. Written material provided at class time.   Education: Sleep Hygiene -Provides group verbal and written instruction about how sleep can affect your health.  Define sleep hygiene, discuss sleep cycles and impact of sleep habits. Review good sleep hygiene tips.   Initial Review & Psychosocial Screening:  Initial Psych Review & Screening - 06/26/24 0957       Initial Review   Current issues with None Identified      Family Dynamics   Good Support System? 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 Tim stated that he does not have any stressors and has a good support system of his wife and family. Patient stated he particularly enjoys his time with his 10 grandchildren.Tim was pleasant to speak with and stated that he enjoys yard work and remains active. The patient stated he does have back pain/issues, but other than that manages well with his physical and mental health.      Barriers   Psychosocial barriers to participate in program The patient should benefit from training in stress management and relaxation.      Screening Interventions   Interventions To provide support and resources with identified psychosocial needs;Provide feedback about the scores to participant;Encouraged to exercise    Expected Outcomes Short Term goal: Identification and review with participant of any Quality of Life or Depression concerns found by scoring the questionnaire.;Long Term goal: The participant improves quality of Life and PHQ9 Scores as seen by post scores and/or verbalization of changes          Quality of Life Scores:   Quality of  Life - 06/27/24 1017       Quality of Life   Select Quality of Life      Quality of Life Scores   Health/Function Pre 12.73 %    Socioeconomic Pre 12.69 %    Psych/Spiritual Pre 12.79 %  Family Pre 13.1 %    GLOBAL Pre 12.79 %         Scores of 19 and below usually indicate a poorer quality of life in these areas.  A difference of  2-3 points is a clinically meaningful difference.  A difference of 2-3 points in the total score of the Quality of Life Index has been associated with significant improvement in overall quality of life, self-image, physical symptoms, and general health in studies assessing change in quality of life.  PHQ-9: Review Flowsheet  More data exists      06/27/2024 02/12/2024 10/05/2023 09/15/2023 09/01/2023  Depression screen PHQ 2/9  Decreased Interest 0 0 0 0 0  Down, Depressed, Hopeless 0 0 0 0 0  PHQ - 2 Score 0 0 0 0 0  Altered sleeping 0 0 0 0 0  Tired, decreased energy 0 1 0 0 0  Change in appetite 0 0 0 0 0  Feeling bad or failure about yourself  0 0 0 0 0  Trouble concentrating 0 0 0 0 0  Moving slowly or fidgety/restless 0 0 0 0 0  Suicidal thoughts 0 0 0 0 0  PHQ-9 Score 0  1  0  0  0   Difficult doing work/chores Not difficult at all Not difficult at all Not difficult at all Not difficult at all Not difficult at all    Details       Data saved with a previous flowsheet row definition        Interpretation of Total Score  Total Score Depression Severity:  1-4 = Minimal depression, 5-9 = Mild depression, 10-14 = Moderate depression, 15-19 = Moderately severe depression, 20-27 = Severe depression   Psychosocial Evaluation and Intervention:  Psychosocial Evaluation - 06/26/24 0958       Psychosocial Evaluation & Interventions   Interventions Stress management education;Relaxation education;Encouraged to exercise with the program and follow exercise prescription    Comments 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 Tim stated that he does not have any stressors and has a good support system of his wife and family. Patient stated he particularly enjoys his time with his 10 grandchildren.Tim was pleasant to speak with and stated that he enjoys yard work and remains active. The patient stated he does have back pain/issues, but other than that manages well with his physical and mental health.    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 He esitmates he gets 6hr of sleep most nights. He denies any stress, anxiety or depression at this time. Patient reports no issues with their current mental states, sleep, stress, depression or anxiety. Will follow up with patient in a few weeks for any changes.      Expected Outcomes STG: Continue to attend rehab and practice good sleep hygiene. LTG: Achieve and maintain positive outlook on health and daily life Short: Continue to exercise  regularly to support mental health and notify staff of any changes. Long: maintain mental health and well being through teaching of rehab or prescribed medications independently.      Interventions -- Encouraged to attend Cardiac Rehabilitation for the exercise      Continue Psychosocial Services  Follow up required by staff Follow up required by staff         Psychosocial Discharge (Final Psychosocial Re-Evaluation):  Psychosocial Re-Evaluation - 10/31/24 0945       Psychosocial Re-Evaluation   Current issues with None Identified     Comments Patient reports no issues with their current mental states, sleep, stress, depression or anxiety. Will follow up with patient in a few weeks for any changes.    Expected Outcomes Short: Continue to exercise regularly to support mental health and notify staff of any changes. Long: maintain mental health and well being through teaching of rehab or prescribed medications independently.    Interventions Encouraged to attend Cardiac Rehabilitation for the exercise    Continue Psychosocial Services  Follow up required by staff          Vocational Rehabilitation: Provide vocational rehab assistance to qualifying candidates.   Vocational Rehab Evaluation & Intervention:  Vocational Rehab - 06/26/24 0957       Initial Vocational Rehab Evaluation & Intervention   Assessment shows need for Vocational Rehabilitation No          Education: Education Goals: Education classes will be provided on a variety of topics geared toward better understanding of heart health and risk factor modification. Participant will state understanding/return demonstration of topics presented as noted by education test scores.  Learning Barriers/Preferences:  Learning Barriers/Preferences - 06/26/24 0957       Learning Barriers/Preferences   Learning Barriers None    Learning Preferences Group Instruction;Individual Instruction;Verbal Instruction;Video;Written Material          General Cardiac Education Topics:  AED/CPR: - Group verbal and written instruction with the use of models to demonstrate the basic use of the AED with the basic ABC's of resuscitation.   Test and Procedures: - Group verbal and visual presentation and models provide information about basic cardiac anatomy and function. Reviews the testing methods done to diagnose heart disease and the outcomes of the test results. Describes the treatment choices: Medical Management, Angioplasty, or Coronary Bypass Surgery for treating various  heart conditions including Myocardial Infarction, Angina, Valve Disease, and Cardiac Arrhythmias. Written material provided at class time.   Medication Safety: - Group verbal and visual instruction to review commonly prescribed medications for heart and lung disease. Reviews the medication, class of the drug, and side effects. Includes the steps to properly store meds and maintain the prescription regimen. Written material provided at class time.   Intimacy: - Group verbal instruction through game format to discuss how heart and lung disease can affect sexual intimacy. Written material provided at class time.   Know Your Numbers and Heart Failure: - Group verbal and visual instruction to discuss disease risk factors for cardiac and pulmonary disease and treatment options.  Reviews associated critical values for Overweight/Obesity, Hypertension, Cholesterol, and Diabetes.  Discusses basics of heart failure: signs/symptoms and treatments.  Introduces Heart Failure Zone chart for action plan for heart failure. Written material provided at class time.   Infection Prevention: - Provides verbal and written material to individual with discussion of infection control including proper hand washing and proper equipment cleaning during exercise session. Flowsheet Row Cardiac Rehab from 06/27/2024 in [facility]  Cardiac and Pulmonary Rehab  Date 06/27/24  Educator MB  Instruction Review Code 1- Verbalizes Understanding    Falls Prevention: - Provides verbal and written material to individual with discussion of falls prevention and safety. Flowsheet Row Cardiac Rehab from 06/27/2024 in [facility] Cardiac and Pulmonary Rehab  Date 06/27/24  Educator MB  Instruction Review Code 1- Verbalizes Understanding    Other: -Provides group and verbal instruction on various topics (see comments) Flowsheet Row Cardiac Rehab from 06/27/2024 in [facility] Cardiac and Pulmonary Rehab  Date 06/27/24  [facility] and Cardiac Procedures]     Knowledge Questionnaire Score:  Knowledge Questionnaire Score - 06/27/24 1018       Knowledge Questionnaire Score   Pre Score 23/26          Core Components/Risk Factors/Patient Goals at Admission:  Personal Goals and Risk Factors at Admission - 06/27/24 1019       Core Components/Risk Factors/Patient Goals on Admission    Weight Management Weight Loss;Yes    Intervention Weight Management: Develop a combined nutrition and exercise program designed to reach desired caloric intake, while maintaining appropriate intake of nutrient and fiber, sodium and fats, and appropriate energy expenditure required for the weight goal.;Weight Management: Provide education and appropriate resources to help participant work on and attain dietary goals.;Weight Management/Obesity: Establish reasonable short term and long term weight goals.    Admit Weight 240 lb 8 oz (109.1 kg)    Goal Weight: Short Term 230 lb (104.3 kg)    Goal Weight: Long Term 220 lb (99.8 kg)    Expected Outcomes Short Term: Continue to assess and modify interventions until short term weight is achieved;Long Term: Adherence to nutrition and physical activity/exercise program aimed toward attainment of established weight goal;Weight Loss: Understanding of general recommendations for a balanced deficit meal plan, which promotes 1-2 lb weight loss per week and includes a negative energy balance of [phone] kcal/d;Understanding recommendations for meals to include 15-35% energy as protein, 25-35% energy from fat, 35-60% energy from carbohydrates, less than 200mg  of dietary cholesterol, 20-35 gm of total fiber daily;Understanding of distribution of calorie intake throughout the day with the consumption of 4-5 meals/snacks    Hypertension Yes    Intervention Provide education on lifestyle modifcations including regular physical activity/exercise, weight management, moderate sodium restriction and increased consumption of fresh fruit,  vegetables, and low fat dairy, alcohol moderation, and smoking cessation.;Monitor prescription use compliance.    Expected Outcomes Short Term: Continued assessment and intervention until BP is < 140/34mm HG in hypertensive participants. < 130/52mm HG in hypertensive participants with diabetes, heart failure or chronic kidney disease.;Long Term: Maintenance of blood pressure at goal levels.    Lipids Yes    Intervention Provide education and support for participant on nutrition & aerobic/resistive exercise along with prescribed medications to achieve LDL 70mg , HDL >40mg .    Expected Outcomes Short Term: Participant states understanding of desired cholesterol values and is compliant with medications prescribed. Participant is following exercise prescription and nutrition guidelines.;Long Term: Cholesterol controlled with medications as prescribed, with individualized exercise RX and with personalized nutrition plan. Value goals: LDL < 70mg , HDL > 40 mg.          Education:Diabetes - Individual verbal and written instruction to review signs/symptoms of diabetes, desired ranges of glucose level fasting, after meals and with exercise. Acknowledge that pre and post exercise glucose checks will be done for 3 sessions at entry of program.   Core Components/Risk Factors/Patient Goals Review:   Goals and  Risk Factor Review     Row Name 10/08/24 0954 10/31/24 0947           Core Components/Risk Factors/Patient Goals Review   Personal Goals Review Weight Management/Obesity Hypertension;Lipids;Weight Management/Obesity      Review Tim reports he is still wanting to lose weight, he says the holidays have made it tough to stay on track. He has had bigger meals and more desserts than he normally would. Tim states that he wants to lose weight and now that the holidays are over he will be able to lose a few pounds. He wants to get down to 230 pounds. His blood pressure is doing well and will start checking  his BP at home. Lipds are doing well and his total cholesterol is 83. He is taking his medications for cholesterol and has no questions about them.      Expected Outcomes STG: Focus on getting eating habits back on track by new year. LTG: Manage risk factors independently Short: lose a few pounds in the next couple weeks. Long: reach weight goal.         Core Components/Risk Factors/Patient Goals at Discharge (Final Review):   Goals and Risk Factor Review - 10/31/24 0947       Core Components/Risk Factors/Patient Goals Review   Personal Goals Review Hypertension;Lipids;Weight Management/Obesity    Review Velinda states that he wants to lose weight and now that the holidays are over he will be able to lose a few pounds. He wants to get down to 230 pounds. His blood pressure is doing well and will start checking his BP at home. Lipds are doing well and his total cholesterol is 83. He is taking his medications for cholesterol and has no questions about them.    Expected Outcomes Short: lose a few pounds in the next couple weeks. Long: reach weight goal.          ITP Comments:  ITP Comments     Row Name 06/26/24 1001 06/27/24 1006 07/04/24 0910 07/17/24 1001 08/14/24 0825   ITP Comments Initial phone call completed. Diagnosis can be found in [facility] 06/12/2024. EP Orientation scheduled for Thursday, June 27, 2024, @ 0800. Completed and gym orientation for cardiac rehab. Initial ITP created and sent for review to Dr. Oneil Pinal, Medical Director. First full day of exercise!  Patient was oriented to gym and equipment including functions, settings, policies, and procedures.  Patient's individual exercise prescription and treatment plan were reviewed.  All starting workloads were established based on the results of the 6 minute walk test done at initial orientation visit.  The plan for exercise progression was also introduced and progression will be customized based on patient's performance and  goals. 30 Day review completed. Medical Director ITP review done, changes made as directed, and signed approval by Medical Director. New to program. 30 Day review completed. Medical Director ITP review done, changes made as directed, and signed approval by Medical Director.    Row Name 09/11/24 [phone] 10/09/24 0949 11/06/24 0757       ITP Comments 30 Day review completed. Medical Director ITP review done, changes made as directed, and signed approval by Medical Director. 30 Day review completed. Medical Director ITP review done, changes made as directed, and signed approval by Medical Director. 30 Day review completed. Medical Director ITP review done, changes made as directed, and signed approval by Medical Director.        Comments: 30 day review     [1]  Current Outpatient Medications:    acetaminophen  (TYLENOL ) 325 MG tablet, Take 650 mg by mouth every 6 (six) hours as needed for moderate pain (pain score 4-6)., Disp: , Rfl:    amiodarone (PACERONE) 200 MG tablet, Take 200 mg by mouth 2 (two) times daily., Disp: , Rfl:    apixaban  (ELIQUIS ) 5 MG TABS tablet, Take 1 tablet (5 mg total) by mouth 2 (two) times daily. Restart Wednesday, November 16 (Patient taking differently: Take 5 mg by mouth 2 (two) times daily.), Disp: 60 tablet, Rfl: 0   celecoxib  (CELEBREX ) 200 MG capsule, Take 200 mg by mouth daily. (Patient taking differently: Take 200 mg by mouth in the morning.), Disp: , Rfl:    clopidogrel (PLAVIX) 75 MG tablet, Take 75 mg by mouth in the morning., Disp: , Rfl:    diltiazem  (CARDIZEM  CD) 180 MG 24 hr capsule, Take 1 capsule (180 mg total) by mouth 2 (two) times daily. (Patient taking differently: Take 180 mg by mouth in the morning.), Disp: 180 capsule, Rfl: 1   ezetimibe (ZETIA) 10 MG tablet, Take 10 mg by mouth at bedtime., Disp: , Rfl:    fexofenadine  (ALLEGRA ) 180 MG tablet, Take 1 tablet (180 mg total) by mouth daily., Disp: 90 tablet, Rfl: 1   fluticasone  (FLONASE ) 50 MCG/ACT  nasal spray, Place 2 sprays into both nostrils daily. (Patient taking differently: Place 2 sprays into both nostrils daily as needed for allergies or rhinitis.), Disp: 18 mL, Rfl: 5   methocarbamol  (ROBAXIN ) 500 MG tablet, Take 500 mg by mouth daily as needed for muscle spasms., Disp: , Rfl:    metoprolol  succinate (TOPROL -XL) 50 MG 24 hr tablet, Take 1 tablet (50 mg total) by mouth daily. cardio (Patient taking differently: Take 50 mg by mouth 2 (two) times daily. cardio), Disp: 90 tablet, Rfl: 1   montelukast  (SINGULAIR ) 10 MG tablet, Take 1 tablet (10 mg total) by mouth at bedtime., Disp: 90 tablet, Rfl: 1   nitroGLYCERIN (NITROSTAT) 0.4 MG SL tablet, Place 0.4 mg under the tongue every 5 (five) minutes as needed for chest pain., Disp: , Rfl:    pantoprazole  (PROTONIX ) 40 MG tablet, Take 1 tablet (40 mg total) by mouth every evening. (Patient taking differently: Take 40 mg by mouth in the morning.), Disp: 90 tablet, Rfl: 1   potassium chloride  SA (KLOR-CON  M20) 20 MEQ tablet, Take 1 tablet (20 mEq total) by mouth 3 (three) times daily. (Patient taking differently: Take 20-40 mEq by mouth See admin instructions. Take 20 meq in the morning and 40 meq in the evening), Disp: 270 tablet, Rfl: 1   rosuvastatin  (CRESTOR ) 5 MG tablet, TAKE 1 TABLET BY MOUTH THREE TIMES A WEEK (Patient taking differently: Take 5 mg by mouth See admin instructions. TAKE 1 TABLET BY MOUTH THREE TIMES A WEEK Sun, Tues, and  thurs, in the evening), Disp: 90 tablet, Rfl: 1   tadalafil  (CIALIS ) 10 MG tablet, Take 1-2 tablets (10-20 mg total) by mouth daily as needed for erectile dysfunction (Take 1 hour prior to sexual activity). Okay to take an additional 10 mg boost dose as needed 45 minutes prior to sexual activity, Disp: 90 tablet, Rfl: 3   tamsulosin  (FLOMAX ) 0.4 MG CAPS capsule, Take 1 capsule (0.4 mg total) by mouth daily., Disp: 90 capsule, Rfl: 3   traMADol  (ULTRAM ) 50 MG tablet, Take 50 mg by mouth every 6 (six) hours as  needed., Disp: , Rfl:    triamterene-hydrochlorothiazide  (MAXZIDE-25) 37.5-25 MG tablet, Take 0.5 tablets  by mouth in the morning., Disp: , Rfl:    valsartan  (DIOVAN ) 320 MG tablet, Take 1 tablet (320 mg total) by mouth daily. (Patient taking differently: Take 160 mg by mouth at bedtime.), Disp: 90 tablet, Rfl: 1 [2]  Social History Tobacco Use  Smoking Status Never  Smokeless Tobacco Former   Types: Chew   Quit date: 03/2020

## 2024-11-07 ENCOUNTER — Encounter: Admitting: Emergency Medicine

## 2024-11-07 DIAGNOSIS — Z48812 Encounter for surgical aftercare following surgery on the circulatory system: Secondary | ICD-10-CM | POA: Diagnosis not present

## 2024-11-07 DIAGNOSIS — Z955 Presence of coronary angioplasty implant and graft: Secondary | ICD-10-CM

## 2024-11-07 NOTE — Progress Notes (Signed)
 Daily Session Note  Patient Details  Name: [name] MRN: [id number] Date of Birth: August 11, 1956 Referring Provider:   Flowsheet Row Cardiac Rehab from 06/27/2024 in [facility] Cardiac and Pulmonary Rehab  Referring Provider Florencio Kava, MD    Encounter Date: 11/07/2024  Check In:  Session Check In - 11/07/24 0932       Check-In   Supervising physician immediately available to respond to emergencies See telemetry face sheet for immediately available ER MD    Location [facility]-Cardiac & Pulmonary Rehab    Staff Present Leita Franks RN,BSN;[facility] BS, Exercise Physiologist;Noah Tickle, BS, Exercise Physiologist    Virtual Visit No    Medication changes reported     No    Fall or balance concerns reported    No    Warm-up and Cool-down Performed on first and last piece of equipment    Resistance Training Performed Yes    VAD Patient? No    PAD/SET Patient? No      Pain Assessment   Currently in Pain? No/denies             Tobacco Use History[1]  Goals Met:  Independence with exercise equipment Exercise tolerated well No report of concerns or symptoms today Strength training completed today  Goals Unmet:  Not Applicable  Comments: Pt able to follow exercise prescription today without complaint.  Will continue to monitor for progression.    Dr. Oneil Pinal is Medical Director for [facility] Cardiac Rehabilitation.  Dr. Fuad Aleskerov is Medical Director for [facility] Pulmonary Rehabilitation.    [1]  Social History Tobacco Use  Smoking Status Never  Smokeless Tobacco Former   Types: Chew   Quit date: 03/2020

## 2024-11-12 ENCOUNTER — Encounter

## 2024-11-12 DIAGNOSIS — Z48812 Encounter for surgical aftercare following surgery on the circulatory system: Secondary | ICD-10-CM | POA: Diagnosis not present

## 2024-11-12 DIAGNOSIS — Z955 Presence of coronary angioplasty implant and graft: Secondary | ICD-10-CM

## 2024-11-12 NOTE — Progress Notes (Signed)
 Daily Session Note  Patient Details  Name: [name] MRN: [id number] Date of Birth: October 29, 1955 Referring Provider:   Flowsheet Row Cardiac Rehab from 06/27/2024 in [facility] Cardiac and Pulmonary Rehab  Referring Provider Florencio Kava, MD    Encounter Date: 11/12/2024  Check In:  Session Check In - 11/12/24 9076       Check-In   Supervising physician immediately available to respond to emergencies See telemetry face sheet for immediately available ER MD    Location [facility]-Cardiac & Pulmonary Rehab    Staff Present Burnard Davenport RN,BSN,MPA;Maxon Conetta BS, Exercise Physiologist;Margaret Best, MS, Exercise Physiologist;Jason Elnor RDN,LDN    Virtual Visit No    Medication changes reported     No    Fall or balance concerns reported    No    Warm-up and Cool-down Performed on first and last piece of equipment    Resistance Training Performed Yes    VAD Patient? No    PAD/SET Patient? No      Pain Assessment   Currently in Pain? No/denies             Tobacco Use History[1]  Goals Met:  Independence with exercise equipment Exercise tolerated well No report of concerns or symptoms today Strength training completed today  Goals Unmet:  Not Applicable  Comments: Pt able to follow exercise prescription today without complaint.  Will continue to monitor for progression.    Dr. Oneil Pinal is Medical Director for [facility] Cardiac Rehabilitation.  Dr. Fuad Aleskerov is Medical Director for [facility] Pulmonary Rehabilitation.    [1]  Social History Tobacco Use  Smoking Status Never  Smokeless Tobacco Former   Types: Chew   Quit date: 03/2020

## 2024-11-14 ENCOUNTER — Encounter

## 2024-11-19 ENCOUNTER — Encounter

## 2024-11-19 NOTE — Progress Notes (Signed)
 Jennefer Ester PARAS, MD sent to Carlie Hoose S PROCEDURE / BIOPSY REVIEW Date: 11/15/24  Requested Biopsy site: Right axillary node Reason for request: lymphadenopathy, no known primary Imaging review: Best seen on CT CAP 11/04/24  Decision: Approved Imaging modality to perform: Ultrasound Schedule with: No sedation / Local anesthetic Schedule for: Any VIR  Additional comments: none  Please contact me with questions, concerns, or if issue pertaining to this request arise.  Ester PARAS Jennefer, MD [facility] Radiology

## 2024-11-20 ENCOUNTER — Other Ambulatory Visit: Payer: Self-pay | Admitting: Internal Medicine

## 2024-11-20 DIAGNOSIS — R590 Localized enlarged lymph nodes: Secondary | ICD-10-CM

## 2024-11-21 ENCOUNTER — Encounter

## 2024-11-26 ENCOUNTER — Ambulatory Visit
Admission: RE | Admit: 2024-11-26 | Discharge: 2024-11-26 | Disposition: A | Source: Ambulatory Visit | Attending: Internal Medicine | Admitting: Internal Medicine

## 2024-11-26 ENCOUNTER — Encounter: Attending: Internal Medicine

## 2024-11-26 DIAGNOSIS — R590 Localized enlarged lymph nodes: Secondary | ICD-10-CM

## 2024-11-26 DIAGNOSIS — Z955 Presence of coronary angioplasty implant and graft: Secondary | ICD-10-CM

## 2024-11-26 MED ORDER — LIDOCAINE HCL (PF) 1 % IJ SOLN
10.0000 mL | Freq: Once | INTRAMUSCULAR | Status: AC
  Administered 2024-11-26: 10 mL via INTRADERMAL

## 2024-11-26 NOTE — Procedures (Signed)
 Interventional Radiology Procedure Note  Procedure: US  CORE BX RT AXILLARY ADENOPATHY    Complications: None  Estimated Blood Loss:  MIN  Findings: 18 G CORES ON SALINE TELFA    Dayne Even, MD

## 2024-11-26 NOTE — Progress Notes (Signed)
 Daily Session Note  Patient Details  Name: [name] MRN: [id number] Date of Birth: Mar 21, 1956 Referring Provider:   Flowsheet Row Cardiac Rehab from 06/27/2024 in [facility] Cardiac and Pulmonary Rehab  Referring Provider Florencio Kava, MD    Encounter Date: 11/26/2024  Check In:  Session Check In - 11/26/24 0910       Check-In   Supervising physician immediately available to respond to emergencies See telemetry face sheet for immediately available ER MD    Location [facility]-Cardiac & Pulmonary Rehab    Staff Present Burnard Davenport RN,BSN,MPA;Maxon Conetta BS, Exercise Physiologist;Margaret Best, MS, Exercise Physiologist;Noah Tickle, BS, Exercise Physiologist    Virtual Visit No    Medication changes reported     No    Fall or balance concerns reported    No    Warm-up and Cool-down Performed on first and last piece of equipment    Resistance Training Performed Yes    VAD Patient? No    PAD/SET Patient? No      Pain Assessment   Currently in Pain? No/denies             Tobacco Use History[1]  Goals Met:  Independence with exercise equipment Exercise tolerated well No report of concerns or symptoms today Strength training completed today  Goals Unmet:  Not Applicable  Comments: Pt able to follow exercise prescription today without complaint.  Will continue to monitor for progression.    Dr. Oneil Pinal is Medical Director for [facility] Cardiac Rehabilitation.  Dr. Fuad Aleskerov is Medical Director for [facility] Pulmonary Rehabilitation.    [1]  Social History Tobacco Use  Smoking Status Never  Smokeless Tobacco Former   Types: Chew   Quit date: 03/2020

## 2024-11-28 ENCOUNTER — Encounter: Admitting: Emergency Medicine

## 2024-11-28 DIAGNOSIS — Z955 Presence of coronary angioplasty implant and graft: Secondary | ICD-10-CM

## 2024-11-28 NOTE — Progress Notes (Signed)
 Daily Session Note  Patient Details  Name: [name] MRN: [id number] Date of Birth: 24-Jul-1956 Referring Provider:   Flowsheet Row Cardiac Rehab from 06/27/2024 in [facility] Cardiac and Pulmonary Rehab  Referring Provider Florencio Kava, MD    Encounter Date: 11/28/2024  Check In:  Session Check In - 11/28/24 9061       Check-In   Supervising physician immediately available to respond to emergencies See telemetry face sheet for immediately available ER MD    Location [facility]-Cardiac & Pulmonary Rehab    Staff Present Leita Franks RN,BSN;[facility] BS, Exercise Physiologist;Noah Tickle, BS, Exercise Physiologist    Virtual Visit No    Medication changes reported     No    Fall or balance concerns reported    No    Warm-up and Cool-down Performed on first and last piece of equipment    Resistance Training Performed Yes    VAD Patient? No    PAD/SET Patient? No      Pain Assessment   Currently in Pain? No/denies             Tobacco Use History[1]  Goals Met:  Independence with exercise equipment Exercise tolerated well No report of concerns or symptoms today Strength training completed today  Goals Unmet:  Not Applicable  Comments: Pt able to follow exercise prescription today without complaint.  Will continue to monitor for progression.    Dr. Oneil Pinal is Medical Director for [facility] Cardiac Rehabilitation.  Dr. Fuad Aleskerov is Medical Director for [facility] Pulmonary Rehabilitation.    [1]  Social History Tobacco Use  Smoking Status Never  Smokeless Tobacco Former   Types: Chew   Quit date: 03/2020

## 2024-11-29 LAB — SURGICAL PATHOLOGY

## 2024-12-03 ENCOUNTER — Encounter

## 2024-12-05 ENCOUNTER — Encounter

## 2024-12-10 ENCOUNTER — Encounter

## 2024-12-12 ENCOUNTER — Encounter

## 2024-12-17 ENCOUNTER — Encounter

## 2024-12-19 ENCOUNTER — Encounter

## 2025-11-05 ENCOUNTER — Ambulatory Visit: Admitting: Urology
